# Patient Record
Sex: Female | Born: 1940 | Race: White | Hispanic: No | Marital: Married | Smoking: Never smoker
Health system: Southern US, Community
[De-identification: ages and names within clinical notes are randomized; demographics above are authoritative.]

## PROBLEM LIST (undated history)

## (undated) DIAGNOSIS — F419 Anxiety disorder, unspecified: Secondary | ICD-10-CM

## (undated) DIAGNOSIS — R053 Chronic cough: Secondary | ICD-10-CM

## (undated) DIAGNOSIS — R112 Nausea with vomiting, unspecified: Secondary | ICD-10-CM

## (undated) DIAGNOSIS — Z9889 Other specified postprocedural states: Secondary | ICD-10-CM

## (undated) DIAGNOSIS — G43909 Migraine, unspecified, not intractable, without status migrainosus: Secondary | ICD-10-CM

## (undated) DIAGNOSIS — I82409 Acute embolism and thrombosis of unspecified deep veins of unspecified lower extremity: Secondary | ICD-10-CM

## (undated) DIAGNOSIS — T8859XA Other complications of anesthesia, initial encounter: Secondary | ICD-10-CM

## (undated) DIAGNOSIS — Z939 Artificial opening status, unspecified: Secondary | ICD-10-CM

## (undated) DIAGNOSIS — K5792 Diverticulitis of intestine, part unspecified, without perforation or abscess without bleeding: Secondary | ICD-10-CM

## (undated) DIAGNOSIS — K8020 Calculus of gallbladder without cholecystitis without obstruction: Secondary | ICD-10-CM

## (undated) DIAGNOSIS — J189 Pneumonia, unspecified organism: Secondary | ICD-10-CM

## (undated) DIAGNOSIS — M719 Bursopathy, unspecified: Secondary | ICD-10-CM

## (undated) DIAGNOSIS — L659 Nonscarring hair loss, unspecified: Secondary | ICD-10-CM

## (undated) DIAGNOSIS — M1990 Unspecified osteoarthritis, unspecified site: Secondary | ICD-10-CM

## (undated) DIAGNOSIS — O020 Blighted ovum and nonhydatidiform mole: Secondary | ICD-10-CM

## (undated) DIAGNOSIS — K219 Gastro-esophageal reflux disease without esophagitis: Secondary | ICD-10-CM

## (undated) DIAGNOSIS — M810 Age-related osteoporosis without current pathological fracture: Secondary | ICD-10-CM

## (undated) DIAGNOSIS — C4490 Unspecified malignant neoplasm of skin, unspecified: Secondary | ICD-10-CM

## (undated) DIAGNOSIS — N281 Cyst of kidney, acquired: Secondary | ICD-10-CM

## (undated) DIAGNOSIS — K439 Ventral hernia without obstruction or gangrene: Secondary | ICD-10-CM

## (undated) DIAGNOSIS — C801 Malignant (primary) neoplasm, unspecified: Secondary | ICD-10-CM

## (undated) DIAGNOSIS — J342 Deviated nasal septum: Secondary | ICD-10-CM

## (undated) DIAGNOSIS — Z87442 Personal history of urinary calculi: Secondary | ICD-10-CM

## (undated) DIAGNOSIS — H938X3 Other specified disorders of ear, bilateral: Secondary | ICD-10-CM

## (undated) DIAGNOSIS — T4145XA Adverse effect of unspecified anesthetic, initial encounter: Secondary | ICD-10-CM

## (undated) DIAGNOSIS — R05 Cough: Secondary | ICD-10-CM

## (undated) DIAGNOSIS — B009 Herpesviral infection, unspecified: Secondary | ICD-10-CM

## (undated) DIAGNOSIS — E559 Vitamin D deficiency, unspecified: Secondary | ICD-10-CM

## (undated) DIAGNOSIS — Z85828 Personal history of other malignant neoplasm of skin: Secondary | ICD-10-CM

## (undated) HISTORY — DX: Nonscarring hair loss, unspecified: L65.9

## (undated) HISTORY — PX: LIPOMA RESECTION: SHX23

## (undated) HISTORY — PX: TONSILLECTOMY: SUR1361

## (undated) HISTORY — DX: Artificial opening status, unspecified: Z93.9

## (undated) HISTORY — DX: Vitamin D deficiency, unspecified: E55.9

## (undated) HISTORY — PX: EYE SURGERY: SHX253

## (undated) HISTORY — DX: Herpesviral infection, unspecified: B00.9

## (undated) HISTORY — DX: Unspecified osteoarthritis, unspecified site: M19.90

## (undated) HISTORY — DX: Bursopathy, unspecified: M71.9

## (undated) HISTORY — DX: Blighted ovum and nonhydatidiform mole: O02.0

## (undated) HISTORY — DX: Migraine, unspecified, not intractable, without status migrainosus: G43.909

## (undated) HISTORY — DX: Acute embolism and thrombosis of unspecified deep veins of unspecified lower extremity: I82.409

---

## 1970-03-11 HISTORY — PX: ABDOMINAL HYSTERECTOMY: SHX81

## 1974-03-11 HISTORY — PX: OOPHORECTOMY: SHX86

## 1983-03-12 HISTORY — PX: RHINOPLASTY: SUR1284

## 1992-03-11 HISTORY — PX: REDUCTION MAMMAPLASTY: SUR839

## 1994-07-26 ENCOUNTER — Encounter: Payer: Self-pay | Admitting: Internal Medicine

## 1997-09-19 ENCOUNTER — Ambulatory Visit (HOSPITAL_COMMUNITY): Admission: RE | Admit: 1997-09-19 | Discharge: 1997-09-19 | Payer: Self-pay | Admitting: Gynecology

## 1997-10-04 ENCOUNTER — Other Ambulatory Visit: Admission: RE | Admit: 1997-10-04 | Discharge: 1997-10-04 | Payer: Self-pay | Admitting: Gynecology

## 1998-11-21 ENCOUNTER — Ambulatory Visit (HOSPITAL_COMMUNITY): Admission: RE | Admit: 1998-11-21 | Discharge: 1998-11-21 | Payer: Self-pay | Admitting: Gynecology

## 1998-11-21 ENCOUNTER — Encounter: Payer: Self-pay | Admitting: Gynecology

## 1998-12-11 ENCOUNTER — Other Ambulatory Visit: Admission: RE | Admit: 1998-12-11 | Discharge: 1998-12-11 | Payer: Self-pay | Admitting: Gynecology

## 1998-12-11 ENCOUNTER — Encounter: Payer: Self-pay | Admitting: Internal Medicine

## 1999-08-04 ENCOUNTER — Encounter: Admission: RE | Admit: 1999-08-04 | Discharge: 1999-08-04 | Payer: Self-pay | Admitting: Orthopedic Surgery

## 1999-08-04 ENCOUNTER — Encounter: Payer: Self-pay | Admitting: Orthopedic Surgery

## 1999-08-10 ENCOUNTER — Encounter (INDEPENDENT_AMBULATORY_CARE_PROVIDER_SITE_OTHER): Payer: Self-pay | Admitting: *Deleted

## 1999-08-10 ENCOUNTER — Ambulatory Visit (HOSPITAL_BASED_OUTPATIENT_CLINIC_OR_DEPARTMENT_OTHER): Admission: RE | Admit: 1999-08-10 | Discharge: 1999-08-10 | Payer: Self-pay | Admitting: Plastic Surgery

## 1999-10-11 ENCOUNTER — Encounter: Admission: RE | Admit: 1999-10-11 | Discharge: 1999-10-11 | Payer: Self-pay | Admitting: Neurology

## 1999-10-11 ENCOUNTER — Encounter: Payer: Self-pay | Admitting: Neurology

## 2000-01-28 ENCOUNTER — Ambulatory Visit (HOSPITAL_COMMUNITY): Admission: RE | Admit: 2000-01-28 | Discharge: 2000-01-28 | Payer: Self-pay | Admitting: Gynecology

## 2000-01-28 ENCOUNTER — Encounter: Payer: Self-pay | Admitting: Gynecology

## 2000-02-21 ENCOUNTER — Other Ambulatory Visit: Admission: RE | Admit: 2000-02-21 | Discharge: 2000-02-21 | Payer: Self-pay | Admitting: Gynecology

## 2001-01-30 ENCOUNTER — Ambulatory Visit (HOSPITAL_COMMUNITY): Admission: RE | Admit: 2001-01-30 | Discharge: 2001-01-30 | Payer: Self-pay | Admitting: Gynecology

## 2001-01-30 ENCOUNTER — Encounter: Payer: Self-pay | Admitting: Gynecology

## 2001-04-07 ENCOUNTER — Other Ambulatory Visit: Admission: RE | Admit: 2001-04-07 | Discharge: 2001-04-07 | Payer: Self-pay | Admitting: Gynecology

## 2001-05-11 ENCOUNTER — Encounter: Payer: Self-pay | Admitting: Gastroenterology

## 2001-05-11 ENCOUNTER — Ambulatory Visit (HOSPITAL_COMMUNITY): Admission: RE | Admit: 2001-05-11 | Discharge: 2001-05-11 | Payer: Self-pay | Admitting: Gastroenterology

## 2001-07-13 ENCOUNTER — Encounter: Payer: Self-pay | Admitting: Internal Medicine

## 2002-03-01 ENCOUNTER — Ambulatory Visit (HOSPITAL_COMMUNITY): Admission: RE | Admit: 2002-03-01 | Discharge: 2002-03-01 | Payer: Self-pay | Admitting: Gynecology

## 2002-03-01 ENCOUNTER — Encounter: Payer: Self-pay | Admitting: Gynecology

## 2002-06-30 ENCOUNTER — Other Ambulatory Visit: Admission: RE | Admit: 2002-06-30 | Discharge: 2002-06-30 | Payer: Self-pay | Admitting: Gynecology

## 2003-04-27 ENCOUNTER — Ambulatory Visit (HOSPITAL_COMMUNITY): Admission: RE | Admit: 2003-04-27 | Discharge: 2003-04-27 | Payer: Self-pay | Admitting: Gynecology

## 2003-05-18 ENCOUNTER — Encounter: Admission: RE | Admit: 2003-05-18 | Discharge: 2003-08-16 | Payer: Self-pay

## 2003-08-15 ENCOUNTER — Other Ambulatory Visit: Admission: RE | Admit: 2003-08-15 | Discharge: 2003-08-15 | Payer: Self-pay | Admitting: Gynecology

## 2003-08-24 ENCOUNTER — Emergency Department (HOSPITAL_COMMUNITY): Admission: EM | Admit: 2003-08-24 | Discharge: 2003-08-24 | Payer: Self-pay | Admitting: Emergency Medicine

## 2003-09-21 ENCOUNTER — Encounter: Payer: Self-pay | Admitting: Internal Medicine

## 2004-06-11 ENCOUNTER — Ambulatory Visit (HOSPITAL_COMMUNITY): Admission: RE | Admit: 2004-06-11 | Discharge: 2004-06-11 | Payer: Self-pay | Admitting: Gynecology

## 2004-10-02 ENCOUNTER — Other Ambulatory Visit: Admission: RE | Admit: 2004-10-02 | Discharge: 2004-10-02 | Payer: Self-pay | Admitting: Gynecology

## 2005-06-20 ENCOUNTER — Ambulatory Visit (HOSPITAL_COMMUNITY): Admission: RE | Admit: 2005-06-20 | Discharge: 2005-06-20 | Payer: Self-pay | Admitting: Gynecology

## 2005-10-15 ENCOUNTER — Other Ambulatory Visit: Admission: RE | Admit: 2005-10-15 | Discharge: 2005-10-15 | Payer: Self-pay | Admitting: Gynecology

## 2005-12-16 ENCOUNTER — Encounter: Payer: Self-pay | Admitting: Internal Medicine

## 2006-07-18 ENCOUNTER — Ambulatory Visit: Payer: Self-pay | Admitting: Internal Medicine

## 2006-08-21 ENCOUNTER — Ambulatory Visit (HOSPITAL_COMMUNITY): Admission: RE | Admit: 2006-08-21 | Discharge: 2006-08-21 | Payer: Self-pay | Admitting: Gynecology

## 2006-09-19 ENCOUNTER — Ambulatory Visit: Payer: Self-pay | Admitting: Internal Medicine

## 2006-10-21 ENCOUNTER — Other Ambulatory Visit: Admission: RE | Admit: 2006-10-21 | Discharge: 2006-10-21 | Payer: Self-pay | Admitting: Gynecology

## 2006-11-11 ENCOUNTER — Ambulatory Visit: Payer: Self-pay | Admitting: Internal Medicine

## 2006-11-12 ENCOUNTER — Ambulatory Visit: Payer: Self-pay | Admitting: Internal Medicine

## 2006-11-12 ENCOUNTER — Encounter: Payer: Self-pay | Admitting: Internal Medicine

## 2006-11-12 DIAGNOSIS — M810 Age-related osteoporosis without current pathological fracture: Secondary | ICD-10-CM | POA: Insufficient documentation

## 2006-11-12 LAB — CONVERTED CEMR LAB
ALT: 10 units/L (ref 0–35)
AST: 15 units/L (ref 0–37)
Alkaline Phosphatase: 77 units/L (ref 39–117)
Basophils Relative: 0.3 % (ref 0.0–1.0)
Bilirubin Urine: NEGATIVE
Bilirubin, Direct: 0.1 mg/dL (ref 0.0–0.3)
CO2: 31 meq/L (ref 19–32)
Cholesterol: 184 mg/dL (ref 0–200)
Creatinine, Ser: 0.7 mg/dL (ref 0.4–1.2)
Crystals: NEGATIVE
GFR calc Af Amer: 108 mL/min
GFR calc non Af Amer: 89 mL/min
HCT: 40.4 % (ref 36.0–46.0)
HDL: 58.8 mg/dL (ref >39.0)
Ketones, ur: NEGATIVE mg/dL
MCV: 87.5 fL (ref 78.0–100.0)
Monocytes Absolute: 0.7 10*3/uL (ref 0.2–0.7)
Monocytes Relative: 8.5 % (ref 3.0–11.0)
Mucus, UA: NEGATIVE
Neutrophils Relative %: 66.5 % (ref 43.0–77.0)
Nitrite: NEGATIVE
Platelets: 359 10*3/uL (ref 150–400)
RBC: 4.61 M/uL (ref 3.87–5.11)
RDW: 13.7 % (ref 11.5–14.6)
Specific Gravity, Urine: 1.015 (ref 1.000–1.03)
TSH: 3.3 microintl units/mL (ref 0.35–5.50)
Triglycerides: 95 mg/dL (ref 0–149)
Urine Glucose: NEGATIVE mg/dL
VLDL: 19 mg/dL (ref 0–40)
pH: 6 (ref 5.0–8.0)

## 2006-11-24 ENCOUNTER — Encounter: Payer: Self-pay | Admitting: Internal Medicine

## 2006-11-24 ENCOUNTER — Ambulatory Visit: Payer: Self-pay | Admitting: Internal Medicine

## 2007-03-12 HISTORY — PX: FACIAL COSMETIC SURGERY: SHX629

## 2007-03-25 ENCOUNTER — Encounter: Payer: Self-pay | Admitting: Internal Medicine

## 2007-03-31 ENCOUNTER — Encounter: Payer: Self-pay | Admitting: Internal Medicine

## 2007-03-31 DIAGNOSIS — M1990 Unspecified osteoarthritis, unspecified site: Secondary | ICD-10-CM | POA: Insufficient documentation

## 2007-03-31 DIAGNOSIS — Z87898 Personal history of other specified conditions: Secondary | ICD-10-CM | POA: Insufficient documentation

## 2007-03-31 DIAGNOSIS — J309 Allergic rhinitis, unspecified: Secondary | ICD-10-CM | POA: Insufficient documentation

## 2007-04-01 ENCOUNTER — Ambulatory Visit: Payer: Self-pay | Admitting: Internal Medicine

## 2007-04-01 DIAGNOSIS — R259 Unspecified abnormal involuntary movements: Secondary | ICD-10-CM | POA: Insufficient documentation

## 2007-04-01 DIAGNOSIS — E559 Vitamin D deficiency, unspecified: Secondary | ICD-10-CM | POA: Insufficient documentation

## 2007-04-01 DIAGNOSIS — L659 Nonscarring hair loss, unspecified: Secondary | ICD-10-CM | POA: Insufficient documentation

## 2007-04-01 LAB — CONVERTED CEMR LAB
TSH: 1.39 microintl units/mL (ref 0.35–5.50)
Vit D, 1,25-Dihydroxy: 32 (ref 30–89)

## 2007-04-02 ENCOUNTER — Encounter: Payer: Self-pay | Admitting: Internal Medicine

## 2007-04-04 ENCOUNTER — Encounter: Payer: Self-pay | Admitting: Internal Medicine

## 2007-04-09 ENCOUNTER — Encounter: Payer: Self-pay | Admitting: Internal Medicine

## 2007-04-17 ENCOUNTER — Encounter: Payer: Self-pay | Admitting: Internal Medicine

## 2007-04-21 ENCOUNTER — Encounter: Payer: Self-pay | Admitting: Internal Medicine

## 2007-08-27 ENCOUNTER — Ambulatory Visit (HOSPITAL_COMMUNITY): Admission: RE | Admit: 2007-08-27 | Discharge: 2007-08-27 | Payer: Self-pay | Admitting: Gynecology

## 2007-10-05 ENCOUNTER — Encounter: Payer: Self-pay | Admitting: Internal Medicine

## 2007-10-30 ENCOUNTER — Other Ambulatory Visit: Admission: RE | Admit: 2007-10-30 | Discharge: 2007-10-30 | Payer: Self-pay | Admitting: Gynecology

## 2007-11-02 ENCOUNTER — Encounter: Payer: Self-pay | Admitting: Internal Medicine

## 2007-11-09 ENCOUNTER — Telehealth: Payer: Self-pay | Admitting: Internal Medicine

## 2007-11-10 ENCOUNTER — Encounter: Payer: Self-pay | Admitting: Internal Medicine

## 2007-12-14 ENCOUNTER — Encounter: Payer: Self-pay | Admitting: Internal Medicine

## 2008-01-08 ENCOUNTER — Ambulatory Visit: Payer: Self-pay | Admitting: Internal Medicine

## 2008-01-08 LAB — CONVERTED CEMR LAB
ALT: 13 units/L (ref 0–35)
Albumin: 3.5 g/dL (ref 3.5–5.2)
Basophils Relative: 2.1 % (ref 0.0–3.0)
Bilirubin, Direct: 0.1 mg/dL (ref 0.0–0.3)
CO2: 31 meq/L (ref 19–32)
Cholesterol: 185 mg/dL (ref 0–200)
GFR calc non Af Amer: 76 mL/min
Glucose, Bld: 109 mg/dL — ABNORMAL HIGH (ref 70–99)
Hemoglobin: 14.1 g/dL (ref 12.0–15.0)
Lymphocytes Relative: 23.5 % (ref 12.0–46.0)
MCHC: 34.4 g/dL (ref 30.0–36.0)
MCV: 89.1 fL (ref 78.0–100.0)
Monocytes Absolute: 0.4 10*3/uL (ref 0.1–1.0)
Neutro Abs: 4.1 10*3/uL (ref 1.4–7.7)
Potassium: 4.3 meq/L (ref 3.5–5.1)
RBC: 4.62 M/uL (ref 3.87–5.11)
RDW: 13 % (ref 11.5–14.6)
Sodium: 142 meq/L (ref 135–145)
Total CHOL/HDL Ratio: 2.9
Total Protein: 7.2 g/dL (ref 6.0–8.3)
Triglycerides: 67 mg/dL (ref 0–149)
VLDL: 13 mg/dL (ref 0–40)
Vit D, 1,25-Dihydroxy: 44 (ref 30–89)
WBC: 6.3 10*3/uL (ref 4.5–10.5)

## 2008-01-12 ENCOUNTER — Telehealth: Payer: Self-pay | Admitting: Internal Medicine

## 2008-01-21 ENCOUNTER — Encounter: Payer: Self-pay | Admitting: Internal Medicine

## 2008-01-28 ENCOUNTER — Ambulatory Visit: Payer: Self-pay | Admitting: Internal Medicine

## 2008-01-28 DIAGNOSIS — Z87448 Personal history of other diseases of urinary system: Secondary | ICD-10-CM | POA: Insufficient documentation

## 2008-01-28 LAB — CONVERTED CEMR LAB
Alkaline Phosphatase: 72 units/L (ref 39–117)
Basophils Absolute: 0 10*3/uL (ref 0.0–0.1)
Basophils Relative: 0.4 % (ref 0.0–3.0)
Bilirubin, Direct: 0.1 mg/dL (ref 0.0–0.3)
Calcium: 9.4 mg/dL (ref 8.4–10.5)
Cholesterol: 190 mg/dL (ref 0–200)
Eosinophils Absolute: 0.2 10*3/uL (ref 0.0–0.7)
GFR calc Af Amer: 80 mL/min
Hemoglobin: 14.4 g/dL (ref 12.0–15.0)
Lymphocytes Relative: 16.5 % (ref 12.0–46.0)
MCHC: 34.9 g/dL (ref 30.0–36.0)
Neutro Abs: 6.2 10*3/uL (ref 1.4–7.7)
Platelets: 247 10*3/uL (ref 150–400)
RBC: 4.63 M/uL (ref 3.87–5.11)
RDW: 13.2 % (ref 11.5–14.6)
TSH: 2.37 microintl units/mL (ref 0.35–5.50)
Triglycerides: 158 mg/dL — ABNORMAL HIGH (ref 0–149)

## 2008-05-03 ENCOUNTER — Telehealth: Payer: Self-pay | Admitting: Internal Medicine

## 2008-05-31 ENCOUNTER — Telehealth (INDEPENDENT_AMBULATORY_CARE_PROVIDER_SITE_OTHER): Payer: Self-pay | Admitting: *Deleted

## 2008-09-26 ENCOUNTER — Ambulatory Visit (HOSPITAL_COMMUNITY): Admission: RE | Admit: 2008-09-26 | Discharge: 2008-09-26 | Payer: Self-pay | Admitting: Gynecology

## 2009-02-09 ENCOUNTER — Ambulatory Visit: Payer: Self-pay | Admitting: Internal Medicine

## 2009-02-09 LAB — CONVERTED CEMR LAB
BUN: 28 mg/dL — ABNORMAL HIGH (ref 6–23)
Chloride: 105 meq/L (ref 96–112)
Eosinophils Absolute: 0.2 10*3/uL (ref 0.0–0.7)
Eosinophils Relative: 1.9 % (ref 0.0–5.0)
Glucose, Bld: 88 mg/dL (ref 70–99)
HCT: 41.3 % (ref 36.0–46.0)
Hemoglobin: 13.7 g/dL (ref 12.0–15.0)
Lymphocytes Relative: 17.4 % (ref 12.0–46.0)
Lymphs Abs: 1.5 10*3/uL (ref 0.7–4.0)
MCHC: 33.1 g/dL (ref 30.0–36.0)
Neutrophils Relative %: 76.8 % (ref 43.0–77.0)
Platelets: 258 10*3/uL (ref 150.0–400.0)
RDW: 13.4 % (ref 11.5–14.6)
Vit D, 25-Hydroxy: 40 ng/mL (ref 30–89)
WBC: 8.5 10*3/uL (ref 4.5–10.5)

## 2009-02-17 ENCOUNTER — Ambulatory Visit: Payer: Self-pay | Admitting: Internal Medicine

## 2009-02-20 ENCOUNTER — Telehealth: Payer: Self-pay | Admitting: Internal Medicine

## 2009-04-03 ENCOUNTER — Encounter: Payer: Self-pay | Admitting: Internal Medicine

## 2009-04-10 ENCOUNTER — Telehealth: Payer: Self-pay | Admitting: Internal Medicine

## 2009-08-21 ENCOUNTER — Ambulatory Visit: Payer: Self-pay | Admitting: Internal Medicine

## 2009-08-22 ENCOUNTER — Encounter: Admission: RE | Admit: 2009-08-22 | Discharge: 2009-08-22 | Payer: Self-pay | Admitting: Internal Medicine

## 2009-08-23 ENCOUNTER — Telehealth: Payer: Self-pay | Admitting: Internal Medicine

## 2009-12-05 ENCOUNTER — Encounter: Payer: Self-pay | Admitting: Internal Medicine

## 2009-12-19 ENCOUNTER — Ambulatory Visit: Payer: Self-pay | Admitting: Internal Medicine

## 2009-12-19 LAB — CONVERTED CEMR LAB
AST: 17 units/L (ref 0–37)
Albumin: 3.6 g/dL (ref 3.5–5.2)
Alkaline Phosphatase: 62 units/L (ref 39–117)
BUN: 32 mg/dL — ABNORMAL HIGH (ref 6–23)
Bilirubin Urine: NEGATIVE
Bilirubin, Direct: 0.1 mg/dL (ref 0.0–0.3)
Eosinophils Relative: 1.3 % (ref 0.0–5.0)
GFR calc non Af Amer: 72.44 mL/min (ref >60)
LDL Cholesterol: 105 mg/dL — ABNORMAL HIGH (ref 0–99)
Leukocytes, UA: NEGATIVE
Lymphocytes Relative: 25 % (ref 12.0–46.0)
Monocytes Relative: 6.1 % (ref 3.0–12.0)
Neutro Abs: 4.5 10*3/uL (ref 1.4–7.7)
Neutrophils Relative %: 65.8 % (ref 43.0–77.0)
Nitrite: NEGATIVE
Platelets: 245 10*3/uL (ref 150.0–400.0)
Potassium: 5.1 meq/L (ref 3.5–5.1)
Sodium: 139 meq/L (ref 135–145)
Specific Gravity, Urine: 1.025 (ref 1.000–1.030)
Total Bilirubin: 0.7 mg/dL (ref 0.3–1.2)
Total CHOL/HDL Ratio: 3
Total Protein, Urine: NEGATIVE mg/dL
Urobilinogen, UA: 0.2 (ref 0.0–1.0)
pH: 5 (ref 5.0–8.0)

## 2009-12-25 ENCOUNTER — Ambulatory Visit: Payer: Self-pay | Admitting: Internal Medicine

## 2009-12-25 ENCOUNTER — Encounter: Payer: Self-pay | Admitting: Internal Medicine

## 2009-12-26 ENCOUNTER — Telehealth: Payer: Self-pay | Admitting: Internal Medicine

## 2009-12-28 ENCOUNTER — Telehealth (INDEPENDENT_AMBULATORY_CARE_PROVIDER_SITE_OTHER): Payer: Self-pay | Admitting: *Deleted

## 2010-01-03 ENCOUNTER — Telehealth: Payer: Self-pay | Admitting: Internal Medicine

## 2010-01-18 ENCOUNTER — Encounter: Payer: Self-pay | Admitting: Internal Medicine

## 2010-02-06 ENCOUNTER — Encounter: Payer: Self-pay | Admitting: Internal Medicine

## 2010-03-22 ENCOUNTER — Encounter
Admission: RE | Admit: 2010-03-22 | Discharge: 2010-03-22 | Payer: Self-pay | Source: Home / Self Care | Attending: Neurosurgery | Admitting: Neurosurgery

## 2010-04-10 NOTE — Letter (Signed)
Summary: [facility]  [facility]   Imported By: Lennie Odor 01/23/2010 13:55:31  _____________________________________________________________________  External Attachment:    Type:   Image     Comment:   External Document

## 2010-04-10 NOTE — Assessment & Plan Note (Signed)
Summary: recurring back pain--stc   Vital Signs:  Patient profile:   70 year old female Height:      61 inches Weight:      116 pounds BMI:     22.00 O2 Sat:      96 % on Room air Temp:     96.9 degrees F oral Pulse rate:   85 / minute BP sitting:   114 / 62  (left arm) Cuff size:   regular  Vitals Entered By: Bill Salinas CMA (August 21, 2009 11:11 AM)  O2 Flow:  Room air CC: pt here with c/o nagging irratation on the right side of her back, she has noticed it about 2 weeks/ ab   Primary Care Provider:  Jacques Navy MD  CC:  pt here with c/o nagging irratation on the right side of her back and she has noticed it about 2 weeks/ ab.  History of Present Illness: Patient presents for a two week h/o fullness and discomfort in the right flank. she has had no fever, chills, dysuria, hematuria or other acute symptoms. She has had no weight loss or night sweats. She is concerned for cancer or other serious illness as a cause of her discomfort.   Current Medications (verified): 1)  Fosamax 70 Mg  Tabs (Alendronate Sodium) .... Weekly 2)  Tums 500 Mg Chew (Calcium Carbonate Antacid) .... Daily 3)  Vitamin D 1000 Unit Tabs (Cholecalciferol) .... Take 1 Tablet By Mouth Once A Day 4)  Lomotil 2.5-0.025 Mg Tabs (Diphenoxylate-Atropine) .... Take One Tablet By Mouth After Each Loose Stool, Limit of 4 Doses in 24 Hours  Allergies (verified): 1)  ! Codeine 2)  ! Sulfa  Past History:  Past Medical History: Last updated: 01/28/2008 HEMATURIA, MICROSCOPIC, HX OF (ICD-V13.09) TREMOR (ICD-781.0) HAIR LOSS (ICD-704.00) UNSPECIFIED VITAMIN D DEFICIENCY (ICD-268.9) MIGRAINES, HX OF (ICD-V13.8) OSTEOPOROSIS (ICD-733.00) OSTEOARTHRITIS (ICD-715.90) ALLERGIC RHINITIS (ICD-477.9) OSTEOPOROSIS NOS (ICD-733.00)  Past Surgical History: Last updated: 02/09/2009 Hysterectomy 1972 Oophorectomy 1976 Tonsillectomy  * EXCISION OF A LIPOMA, LEFT SCAPULAR AREA REDUCTION MAMMOPLASTY, HX OF 1994  (ICD-V15.9) RHINOPLASTY, HX OF 1985 (ICD-V45.89) Face lift '10 (Sheryl Astin MD in  [place]) PSH reviewed for relevance, FH reviewed for relevance  Review of Systems  The patient denies anorexia, fever, weight loss, chest pain, dyspnea on exertion, abdominal pain, melena, hematochezia, muscle weakness, difficulty walking, and enlarged lymph nodes.    Physical Exam  General:  WNWD white female Neck:  supple.   Lungs:  normal respiratory effort and normal breath sounds.   Heart:  normal rate and regular rhythm.   Abdomen:  fullness in the right flank. No palpable mass on exam of the abdomen and no tenderness.  Msk:  normal ROM.   Pulses:  2+ radial Neurologic:  alert & oriented X3 and gait normal.   Skin:  turgor normal and color normal.   Psych:  Oriented X3, normally interactive, and good eye contact.     Impression & Recommendations:  Problem # 1:  FLANK PAIN, RIGHT (ICD-789.09) Unremarkable exam and  her history does not favor nephrolithiasis.  Plan - r/o tumor or anatomic changes with renal U/S  Orders: Radiology Referral (Radiology)  Complete Medication List: 1)  Fosamax 70 Mg Tabs (Alendronate sodium) .... Weekly 2)  Tums 500 Mg Chew (Calcium carbonate antacid) .... Daily 3)  Vitamin D 1000 Unit Tabs (Cholecalciferol) .... Take 1 tablet by mouth once a day 4)  Lomotil 2.5-0.025 Mg Tabs (Diphenoxylate-atropine) .... Take one tablet by  mouth after each loose stool, limit of 4 doses in 24 hours

## 2010-04-10 NOTE — Progress Notes (Signed)
  Phone Note Call from Patient   Reason for Call: Acute Illness Summary of Call: Patient had a bout of constipation that was slow to resolve despite golytely and other laxatives. she is still having an abnormal stool: small hard stools. Chart reviewed: last colonoscopy Sept '08 - normal. She is reassured that this is not a sign of any gastric issue (mother had gastric cancer) and it is unlikely, with recent normal colonoscopy, to represent any colon pathology.  Rec: hydration, bulk laxative or daily dose of MOM until bowel habit back to normal.  Initial call taken by: Jacques Navy MD,  April 10, 2009 10:04 AM

## 2010-04-10 NOTE — Assessment & Plan Note (Signed)
Summary: YEARLY MEDICARE FU/ LABS AFTER/NWS  #   Vital Signs:  Patient profile:   70 year old female Height:      61 inches Weight:      116 pounds BMI:     22.00 O2 Sat:      97 % on Room air Temp:     96.7 degrees F oral Pulse rate:   68 / minute BP sitting:   122 / 68  (left arm) Cuff size:   regular  Vitals Entered By: Kristen Castillo CMA (December 25, 2009 10:12 AM)  O2 Flow:  Room air CC: Yearly/ ab Comments Pt would like to discuss prescription for Valium. she is no longer taking fosamax/ ab  Vision Screening:      Vision Comments: eye exam sch Thurs Oct 20,2011   Primary Care Provider:  Jacques Navy MD  CC:  Kristen Castillo.  History of Present Illness: Patient present for annual medicare wellness exam. She thinks that she is good with no particular problems  She has DXA which reveal osteoporosis of the femoral necks bilaterally. This is progressive disease. She has been on biphosphonate for 16 years and this has now been stopped by Dr.Meser. She has normal vitamin D level. She has no endocrine history. She has no fracture history.   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   Preventive Screening-Counseling & Management  Alcohol-Tobacco     Alcohol drinks/day: 0     Smoking Status: never  Caffeine-Diet-Exercise     Caffeine use/day: no     Diet Comments: heart healthy dietno     Does Patient Exercise: yes     Type of exercise: Zumba     Exercise (avg: min/session): 30-60     Times/week: 7     MSH Depression Score: no depression  Hep-HIV-STD-Contraception     Hepatitis Risk: no risk noted     HIV Risk: no risk noted     STD Risk: no risk noted     Dental Visit-last 6 months yes     Sun Exposure-Excessive: no  Safety-Violence-Falls     Seat Belt Use: yes     Firearms in the Home: firearms in the home     Smoke Detectors: yes     Violence in the Home: no risk noted     Sexual  Abuse: no     Fall Risk: Low Fall Risk      Sexual History:  currently monogamous.        Drug Use:  never.        Blood Transfusions:  no.    Allergies: 1)  ! Codeine 2)  ! Sulfa  Past History:  Past Medical History: Last updated: 01/28/2008 HEMATURIA, MICROSCOPIC, HX OF (ICD-V13.09) TREMOR (ICD-781.0) HAIR LOSS (ICD-704.00) UNSPECIFIED VITAMIN D DEFICIENCY (ICD-268.9) MIGRAINES, HX OF (ICD-V13.8) OSTEOPOROSIS (ICD-733.00) OSTEOARTHRITIS (ICD-715.90) ALLERGIC RHINITIS (ICD-477.9) OSTEOPOROSIS NOS (ICD-733.00)  Past Surgical History: Last updated: 02/09/2009 Hysterectomy 1972 Oophorectomy 1976 Tonsillectomy  * EXCISION OF A LIPOMA, LEFT SCAPULAR AREA REDUCTION MAMMOPLASTY, HX OF 1994 (ICD-V15.9) RHINOPLASTY, HX OF 1985 (ICD-V45.89) Face lift '10 Kristen Duck Astin MD in  [place])  Family History: Last updated: 2007/04/14 father - deceased; CAD/CABG, senile dementia mother - leiomyosarcoma of abdomen, arthritis PGF - lung cancer, lipids GM-CVA, PVD sister - DM Neg- breast and colon cancer  Social History: Last updated: 2007-04-14 Kristen Castillo [place]-education married '64 work: Runner, broadcasting/film/video, 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 Life is good  Risk Factors: Alcohol Use: 0 (12/25/2009) Caffeine Use: no (12/25/2009) Diet: heart healthy dietno (12/25/2009) Exercise: yes (12/25/2009)  Risk Factors: Smoking Status: never (12/25/2009)  Social History: Caffeine use/day:  no Does Patient Exercise:  yes Dental Care w/in 6 mos.:  yes Sun Exposure-Excessive:  no Seat Belt Use:  yes Fall Risk:  Low Fall Risk Hepatitis Risk:  no risk noted HIV Risk:  no risk noted STD Risk:  no risk noted Sexual History:  currently monogamous Drug Use:  never Blood Transfusions:  no  Review of Systems  The patient denies anorexia, fever, weight loss, weight gain, decreased hearing, hoarseness, chest pain, syncope, dyspnea  on exertion, prolonged cough, headaches, abdominal pain, severe indigestion/heartburn, muscle weakness, difficulty walking, depression, abnormal bleeding, enlarged lymph nodes, and breast masses.    Physical Exam  General:  Well-developed,well-nourished,in no acute distress; alert,appropriate and cooperative throughout examination Head:  Normocephalic and atraumatic without obvious abnormalities. No apparent alopecia or balding. Eyes:  No corneal or conjunctival inflammation noted. EOMI. Perrla. Funduscopic exam benign, without hemorrhages, exudates or papilledema. Vision grossly normal. Ears:  External ear exam shows no significant lesions or deformities.  Otoscopic examination reveals clear canals, tympanic membranes are intact bilaterally without bulging, retraction, inflammation or discharge. Hearing is grossly normal bilaterally. Nose:  no external deformity and no external erythema.  Visible deviation Mouth:  Oral mucosa and oropharynx without lesions or exudates.  Teeth in good repair. Neck:  supple, full ROM, no thyromegaly, and no carotid bruits.   Chest Wall:  No deformities, masses, or tenderness noted. Breasts:  deferred to gyn Lungs:  Normal respiratory effort, chest expands symmetrically. Lungs are clear to auscultation, no crackles or wheezes. Heart:  Normal rate and regular rhythm. S1 and S2 normal without gallop, murmur, click, rub or other extra sounds. Abdomen:  soft, non-tender, normal bowel sounds, and no guarding.   Genitalia:  deferred to gyn  Msk:  normal ROM, no joint tenderness, no joint swelling, no joint warmth, and no redness over joints.   Pulses:  2+ radial Extremities:  No clubbing, cyanosis, edema, or deformity noted with normal full range of motion of all joints.   Neurologic:  alert & oriented X3, cranial nerves II-XII intact, gait normal, and DTRs symmetrical and normal.   Skin:  turgor normal, color normal, no rashes, and no suspicious lesions.   Cervical  Nodes:  no anterior cervical adenopathy and no posterior cervical adenopathy.   Psych:  Oriented X3, normally interactive, and good eye contact.     Impression & Recommendations:  Problem # 1:  UNSPECIFIED VITAMIN D DEFICIENCY (ICD-268.9) Vitamin D 43 0- normal level.  Plan she can continue with Vit D [phone] international units daily.  Problem # 2:  OSTEOARTHRITIS (ICD-715.90) No limiting inflammation, no restriction in activity  Problem # 3:  OSTEOPOROSIS NOS (ICD-733.00) Assessment: New Patient with osteoporosis of bilateral femoral necks. She was recently taken off Fosamax after greater than 10 years of treatment and progressive bone loss. she wants to be very aggressive about management of her osteoporosis. We discussed both Forteo and Prolia treatments for enhancing bone strength and reducing fracture risk. She is also interested in a endocrine consult to insure that there is not an underlying cause of her bone loss.  Plan - Remain off bisphosphonates           refer to Dr. Leslie Dales - whom she  has seen in the past.  Her updated medication list for this problem includes:    Fosamax 70 Mg Tabs (Alendronate sodium) .Marland Kitchen... Weekly  Orders: Endocrinology Referral (Endocrine)  Problem # 4:  Preventive Health Care (ICD-V70.0)  Interval history in unremarkable except for osteoporosis issues. Her exam, excluding breast and gyn, is normal. Lab results are within normal limits including lipid profile, TSH, Vit D. She is current with her gynecologist. she is current with mammography. Last colonoscopy Sept '08. Immunizations: Tetnus Dec '10, Shingles Dec '10, pneumovax booster today. 12 Lead EKG normal with no signs of strain or ischemia.  In summary - this is a very nice woman who is medically stable. She is encouraged to remain very active. She will continue on her present regimen. An appointment with Dr. Leslie Dales will be scheduled re: management strategy for osteoporosis. She will return  as needed or 1 year.   Orders: Medicare -1st Annual Wellness Visit [phone])  Complete Medication List: 1)  Fosamax 70 Mg Tabs (Alendronate sodium) .... Weekly 2)  Tums 500 Mg Chew (Calcium carbonate antacid) .... Daily 3)  Vitamin D 1000 Unit Tabs (Cholecalciferol) .... Take 1 tablet by mouth once a day 4)  Lomotil 2.5-0.025 Mg Tabs (Diphenoxylate-atropine) .... Take one tablet by mouth after each loose stool, limit of 4 doses in 24 hours  Other Orders: Pneumococcal Vaccine (60454) Admin 1st Vaccine (09811) Flu Vaccine 39yrs + MEDICARE PATIENTS (B1478) Administration Flu vaccine - MCR (G9562)   Patient: Kristen Castillo Note: All result statuses are Final unless otherwise noted.  Tests: (1) BMP (METABOL)   Sodium                    139 mEq/L                   135-145   Potassium                 5.1 mEq/L                   3.5-5.1   Chloride                  105 mEq/L                   96-112   Carbon Dioxide            28 mEq/L                    19-32   Glucose              [H]  101 mg/dL                   13-08   BUN                  [H]  32 mg/dL                    6-57   Creatinine                0.8 mg/dL                   8.4-6.9   Calcium                   9.8 mg/dL                   6.2-95.2   GFR  72.44 mL/min                >60  Tests: (2) CBC Platelet w/Diff (CBCD)   White Cell Count          6.9 K/uL                    4.5-10.5   Red Cell Count            4.61 Mil/uL                 3.87-5.11   Hemoglobin                14.2 g/dL                   16.1-09.6   Hematocrit                41.8 %                      36.0-46.0   MCV                       90.6 fl                     78.0-100.0   MCHC                      33.9 g/dL                   04.5-40.9   RDW                       14.4 %                      11.5-14.6   Platelet Count            245.0 K/uL                  150.0-400.0   Neutrophil %              65.8 %                       43.0-77.0   Lymphocyte %              25.0 %                      12.0-46.0   Monocyte %                6.1 %                       3.0-12.0   Eosinophils%              1.3 %                       0.0-5.0   Basophils %               1.8 %                       0.0-3.0   Neutrophill Absolute      4.5 K/uL                    1.4-7.7   Lymphocyte Absolute  1.7 K/uL                    0.7-4.0   Monocyte Absolute         0.4 K/uL                    0.1-1.0  Eosinophils, Absolute                             0.1 K/uL                    0.0-0.7   Basophils Absolute        0.1 K/uL                    0.0-0.1  Tests: (3) Hepatic/Liver Function Panel (HEPATIC)   Total Bilirubin           0.7 mg/dL                   1.6-1.0   Direct Bilirubin          0.1 mg/dL                   9.6-0.4   Alkaline Phosphatase      62 U/L                      39-117   AST                       17 U/L                      0-37   ALT                       13 U/L                      0-35   Total Protein             7.0 g/dL                    5.4-0.9   Albumin                   3.6 g/dL                    8.1-1.9  Tests: (4) TSH (TSH)   FastTSH                   2.54 uIU/mL                 0.35-5.50  Tests: (5) Lipid Panel (LIPID)   Cholesterol               178 mg/dL                   1-478     ATP III Classification            Desirable:  < 200 mg/dL                    Borderline High:  200 - 239 mg/dL               High:  > = 240 mg/dL   Triglycerides  63.0 mg/dL                  9.6-295.2     Normal:  <150 mg/dL     Borderline High:  841 - 199 mg/dL   HDL                       32.44 mg/dL                 >01.02   VLDL Cholesterol          12.6 mg/dL                  7.2-53.6   LDL Cholesterol      [H]  644 mg/dL                   0-34  CHO/HDL Ratio:  CHD Risk                             3                    Men          Women     1/2 Average Risk     3.4          3.3     Average Risk           5.0          4.4     2X Average Risk          9.6          7.1     3X Average Risk          15.0          11.0                           Tests: (6) UDip Only (UDIP)   Color                     LT. YELLOW       RANGE:  Yellow;Lt. Yellow   Clarity                   CLEAR                       Clear   Specific Gravity          1.025                       1.000 - 1.030   Urine Ph                  5.0                         5.0-8.0   Protein                   NEGATIVE                    Negative   Urine Glucose             NEGATIVE                    Negative   Ketones  NEGATIVE                    Negative   Urine Bilirubin           NEGATIVE                    Negative   Blood                     TRACE-LYSED                 Negative   Urobilinogen              0.2                         0.0 - 1.0   Leukocyte Esterace        NEGATIVE                    Negative   Nitrite                   NEGATIVE                    Negative Tests: (1) Vitamin D (25-Hydroxy) (16109)  Vitamin D (25-Hydroxy)                             43 ng/mL                    30-89  Orders Added: 1)  Endocrinology Referral [Endocrine] 2)  Pneumococcal Vaccine [90732] 3)  Admin 1st Vaccine [90471] 4)  Flu Vaccine 37yrs + MEDICARE PATIENTS [Q2039] 5)  Administration Flu vaccine - MCR [G0008] 6)  Medicare -1st Annual Wellness Visit [G0438] 7)  Est. Patient Level III [60454]   Immunizations Administered:  Pneumonia Vaccine:    Vaccine Type: Pneumovax    Site: right deltoid    Mfr: Merck    Dose: 0.5 ml    Route: IM    Given by: Ami Bullins CMA    Exp. Date: 05/28/2011    Lot #: [id number]    VIS given: 02/13/09 version given December 25, 2009.   Immunizations Administered:  Pneumonia Vaccine:    Vaccine Type: Pneumovax    Site: right deltoid    Mfr: Merck    Dose: 0.5 ml    Route: IM    Given by: Ami Bullins CMA    Exp. Date: 05/28/2011    Lot #: [id number]    VIS given: 02/13/09 version  given December 25, 2009.   Preventive Care Screening  Mammogram:    Date:  12/05/2009    Results:  normal    Preventive Care Screening  Mammogram:    Date:  12/05/2009    Results:  normal       Flu Vaccine Consent Questions     Do you have a history of severe allergic reactions to this vaccine? no    Any prior history of allergic reactions to egg and/or gelatin? no    Do you have a sensitivity to the preservative Thimersol? no    Do you have a past history of Guillan-Barre Syndrome? no    Do you currently have an acute febrile illness? no    Have you ever had a severe reaction to latex? no    Vaccine information given and explained to patient?  yes    Are you currently pregnant? no    Lot Number:[id number]   Exp Date:09/08/2010   Site Given  Left Deltoid IMflu1

## 2010-04-10 NOTE — Letter (Signed)
Summary: Kristen Castillo [name]  Kristen Castillo /[name]   Imported By: Lester Moosup 04/10/2009 09:27:44  _____________________________________________________________________  External Attachment:    Type:   Image     Comment:   External Document

## 2010-04-10 NOTE — Progress Notes (Signed)
Summary: RESULTS  Phone Note Call from Patient   Caller: Dorene Sorrow - Husband - Cell # [phone] Summary of Call: Pt's husband is req results of u/s. Pt is very concerned but feels "fine".  Initial call taken by: Lamar Sprinkles, CMA,  August 23, 2009 9:19 AM  Follow-up for Phone Call        renal u/s reveals simple renal cysts: right 5x4x4cm (2.4 cm/inch = 2"x1.5"x1.5"); left 3x3x3 cm. Totally BENIGN appearance per radiology. No treatment needed  Follow-up by: Jacques Navy MD,  August 23, 2009 11:17 AM  Additional Follow-up for Phone Call Additional follow up Details #1::        Patient spouse notified. Additional Follow-up by: Lucious Groves,  August 23, 2009 1:29 PM

## 2010-04-10 NOTE — Progress Notes (Signed)
  Phone Note Other Incoming   Request: Send information Summary of Call: Records received from [facility]. 2 pages forwarded to Dr. Debby Bud for review.

## 2010-04-10 NOTE — Letter (Signed)
Summary: Forteo note from patient  Forteo note from patient   Imported By: Lester [place] 02/12/2010 10:23:22  _____________________________________________________________________  External Attachment:    Type:   Image     Comment:   External Document

## 2010-04-10 NOTE — Progress Notes (Signed)
Summary: REACTION  Phone Note Call from Patient Call back at Work Phone [phone]   Summary of Call: Patient called stating that she had pneumovax on 12/25/09. Later after injection, the area under the injection site became swollen w/ some pain. She states that she had a very similar reaction in the past. She has been using cold compress and  which has decreased it in size. She would like to know if any addtional advisement is needed. Thanks Initial call taken by: Rock Nephew CMA,  December 26, 2009 11:53 AM  Follow-up for Phone Call        sorry she had rxn. She is doing the right thing.  Follow-up by: Jacques Navy MD,  December 26, 2009 1:03 PM  Additional Follow-up for Phone Call Additional follow up Details #1::        Pt informed  Additional Follow-up by: Lamar Sprinkles, CMA,  December 27, 2009 1:41 PM

## 2010-04-10 NOTE — Progress Notes (Signed)
  Phone Note Refill Request Message from:  Fax from Pharmacy on January 03, 2010 3:42 PM  New prescription for Valium generic that pt wanted. Fax from [facility], Please Advise  Initial call taken by: Ami Bullins CMA,  January 03, 2010 3:42 PM  Follow-up for Phone Call        ok to call in diazepam 2mg  # 30 , sig 1 by mouth q8 as needed, 3 refills Follow-up by: Jacques Navy MD,  January 03, 2010 4:44 PM    New/Updated Medications: DIAZEPAM 2 MG TABS (DIAZEPAM) 1 po q 8 as needed anxiety Prescriptions: DIAZEPAM 2 MG TABS (DIAZEPAM) 1 po q 8 as needed anxiety  #30 x 3   Entered and Authorized by:   Jacques Navy MD   Signed by:   Jacques Navy MD on 01/03/2010   Method used:   Telephoned to ...       [facility]* (retail)       [address]       [place], [place]  [id number]       Ph: [phone]       Fax: [phone]   RxID:   [id number]

## 2010-04-12 NOTE — Consult Note (Signed)
Summary: [facility]  [facility]   Imported By: Lennie Odor 02/26/2010 11:33:47  _____________________________________________________________________  External Attachment:    Type:   Image     Comment:   External Document

## 2010-05-07 ENCOUNTER — Other Ambulatory Visit: Payer: Self-pay | Admitting: Dermatology

## 2010-07-02 ENCOUNTER — Emergency Department (HOSPITAL_COMMUNITY)
Admission: EM | Admit: 2010-07-02 | Discharge: 2010-07-02 | Disposition: A | Discharge status: Home / Self Care | Payer: Medicare Other | Attending: Emergency Medicine | Admitting: Emergency Medicine

## 2010-07-02 ENCOUNTER — Emergency Department (HOSPITAL_COMMUNITY): Payer: Medicare Other

## 2010-07-02 DIAGNOSIS — R112 Nausea with vomiting, unspecified: Secondary | ICD-10-CM | POA: Insufficient documentation

## 2010-07-02 DIAGNOSIS — R1012 Left upper quadrant pain: Secondary | ICD-10-CM | POA: Insufficient documentation

## 2010-07-02 DIAGNOSIS — N200 Calculus of kidney: Secondary | ICD-10-CM | POA: Insufficient documentation

## 2010-07-02 DIAGNOSIS — D72829 Elevated white blood cell count, unspecified: Secondary | ICD-10-CM | POA: Insufficient documentation

## 2010-07-02 DIAGNOSIS — R319 Hematuria, unspecified: Secondary | ICD-10-CM | POA: Insufficient documentation

## 2010-07-02 LAB — URINE MICROSCOPIC-ADD ON

## 2010-07-02 LAB — COMPREHENSIVE METABOLIC PANEL
Albumin: 3.5 g/dL (ref 3.5–5.2)
Alkaline Phosphatase: 86 U/L (ref 39–117)
BUN: 31 mg/dL — ABNORMAL HIGH (ref 6–23)
CO2: 24 mEq/L (ref 19–32)
Chloride: 103 mEq/L (ref 96–112)
Creatinine, Ser: 0.91 mg/dL (ref 0.4–1.2)
GFR calc non Af Amer: >60 mL/min (ref >60)
Glucose, Bld: 183 mg/dL — ABNORMAL HIGH (ref 70–99)
Potassium: 4.9 mEq/L (ref 3.5–5.1)
Total Bilirubin: 1.8 mg/dL — ABNORMAL HIGH (ref 0.3–1.2)

## 2010-07-02 LAB — URINALYSIS, ROUTINE W REFLEX MICROSCOPIC
Nitrite: POSITIVE — AB
Protein, ur: 30 mg/dL — AB

## 2010-07-02 LAB — CBC
HCT: 44.3 % (ref 36.0–46.0)
MCH: 30.6 pg (ref 26.0–34.0)
Platelets: 284 10*3/uL (ref 150–400)
RBC: 5.03 MIL/uL (ref 3.87–5.11)
RDW: 13.3 % (ref 11.5–15.5)
WBC: 26.2 10*3/uL — ABNORMAL HIGH (ref 4.0–10.5)

## 2010-07-02 LAB — DIFFERENTIAL
Band Neutrophils: 6 % (ref 0–10)
Basophils Relative: 0 % (ref 0–1)
Eosinophils Absolute: 0 10*3/uL (ref 0.0–0.7)
Metamyelocytes Relative: 0 %

## 2010-07-03 ENCOUNTER — Telehealth: Payer: Self-pay | Admitting: *Deleted

## 2010-07-03 LAB — URINE CULTURE

## 2010-07-03 NOTE — Telephone Encounter (Signed)
MD called and checked on pt, she was in the ER for kidney stones

## 2010-07-03 NOTE — Telephone Encounter (Signed)
Pt's husband req a call back regarding kidney stones.

## 2010-07-24 NOTE — Assessment & Plan Note (Signed)
[facility]                           PRIMARY CARE OFFICE NOTE   CORLEY, KOHLS                      MRN:          [id number]  DATE:07/18/2006                            DOB:          November 18, 1940    Kristen Castillo is a very pleasant, 70 year old woman, who presents to  establish for ongoing continuity care through the kind referral of Dr.  Victorino Dike.  She has no active complaints at this time.   PAST MEDICAL HISTORY:  Surgical:  1. Tonsillectomy, remote.  2. 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  3. Oophorectomy in 1976.  4. Rhinoplasty in 1985.  5. Reduction mammoplasty in 1994 by Dr. Shon Hough.  6. Excision of a lipoma, left scapular area, by Dr. Orpah Greek, 1980.  Medical:  1. The patient had chickenpox and measles as a child.  2. Migraine headaches remotely, none recently.  3. Osteoporosis diagnosed in '96.  4. Osteoarthritis affecting multiple joints including knees, back, and      hips.  5. A question of seasonal allergy.   CURRENT MEDICATIONS:  1. Fosamax 70 mg weekly.  2. Calcium/Viactiv t.i.d.  3. Vagifem twice weekly.   DRUG ALLERGIES:  CODEINE causes hyper-sedation.   HABITS:  Tobacco:  None.  Alcohol:  None.   FAMILY HISTORY:  Mother had leiomyosarcoma of the abdomen/stomach  requiring surgery and chemotherapy, mother also with arthritis, paternal  grandfather with lung cancer, father had coronary artery disease with  bypass surgery, a grandfather all with hyperlipidemia, a grandmother  with CVA and peripheral vascular disease, a sister with diabetes, and no  history of breast cancer, colon cancer.   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  She is  currently retired.  She has been married  44 years.  She has two adopted  sons, age 70 and 66.  She reports her marriage is in good health, and so  far she is enjoying her retirement.   Last tetanus shot is not known, last pneumonia vaccine was 2004.   CHART REVIEW:  1. Patient is status colonoscopy, was a normal study, May 14, 2001,      with diverticulosis with recommended follow up in 2008.  2. Two-D echo, October 13, 2002, which revealed a normal ventricle with      no evidence of mitral valve prolapse or regurgitation.  3. Stress echocardiogram, August of 2004, which was a normal study      with no evidence of ischemia, no wall motion abnormalities were      noted.  4. Last lab work on October 17, 2002, with a normal chemistry panel      including a glucose of 90, liver functions were normal, and kidney      function normal.  Cholesterol was 181, triglycerides 123, HDL was      62.1, and LDL  was 94, which are all normal values.  5. CT scan of the abdomen and pelvis, May 11, 2001, with multiple      renal cysts and simple hepatic cysts, pelvis showed diffuse wall      thickening of the sigmoid colon.  Otherwise, these were      unremarkable studies.  6. Last chest x-ray in 08-08-1995 with no active disease.  7. Last 12-lead electrocardiogram, October 08, 2002, with a normal sinus      rhythm with no significant abnormalities noted.   REVIEW OF SYSTEMS:  The patient does have some problem with duration  insomnia secondary to recent night sweats.  She had been on hormone  replacement therapy for 34 years since her hysterectomy.  Dr. Chevis Pretty just  recently discontinued her hormones, and therefore she is having some  symptoms of climacteric and estrogen withdrawal.  At this point, she  wishes no medication but will follow this along with the hopes for  improvement.  This is under the care of Dr. Chevis Pretty as noted.  Patient has  had an eye exam in the last 12 months.  No ENT or CARDIOVASCULAR  complaints.  RESPIRATORY:  Significant for  frequent coughing, although  nonproductive.  GI:  Unremarkable, and she is due for colonoscopy.  GU:  Significant for nocturia x1, but otherwise unremarkable.  She is having  some atrophic vaginitis with dryness and discomfort and is using Vagifem  as noted.  MUSCULOSKELETAL:  The patient does have multiple joint pain  and discomfort.  She has been seen and evaluated by Dr. Farris Has at [facility].  She reports she has had multiple x-ray studies,  which are notable only for osteoarthritic-type change.  At this time,  she takes no medication but does remain very active, exercising daily  and taking Spanish dance classes three times a week.  No NEUROLOGIC or  PSYCHIATRIC issues were identified.   PHYSICAL EXAMINATION:  VITAL SIGNS:  Temperature 97.3, blood pressure  112/67, pulse 84, weight 135.  GENERAL APPEARANCE:  This is a very pleasant woman looking younger than  her stated chronologic age.  She has no evidence of acute problems.  No  further exam conducted.   ASSESSMENT AND PLAN:  This is an essentially healthy-appearing woman  with osteoarthritis with no other major medical problems.  She is  suffering with a climacteric, but this is under the management of Dr.  Chevis Pretty.  I have asked to return in four to six weeks for a consolidation  visit to review this record and also for a more thorough physical exam.   We did discuss terminal care issues.  I did not provide information  directly but referred them to [facility]med[facility].org for women's guide to death  with dignity and appropriate documentation.  We will discuss this  further with her on her return visit.     Kristen Gess Norins, MD  Electronically Signed    MEN/MedQ  DD: 07/19/2006  DT: 07/19/2006  Job #: [id number]   cc:   Court Joy

## 2010-07-27 NOTE — Op Note (Signed)
[place]  Patient:    Kristen Castillo, Kristen Castillo                      MRN: [id number] Proc. Date: 08/10/99 Adm. Date:  [id number] Disc. Date: [id number] Attending:  Chapman Moss Dictator:   Teena Irani. Odis Luster, M.D.                           Operative Report  PREOPERATIVE DIAGNOSIS:  Skin lesion of undetermined behavior, right cheek.  POSTOPERATIVE DIAGNOSES: 1. Ski lesion of undetermined behavior, right cheek. 2. Complex open wound of the right cheek 1.2 cm.  PROCEDURE PERFORMED:  Complex wound closure, right cheek, 1.2 cm.  SURGEON:  Dr. Odis Luster.  ANESTHESIA:  Xylocaine 1% with epinephrine plus bicarbonate.  INDICATIONS:  The patient is a 70 year old woman with a lesion on the right cheek that has grown, enlarged, and has changed, and it is felt that it needs to be excised.  This is located on the right cheek very close to the modiolus. The nature of the procedure and risks, including scarring, were discussed with her in great detail.  She wished to proceed.  DESCRIPTION OF PROCEDURE:  The patient was taken to the operating room and placed supine.  The elliptical incision was then marked with the patient opening her mouth widely and placing the lines of incision along the relaxed skin lines.  The area was prepped with Betadine and draped in with sterile drapes.  Subsequently, local anesthesia was achieved.  An elliptical excision was performed.  Good hemostasis was noted.  The layer closure with 5-0 Monocryl interrupted, 5-0 Monocryl interrupted, and a running 6-0 Prolene simple suture.  The antibiotic ointment was applied.  She tolerated the procedure well.  DISPOSITION:  She should keep antibiotic ointment over the wound for the next several days.  We will see her back in the office next week for recheck and suture removal. DD:  08/10/99 TD:  08/14/99 Job: [id number]

## 2010-08-20 ENCOUNTER — Other Ambulatory Visit: Payer: Self-pay | Admitting: Dermatology

## 2011-01-08 ENCOUNTER — Encounter: Payer: Self-pay | Admitting: Internal Medicine

## 2011-01-08 ENCOUNTER — Ambulatory Visit (INDEPENDENT_AMBULATORY_CARE_PROVIDER_SITE_OTHER): Payer: 59 | Admitting: Internal Medicine

## 2011-01-08 DIAGNOSIS — Z87448 Personal history of other diseases of urinary system: Secondary | ICD-10-CM

## 2011-01-08 DIAGNOSIS — J309 Allergic rhinitis, unspecified: Secondary | ICD-10-CM

## 2011-01-08 DIAGNOSIS — M810 Age-related osteoporosis without current pathological fracture: Secondary | ICD-10-CM

## 2011-01-08 DIAGNOSIS — K5730 Diverticulosis of large intestine without perforation or abscess without bleeding: Secondary | ICD-10-CM | POA: Insufficient documentation

## 2011-01-08 DIAGNOSIS — E559 Vitamin D deficiency, unspecified: Secondary | ICD-10-CM

## 2011-01-08 DIAGNOSIS — Z0000 Encounter for general adult medical examination without abnormal findings: Secondary | ICD-10-CM

## 2011-01-08 DIAGNOSIS — N200 Calculus of kidney: Secondary | ICD-10-CM | POA: Insufficient documentation

## 2011-01-08 NOTE — Progress Notes (Signed)
  Subjective:    Patient ID: Kristen Castillo, female    DOB: [birth year], 70 y.o.   MRN: [id number]  HPI Mrs. Borror presents for routine follow-up. She has been continuing with Forteo for almost a year. She just had a vitamin D level checked was 35. She is not taking vitamin D daily on a regular basis.     Review of Systems     Objective:   Physical Exam        Assessment & Plan:

## 2011-01-08 NOTE — Assessment & Plan Note (Signed)
INterval medical history is noted for episode of kidney stone and gross hematuria, otherwise negative. Physical exam, sans breast and pelvic, is normal. Labs from Dr. Corwin Levins reviewed and last lipid panel in 2011 reviewed and normal (see HPI). She is current with pelvic/PAP and breast exam; she reports being current with mammography. She is current for colorectal cancer screening.  Immunizations are up to date.  In summary- a very nice woman who is medically stable and doing well. She will return as needed or in 1 year.

## 2011-01-08 NOTE — Assessment & Plan Note (Signed)
Long standing problem. Is current with urology evaluation.

## 2011-01-08 NOTE — Assessment & Plan Note (Signed)
Recent episode of gross hematuria with CT evidence of stone. She must have passed spontaneously - symptoms resolved. She did see urology in follow-up.

## 2011-01-08 NOTE — Assessment & Plan Note (Signed)
Last level 34 - normal range.  Plan - she is encouraged to take 1000 iu daily routine maintenance

## 2011-01-08 NOTE — Assessment & Plan Note (Signed)
Doing well with Forteo. She had a normal iPTH and calcium level at Dr. Corwin Levins office.  Plan - complete 2 years of treatment and reassess.,            She is already considering prolia for maintenance.

## 2011-01-08 NOTE — Assessment & Plan Note (Signed)
Stable with no significant symptoms at today's visit

## 2011-01-08 NOTE — Assessment & Plan Note (Signed)
Unlikely cause of abdominal discomfort in April. Discussed previous prohibition on nuts and kernals but recent literature does not bear out this prohibition. Therefore, nuts are OK if well masticated.

## 2011-01-08 NOTE — Progress Notes (Signed)
Subjective:    Patient ID: Kristen Castillo, female    DOB: March 15, 1940, 70 y.o.   MRN: [id number]  HPI  The patient is here for annual Medicare wellness examination and management of other chronic and acute problems. She is continuing on forteo without incident. She recently had lab: Vit D 34, iPTH - normal, Calcium - normal, no evidence of hyperparathyroidism, chemistries normal with glucose of 100, normal renal function. Lipid panel from '11 wi/th total of 178, HDL 60, LDL 105. 10 year cardiac event risk is 3% = low risk.   She did have an episode of severe discomfort of substernal pain that radiated to right lower abdomen that was self-limited.    The risk factors are reflected in the social history.  The roster of all physicians providing medical care to patient - is listed in the Snapshot section of the chart.  Activities of daily living:  The patient is 100% inedpendent in all ADLs: dressing, toileting, feeding as well as independent mobility  Home safety : The patient has smoke detectors in the home. They wear seatbelts. fFirearms are present in the home, kept in a safe fashion. There is no violence in the home.   There is no risks for hepatitis, STDs or HIV. There is no   history of blood transfusion. They have no travel history to infectious disease endemic areas of the world.  The patient has seen their dentist in the last six month. They have seen their eye doctor in the last year. They admit to hearing difficulty-looses audible signals in crowds or if there is a lot of background noise and have not had audiologic testing in the last year.  They do not  have excessive sun exposure. Discussed the need for sun protection: hats, long sleeves and use of sunscreen if there is significant sun exposure.   Diet: the importance of a healthy diet is discussed. They do have a healthy (unhealthy-high fat/fast food) diet.  The patient has a regular exercise program: zumba , 1 hour duration, 7  days per week.  The benefits of regular aerobic exercise were discussed.  Depression screen: there are no signs or vegative symptoms of depression- irritability, change in appetite, anhedonia, sadness/tearfullness.  Cognitive assessment: the patient manages all their financial and personal affairs and is actively engaged. They could relate day,date,year and events; recalled 3/3 objects at 3 minutes; performed clock-face test normally.  The following portions of the patient's history were reviewed and updated as appropriate: allergies, current medications, past family history, past medical history,  past surgical history, past social history  and problem list.  Vision, hearing, body mass index were assessed and reviewed.   During the course of the visit the patient was educated and counseled about appropriate screening and preventive services including : fall prevention , diabetes screening, nutrition counseling, colorectal cancer screening, and recommended immunizations.  Past Medical History  Diagnosis Date  . Hematuria   . Tremor   . Hair loss   . Unspecified vitamin D deficiency   . Migraines   . Osteoporosis   . Osteoarthritis   . Allergic rhinitis    Past Surgical History  Procedure Date  . Abdominal hysterectomy     1972  . Oophorectomy     1976  . Tonsillectomy   . Excision of lipoma     left scapula area  . Reduction mammoplasty     1994  . Rhinoplasty     1985  . Facial cosmetic surgery     '  10 Soyla Dryer MD in [place])   Family History  Problem Relation Age of Onset  . Arthritis Mother   . Heart disease Father   . Dementia Father   . Diabetes Sister   . Cancer Maternal Grandfather     Lung cancer   History   Social History  . Marital Status: Married    Spouse Name: N/A    Number of Children: N/A  . Years of Education: N/A   Occupational History  . Not on file.   Social History Main Topics  . Smoking status: Not on file  . Smokeless tobacco: Not on  file  . Alcohol Use:   . Drug Use:   . Sexually Active:    Other Topics Concern  . 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  HEENT:  Negative for hearing loss, ear pain, congestion, neck stiffness and postnasal drip. Negative for sore throat or swallowing problems. Negative for dental complaints.   Eyes: Negative for vision loss or change in visual acuity.  Respiratory: Negative for chest tightness and wheezing. Negative for DOE.   Cardiovascular: Negative for chest pain or palpitations. No decreased exercise tolerance Gastrointestinal: No change in bowel habit. No bloating or gas. No reflux or indigestion Genitourinary: Negative for urgency, frequency, flank pain and difficulty urinating.  Musculoskeletal: Negative for myalgias, back pain, arthralgias and gait problem.  Neurological: Negative for dizziness, tremors, weakness and headaches.  Hematological: Negative for adenopathy.  Psychiatric/Behavioral: Negative for behavioral problems and dysphoric mood.       Objective:   Physical Exam Vitals reviewed - normal Gen'l - well nourished woman who looks younger than her stated age who is no distress HEENT- [place]/AT, C&S clear, PERRLA, fundiscopic exam deferred to opthal. Mild cerumen right EAC, TM normal. Left EAC clear and TM normal. Oropharynx with native dentition in good repair, no buccal or palatal lesions, posterior pharynx clear. Neck - supple, no thyromegaly Nodes - negative cervical and supraclavicular region Chest - no deformity. Lungs - clear to ausculatation and percussion Breast - deferred to gyn Abdomen - soft BS+ x 4, no HSM, no guarding Pelvic- deferred to gyn Ext - no deformity,  no joint swelling or limitation in range of motion Neuro - A&O x 3, CN II-XII normal, motor strength not formerly tested. Derm - clear.          Assessment & Plan:  b

## 2011-01-11 ENCOUNTER — Encounter: Payer: Medicare Other | Admitting: Internal Medicine

## 2011-09-07 ENCOUNTER — Ambulatory Visit (INDEPENDENT_AMBULATORY_CARE_PROVIDER_SITE_OTHER): Payer: Medicare Other | Admitting: Internal Medicine

## 2011-09-07 ENCOUNTER — Encounter: Payer: Self-pay | Admitting: Internal Medicine

## 2011-09-07 VITALS — BP 136/72 | HR 85 | Temp 97.5°F | Wt 128.0 lb

## 2011-09-07 DIAGNOSIS — R198 Other specified symptoms and signs involving the digestive system and abdomen: Secondary | ICD-10-CM

## 2011-09-07 DIAGNOSIS — K5909 Other constipation: Secondary | ICD-10-CM

## 2011-09-07 DIAGNOSIS — K5904 Chronic idiopathic constipation: Secondary | ICD-10-CM | POA: Insufficient documentation

## 2011-09-07 NOTE — Progress Notes (Signed)
  Subjective:    Patient ID: Kristen Castillo, female    DOB: [birth year], 71 y.o.   MRN: [id number]  HPI Here with husband Constipated since Wednesday Trying miralax since then---3 doses in past 24 hours  Concerned because she has noted white mucus when trying to move bowels No stool No blood Some mucus even when not trying to move bowels  Mild RLQ pain that feels like gas Appetite is fine No nausea or vomiting---other than slight nausea when trying to move bowels  No regular problems with constipation Changed to vegan about 1 month ago (largely) Concerned about 10# weight gain on forteo  Normally goes 2-3 times per day  Current Outpatient Prescriptions on File Prior to Visit  Medication Sig Dispense Refill  . cholecalciferol (VITAMIN D) 1000 UNITS tablet Take 1,000 Units by mouth daily.        . diphenoxylate-atropine (LOMOTIL) 2.5-0.025 MG per tablet Take 1 tablet by mouth 4 (four) times daily as needed.          Allergies  Allergen Reactions  . Codeine   . Sulfonamide Derivatives     Past Medical History  Diagnosis Date  . Hematuria   . Tremor   . Hair loss   . Unspecified vitamin D deficiency   . Migraines   . Osteoporosis   . Osteoarthritis   . Allergic rhinitis     Past Surgical History  Procedure Date  . Abdominal hysterectomy     1972  . Oophorectomy     1976  . Tonsillectomy   . Excision of lipoma     left scapula area  . Reduction mammoplasty     1994  . Rhinoplasty     1985  . Facial cosmetic surgery     '10 Tora Duck Astin MD in [place])    Family History  Problem Relation Age of Onset  . Arthritis Mother   . Heart disease Father   . Dementia Father   . Diabetes Sister   . Cancer Maternal Grandfather     Lung cancer    History   Social History  . Marital Status: Married    Spouse Name: N/A    Number of Children: N/A  . Years of Education: N/A   Occupational History  . Not on file.   Social History Main Topics  . Smoking status:  Never Smoker   . Smokeless tobacco: Never Used  . Alcohol Use: No  . Drug Use: No  . Sexually Active: Not on file   Other Topics Concern  . 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 No distress.  Pulmonary/Chest: Effort normal and breath sounds normal. No respiratory distress. She has no wheezes. She has no rales.  Abdominal: Soft. Bowel sounds are normal. She exhibits no distension. There is no tenderness. There is no rebound.  Genitourinary:       Normal rectum No stool in vault No secretions to test No lesions  Psychiatric: She has a normal mood and affect. Her behavior is normal.          Assessment & Plan:

## 2011-09-07 NOTE — Assessment & Plan Note (Signed)
Whitish discharge not associated with sig pain, blood, appetite or weight loss Probably just benign mucus Exam negative reassured

## 2011-09-07 NOTE — Assessment & Plan Note (Signed)
Not typical for her but no warning signs Will be more aggressive with the miralax and use dulcolax suppository if no success

## 2011-09-07 NOTE — Patient Instructions (Signed)
Please continue with the frequent miralax for today If no success by tomorrow, you can try a fleet's enema or dulcolax suppository

## 2011-09-24 ENCOUNTER — Other Ambulatory Visit: Payer: Self-pay | Admitting: Dermatology

## 2011-12-13 ENCOUNTER — Encounter: Payer: Self-pay | Admitting: Internal Medicine

## 2011-12-30 ENCOUNTER — Other Ambulatory Visit: Payer: Self-pay | Admitting: Dermatology

## 2012-02-10 ENCOUNTER — Encounter: Payer: Medicare Other | Admitting: Internal Medicine

## 2012-02-12 ENCOUNTER — Encounter: Payer: Self-pay | Admitting: Internal Medicine

## 2012-02-12 ENCOUNTER — Ambulatory Visit (INDEPENDENT_AMBULATORY_CARE_PROVIDER_SITE_OTHER): Payer: Medicare Other | Admitting: Internal Medicine

## 2012-02-12 VITALS — BP 110/58 | HR 83 | Temp 96.8°F | Resp 10 | Ht 62.0 in | Wt 117.1 lb

## 2012-02-12 DIAGNOSIS — Z0000 Encounter for general adult medical examination without abnormal findings: Secondary | ICD-10-CM

## 2012-02-12 DIAGNOSIS — E559 Vitamin D deficiency, unspecified: Secondary | ICD-10-CM

## 2012-02-12 NOTE — Progress Notes (Signed)
Subjective:    Patient ID: Kristen Castillo, female    DOB: 06/14/1940, 71 y.o.   MRN: [id number]  HPI The patient is here for annual Medicare wellness examination and management of other chronic and acute problems. She has had a good year but has started wearing glasses and has some hearing loss at the conversational frequency (high frequency). She reports having had a Vit D level of 35 at Dr. Corwin Levins office. She will be seeing Dr. Leslie Dales who will be ordering labs.    The risk factors are reflected in the social history.  The roster of all physicians providing medical care to patient - is listed in the Snapshot section of the chart.  Activities of daily living:  The patient is 100% inedpendent in all ADLs: dressing, toileting, feeding as well as independent mobility  Home safety : The patient has smoke detectors in the home. Falls - none. Home is fall safe. They wear seatbelts. Firearm accessible and kept in a safe fashion. There is no violence in the home.   There is no risks for hepatitis, STDs or HIV. There is no history of blood transfusion. They have no travel history to infectious disease endemic areas of the world.  The patient has seen their dentist in the last six month. They have seen their eye doctor in the last year. They admit to hearing difficulty and have not had audiologic testing in the last year.  They do not  have excessive sun exposure. Discussed the need for sun protection: hats, long sleeves and use of sunscreen if there is significant sun exposure.   Diet: the importance of a healthy diet is discussed. They do have a healty diet.  The patient has a regular exercise program: Zumba/walking , 45-60 min duration, 7 days per week.  The benefits of regular aerobic exercise were discussed.  Depression screen: there are no signs or vegative symptoms of depression- irritability, change in appetite, anhedonia, sadness/tearfullness.  Cognitive assessment: the patient manages  all their financial and personal affairs and is actively engaged.   Past Medical History  Diagnosis Date  . Hematuria   . Tremor   . Hair loss   . Unspecified vitamin D deficiency   . Migraines   . Osteoporosis   . Osteoarthritis   . Allergic rhinitis    Past Surgical History  Procedure Date  . Abdominal hysterectomy     1972  . Oophorectomy     1976  . Tonsillectomy   . Excision of lipoma     left scapula area  . Reduction mammoplasty     1994  . Rhinoplasty     1985  . Facial cosmetic surgery     '10 Tora Duck Astin MD in [place])   Family History  Problem Relation Age of Onset  . Arthritis Mother   . Heart disease Father   . Dementia Father   . Diabetes Sister   . Cancer Maternal Grandfather     Lung cancer   History   Social History  . Marital Status: Married    Spouse Name: N/A    Number of Children: N/A  . Years of Education: N/A   Occupational History  . Not on file.   Social History Main Topics  . Smoking status: Never Smoker   . Smokeless tobacco: Never Used  . Alcohol Use: No  . Drug Use: No  . Sexually Active: Not on file   Other Topics Concern  . 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 cholecalciferol (VITAMIN D) 1000 UNITS tablet Take 1,000 Units by mouth daily.        . Teriparatide, Recombinant, (FORTEO) 600 MCG/2.4ML SOLN Inject into the skin daily.         Vision, hearing, body mass index were assessed and reviewed.   During the course of the visit the patient was educated and counseled about appropriate screening and preventive services including : fall prevention , diabetes screening, nutrition counseling, colorectal cancer screening, and recommended  immunizations.    Review of Systems Constitutional:  Negative for fever, chills, activity change and unexpected weight change.  HEENT:  Negative for hearing loss, ear pain, congestion, neck stiffness and postnasal drip. Negative for sore throat or swallowing problems. Negative for dental complaints.   Eyes: Negative for vision loss or change in visual acuity.  Respiratory: Negative for chest tightness and wheezing. Negative for DOE.   Cardiovascular: Negative for chest pain or palpitations. No decreased exercise tolerance Gastrointestinal: No change in bowel habit. No bloating or gas. No reflux or indigestion Genitourinary: Negative for urgency, frequency, flank pain and difficulty urinating.  Musculoskeletal: Negative for myalgias, back pain, arthralgias and gait problem.  Neurological: Negative for dizziness, tremors, weakness and headaches.  Hematological: Negative for adenopathy.  Psychiatric/Behavioral: Negative for behavioral problems and dysphoric mood.       Objective:   Physical Exam Filed Vitals:   02/12/12 1404  BP: 110/58  Pulse: 83  Temp: 96.8 F (36 C)  Resp: 10   Wt Readings from Last 3 Encounters:  02/12/12 117 lb 1.3 oz (53.107 kg)  09/07/11 128 lb (58.06 kg)  01/08/11 122 lb (55.339 kg)   Gen'l: well nourished, well developed white Woman in no distress HEENT - [place]/AT, EACs/TMs normal with scant amount of cerumen, oropharynx with native dentition in good condition, no buccal or palatal lesions, posterior pharynx clear, mucous membranes moist. C&S clear, PERRLA, fundi - normal Neck - supple, no thyromegaly Nodes- negative submental, cervical, supraclavicular regions Chest - no deformity, no CVAT Lungs - clear without rales, wheezes. No increased work of breathing Breast - deferred to gyn Cardiovascular - regular rate and rhythm, quiet precordium, no murmurs, rubs or gallops, 2+ radial, DP and PT pulses Abdomen - BS+ x 4, no HSM, no guarding or rebound or  tenderness Pelvic - deferred to gyn Rectal - deferred to gyn Extremities - no clubbing, cyanosis, edema or deformity.  Neuro - A&O x 3, CN II-XII normal, motor strength normal and equal, DTRs 2+ and symmetrical biceps, radial, and patellar tendons. Cerebellar - no tremor, no rigidity, fluid movement and normal gait. Derm - Head, neck, back, abdomen and extremities without suspicious lesions. Recent excision site right jaw line.          Assessment & Plan:

## 2012-02-13 ENCOUNTER — Encounter: Payer: Medicare Other | Admitting: Internal Medicine

## 2012-02-16 NOTE — Assessment & Plan Note (Signed)
Last Vit D level at Dr. Corwin Levins office was in normal range at 35

## 2012-02-16 NOTE — Assessment & Plan Note (Signed)
Interval history is benign. Limited physical exam is normal. Labs to be ordered by Dr. Leslie Dales with copies to our chart. She is current with Gyn and with colorectal cancer screening. Immunizations are up to date.   In summary - a very pleasant woman who appears to be medically stable. She will return as needed or in 6 months for interval follow-up.

## 2012-02-25 ENCOUNTER — Telehealth: Payer: Self-pay | Admitting: *Deleted

## 2012-02-25 NOTE — Telephone Encounter (Signed)
Yes, she is due

## 2012-02-25 NOTE — Telephone Encounter (Signed)
Kristen Castillo had a colon in '08 with Bx which were benign.  Please advise if you want her to have a colon now.  She is scheduled in previsit 02/27/12

## 2012-03-19 ENCOUNTER — Encounter: Payer: Medicare Other | Admitting: Internal Medicine

## 2012-04-09 ENCOUNTER — Ambulatory Visit (AMBULATORY_SURGERY_CENTER): Payer: Medicare Other | Admitting: *Deleted

## 2012-04-09 VITALS — Ht 62.0 in | Wt 121.2 lb

## 2012-04-09 DIAGNOSIS — Z1211 Encounter for screening for malignant neoplasm of colon: Secondary | ICD-10-CM

## 2012-04-09 MED ORDER — MOVIPREP 100 G PO SOLR: ORAL | Status: DC

## 2012-04-10 ENCOUNTER — Telehealth: Payer: Self-pay | Admitting: *Deleted

## 2012-04-10 DIAGNOSIS — Z1211 Encounter for screening for malignant neoplasm of colon: Secondary | ICD-10-CM

## 2012-04-10 MED ORDER — ONDANSETRON HCL 4 MG PO TABS: ORAL_TABLET | ORAL | Status: DC

## 2012-04-10 NOTE — Telephone Encounter (Signed)
Left message that rx for Zofran has been sent to [facility]

## 2012-04-10 NOTE — Telephone Encounter (Signed)
Prescribe Zofran 4 mg, dispense 6, take one PO,  20 minutes before initiating each prep session. May use every 4 hours as needed, as well. Thanks

## 2012-04-10 NOTE — Telephone Encounter (Signed)
Dr Marina Goodell: pt is scheduled for colonoscopy 2/13.  She has problems drinking a prep without getting nauseated.  She is calling requesting antinausea medication to take before evening and morning dose of prep.  Thanks, Olegario Messier in [facility]

## 2012-04-23 ENCOUNTER — Encounter: Payer: Medicare Other | Admitting: Internal Medicine

## 2012-06-10 ENCOUNTER — Encounter: Payer: Self-pay | Admitting: Internal Medicine

## 2012-06-30 ENCOUNTER — Encounter: Payer: Medicare Other | Admitting: Internal Medicine

## 2012-07-02 ENCOUNTER — Encounter: Payer: Medicare Other | Admitting: Internal Medicine

## 2012-07-09 ENCOUNTER — Encounter: Payer: Self-pay | Admitting: Internal Medicine

## 2012-12-25 ENCOUNTER — Encounter (HOSPITAL_COMMUNITY): Payer: Self-pay | Admitting: Emergency Medicine

## 2012-12-25 ENCOUNTER — Emergency Department (HOSPITAL_COMMUNITY)
Admission: EM | Admit: 2012-12-25 | Discharge: 2012-12-25 | Disposition: A | Discharge status: Home / Self Care | Payer: Medicare Other | Attending: Emergency Medicine | Admitting: Emergency Medicine

## 2012-12-25 ENCOUNTER — Emergency Department (HOSPITAL_COMMUNITY): Payer: Medicare Other

## 2012-12-25 DIAGNOSIS — S298XXA Other specified injuries of thorax, initial encounter: Secondary | ICD-10-CM | POA: Insufficient documentation

## 2012-12-25 DIAGNOSIS — Y92410 Unspecified street and highway as the place of occurrence of the external cause: Secondary | ICD-10-CM | POA: Insufficient documentation

## 2012-12-25 DIAGNOSIS — R0789 Other chest pain: Secondary | ICD-10-CM

## 2012-12-25 DIAGNOSIS — Z8639 Personal history of other endocrine, nutritional and metabolic disease: Secondary | ICD-10-CM | POA: Insufficient documentation

## 2012-12-25 DIAGNOSIS — Z872 Personal history of diseases of the skin and subcutaneous tissue: Secondary | ICD-10-CM | POA: Insufficient documentation

## 2012-12-25 DIAGNOSIS — Z79899 Other long term (current) drug therapy: Secondary | ICD-10-CM | POA: Insufficient documentation

## 2012-12-25 DIAGNOSIS — Z8739 Personal history of other diseases of the musculoskeletal system and connective tissue: Secondary | ICD-10-CM | POA: Insufficient documentation

## 2012-12-25 DIAGNOSIS — Y9389 Activity, other specified: Secondary | ICD-10-CM | POA: Insufficient documentation

## 2012-12-25 DIAGNOSIS — Z862 Personal history of diseases of the blood and blood-forming organs and certain disorders involving the immune mechanism: Secondary | ICD-10-CM | POA: Insufficient documentation

## 2012-12-25 DIAGNOSIS — R42 Dizziness and giddiness: Secondary | ICD-10-CM | POA: Insufficient documentation

## 2012-12-25 DIAGNOSIS — Z8679 Personal history of other diseases of the circulatory system: Secondary | ICD-10-CM | POA: Insufficient documentation

## 2012-12-25 DIAGNOSIS — Z8742 Personal history of other diseases of the female genital tract: Secondary | ICD-10-CM | POA: Insufficient documentation

## 2012-12-25 DIAGNOSIS — Z8709 Personal history of other diseases of the respiratory system: Secondary | ICD-10-CM | POA: Insufficient documentation

## 2012-12-25 LAB — POCT I-STAT, CHEM 8
BUN: 21 mg/dL (ref 6–23)
Chloride: 104 mEq/L (ref 96–112)
Glucose, Bld: 119 mg/dL — ABNORMAL HIGH (ref 70–99)
HCT: 44 % (ref 36.0–46.0)
Potassium: 4.5 mEq/L (ref 3.5–5.1)

## 2012-12-25 LAB — POCT I-STAT TROPONIN I: Troponin i, poc: 0 ng/mL (ref 0.00–0.08)
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
## 2012-12-26 NOTE — ED Provider Notes (Signed)
Medical screening examination/treatment/procedure(s) were performed by non-physician practitioner and as supervising physician I was immediately available for consultation/collaboration.     Tamsin Nader R Mitra Duling, MD 12/26/12 1603 

## 2013-02-05 ENCOUNTER — Other Ambulatory Visit: Payer: Self-pay | Admitting: Internal Medicine

## 2013-02-05 DIAGNOSIS — Z1239 Encounter for other screening for malignant neoplasm of breast: Secondary | ICD-10-CM

## 2013-04-14 ENCOUNTER — Other Ambulatory Visit: Payer: Medicare Other

## 2013-04-14 ENCOUNTER — Ambulatory Visit (INDEPENDENT_AMBULATORY_CARE_PROVIDER_SITE_OTHER): Payer: Medicare Other | Admitting: Internal Medicine

## 2013-04-14 ENCOUNTER — Encounter: Payer: Self-pay | Admitting: Internal Medicine

## 2013-04-14 VITALS — BP 140/70 | HR 62 | Temp 97.0°F | Ht 62.0 in | Wt 114.4 lb

## 2013-04-14 DIAGNOSIS — M810 Age-related osteoporosis without current pathological fracture: Secondary | ICD-10-CM

## 2013-04-14 DIAGNOSIS — E559 Vitamin D deficiency, unspecified: Secondary | ICD-10-CM

## 2013-04-14 DIAGNOSIS — Z0000 Encounter for general adult medical examination without abnormal findings: Secondary | ICD-10-CM

## 2013-04-14 DIAGNOSIS — Z23 Encounter for immunization: Secondary | ICD-10-CM

## 2013-04-14 LAB — BASIC METABOLIC PANEL
BUN: 20 mg/dL (ref 6–23)
CO2: 27 mEq/L (ref 19–32)
Calcium: 9.3 mg/dL (ref 8.4–10.5)
Chloride: 104 mEq/L (ref 96–112)
Creatinine, Ser: 0.8 mg/dL (ref 0.4–1.2)
GFR: 72.76 mL/min (ref >60.00)
Glucose, Bld: 95 mg/dL (ref 70–99)
Potassium: 4.5 mEq/L (ref 3.5–5.1)
Sodium: 138 mEq/L (ref 135–145)

## 2013-04-14 LAB — LIPID PANEL
CHOLESTEROL: 186 mg/dL (ref 0–200)
HDL: 51.9 mg/dL (ref >39.00)
LDL CALC: 104 mg/dL — AB (ref 0–99)
Total CHOL/HDL Ratio: 4
Triglycerides: 149 mg/dL (ref 0.0–149.0)
VLDL: 29.8 mg/dL (ref 0.0–40.0)

## 2013-04-14 NOTE — Assessment & Plan Note (Signed)
-   Patient will start 1000 IU (international units) Vitamin D supplement daily (over the counter) - Calcium (including diet) daily intake 1200mg . Eat more yogurt, cottage cheese, leafy green vegetables, calcium fortified orange juice. If you decide to add a supplement, consider Tums.

## 2013-04-14 NOTE — Patient Instructions (Addendum)
It has been a pleasure providing medical care for you today.  1) Vitamin D and Calcium supplementation - Take 1000 IU (international units) supplement daily (over the counter) - Calcium (including diet) daily intake 1200mg . Eat more yogurt, cottage cheese, leafy green vegetables, calcium fortified orange juice. If you decide to add a supplement, consider Tums.   2) Varicose veins - they are a problem and need treatment when there is phlebitis - inflammation of the vein. You'll notice redness, heat, and pain.   3) Colonoscopy - Schedule this procedure when possible for routine health maintenance. We discussed the option of Cologaurd ([email or web address]) as an alternative.  4) Prevnar  - we gave the Prevnar vaccine today, considered a companion vaccine for the pneumonia vaccine. .    Vitamin D Deficiency Vitamin D is an important vitamin that your body needs. Having too little of it in your body is called a deficiency. A very bad deficiency can make your bones soft and can cause a condition called rickets.  Vitamin D is important to your body for different reasons, such as:   It helps your body absorb 2 minerals called calcium and phosphorus.  It helps make your bones healthy.  It may prevent some diseases, such as diabetes and multiple sclerosis.  It helps your muscles and heart. You can get vitamin D in several ways. It is a natural part of some foods. The vitamin is also added to some dairy products and cereals. Some people take vitamin D supplements. Also, your body makes vitamin D when you are in the sun. It changes the sun's rays into a form of the vitamin that your body can use. CAUSES   Not eating enough foods that contain vitamin D.  Not getting enough sunlight.  Having certain digestive system diseases that make it hard to absorb vitamin D. These diseases include Crohn's disease, chronic pancreatitis, and cystic fibrosis.  Having a surgery in which part of the stomach or  small intestine is removed.  Being obese. Fat cells pull vitamin D out of your blood. That means that obese people may not have enough vitamin D left in their blood and in other body tissues.  Having chronic kidney or liver disease. RISK FACTORS Risk factors are things that make you more likely to develop a vitamin D deficiency. They include:  Being older.  Not being able to get outside very much.  Living in a nursing home.  Having had broken bones.  Having weak or thin bones (osteoporosis).  Having a disease or condition that changes how your body absorbs vitamin D.  Having dark skin.  Some medicines such as seizure medicines or steroids.  Being overweight or obese. SYMPTOMS Mild cases of vitamin D deficiency may not have any symptoms. If you have a very bad case, symptoms may include:  Bone pain.  Muscle pain.  Falling often.  Broken bones caused by a minor injury, due to osteoporosis. DIAGNOSIS A blood test is the best way to tell if you have a vitamin D deficiency. TREATMENT Vitamin D deficiency can be treated in different ways. Treatment for vitamin D deficiency depends on what is causing it. Options include:  Taking vitamin D supplements.  Taking a calcium supplement. Your caregiver will suggest what dose is best for you. HOME CARE INSTRUCTIONS  Take any supplements that your caregiver prescribes. Follow the directions carefully. Take only the suggested amount.  Have your blood tested 2 months after you start taking supplements.  Eat foods that  contain vitamin D. Healthy choices include:  Fortified dairy products, cereals, or juices. Fortified means vitamin D has been added to the food. Check the label on the package to be sure.  Fatty fish like salmon or trout.  Eggs.  Oysters.  Do not use a tanning bed.  Keep your weight at a healthy level. Lose weight if you need to.  Keep all follow-up appointments. Your caregiver will need to perform blood  tests to make sure your vitamin D deficiency is going away. SEEK MEDICAL CARE IF:  You have any questions about your treatment.  You continue to have symptoms of vitamin D deficiency.  You have nausea or vomiting.  You are constipated.  You feel confused.  You have severe abdominal or back pain. MAKE SURE YOU:  Understand these instructions.  Will watch your condition.  Will get help right away if you are not doing well or get worse. Document Released: 05/20/2011 Document Revised: 06/22/2012 Document Reviewed: 05/20/2011 [facility] Patient Information 2014 [place].

## 2013-04-14 NOTE — Progress Notes (Signed)
Pre visit review using our clinic review tool, if applicable. No additional management support is needed unless otherwise documented below in the visit note. 
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
## 2013-04-15 LAB — VITAMIN D 25 HYDROXY (VIT D DEFICIENCY, FRACTURES): Vit D, 25-Hydroxy: 35 ng/mL (ref 30–89)

## 2013-04-15 NOTE — Assessment & Plan Note (Signed)
Last DEXA January '14: 7/7 5 increase in bone density; Lumbar spine - T-score in normal range; L femoral neck -2.3; R femoral neck -1.9. BMI normal  Plan Calcium and Vit D, light weight bearing exercise  Repeat DEXA Jan '16

## 2013-04-16 ENCOUNTER — Encounter: Payer: Self-pay | Admitting: Internal Medicine

## 2013-04-16 ENCOUNTER — Telehealth: Payer: Self-pay

## 2013-04-16 NOTE — Telephone Encounter (Signed)
Prepared for mail today - she was advised that lab letters are batched on Fridays.

## 2013-04-16 NOTE — Telephone Encounter (Signed)
Patient's husband is requesting her results be mailed. Have you reviewed them yet?

## 2013-10-13 DIAGNOSIS — H40839 Aqueous misdirection, unspecified eye: Secondary | ICD-10-CM | POA: Insufficient documentation

## 2013-10-13 DIAGNOSIS — H40832 Aqueous misdirection, left eye: Secondary | ICD-10-CM | POA: Insufficient documentation

## 2013-10-13 DIAGNOSIS — Z961 Presence of intraocular lens: Secondary | ICD-10-CM | POA: Insufficient documentation

## 2013-10-13 DIAGNOSIS — H40031 Anatomical narrow angle, right eye: Secondary | ICD-10-CM | POA: Insufficient documentation

## 2013-10-13 DIAGNOSIS — H2511 Age-related nuclear cataract, right eye: Secondary | ICD-10-CM | POA: Insufficient documentation

## 2014-02-17 ENCOUNTER — Telehealth: Payer: Self-pay | Admitting: Internal Medicine

## 2014-02-17 NOTE — Telephone Encounter (Signed)
Left vm for pt to call and make an appt with Dr. Camila Li

## 2014-02-17 NOTE — Telephone Encounter (Signed)
OK new pt for both Thx

## 2014-02-17 NOTE — Telephone Encounter (Signed)
Pt called request to transfer from Dr. Maryellen Hedges to Dr. Camila Li. Pt stated she spoke with Dr. Zonya Hedges before he retired and he suggested that Dr. Camila Li will be good. Please advise. This will be her and her spouse Kristen Castillo [phone]).

## 2014-03-01 LAB — HM MAMMOGRAPHY

## 2014-03-21 ENCOUNTER — Ambulatory Visit (INDEPENDENT_AMBULATORY_CARE_PROVIDER_SITE_OTHER)
Admission: RE | Admit: 2014-03-21 | Discharge: 2014-03-21 | Disposition: A | Discharge status: Home / Self Care | Payer: Medicare Other | Source: Ambulatory Visit | Attending: Internal Medicine | Admitting: Internal Medicine

## 2014-03-21 ENCOUNTER — Encounter: Payer: Self-pay | Admitting: Internal Medicine

## 2014-03-21 ENCOUNTER — Ambulatory Visit (INDEPENDENT_AMBULATORY_CARE_PROVIDER_SITE_OTHER): Payer: Medicare Other | Admitting: Internal Medicine

## 2014-03-21 VITALS — BP 118/62 | HR 65 | Temp 97.8°F | Ht 62.75 in | Wt 115.0 lb

## 2014-03-21 DIAGNOSIS — R059 Cough, unspecified: Secondary | ICD-10-CM

## 2014-03-21 DIAGNOSIS — H6123 Impacted cerumen, bilateral: Secondary | ICD-10-CM

## 2014-03-21 DIAGNOSIS — R05 Cough: Secondary | ICD-10-CM

## 2014-03-21 DIAGNOSIS — Z0000 Encounter for general adult medical examination without abnormal findings: Secondary | ICD-10-CM

## 2014-03-21 DIAGNOSIS — E559 Vitamin D deficiency, unspecified: Secondary | ICD-10-CM

## 2014-03-21 DIAGNOSIS — J309 Allergic rhinitis, unspecified: Secondary | ICD-10-CM

## 2014-03-21 DIAGNOSIS — M810 Age-related osteoporosis without current pathological fracture: Secondary | ICD-10-CM

## 2014-03-21 DIAGNOSIS — H6120 Impacted cerumen, unspecified ear: Secondary | ICD-10-CM | POA: Insufficient documentation

## 2014-03-21 MED ORDER — ERGOCALCIFEROL 1.25 MG (50000 UT) PO CAPS: 50000.0000 [IU] | ORAL_CAPSULE | ORAL | Status: DC

## 2014-03-21 MED ORDER — VITAMIN D3 50 MCG (2000 UT) PO CAPS: 2000.0000 [IU] | ORAL_CAPSULE | Freq: Every day | ORAL | Status: DC

## 2014-03-21 NOTE — Assessment & Plan Note (Signed)
Will irrigate 

## 2014-03-21 NOTE — Assessment & Plan Note (Signed)
Continue with current prescription therapy as reflected on the Med list.  

## 2014-03-21 NOTE — Patient Instructions (Signed)
Preventive Care for Adults A healthy lifestyle and preventive care can promote health and wellness. Preventive health guidelines for women include the following key practices.  A routine yearly physical is a good way to check with your health care provider about your health and preventive screening. It is a chance to share any concerns and updates on your health and to receive a thorough exam.  Visit your dentist for a routine exam and preventive care every 6 months. Brush your teeth twice a day and floss once a day. Good oral hygiene prevents tooth decay and gum disease.  The frequency of eye exams is based on your age, health, family medical history, use of contact lenses, and other factors. Follow your health care provider's recommendations for frequency of eye exams.  Eat a healthy diet. Foods like vegetables, fruits, whole grains, low-fat dairy products, and lean protein foods contain the nutrients you need without too many calories. Decrease your intake of foods high in solid fats, added sugars, and salt. Eat the right amount of calories for you.Get information about a proper diet from your health care provider, if necessary.  Regular physical exercise is one of the most important things you can do for your health. Most adults should get at least 150 minutes of moderate-intensity exercise (any activity that increases your heart rate and causes you to sweat) each week. In addition, most adults need muscle-strengthening exercises on 2 or more days a week.  Maintain a healthy weight. The body mass index (BMI) is a screening tool to identify possible weight problems. It provides an estimate of body fat based on height and weight. Your health care provider can find your BMI and can help you achieve or maintain a healthy weight.For adults 20 years and older:  A BMI below 18.5 is considered underweight.  A BMI of 18.5 to 24.9 is normal.  A BMI of 25 to 29.9 is considered overweight.  A BMI of  30 and above is considered obese.  Maintain normal blood lipids and cholesterol levels by exercising and minimizing your intake of saturated fat. Eat a balanced diet with plenty of fruit and vegetables. Blood tests for lipids and cholesterol should begin at age 76 and be repeated every 5 years. If your lipid or cholesterol levels are high, you are over 50, or you are at high risk for heart disease, you may need your cholesterol levels checked more frequently.Ongoing high lipid and cholesterol levels should be treated with medicines if diet and exercise are not working.  If you smoke, find out from your health care provider how to quit. If you do not use tobacco, do not start.  Lung cancer screening is recommended for adults aged 22-80 years who are at high risk for developing lung cancer because of a history of smoking. A yearly low-dose CT scan of the lungs is recommended for people who have at least a 30-pack-year history of smoking and are a current smoker or have quit within the past 15 years. A pack year of smoking is smoking an average of 1 pack of cigarettes a day for 1 year (for example: 1 pack a day for 30 years or 2 packs a day for 15 years). Yearly screening should continue until the smoker has stopped smoking for at least 15 years. Yearly screening should be stopped for people who develop a health problem that would prevent them from having lung cancer treatment.  If you are pregnant, do not drink alcohol. If you are breastfeeding,  be very cautious about drinking alcohol. If you are not pregnant and choose to drink alcohol, do not have more than 1 drink per day. One drink is considered to be 12 ounces (355 mL) of beer, 5 ounces (148 mL) of wine, or 1.5 ounces (44 mL) of liquor.  Avoid use of street drugs. Do not share needles with anyone. Ask for help if you need support or instructions about stopping the use of drugs.  High blood pressure causes heart disease and increases the risk of  stroke. Your blood pressure should be checked at least every 1 to 2 years. Ongoing high blood pressure should be treated with medicines if weight loss and exercise do not work.  If you are 75-52 years old, ask your health care provider if you should take aspirin to prevent strokes.  Diabetes screening involves taking a blood sample to check your fasting blood sugar level. This should be done once every 3 years, after age 15, if you are within normal weight and without risk factors for diabetes. Testing should be considered at a younger age or be carried out more frequently if you are overweight and have at least 1 risk factor for diabetes.  Breast cancer screening is essential preventive care for women. You should practice "breast self-awareness." This means understanding the normal appearance and feel of your breasts and may include breast self-examination. Any changes detected, no matter how small, should be reported to a health care provider. Women in their 58s and 30s should have a clinical breast exam (CBE) by a health care provider as part of a regular health exam every 1 to 3 years. After age 16, women should have a CBE every year. Starting at age 53, women should consider having a mammogram (breast X-ray test) every year. Women who have a family history of breast cancer should talk to their health care provider about genetic screening. Women at a high risk of breast cancer should talk to their health care providers about having an MRI and a mammogram every year.  Breast cancer gene (BRCA)-related cancer risk assessment is recommended for women who have family members with BRCA-related cancers. BRCA-related cancers include breast, ovarian, tubal, and peritoneal cancers. Having family members with these cancers may be associated with an increased risk for harmful changes (mutations) in the breast cancer genes BRCA1 and BRCA2. Results of the assessment will determine the need for genetic counseling and  BRCA1 and BRCA2 testing.  Routine pelvic exams to screen for cancer are no longer recommended for nonpregnant women who are considered low risk for cancer of the pelvic organs (ovaries, uterus, and vagina) and who do not have symptoms. Ask your health care provider if a screening pelvic exam is right for you.  If you have had past treatment for cervical cancer or a condition that could lead to cancer, you need Pap tests and screening for cancer for at least 20 years after your treatment. If Pap tests have been discontinued, your risk factors (such as having a new sexual partner) need to be reassessed to determine if screening should be resumed. Some women have medical problems that increase the chance of getting cervical cancer. In these cases, your health care provider may recommend more frequent screening and Pap tests.  The HPV test is an additional test that may be used for cervical cancer screening. The HPV test looks for the virus that can cause the cell changes on the cervix. The cells collected during the Pap test can be  tested for HPV. The HPV test could be used to screen women aged 30 years and older, and should be used in women of any age who have unclear Pap test results. After the age of 30, women should have HPV testing at the same frequency as a Pap test.  Colorectal cancer can be detected and often prevented. Most routine colorectal cancer screening begins at the age of 50 years and continues through age 75 years. However, your health care provider may recommend screening at an earlier age if you have risk factors for colon cancer. On a yearly basis, your health care provider may provide home test kits to check for hidden blood in the stool. Use of a small camera at the end of a tube, to directly examine the colon (sigmoidoscopy or colonoscopy), can detect the earliest forms of colorectal cancer. Talk to your health care provider about this at age 50, when routine screening begins. Direct  exam of the colon should be repeated every 5-10 years through age 75 years, unless early forms of pre-cancerous polyps or small growths are found.  People who are at an increased risk for hepatitis B should be screened for this virus. You are considered at high risk for hepatitis B if:  You were born in a country where hepatitis B occurs often. Talk with your health care provider about which countries are considered high risk.  Your parents were born in a high-risk country and you have not received a shot to protect against hepatitis B (hepatitis B vaccine).  You have HIV or AIDS.  You use needles to inject street drugs.  You live with, or have sex with, someone who has hepatitis B.  You get hemodialysis treatment.  You take certain medicines for conditions like cancer, organ transplantation, and autoimmune conditions.  Hepatitis C blood testing is recommended for all people born from 1945 through 1965 and any individual with known risks for hepatitis C.  Practice safe sex. Use condoms and avoid high-risk sexual practices to reduce the spread of sexually transmitted infections (STIs). STIs include gonorrhea, chlamydia, syphilis, trichomonas, herpes, HPV, and human immunodeficiency virus (HIV). Herpes, HIV, and HPV are viral illnesses that have no cure. They can result in disability, cancer, and death.  You should be screened for sexually transmitted illnesses (STIs) including gonorrhea and chlamydia if:  You are sexually active and are younger than 24 years.  You are older than 24 years and your health care provider tells you that you are at risk for this type of infection.  Your sexual activity has changed since you were last screened and you are at an increased risk for chlamydia or gonorrhea. Ask your health care provider if you are at risk.  If you are at risk of being infected with HIV, it is recommended that you take a prescription medicine daily to prevent HIV infection. This is  called preexposure prophylaxis (PrEP). You are considered at risk if:  You are a heterosexual woman, are sexually active, and are at increased risk for HIV infection.  You take drugs by injection.  You are sexually active with a partner who has HIV.  Talk with your health care provider about whether you are at high risk of being infected with HIV. If you choose to begin PrEP, you should first be tested for HIV. You should then be tested every 3 months for as long as you are taking PrEP.  Osteoporosis is a disease in which the bones lose minerals and strength   with aging. This can result in serious bone fractures or breaks. The risk of osteoporosis can be identified using a bone density scan. Women ages 65 years and over and women at risk for fractures or osteoporosis should discuss screening with their health care providers. Ask your health care provider whether you should take a calcium supplement or vitamin D to reduce the rate of osteoporosis.  Menopause can be associated with physical symptoms and risks. Hormone replacement therapy is available to decrease symptoms and risks. You should talk to your health care provider about whether hormone replacement therapy is right for you.  Use sunscreen. Apply sunscreen liberally and repeatedly throughout the day. You should seek shade when your shadow is shorter than you. Protect yourself by wearing long sleeves, pants, a wide-brimmed hat, and sunglasses year round, whenever you are outdoors.  Once a month, do a whole body skin exam, using a mirror to look at the skin on your back. Tell your health care provider of new moles, moles that have irregular borders, moles that are larger than a pencil eraser, or moles that have changed in shape or color.  Stay current with required vaccines (immunizations).  Influenza vaccine. All adults should be immunized every year.  Tetanus, diphtheria, and acellular pertussis (Td, Tdap) vaccine. Pregnant women should  receive 1 dose of Tdap vaccine during each pregnancy. The dose should be obtained regardless of the length of time since the last dose. Immunization is preferred during the 27th-36th week of gestation. An adult who has not previously received Tdap or who does not know her vaccine status should receive 1 dose of Tdap. This initial dose should be followed by tetanus and diphtheria toxoids (Td) booster doses every 10 years. Adults with an unknown or incomplete history of completing a 3-dose immunization series with Td-containing vaccines should begin or complete a primary immunization series including a Tdap dose. Adults should receive a Td booster every 10 years.  Varicella vaccine. An adult without evidence of immunity to varicella should receive 2 doses or a second dose if she has previously received 1 dose. Pregnant females who do not have evidence of immunity should receive the first dose after pregnancy. This first dose should be obtained before leaving the health care facility. The second dose should be obtained 4-8 weeks after the first dose.  Human papillomavirus (HPV) vaccine. Females aged 13-26 years who have not received the vaccine previously should obtain the 3-dose series. The vaccine is not recommended for use in pregnant females. However, pregnancy testing is not needed before receiving a dose. If a female is found to be pregnant after receiving a dose, no treatment is needed. In that case, the remaining doses should be delayed until after the pregnancy. Immunization is recommended for any person with an immunocompromised condition through the age of 26 years if she did not get any or all doses earlier. During the 3-dose series, the second dose should be obtained 4-8 weeks after the first dose. The third dose should be obtained 24 weeks after the first dose and 16 weeks after the second dose.  Zoster vaccine. One dose is recommended for adults aged 60 years or older unless certain conditions are  present.  Measles, mumps, and rubella (MMR) vaccine. Adults born before 1957 generally are considered immune to measles and mumps. Adults born in 1957 or later should have 1 or more doses of MMR vaccine unless there is a contraindication to the vaccine or there is laboratory evidence of immunity to   each of the three diseases. A routine second dose of MMR vaccine should be obtained at least 28 days after the first dose for students attending postsecondary schools, health care workers, or international travelers. People who received inactivated measles vaccine or an unknown type of measles vaccine during 1963-1967 should receive 2 doses of MMR vaccine. People who received inactivated mumps vaccine or an unknown type of mumps vaccine before 1979 and are at high risk for mumps infection should consider immunization with 2 doses of MMR vaccine. For females of childbearing age, rubella immunity should be determined. If there is no evidence of immunity, females who are not pregnant should be vaccinated. If there is no evidence of immunity, females who are pregnant should delay immunization until after pregnancy. Unvaccinated health care workers born before 1957 who lack laboratory evidence of measles, mumps, or rubella immunity or laboratory confirmation of disease should consider measles and mumps immunization with 2 doses of MMR vaccine or rubella immunization with 1 dose of MMR vaccine.  Pneumococcal 13-valent conjugate (PCV13) vaccine. When indicated, a person who is uncertain of her immunization history and has no record of immunization should receive the PCV13 vaccine. An adult aged 19 years or older who has certain medical conditions and has not been previously immunized should receive 1 dose of PCV13 vaccine. This PCV13 should be followed with a dose of pneumococcal polysaccharide (PPSV23) vaccine. The PPSV23 vaccine dose should be obtained at least 8 weeks after the dose of PCV13 vaccine. An adult aged 19  years or older who has certain medical conditions and previously received 1 or more doses of PPSV23 vaccine should receive 1 dose of PCV13. The PCV13 vaccine dose should be obtained 1 or more years after the last PPSV23 vaccine dose.  Pneumococcal polysaccharide (PPSV23) vaccine. When PCV13 is also indicated, PCV13 should be obtained first. All adults aged 65 years and older should be immunized. An adult younger than age 65 years who has certain medical conditions should be immunized. Any person who resides in a nursing home or long-term care facility should be immunized. An adult smoker should be immunized. People with an immunocompromised condition and certain other conditions should receive both PCV13 and PPSV23 vaccines. People with human immunodeficiency virus (HIV) infection should be immunized as soon as possible after diagnosis. Immunization during chemotherapy or radiation therapy should be avoided. Routine use of PPSV23 vaccine is not recommended for American Indians, [place] Natives, or people younger than 65 years unless there are medical conditions that require PPSV23 vaccine. When indicated, people who have unknown immunization and have no record of immunization should receive PPSV23 vaccine. One-time revaccination 5 years after the first dose of PPSV23 is recommended for people aged 19-64 years who have chronic kidney failure, nephrotic syndrome, asplenia, or immunocompromised conditions. People who received 1-2 doses of PPSV23 before age 65 years should receive another dose of PPSV23 vaccine at age 65 years or later if at least 5 years have passed since the previous dose. Doses of PPSV23 are not needed for people immunized with PPSV23 at or after age 65 years.  Meningococcal vaccine. Adults with asplenia or persistent complement component deficiencies should receive 2 doses of quadrivalent meningococcal conjugate (MenACWY-D) vaccine. The doses should be obtained at least 2 months apart.  Microbiologists working with certain meningococcal bacteria, military recruits, people at risk during an outbreak, and people who travel to or live in countries with a high rate of meningitis should be immunized. A first-year college student up through age   21 years who is living in a residence hall should receive a dose if she did not receive a dose on or after her 16th birthday. Adults who have certain high-risk conditions should receive one or more doses of vaccine.  Hepatitis A vaccine. Adults who wish to be protected from this disease, have certain high-risk conditions, work with hepatitis A-infected animals, work in hepatitis A research labs, or travel to or work in countries with a high rate of hepatitis A should be immunized. Adults who were previously unvaccinated and who anticipate close contact with an international adoptee during the first 60 days after arrival in the [place] from a country with a high rate of hepatitis A should be immunized.  Hepatitis B vaccine. Adults who wish to be protected from this disease, have certain high-risk conditions, may be exposed to blood or other infectious body fluids, are household contacts or sex partners of hepatitis B positive people, are clients or workers in certain care facilities, or travel to or work in countries with a high rate of hepatitis B should be immunized.  Haemophilus influenzae type b (Hib) vaccine. A previously unvaccinated person with asplenia or sickle cell disease or having a scheduled splenectomy should receive 1 dose of Hib vaccine. Regardless of previous immunization, a recipient of a hematopoietic stem cell transplant should receive a 3-dose series 6-12 months after her successful transplant. Hib vaccine is not recommended for adults with HIV infection. Preventive Services / Frequency Ages 64 to 68 years  Blood pressure check.** / Every 1 to 2 years.  Lipid and cholesterol check.** / Every 5 years beginning at age  22.  Clinical breast exam.** / Every 3 years for women in their 88s and 53s.  BRCA-related cancer risk assessment.** / For women who have family members with a BRCA-related cancer (breast, ovarian, tubal, or peritoneal cancers).  Pap test.** / Every 2 years from ages 90 through 51. Every 3 years starting at age 21 through age 56 or 3 with a history of 3 consecutive normal Pap tests.  HPV screening.** / Every 3 years from ages 24 through ages 1 to 46 with a history of 3 consecutive normal Pap tests.  Hepatitis C blood test.** / For any individual with known risks for hepatitis C.  Skin self-exam. / Monthly.  Influenza vaccine. / Every year.  Tetanus, diphtheria, and acellular pertussis (Tdap, Td) vaccine.** / Consult your health care provider. Pregnant women should receive 1 dose of Tdap vaccine during each pregnancy. 1 dose of Td every 10 years.  Varicella vaccine.** / Consult your health care provider. Pregnant females who do not have evidence of immunity should receive the first dose after pregnancy.  HPV vaccine. / 3 doses over 6 months, if 72 and younger. The vaccine is not recommended for use in pregnant females. However, pregnancy testing is not needed before receiving a dose.  Measles, mumps, rubella (MMR) vaccine.** / You need at least 1 dose of MMR if you were born in 1957 or later. You may also need a 2nd dose. For females of childbearing age, rubella immunity should be determined. If there is no evidence of immunity, females who are not pregnant should be vaccinated. If there is no evidence of immunity, females who are pregnant should delay immunization until after pregnancy.  Pneumococcal 13-valent conjugate (PCV13) vaccine.** / Consult your health care provider.  Pneumococcal polysaccharide (PPSV23) vaccine.** / 1 to 2 doses if you smoke cigarettes or if you have certain conditions.  Meningococcal vaccine.** /  1 dose if you are age 19 to 21 years and a first-year college  student living in a residence hall, or have one of several medical conditions, you need to get vaccinated against meningococcal disease. You may also need additional booster doses.  Hepatitis A vaccine.** / Consult your health care provider.  Hepatitis B vaccine.** / Consult your health care provider.  Haemophilus influenzae type b (Hib) vaccine.** / Consult your health care provider. Ages 40 to 64 years  Blood pressure check.** / Every 1 to 2 years.  Lipid and cholesterol check.** / Every 5 years beginning at age 20 years.  Lung cancer screening. / Every year if you are aged 55-80 years and have a 30-pack-year history of smoking and currently smoke or have quit within the past 15 years. Yearly screening is stopped once you have quit smoking for at least 15 years or develop a health problem that would prevent you from having lung cancer treatment.  Clinical breast exam.** / Every year after age 40 years.  BRCA-related cancer risk assessment.** / For women who have family members with a BRCA-related cancer (breast, ovarian, tubal, or peritoneal cancers).  Mammogram.** / Every year beginning at age 40 years and continuing for as long as you are in good health. Consult with your health care provider.  Pap test.** / Every 3 years starting at age 30 years through age 65 or 70 years with a history of 3 consecutive normal Pap tests.  HPV screening.** / Every 3 years from ages 30 years through ages 65 to 70 years with a history of 3 consecutive normal Pap tests.  Fecal occult blood test (FOBT) of stool. / Every year beginning at age 50 years and continuing until age 75 years. You may not need to do this test if you get a colonoscopy every 10 years.  Flexible sigmoidoscopy or colonoscopy.** / Every 5 years for a flexible sigmoidoscopy or every 10 years for a colonoscopy beginning at age 50 years and continuing until age 75 years.  Hepatitis C blood test.** / For all people born from 1945 through  1965 and any individual with known risks for hepatitis C.  Skin self-exam. / Monthly.  Influenza vaccine. / Every year.  Tetanus, diphtheria, and acellular pertussis (Tdap/Td) vaccine.** / Consult your health care provider. Pregnant women should receive 1 dose of Tdap vaccine during each pregnancy. 1 dose of Td every 10 years.  Varicella vaccine.** / Consult your health care provider. Pregnant females who do not have evidence of immunity should receive the first dose after pregnancy.  Zoster vaccine.** / 1 dose for adults aged 60 years or older.  Measles, mumps, rubella (MMR) vaccine.** / You need at least 1 dose of MMR if you were born in 1957 or later. You may also need a 2nd dose. For females of childbearing age, rubella immunity should be determined. If there is no evidence of immunity, females who are not pregnant should be vaccinated. If there is no evidence of immunity, females who are pregnant should delay immunization until after pregnancy.  Pneumococcal 13-valent conjugate (PCV13) vaccine.** / Consult your health care provider.  Pneumococcal polysaccharide (PPSV23) vaccine.** / 1 to 2 doses if you smoke cigarettes or if you have certain conditions.  Meningococcal vaccine.** / Consult your health care provider.  Hepatitis A vaccine.** / Consult your health care provider.  Hepatitis B vaccine.** / Consult your health care provider.  Haemophilus influenzae type b (Hib) vaccine.** / Consult your health care provider. Ages 65   years and over  Blood pressure check.** / Every 1 to 2 years.  Lipid and cholesterol check.** / Every 5 years beginning at age 22 years.  Lung cancer screening. / Every year if you are aged 73-80 years and have a 30-pack-year history of smoking and currently smoke or have quit within the past 15 years. Yearly screening is stopped once you have quit smoking for at least 15 years or develop a health problem that would prevent you from having lung cancer  treatment.  Clinical breast exam.** / Every year after age 4 years.  BRCA-related cancer risk assessment.** / For women who have family members with a BRCA-related cancer (breast, ovarian, tubal, or peritoneal cancers).  Mammogram.** / Every year beginning at age 40 years and continuing for as long as you are in good health. Consult with your health care provider.  Pap test.** / Every 3 years starting at age 9 years through age 34 or 91 years with 3 consecutive normal Pap tests. Testing can be stopped between 65 and 70 years with 3 consecutive normal Pap tests and no abnormal Pap or HPV tests in the past 10 years.  HPV screening.** / Every 3 years from ages 57 years through ages 64 or 45 years with a history of 3 consecutive normal Pap tests. Testing can be stopped between 65 and 70 years with 3 consecutive normal Pap tests and no abnormal Pap or HPV tests in the past 10 years.  Fecal occult blood test (FOBT) of stool. / Every year beginning at age 15 years and continuing until age 17 years. You may not need to do this test if you get a colonoscopy every 10 years.  Flexible sigmoidoscopy or colonoscopy.** / Every 5 years for a flexible sigmoidoscopy or every 10 years for a colonoscopy beginning at age 86 years and continuing until age 71 years.  Hepatitis C blood test.** / For all people born from [birth year] through 1965 and any individual with known risks for hepatitis C.  Osteoporosis screening.** / A one-time screening for women ages 83 years and over and women at risk for fractures or osteoporosis.  Skin self-exam. / Monthly.  Influenza vaccine. / Every year.  Tetanus, diphtheria, and acellular pertussis (Tdap/Td) vaccine.** / 1 dose of Td every 10 years.  Varicella vaccine.** / Consult your health care provider.  Zoster vaccine.** / 1 dose for adults aged 61 years or older.  Pneumococcal 13-valent conjugate (PCV13) vaccine.** / Consult your health care provider.  Pneumococcal  polysaccharide (PPSV23) vaccine.** / 1 dose for all adults aged 28 years and older.  Meningococcal vaccine.** / Consult your health care provider.  Hepatitis A vaccine.** / Consult your health care provider.  Hepatitis B vaccine.** / Consult your health care provider.  Haemophilus influenzae type b (Hib) vaccine.** / Consult your health care provider. ** Family history and personal history of risk and conditions may change your health care provider's recommendations. Document Released: 04/23/2001 Document Revised: 07/12/2013 Document Reviewed: 07/23/2010 [facility] Patient Information 2015 [place], [place]. This information is not intended to replace advice given to you by your health care provider. Make sure you discuss any questions you have with your health care provider.

## 2014-03-21 NOTE — Assessment & Plan Note (Signed)
Here for medicare wellness/physical  Diet: heart healthy  Physical activity: not sedentary - active Depression/mood screen: negative  Hearing: intact to whispered voice  Visual acuity: grossly normal, performs annual eye exam  ADLs: capable  Fall risk: none  Home safety: good  Cognitive evaluation: intact to orientation, naming, recall and repetition  EOL planning: adv directives, full code/ I agree  I have personally reviewed and have noted  1. The patient's medical and social history  2. Their use of alcohol, tobacco or illicit drugs  3. Their current medications and supplements  4. The patient's functional ability including ADL's, fall risks, home safety risks and hearing or visual impairment.  5. Diet and physical activities  6. Evidence for depression or mood disorders    Today patient counseled on age appropriate routine health concerns for screening and prevention, each reviewed and up to date or declined. Immunizations reviewed and up to date or declined. Labs ordered and reviewed. Risk factors for depression reviewed and negative. Hearing function and visual acuity are intact. ADLs screened and addressed as needed. Functional ability and level of safety reviewed and appropriate. Education, counseling and referrals performed based on assessed risks today. Patient provided with a copy of personalized plan for preventive services.

## 2014-03-21 NOTE — Progress Notes (Signed)
   Subjective:     HPI  Dr MEN used to be Tressa's PCP New pt  The patient is here for a wellness exam. C/o cough - long term, hacking F/u osteoporosis - was on Fosamax x 20 years  (Dr Altheimer) Jearl Klinefelter D was 24     Review of Systems  Constitutional: Negative for fever, chills, diaphoresis, activity change, appetite change, fatigue and unexpected weight change.  HENT: Positive for postnasal drip and rhinorrhea. Negative for congestion, dental problem, ear pain, hearing loss, mouth sores, sinus pressure, sneezing, sore throat and voice change.   Eyes: Negative for pain and visual disturbance.  Respiratory: Positive for cough. Negative for chest tightness, wheezing and stridor.   Cardiovascular: Negative for chest pain, palpitations and leg swelling.  Gastrointestinal: Negative for nausea, vomiting, abdominal pain, blood in stool, abdominal distention and rectal pain.  Genitourinary: Negative for dysuria, frequency, hematuria, decreased urine volume, vaginal bleeding, vaginal discharge, difficulty urinating, vaginal pain and menstrual problem.  Musculoskeletal: Positive for arthralgias. Negative for back pain, joint swelling, gait problem and neck pain.  Skin: Negative for color change, rash and wound.  Neurological: Negative for dizziness, tremors, syncope, speech difficulty, weakness and light-headedness.  Hematological: Negative for adenopathy.  Psychiatric/Behavioral: Negative for suicidal ideas, hallucinations, behavioral problems, confusion, sleep disturbance, dysphoric mood and decreased concentration. The patient is not nervous/anxious and is not hyperactive.        Objective:   Physical Exam  Constitutional: She appears well-developed. No distress.  HENT:  Head: Normocephalic.  Nose: Nose normal.  Mouth/Throat: Oropharynx is clear and moist.  Eyes: Conjunctivae are normal. Pupils are equal, round, and reactive to light. Right eye exhibits no discharge. Left eye exhibits no  discharge.  Neck: Normal range of motion. Neck supple. No JVD present. No tracheal deviation present. No thyromegaly present.  Cardiovascular: Normal rate, regular rhythm and normal heart sounds.   Pulmonary/Chest: No stridor. No respiratory distress. She has no wheezes.  Abdominal: Soft. Bowel sounds are normal. She exhibits no distension and no mass. There is no tenderness. There is no rebound and no guarding.  Musculoskeletal: She exhibits no edema or tenderness.  Lymphadenopathy:    She has no cervical adenopathy.  Neurological: She displays normal reflexes. No cranial nerve deficit. She exhibits normal muscle tone. Coordination normal.  Skin: No rash noted. No erythema.  Psychiatric: She has a normal mood and affect. Her behavior is normal. Judgment and thought content normal.   Wax B   Procedure Note :     Procedure :  Ear irrigation   Indication:  Cerumen impaction   Risks, including pain, dizziness, eardrum perforation, bleeding, infection and others as well as benefits were explained to the patient in detail. Verbal consent was obtained and the patient agreed to proceed.    We used "The Elephant Ear Irrigation Device" filled with lukewarm water for irrigation. A large amount wax was recovered. Procedure has also required manual wax removal with an ear loop.   Tolerated well. Complications: None.   Postprocedure instructions :  Call if problems.      Assessment & Plan:

## 2014-03-21 NOTE — Progress Notes (Signed)
Pre visit review using our clinic review tool, if applicable. No additional management support is needed unless otherwise documented below in the visit note. 

## 2014-03-21 NOTE — Assessment & Plan Note (Signed)
CXR Claritin, nasal spray

## 2014-04-28 ENCOUNTER — Emergency Department (HOSPITAL_COMMUNITY): Payer: Medicare Other

## 2014-04-28 ENCOUNTER — Inpatient Hospital Stay (HOSPITAL_COMMUNITY)
Admission: EM | Admit: 2014-04-28 | Discharge: 2014-05-11 | DRG: 330 | Disposition: A | Discharge status: Home Health | Payer: Medicare Other | Attending: Internal Medicine | Admitting: Internal Medicine

## 2014-04-28 ENCOUNTER — Encounter (HOSPITAL_COMMUNITY): Payer: Self-pay | Admitting: Emergency Medicine

## 2014-04-28 DIAGNOSIS — Z885 Allergy status to narcotic agent status: Secondary | ICD-10-CM | POA: Diagnosis not present

## 2014-04-28 DIAGNOSIS — Z79899 Other long term (current) drug therapy: Secondary | ICD-10-CM

## 2014-04-28 DIAGNOSIS — R1032 Left lower quadrant pain: Secondary | ICD-10-CM | POA: Diagnosis present

## 2014-04-28 DIAGNOSIS — Z6821 Body mass index (BMI) 21.0-21.9, adult: Secondary | ICD-10-CM

## 2014-04-28 DIAGNOSIS — K5732 Diverticulitis of large intestine without perforation or abscess without bleeding: Secondary | ICD-10-CM | POA: Diagnosis present

## 2014-04-28 DIAGNOSIS — K567 Ileus, unspecified: Secondary | ICD-10-CM | POA: Diagnosis not present

## 2014-04-28 DIAGNOSIS — F419 Anxiety disorder, unspecified: Secondary | ICD-10-CM | POA: Diagnosis present

## 2014-04-28 DIAGNOSIS — E876 Hypokalemia: Secondary | ICD-10-CM | POA: Diagnosis not present

## 2014-04-28 DIAGNOSIS — Z882 Allergy status to sulfonamides status: Secondary | ICD-10-CM

## 2014-04-28 DIAGNOSIS — E441 Mild protein-calorie malnutrition: Secondary | ICD-10-CM | POA: Diagnosis present

## 2014-04-28 DIAGNOSIS — R079 Chest pain, unspecified: Secondary | ICD-10-CM

## 2014-04-28 DIAGNOSIS — Z791 Long term (current) use of non-steroidal anti-inflammatories (NSAID): Secondary | ICD-10-CM | POA: Diagnosis not present

## 2014-04-28 DIAGNOSIS — Z833 Family history of diabetes mellitus: Secondary | ICD-10-CM | POA: Diagnosis not present

## 2014-04-28 DIAGNOSIS — K631 Perforation of intestine (nontraumatic): Secondary | ICD-10-CM

## 2014-04-28 DIAGNOSIS — K5720 Diverticulitis of large intestine with perforation and abscess without bleeding: Secondary | ICD-10-CM | POA: Diagnosis present

## 2014-04-28 DIAGNOSIS — E559 Vitamin D deficiency, unspecified: Secondary | ICD-10-CM | POA: Diagnosis present

## 2014-04-28 DIAGNOSIS — Z800 Family history of malignant neoplasm of digestive organs: Secondary | ICD-10-CM | POA: Diagnosis not present

## 2014-04-28 DIAGNOSIS — H409 Unspecified glaucoma: Secondary | ICD-10-CM | POA: Diagnosis present

## 2014-04-28 DIAGNOSIS — D62 Acute posthemorrhagic anemia: Secondary | ICD-10-CM | POA: Diagnosis not present

## 2014-04-28 DIAGNOSIS — Z90710 Acquired absence of both cervix and uterus: Secondary | ICD-10-CM

## 2014-04-28 DIAGNOSIS — Z87442 Personal history of urinary calculi: Secondary | ICD-10-CM | POA: Diagnosis not present

## 2014-04-28 DIAGNOSIS — Z801 Family history of malignant neoplasm of trachea, bronchus and lung: Secondary | ICD-10-CM | POA: Diagnosis not present

## 2014-04-28 DIAGNOSIS — R109 Unspecified abdominal pain: Secondary | ICD-10-CM

## 2014-04-28 DIAGNOSIS — G43009 Migraine without aura, not intractable, without status migrainosus: Secondary | ICD-10-CM | POA: Diagnosis present

## 2014-04-28 DIAGNOSIS — E860 Dehydration: Secondary | ICD-10-CM | POA: Diagnosis present

## 2014-04-28 DIAGNOSIS — Z5320 Procedure and treatment not carried out because of patient's decision for unspecified reasons: Secondary | ICD-10-CM

## 2014-04-28 DIAGNOSIS — Z8249 Family history of ischemic heart disease and other diseases of the circulatory system: Secondary | ICD-10-CM | POA: Diagnosis not present

## 2014-04-28 DIAGNOSIS — K56699 Other intestinal obstruction unspecified as to partial versus complete obstruction: Secondary | ICD-10-CM | POA: Diagnosis present

## 2014-04-28 DIAGNOSIS — K219 Gastro-esophageal reflux disease without esophagitis: Secondary | ICD-10-CM | POA: Insufficient documentation

## 2014-04-28 DIAGNOSIS — M1990 Unspecified osteoarthritis, unspecified site: Secondary | ICD-10-CM | POA: Diagnosis present

## 2014-04-28 DIAGNOSIS — M810 Age-related osteoporosis without current pathological fracture: Secondary | ICD-10-CM | POA: Diagnosis present

## 2014-04-28 LAB — COMPREHENSIVE METABOLIC PANEL
ALBUMIN: 3.4 g/dL — AB (ref 3.5–5.2)
ALK PHOS: 68 U/L (ref 39–117)
ALT: 15 U/L (ref 0–35)
AST: 20 U/L (ref 0–37)
Anion gap: 5 (ref 5–15)
BUN: 39 mg/dL — ABNORMAL HIGH (ref 6–23)
CHLORIDE: 106 mmol/L (ref 96–112)
CO2: 25 mmol/L (ref 19–32)
Calcium: 8.6 mg/dL (ref 8.4–10.5)
Creatinine, Ser: 0.86 mg/dL (ref 0.50–1.10)
GFR calc Af Amer: 76 mL/min — ABNORMAL LOW (ref >90)
GFR calc non Af Amer: 65 mL/min — ABNORMAL LOW (ref >90)
GLUCOSE: 141 mg/dL — AB (ref 70–99)
Potassium: 4.2 mmol/L (ref 3.5–5.1)
SODIUM: 136 mmol/L (ref 135–145)
Total Bilirubin: 0.7 mg/dL (ref 0.3–1.2)
Total Protein: 6.6 g/dL (ref 6.0–8.3)

## 2014-04-28 LAB — URINALYSIS, ROUTINE W REFLEX MICROSCOPIC
Bilirubin Urine: NEGATIVE
Glucose, UA: NEGATIVE mg/dL
Hgb urine dipstick: NEGATIVE
KETONES UR: NEGATIVE mg/dL
LEUKOCYTES UA: NEGATIVE
NITRITE: NEGATIVE
PROTEIN: NEGATIVE mg/dL
Specific Gravity, Urine: >1.046 — ABNORMAL HIGH (ref 1.005–1.030)
Urobilinogen, UA: 0.2 mg/dL (ref 0.0–1.0)
pH: 5 (ref 5.0–8.0)

## 2014-04-28 LAB — CBC WITH DIFFERENTIAL/PLATELET
BASOS ABS: 0 10*3/uL (ref 0.0–0.1)
Basophils Relative: 0 % (ref 0–1)
EOS ABS: 0 10*3/uL (ref 0.0–0.7)
EOS PCT: 0 % (ref 0–5)
HCT: 43.6 % (ref 36.0–46.0)
HEMOGLOBIN: 14.4 g/dL (ref 12.0–15.0)
Lymphocytes Relative: 15 % (ref 12–46)
Lymphs Abs: 0.8 10*3/uL (ref 0.7–4.0)
MCH: 30.3 pg (ref 26.0–34.0)
MCHC: 33 g/dL (ref 30.0–36.0)
MCV: 91.8 fL (ref 78.0–100.0)
Monocytes Absolute: 0.2 10*3/uL (ref 0.1–1.0)
Monocytes Relative: 3 % (ref 3–12)
Neutro Abs: 4.5 10*3/uL (ref 1.7–7.7)
Neutrophils Relative %: 82 % — ABNORMAL HIGH (ref 43–77)
Platelets: 255 10*3/uL (ref 150–400)
RBC: 4.75 MIL/uL (ref 3.87–5.11)
RDW: 13.5 % (ref 11.5–15.5)
WBC: 5.5 10*3/uL (ref 4.0–10.5)

## 2014-04-28 LAB — I-STAT TROPONIN, ED: Troponin i, poc: 0.01 ng/mL (ref 0.00–0.08)

## 2014-04-28 LAB — LIPASE, BLOOD: Lipase: 26 U/L (ref 11–59)

## 2014-04-28 MED ORDER — IOHEXOL 300 MG/ML  SOLN
100.0000 mL | Freq: Once | INTRAMUSCULAR | Status: AC | PRN
Start: 2014-04-28 — End: 2014-04-28
  Administered 2014-04-28: 100 mL via INTRAVENOUS

## 2014-04-28 MED ORDER — ACETAMINOPHEN 325 MG PO TABS: 650.0000 mg | ORAL_TABLET | Freq: Four times a day (QID) | ORAL | Status: DC | PRN

## 2014-04-28 MED ORDER — MENTHOL 3 MG MT LOZG: 1.0000 | LOZENGE | OROMUCOSAL | Status: DC | PRN

## 2014-04-28 MED ORDER — MAGIC MOUTHWASH
15.0000 mL | Freq: Four times a day (QID) | ORAL | Status: DC | PRN
  Filled 2014-04-28: qty 15

## 2014-04-28 MED ORDER — ONDANSETRON HCL 4 MG/2ML IJ SOLN
4.0000 mg | Freq: Four times a day (QID) | INTRAMUSCULAR | Status: DC | PRN
  Administered 2014-04-28 – 2014-05-01 (×4): 4 mg via INTRAVENOUS
  Filled 2014-04-28 (×4): qty 2

## 2014-04-28 MED ORDER — ONDANSETRON HCL 4 MG/2ML IJ SOLN
4.0000 mg | Freq: Once | INTRAMUSCULAR | Status: AC
  Administered 2014-04-28: 4 mg via INTRAVENOUS
  Filled 2014-04-28: qty 2

## 2014-04-28 MED ORDER — ALUM & MAG HYDROXIDE-SIMETH 200-200-20 MG/5ML PO SUSP: 30.0000 mL | Freq: Four times a day (QID) | ORAL | Status: DC | PRN

## 2014-04-28 MED ORDER — PROMETHAZINE HCL 25 MG/ML IJ SOLN
6.2500 mg | INTRAMUSCULAR | Status: DC | PRN
  Filled 2014-04-28: qty 1

## 2014-04-28 MED ORDER — METOPROLOL TARTRATE 1 MG/ML IV SOLN
5.0000 mg | Freq: Four times a day (QID) | INTRAVENOUS | Status: DC | PRN
  Filled 2014-04-28: qty 5

## 2014-04-28 MED ORDER — FENTANYL CITRATE 0.05 MG/ML IJ SOLN
50.0000 ug | INTRAMUSCULAR | Status: DC | PRN
  Administered 2014-04-28: 50 ug via INTRAVENOUS
  Filled 2014-04-28: qty 2

## 2014-04-28 MED ORDER — MORPHINE SULFATE 2 MG/ML IJ SOLN
1.0000 mg | INTRAMUSCULAR | Status: DC | PRN
  Administered 2014-04-28 – 2014-05-03 (×12): 1 mg via INTRAVENOUS
  Filled 2014-04-28 (×12): qty 1

## 2014-04-28 MED ORDER — SACCHAROMYCES BOULARDII 250 MG PO CAPS: 250.0000 mg | ORAL_CAPSULE | Freq: Two times a day (BID) | ORAL | Status: DC

## 2014-04-28 MED ORDER — DIPHENHYDRAMINE HCL 50 MG/ML IJ SOLN: 12.5000 mg | Freq: Four times a day (QID) | INTRAMUSCULAR | Status: DC | PRN

## 2014-04-28 MED ORDER — ACETAMINOPHEN 650 MG RE SUPP: 650.0000 mg | Freq: Four times a day (QID) | RECTAL | Status: DC | PRN

## 2014-04-28 MED ORDER — ONDANSETRON HCL 4 MG/2ML IJ SOLN
4.0000 mg | Freq: Four times a day (QID) | INTRAMUSCULAR | Status: DC | PRN
  Administered 2014-04-28: 4 mg via INTRAVENOUS
  Filled 2014-04-28: qty 2

## 2014-04-28 MED ORDER — ONDANSETRON 8 MG/NS 50 ML IVPB
8.0000 mg | Freq: Four times a day (QID) | INTRAVENOUS | Status: DC | PRN
  Filled 2014-04-28: qty 8

## 2014-04-28 MED ORDER — PIPERACILLIN-TAZOBACTAM 3.375 G IVPB
3.3750 g | Freq: Three times a day (TID) | INTRAVENOUS | Status: DC
  Filled 2014-04-28: qty 50

## 2014-04-28 MED ORDER — FENTANYL CITRATE 0.05 MG/ML IJ SOLN
50.0000 ug | Freq: Once | INTRAMUSCULAR | Status: AC
  Administered 2014-04-28: 50 ug via INTRAVENOUS
  Filled 2014-04-28: qty 2

## 2014-04-28 MED ORDER — PIPERACILLIN-TAZOBACTAM 3.375 G IVPB 30 MIN
3.3750 g | Freq: Once | INTRAVENOUS | Status: AC
  Administered 2014-04-28: 3.375 g via INTRAVENOUS
  Filled 2014-04-28: qty 50

## 2014-04-28 MED ORDER — LIP MEDEX EX OINT: 1.0000 "application " | TOPICAL_OINTMENT | Freq: Two times a day (BID) | CUTANEOUS | Status: DC

## 2014-04-28 MED ORDER — PIPERACILLIN-TAZOBACTAM 3.375 G IVPB
3.3750 g | Freq: Three times a day (TID) | INTRAVENOUS | Status: DC
  Administered 2014-04-29 – 2014-05-03 (×13): 3.375 g via INTRAVENOUS
  Filled 2014-04-28 (×16): qty 50

## 2014-04-28 MED ORDER — DEXTROSE-NACL 5-0.9 % IV SOLN
INTRAVENOUS | Status: DC
  Administered 2014-04-28 – 2014-04-29 (×2): via INTRAVENOUS

## 2014-04-28 MED ORDER — LACTATED RINGERS IV BOLUS (SEPSIS)
1000.0000 mL | Freq: Once | INTRAVENOUS | Status: AC
  Administered 2014-04-28: 1000 mL via INTRAVENOUS

## 2014-04-28 MED ORDER — ONDANSETRON HCL 4 MG PO TABS: 4.0000 mg | ORAL_TABLET | Freq: Four times a day (QID) | ORAL | Status: DC | PRN

## 2014-04-28 MED ORDER — SODIUM CHLORIDE 0.9 % IV BOLUS (SEPSIS)
500.0000 mL | Freq: Once | INTRAVENOUS | Status: AC
  Administered 2014-04-28: 500 mL via INTRAVENOUS

## 2014-04-28 MED ORDER — METRONIDAZOLE IN NACL 5-0.79 MG/ML-% IV SOLN: 500.0000 mg | Freq: Four times a day (QID) | INTRAVENOUS | Status: DC

## 2014-04-28 MED ORDER — PHENOL 1.4 % MT LIQD
2.0000 | OROMUCOSAL | Status: DC | PRN
Start: 2014-04-28 — End: 2014-04-28

## 2014-04-28 MED ORDER — ACETAMINOPHEN 325 MG PO TABS: 325.0000 mg | ORAL_TABLET | Freq: Four times a day (QID) | ORAL | Status: DC | PRN

## 2014-04-28 MED ORDER — ONDANSETRON 8 MG PO TBDP
8.0000 mg | ORAL_TABLET | Freq: Once | ORAL | Status: DC
  Filled 2014-04-28: qty 1

## 2014-04-28 MED ORDER — LACTATED RINGERS IV BOLUS (SEPSIS): 1000.0000 mL | Freq: Three times a day (TID) | INTRAVENOUS | Status: DC | PRN

## 2014-04-28 MED ORDER — ONDANSETRON HCL 4 MG PO TABS
4.0000 mg | ORAL_TABLET | Freq: Four times a day (QID) | ORAL | Status: DC | PRN
Start: 2014-04-28 — End: 2014-05-03

## 2014-04-28 NOTE — Progress Notes (Addendum)
ANTIBIOTIC CONSULT NOTE - INITIAL  Pharmacy Consult for Zosyn Indication: diverticulitis with perforation  Allergies  Allergen Reactions  . Codeine Nausea And Vomiting  . Sulfonamide Derivatives Other (See Comments)    Childhood allergy; reaction unknown    Patient Measurements:   Adjusted Body Weight: n/a  Vital Signs: Temp: 98.1 F (36.7 C) (02/18 1717) Temp Source: Rectal (02/18 1717) BP: 114/56 mmHg (02/18 1908) Pulse Rate: 82 (02/18 1908) Intake/Output from previous day:   Intake/Output from this shift:    Labs:  Recent Labs  04/28/14 1745  WBC 5.5  HGB 14.4  PLT 255  CREATININE 0.86   CrCl cannot be calculated (Unknown ideal weight.). No results for input(s): VANCOTROUGH, VANCOPEAK, VANCORANDOM, GENTTROUGH, GENTPEAK, GENTRANDOM, TOBRATROUGH, TOBRAPEAK, TOBRARND, AMIKACINPEAK, AMIKACINTROU, AMIKACIN in the last 72 hours.   Microbiology: No results found for this or any previous visit (from the past 720 hour(s)).  Medical History: Past Medical History  Diagnosis Date  . Hematuria   . Tremor   . Hair loss   . Unspecified vitamin D deficiency   . Migraines   . Osteoporosis   . Osteoarthritis   . Allergic rhinitis   . Glaucoma     Medications:  Scheduled:  . lip balm  1 application Topical BID  . [START ON 04/29/2014] saccharomyces boulardii  250 mg Oral BID   Infusions:  . lactated ringers    . lactated ringers    . piperacillin-tazobactam 3.375 g (04/28/14 2103)  . [START ON 04/29/2014] piperacillin-tazobactam (ZOSYN)  IV     PRN: acetaminophen, alum & mag hydroxide-simeth, diphenhydrAMINE, lactated ringers, magic mouthwash, menthol-cetylpyridinium, metoprolol, ondansetron (ZOFRAN) IV **OR** ondansetron (ZOFRAN) IV, ondansetron, phenol, promethazine Anti-infectives    Start     Dose/Rate Route Frequency Ordered Stop   04/29/14 0400  piperacillin-tazobactam (ZOSYN) IVPB 3.375 g     3.375 g 12.5 mL/hr over 240 Minutes Intravenous 3 times per day  04/28/14 2103     04/28/14 2100  metroNIDAZOLE (FLAGYL) IVPB 500 mg  Status:  Discontinued     500 mg 100 mL/hr over 60 Minutes Intravenous Every 6 hours 04/28/14 2049 04/28/14 2053   04/28/14 2045  piperacillin-tazobactam (ZOSYN) IVPB 3.375 g     3.375 g 100 mL/hr over 30 Minutes Intravenous  Once 04/28/14 2043       Assessment: 73yoF admitted for diverticulitis w/ perforation.  Receiving Zosyn 3.375 g x 1 in ED; consulted to begin Zosyn for intra-abdominal infection  2/18 CT abd: Acute sigmoid colon diverticulitis. There are findings of perforation of a diverticulum with adjacent extraluminal air as well as a small amount of free air. There is no evidence of an abscess  2/18 >> Zosyn >>    Goal of Therapy:   Eradication of infection  Appropriate antibiotic dosing for indication and renal function  Plan:   Zosyn 3.375 g IV q8hr, given over 4 hours; to start 6 hrs after ED dose  Follow clinical course, culture results as available, renal function   Reuel Boom, PharmD Pager: [phone] 04/28/2014, 9:07 PM

## 2014-04-28 NOTE — ED Notes (Signed)
Admitting Dr. Arville Go aware of pt. Request for something for her bowel movements. She is attempting to strain. Dr. Discussed.

## 2014-04-28 NOTE — ED Notes (Signed)
Wendall Mola unsuccessful attempt. This RN will attempt again.

## 2014-04-28 NOTE — ED Notes (Signed)
Pt reports left lower abdominal pain consistent to past pain felt with kidney stone starting at 1400 today. Pt continues to reports pt radiating to generalized abdominal and epigastric region. Pt denies "chest pain" and reports hx of GERD.

## 2014-04-28 NOTE — ED Notes (Addendum)
Pt reports severe abdominal pain starting around 1400 on the left lower side and now is on the right lower side. Denies difficulty urinating. Denies pain radiates to the back but does radiate to the chest. Hx acid reflux. Ate food around 1200. Has had tingling in her feet recently. Last bowel movement was today (small amount). Hx kidney stones in 2012 but unsure if she ever passed stone. Was told it could be diverticulitis. Patient appears pale and weak. RR even/unlabored. No other c/c.

## 2014-04-28 NOTE — ED Notes (Signed)
Pt transported to DG.  

## 2014-04-28 NOTE — Consult Note (Addendum)
[address] [place], [place] [address] Phone: [phone] FAX: [place]  [birth year] [id number]  CARE TEAM:  PCP: Walker Kehr, MD  Outpatient Care Team: [name], [name], [name], [name], [name], [name], [name]  Inpatient Treatment Team: Treatment Team: Attending Provider: Pamella Pert, MD; Technician: Chrisandra Carota, NT; Registered Nurse: Loma Sender Drewery, RN; Registered Nurse: Mosie Lukes, RN  This patient is a 74 y.o.female who presents today for surgical evaluation at the request of [facility] ED Dr. Aline Brochure  Reason for evaluation: Perforated diverticulitis  Elderly woman with history of diverticulosis by prior colonoscopies.  Has had occasional irregular bowels but not severe.  Dwindling tolerance to oral bowel prep, so she has held off on a follow-up colonoscopy in 7 years.  Eating pistachio nuts last weekend.  Had a little mild discomfort for a few days.  Then had definite intense lower abdominal pain at "2:06pm" today.  The patient does not recall any prior episodes of diverticulitis.  She recalls having kidney stones and wondered if she had an attack.  Because the pain was rather intense, she came to the emergency room.  Evidence of abdominal pain.  CAT scan concerning for diverticulitis with microperforation and numerous regions.  Not hemodynamically in shock.  Surgical consultation requested.  Recommendation made for hospitalization given perforation.  Patient is rather physically active and exercises regularly.  No history of cardiopulmonary issues.  Usually has a bowel movement every other day to a few a day.  No severe episodes of bleeding.   She has had hysterectomy and oophrectomy.  These were in the 1970s.  No other abdominal surgery.  No history of Crohn's nor ulcerative colitis.  Possible history of colon polyps has been recommended to have colonoscopy every 5 years.  No history of sick contacts or recent travel history.  No history of C. difficile.  No history of colitis.  No hematochezia or melena.  She did have some nausea and retching with this abdominal pain and felt rather dehydrated.  Since she received IV fluids, she is feeling better.    Past Medical History  Diagnosis Date  . Hematuria   . Tremor   . Hair loss   . Unspecified vitamin D deficiency   . Migraines   . Osteoporosis   . Osteoarthritis   . Allergic rhinitis   . Glaucoma     Past Surgical History  Procedure Laterality Date  . Abdominal hysterectomy      1972  . Oophorectomy      1976  . Tonsillectomy    . Excision of lipoma      left scapula area  . Reduction mammoplasty      1994  . Rhinoplasty      1985  . Facial cosmetic surgery      '10 [name] MD in [place])    History   Social History  . Marital Status: Married    Spouse Name: N/A  . Number of Children: N/A  . Years of Education: N/A   Occupational History  . Not on file.   Social History Main Topics  . Smoking status: Never Smoker   . Smokeless tobacco: Never Used  . Alcohol Use: No  .  Drug Use: No  . Sexual Activity: Not on file   Other Topics Concern  . Not on file   [place]- education   Married '64   Work: Pharmacist, hospital, [employer] of [employer] in [place], retired   2 adopted sons '69, '71   Marriage in good health- Life is good    Family History  Problem Relation Age of Onset  . Arthritis Mother   . Colon cancer Mother 58  . Heart disease Father   . Dementia Father   . Diabetes Sister   . Cancer Maternal Grandfather     Lung cancer  . Stomach cancer Neg Hx     Current Facility-Administered Medications   Medication Dose Route Frequency Provider Last Rate Last Dose  . acetaminophen (TYLENOL) tablet 325-650 mg  325-650 mg Oral Q6H PRN Michael Boston, MD      . alum & mag hydroxide-simeth (MAALOX/MYLANTA) 200-200-20 MG/5ML suspension 30 mL  30 mL Oral Q6H PRN Michael Boston, MD      . diphenhydrAMINE (BENADRYL) injection 12.5-25 mg  12.5-25 mg Intravenous Q6H PRN Michael Boston, MD      . lactated ringers bolus 1,000 mL  1,000 mL Intravenous Q8H PRN Michael Boston, MD      . lactated ringers bolus 1,000 mL  1,000 mL Intravenous Once Michael Boston, MD      . lip balm (CARMEX) ointment 1 application  1 application Topical BID Michael Boston, MD      . magic mouthwash  15 mL Oral QID PRN Michael Boston, MD      . menthol-cetylpyridinium (CEPACOL) lozenge 3 mg  1 lozenge Oral PRN Michael Boston, MD      . metoprolol (LOPRESSOR) injection 5 mg  5 mg Intravenous Q6H PRN Michael Boston, MD      . metroNIDAZOLE (FLAGYL) IVPB 500 mg  500 mg Intravenous Q6H Michael Boston, MD      . ondansetron [facility]) injection 4 mg  4 mg Intravenous Q6H PRN Michael Boston, MD       Or  . ondansetron (ZOFRAN) 8 mg/NS 50 ml IVPB  8 mg Intravenous Q6H PRN Michael Boston, MD      . ondansetron [facility]) tablet 4 mg  4 mg Oral Q6H PRN Michael Boston, MD      . phenol (CHLORASEPTIC) mouth spray 2 spray  2 spray Mouth/Throat PRN Michael Boston, MD      . piperacillin-tazobactam (ZOSYN) IVPB 3.375 g  3.375 g Intravenous Once Pamella Pert, MD      . promethazine (PHENERGAN) injection 6.25-12.5 mg  6.25-12.5 mg Intravenous Q4H PRN Michael Boston, MD      . saccharomyces boulardii (FLORASTOR) capsule 250 mg  250 mg Oral BID Michael Boston, MD       Current Outpatient Prescriptions  Medication Sig Dispense Refill  . ergocalciferol (VITAMIN D2) 50000 UNITS capsule Take 1 capsule (50,000 Units total) by mouth once a week. (Patient taking differently: Take 50,000 Units by mouth once a week. Thursday.) 6 capsule 0  . naproxen sodium (ANAPROX) 220 MG tablet Take  220 mg by mouth once.    Marland Kitchen oxymetazoline (AFRIN) 0.05 % nasal spray Place 1 spray into both nostrils daily as needed for congestion.    . Cholecalciferol (VITAMIN D3) 2000 UNITS capsule Take 1 capsule (2,000 Units total) by mouth daily. (Patient not taking: Reported on 04/28/2014) 100 capsule 3     Allergies  Allergen Reactions  . Codeine Nausea And  Vomiting  . Sulfonamide Derivatives Other (See Comments)    Childhood allergy; reaction unknown    ROS: Constitutional:  No fevers, chills, sweats.  Weight stable Eyes:  No vision changes, No discharge HENT:  No sore throats, nasal drainage Lymph: No neck swelling, No bruising easily Pulmonary:  No cough, productive sputum CV: No orthopnea, PND  Patient walks 60 minutes for about 2 miles without difficulty.  No exertional chest/neck/shoulder/arm pain. GI:  No personal nor family history of GI/colon cancer, inflammatory bowel disease, irritable bowel syndrome, allergy such as Celiac Sprue, dietary/dairy problems, colitis, ulcers nor gastritis.  No recent sick contacts/gastroenteritis.  No travel outside the country.  No changes in diet. Renal: No UTIs, No hematuria Genital:  No drainage, bleeding, masses Musculoskeletal: No severe joint pain.  Good ROM major joints Skin:  No sores or lesions.  No rashes Heme/Lymph:  No easy bleeding.  No swollen lymph nodes Neuro: No focal weakness/numbness.  No seizures Psych: No suicidal ideation.  No hallucinations  BP 114/56 mmHg  Pulse 82  Temp(Src) 98.1 F (36.7 C) (Rectal)  Resp 18  SpO2 100%  Physical Exam: General: Pt awake/alert/oriented x4 in no major acute distress Eyes: PERRL, normal EOM. Sclera nonicteric Neuro: CN II-XII intact w/o focal sensory/motor deficits. Lymph: No head/neck/groin lymphadenopathy Psych:  No delerium/psychosis/paranoia.  Talkative and inquisitive.  A little self deprecating/apologetic.  Occasionally interrupts.  Consolable.   HENT: Normocephalic, Mucus membranes  moist.  No thrush Neck: Supple, No tracheal deviation Chest: No pain.  Good respiratory excursion. CV:  Pulses intact.  Regular rhythm Abdomen: Soft, Mildly distended.  Moderately TTP BLQ with mild guarding.  No incarcerated hernias.  GU: No vaginal bleeding/discharge.  No inguinal hernias.   Ext:  SCDs BLE.  No significant edema.  No cyanosis Skin: No petechiae / purpurea.  No major sores Musculoskeletal: No severe joint pain.  Good ROM major joints   Results:   Labs: Results for orders placed or performed during the hospital encounter of 04/28/14 (from the past 48 hour(s))  CBC with Differential     Status: Abnormal   Collection Time: 04/28/14  5:45 PM  Result Value Ref Range   WBC 5.5 4.0 - 10.5 K/uL   RBC 4.75 3.87 - 5.11 MIL/uL   Hemoglobin 14.4 12.0 - 15.0 g/dL   HCT 43.6 36.0 - 46.0 %   MCV 91.8 78.0 - 100.0 fL   MCH 30.3 26.0 - 34.0 pg   MCHC 33.0 30.0 - 36.0 g/dL   RDW 13.5 11.5 - 15.5 %   Platelets 255 150 - 400 K/uL   Neutrophils Relative % 82 (H) 43 - 77 %   Neutro Abs 4.5 1.7 - 7.7 K/uL   Lymphocytes Relative 15 12 - 46 %   Lymphs Abs 0.8 0.7 - 4.0 K/uL   Monocytes Relative 3 3 - 12 %   Monocytes Absolute 0.2 0.1 - 1.0 K/uL   Eosinophils Relative 0 0 - 5 %   Eosinophils Absolute 0.0 0.0 - 0.7 K/uL   Basophils Relative 0 0 - 1 %   Basophils Absolute 0.0 0.0 - 0.1 K/uL  Comprehensive metabolic panel     Status: Abnormal   Collection Time: 04/28/14  5:45 PM  Result Value Ref Range   Sodium 136 135 - 145 mmol/L   Potassium 4.2 3.5 - 5.1 mmol/L   Chloride 106 96 - 112 mmol/L   CO2 25 19 - 32 mmol/L   Glucose, Bld 141 (H) 70 - 99  mg/dL   BUN 39 (H) 6 - 23 mg/dL   Creatinine, Ser 0.86 0.50 - 1.10 mg/dL   Calcium 8.6 8.4 - 10.5 mg/dL   Total Protein 6.6 6.0 - 8.3 g/dL   Albumin 3.4 (L) 3.5 - 5.2 g/dL   AST 20 0 - 37 U/L   ALT 15 0 - 35 U/L   Alkaline Phosphatase 68 39 - 117 U/L   Total Bilirubin 0.7 0.3 - 1.2 mg/dL   GFR calc non Af Amer 65 (L) >90 mL/min    GFR calc Af Amer 76 (L) >90 mL/min    Comment: (NOTE) The eGFR has been calculated using the CKD EPI equation. This calculation has not been validated in all clinical situations. eGFR's persistently <90 mL/min signify possible Chronic Kidney Disease.    Anion gap 5 5 - 15  Lipase, blood     Status: None   Collection Time: 04/28/14  5:45 PM  Result Value Ref Range   Lipase 26 11 - 59 U/L  I-stat troponin, ED (only if pt is 74 y.o. or older & pain is above umbilicus) - do not order at [facility]     Status: None   Collection Time: 04/28/14  5:59 PM  Result Value Ref Range   Troponin i, poc 0.01 0.00 - 0.08 ng/mL   Comment 3            Comment: Due to the release kinetics of cTnI, a negative result within the first hours of the onset of symptoms does not rule out myocardial infarction with certainty. If myocardial infarction is still suspected, repeat the test at appropriate intervals.   Urinalysis, Routine w reflex microscopic     Status: Abnormal   Collection Time: 04/28/14  6:42 PM  Result Value Ref Range   Color, Urine YELLOW YELLOW   APPearance CLEAR CLEAR   Specific Gravity, Urine >1.046 (H) 1.005 - 1.030   pH 5.0 5.0 - 8.0   Glucose, UA NEGATIVE NEGATIVE mg/dL   Hgb urine dipstick NEGATIVE NEGATIVE   Bilirubin Urine NEGATIVE NEGATIVE   Ketones, ur NEGATIVE NEGATIVE mg/dL   Protein, ur NEGATIVE NEGATIVE mg/dL   Urobilinogen, UA 0.2 0.0 - 1.0 mg/dL   Nitrite NEGATIVE NEGATIVE   Leukocytes, UA NEGATIVE NEGATIVE    Comment: MICROSCOPIC NOT DONE ON URINES WITH NEGATIVE PROTEIN, BLOOD, LEUKOCYTES, NITRITE, OR GLUCOSE <1000 mg/dL.    Imaging / Studies: Dg Chest 2 View  04/28/2014   CLINICAL DATA:  Severe abdominal pain beginning at 1400 hr today.  EXAM: CHEST  2 VIEW  COMPARISON:  PA and lateral chest 03/21/2014 and 12/25/2012.  FINDINGS: Mild elevation of the right hemidiaphragm relative to the left is unchanged. Lungs are clear. Heart size is normal. No pneumothorax or pleural  effusion.  IMPRESSION: No acute disease.   Electronically Signed   By: Inge Rise M.D.   On: 04/28/2014 19:12   Ct Abdomen Pelvis W Contrast  04/28/2014   CLINICAL DATA:  Low abd pain onset 1400 today, n/v, h/o abdominal hysterectomy, oophorectomy, GERD  EXAM: CT ABDOMEN AND PELVIS WITH CONTRAST  TECHNIQUE: Multidetector CT imaging of the abdomen and pelvis was performed using the standard protocol following bolus administration of intravenous contrast.  CONTRAST:  185m OMNIPAQUE IOHEXOL 300 MG/ML  SOLN  COMPARISON:  07/02/2010  FINDINGS: There is wall thickening along the mid to lower sigmoid colon where there are multiple diverticula. There is adjacent significant inflammatory stranding. Small bubbles of extraluminal air lie anterior to the  mid sigmoid colon with several others noted along the anterior peritoneal cavity extending all the way superior to lie anterior to the liver. There is no formed fluid collection to suggest an abscess. Findings are most consistent with perforated diverticulitis.  There is dependent subsegmental atelectasis in the lower lobes. No convincing pneumonia or edema.  Low-density un enhancing liver lesions are noted consistent with cysts. There are 3 low-density splenic lesions which may also reflect cysts. There also splenic calcifications consistent with healed granuloma. Spleen is normal in size.  Gallbladder pancreas are unremarkable. No bile duct dilation. No adrenal masses.  Multiple bilateral renal cortical and renal sinus cysts. No hydronephrosis. Ureters normal in course and in caliber. Bladder is unremarkable.  Uterus surgically absent.  No pelvic masses.  There are several prominent mesenteric lymph nodes largest measuring 7 mm in short axis.  Small amount of ascites most of which collects in the posterior pelvic recess.  Chronic bilateral pars defects at L5-S1 with a grade 1 anterolisthesis. There are degenerative changes most evident in the lower lumbar spine. No  osteoblastic or osteolytic lesions.  IMPRESSION: 1. Acute sigmoid colon diverticulitis. There are findings of perforation of a diverticulum with adjacent extraluminal air as well as a small amount of free air. There is no evidence of an abscess. 2. No other acute findings.   Electronically Signed   By: Lajean Manes M.D.   On: 04/28/2014 19:51    Medications / Allergies: per chart  Antibiotics: Anti-infectives    Start     Dose/Rate Route Frequency Ordered Stop   04/28/14 2100  metroNIDAZOLE (FLAGYL) IVPB 500 mg     500 mg 100 mL/hr over 60 Minutes Intravenous Every 6 hours 04/28/14 2049     04/28/14 2045  piperacillin-tazobactam (ZOSYN) IVPB 3.375 g     3.375 g 100 mL/hr over 30 Minutes Intravenous  Once 04/28/14 2043        Assessment  Kristen Castillo  74 y.o. 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  Moderate lower abdominal peritonitis but no shock  Plan:  Admit  NPO  IV antibiotics.  Zosyn with perforated & significant stranding  Serial exams/labs  Improve pain control.  Reasonable to do 48 hours of Toradol.  Use ice and heat.  Low threshold to order CT scan repeat in 5-7 days to make sure no abscess formation.  Surgical exploration if clinically declines.  She seemed to be leaning toward surgery.  I noted I would not rush to this as long as she improves.  Given her peritonitis and inflammation, I am concerned she may fail nonoperative management.  However there is no evidence of any decline, so to hold off.  At some point, she would benefit from colonoscopy to rule out worsening stricture (mild one seen in 2003) or neoplasm.  She is overdue.  Hopefully they can find an alternative slower bowel prep that she can better  tolerate.  Would wait at least 6 weeks from this attack if she gets to that.  Some people benefit from elective resection of the sigmoid colon with the diverticulosis to prevent prior attacks.  Usually reserved that after at least 4 mild attacks or one complicated attack.   Would like to  help treat this acute course and then regroup.  If she develops an abscess (very likely) requiring drains or has evidence of worsening stricture on colonoscopy, she could benefit from elective resection.  If she rapidly improves and has no evidence of abnormalities on colonoscopy, there is no hard indication for elective colectomy at this time.    The patient is stable.  There is no evidence of worsening peritonitis, acute abdomen, nor shock.  There is no strong evidence of failure of improvement nor decline with current non-operative management.  There is no need for surgery at the present moment.   We will continue to follow.  She does have some anxiety. .Not severe.  Could offer some mild anxiolysis but would not rush.  Defer to medicine primary service.   VTE prophylaxis- SCDs, etc  Mobilize as tolerated to help recovery    Adin Hector, M.D., F.A.C.S. Gastrointestinal and Minimally Invasive Surgery [facility] [address] [place], [place] [address] [phone] Main / Paging   04/28/2014  Note: Portions of this report may have been transcribed using voice recognition software. Every effort was made to ensure accuracy; however, inadvertent computerized transcription errors may be present.   Any transcriptional errors that result from this process are unintentional.

## 2014-04-28 NOTE — ED Notes (Signed)
Unsuccessful IV attempt by this RN. Wendall Mola RN at bedside attempting IV insertion at present time.

## 2014-04-28 NOTE — ED Notes (Signed)
Report attempted 

## 2014-04-28 NOTE — H&P (Signed)
[facility] History and Physical  Kristen Castillo DTO:[id number] DOB: 12/20/1940 DOA: 04/28/2014  Referring physician: ER physician. PCP: Walker Kehr, MD   Chief Complaint: Abdominal pain.  HPI: Kristen Castillo is a 74 y.o. female with no significant past medical history started experiencing abdominal pain over the last 2 days. Patient has been having dull pain in the left lower quadrant over the last 2 days which worsened acutely this afternoon. In the ER patient had CT abdomen and pelvis which showed acute sigmoid diverticulitis with perforation. On-call surgeon Dr. Johney Maine was consulted and at this time patient admitted for further management. On exam patient has mild tenderness of left lower quadrant. Patient's last bowel movement was this afternoon. Denies vomiting does have some nausea. Denies any chest pain or shortness of breath or any fever or chills. Patient states she has had colonoscopy 7 years ago by Dr. Henrene Pastor.   Review of Systems: As presented in the history of presenting illness, rest negative.  Past Medical History  Diagnosis Date  . Hematuria   . Tremor   . Hair loss   . Unspecified vitamin D deficiency   . Migraines   . Osteoporosis   . Osteoarthritis   . Allergic rhinitis   . Glaucoma    Past Surgical History  Procedure Laterality Date  . Abdominal hysterectomy  1972  . Oophorectomy  1976  . Tonsillectomy    . Lipoma resection      left scapula area  . Reduction mammaplasty  1994  . Rhinoplasty  1985  . Facial cosmetic surgery  2010    [name] MD in [facility]   Social History:  reports that she has never smoked. She has never used smokeless tobacco. She reports that she does not drink alcohol or use illicit drugs. Where does patient live home. Can patient participate in ADLs? Yes.  Allergies  Allergen Reactions  . Codeine Nausea And Vomiting  . Sulfonamide Derivatives Other (See Comments)    Childhood allergy; reaction unknown    Family  History:  Family History  Problem Relation Age of Onset  . Arthritis Mother   . Colon cancer Mother 30  . Heart disease Father   . Dementia Father   . Diabetes Sister   . Cancer Maternal Grandfather     Lung cancer  . Stomach cancer Neg Hx       Prior to Admission medications   Medication Sig Start Date End Date Taking? Authorizing Provider  ergocalciferol (VITAMIN D2) 50000 UNITS capsule Take 1 capsule (50,000 Units total) by mouth once a week. Patient taking differently: Take 50,000 Units by mouth once a week. Thursday. 03/21/14  Yes Aleksei Plotnikov V, MD  naproxen sodium (ANAPROX) 220 MG tablet Take 220 mg by mouth once.   Yes Historical Provider, MD  oxymetazoline (AFRIN) 0.05 % nasal spray Place 1 spray into both nostrils daily as needed for congestion.   Yes Historical Provider, MD  Cholecalciferol (VITAMIN D3) 2000 UNITS capsule Take 1 capsule (2,000 Units total) by mouth daily. Patient not taking: Reported on 04/28/2014 03/21/14   Cassandria Anger, MD    Physical Exam: Filed Vitals:   04/28/14 1941 04/28/14 2200 04/28/14 2228 04/28/14 2247  BP:  109/94 124/49 127/48  Pulse:  83 90 83  Temp:   98.1 F (36.7 C) 98.8 F (37.1 C)  TempSrc:   Oral Oral  Resp:  18 18 20   SpO2: 100% 94% 96% 97%     General: Moderately built  and nourished.  Eyes: Anicteric no pallor.  ENT: No discharge from ears eyes nose and mouth.  Neck: No mass felt.  Cardiovascular: S1 and S2 heard.  Respiratory: No rhonchi or crepitations.  Abdomen: Mild tenderness in the left lower quadrant no guarding or rigidity. Bowel sounds present.  Skin: No rash.  Musculoskeletal: No edema.  Psychiatric: Appears normal.  Neurologic: Alert awake oriented to time place and person. Moves all extremities.  Labs on Admission:  Basic Metabolic Panel:  Recent Labs Lab 04/28/14 1745  NA 136  K 4.2  CL 106  CO2 25  GLUCOSE 141*  BUN 39*  CREATININE 0.86  CALCIUM 8.6   Liver Function  Tests:  Recent Labs Lab 04/28/14 1745  AST 20  ALT 15  ALKPHOS 68  BILITOT 0.7  PROT 6.6  ALBUMIN 3.4*    Recent Labs Lab 04/28/14 1745  LIPASE 26   No results for input(s): AMMONIA in the last 168 hours. CBC:  Recent Labs Lab 04/28/14 1745  WBC 5.5  NEUTROABS 4.5  HGB 14.4  HCT 43.6  MCV 91.8  PLT 255   Cardiac Enzymes: No results for input(s): CKTOTAL, CKMB, CKMBINDEX, TROPONINI in the last 168 hours.  BNP (last 3 results) No results for input(s): BNP in the last 8760 hours.  ProBNP (last 3 results) No results for input(s): PROBNP in the last 8760 hours.  CBG: No results for input(s): GLUCAP in the last 168 hours.  Radiological Exams on Admission: Dg Chest 2 View  04/28/2014   CLINICAL DATA:  Severe abdominal pain beginning at 1400 hr today.  EXAM: CHEST  2 VIEW  COMPARISON:  PA and lateral chest 03/21/2014 and 12/25/2012.  FINDINGS: Mild elevation of the right hemidiaphragm relative to the left is unchanged. Lungs are clear. Heart size is normal. No pneumothorax or pleural effusion.  IMPRESSION: No acute disease.   Electronically Signed   By: Inge Rise M.D.   On: 04/28/2014 19:12   Ct Abdomen Pelvis W Contrast  04/28/2014   CLINICAL DATA:  Low abd pain onset 1400 today, n/v, h/o abdominal hysterectomy, oophorectomy, GERD  EXAM: CT ABDOMEN AND PELVIS WITH CONTRAST  TECHNIQUE: Multidetector CT imaging of the abdomen and pelvis was performed using the standard protocol following bolus administration of intravenous contrast.  CONTRAST:  143mL OMNIPAQUE IOHEXOL 300 MG/ML  SOLN  COMPARISON:  07/02/2010  FINDINGS: There is wall thickening along the mid to lower sigmoid colon where there are multiple diverticula. There is adjacent significant inflammatory stranding. Small bubbles of extraluminal air lie anterior to the mid sigmoid colon with several others noted along the anterior peritoneal cavity extending all the way superior to lie anterior to the liver. There is  no formed fluid collection to suggest an abscess. Findings are most consistent with perforated diverticulitis.  There is dependent subsegmental atelectasis in the lower lobes. No convincing pneumonia or edema.  Low-density un enhancing liver lesions are noted consistent with cysts. There are 3 low-density splenic lesions which may also reflect cysts. There also splenic calcifications consistent with healed granuloma. Spleen is normal in size.  Gallbladder pancreas are unremarkable. No bile duct dilation. No adrenal masses.  Multiple bilateral renal cortical and renal sinus cysts. No hydronephrosis. Ureters normal in course and in caliber. Bladder is unremarkable.  Uterus surgically absent.  No pelvic masses.  There are several prominent mesenteric lymph nodes largest measuring 7 mm in short axis.  Small amount of ascites most of which collects in the posterior pelvic  recess.  Chronic bilateral pars defects at L5-S1 with a grade 1 anterolisthesis. There are degenerative changes most evident in the lower lumbar spine. No osteoblastic or osteolytic lesions.  IMPRESSION: 1. Acute sigmoid colon diverticulitis. There are findings of perforation of a diverticulum with adjacent extraluminal air as well as a small amount of free air. There is no evidence of an abscess. 2. No other acute findings.   Electronically Signed   By: Lajean Manes M.D.   On: 04/28/2014 19:51     Assessment/Plan Principal Problem:   Diverticulitis of colon with perforation Active Problems:   Diverticulitis   1. Acute sigmoid diverticulitis with perforation - on-call surgeon Dr. Johney Maine was notified and surgery will be seeing patient in consult. At this time I have placed patient nothing by mouth with empiric antibiotics. We will get KUB in a.m. Continue with gentle hydration and antiemetic medications. 2. Dehydration - patient is BUN is mildly elevated probably from dehydration. Gently hydrate and closely follow metabolic panel.   DVT  Prophylaxis SCDs for now.  Code Status: Full code.  Family Communication: None.  Disposition Plan: Admit to inpatient.    Domnique Vantine N. [facility] Pager [phone].  If 7PM-7AM, please contact night-coverage [email or web address] Password [facility] 04/28/2014, 10:47 PM

## 2014-04-28 NOTE — ED Provider Notes (Signed)
CSN: [id number]     Arrival date & time 04/28/14  1642 History   First MD Initiated Contact with Patient 04/28/14 1710     Chief Complaint  Patient presents with  . Abdominal Pain     (Consider location/radiation/quality/duration/timing/severity/associated sxs/prior Treatment) Patient is a 74 y.o. female presenting with abdominal pain.  Abdominal Pain Pain location:  Periumbilical and LLQ Pain quality: dull   Pain radiates to:  Does not radiate Pain severity:  Moderate Onset quality:  Sudden Duration:  4 hours Timing:  Constant Progression:  Improving Chronicity:  New Context comment:  At rest Relieved by:  Nothing Worsened by:  Nothing tried Ineffective treatments:  None tried Associated symptoms: nausea   Associated symptoms: no chest pain, no cough, no diarrhea, no dysuria, no fatigue, no fever, no hematuria, no shortness of breath and no vomiting     Past Medical History  Diagnosis Date  . Hematuria   . Tremor   . Hair loss   . Unspecified vitamin D deficiency   . Migraines   . Osteoporosis   . Osteoarthritis   . Allergic rhinitis   . Glaucoma    Past Surgical History  Procedure Laterality Date  . Abdominal hysterectomy      1972  . Oophorectomy      1976  . Tonsillectomy    . Excision of lipoma      left scapula area  . Reduction mammoplasty      1994  . Rhinoplasty      1985  . Facial cosmetic surgery      '10 [facility] Astin MD in [place])   Family History  Problem Relation Age of Onset  . Arthritis Mother   . Colon cancer Mother 73  . Heart disease Father   . Dementia Father   . Diabetes Sister   . Cancer Maternal Grandfather     Lung cancer  . Stomach cancer Neg Hx    History  Substance Use Topics  . Smoking status: Never Smoker   . Smokeless tobacco: Never Used  . Alcohol Use: No   OB History    No data available     Review of Systems  Constitutional: Negative for fever and fatigue.  HENT: Negative for congestion and drooling.    Eyes: Negative for pain.  Respiratory: Negative for cough and shortness of breath.   Cardiovascular: Negative for chest pain.  Gastrointestinal: Positive for nausea and abdominal pain. Negative for vomiting and diarrhea.  Genitourinary: Negative for dysuria and hematuria.  Musculoskeletal: Negative for back pain, gait problem and neck pain.  Skin: Negative for color change.  Neurological: Negative for dizziness and headaches.  Hematological: Negative for adenopathy.  Psychiatric/Behavioral: Negative for behavioral problems.  All other systems reviewed and are negative.     Allergies  Codeine and Sulfonamide derivatives  Home Medications   Prior to Admission medications   Medication Sig Start Date End Date Taking? Authorizing Provider  ergocalciferol (VITAMIN D2) 50000 UNITS capsule Take 1 capsule (50,000 Units total) by mouth once a week. Patient taking differently: Take 50,000 Units by mouth once a week. Thursday. 03/21/14  Yes Aleksei Plotnikov V, MD  naproxen sodium (ANAPROX) 220 MG tablet Take 220 mg by mouth once.   Yes Historical Provider, MD  oxymetazoline (AFRIN) 0.05 % nasal spray Place 1 spray into both nostrils daily as needed for congestion.   Yes Historical Provider, MD  Cholecalciferol (VITAMIN D3) 2000 UNITS capsule Take 1 capsule (2,000 Units total) by mouth  daily. Patient not taking: Reported on 04/28/2014 03/21/14   Aleksei Plotnikov V, MD   BP 121/55 mmHg  Pulse 71  Temp(Src) 98.1 F (36.7 C) (Rectal)  Resp 16  SpO2 98% Physical Exam  Constitutional: She is oriented to person, place, and time. She appears well-developed and well-nourished.  HENT:  Head: Normocephalic.  Mouth/Throat: Oropharynx is clear and moist. No oropharyngeal exudate.  Eyes: Conjunctivae and EOM are normal. Pupils are equal, round, and reactive to light.  Neck: Normal range of motion. Neck supple.  Cardiovascular: Normal rate, regular rhythm, normal heart sounds and intact distal  pulses.  Exam reveals no gallop and no friction rub.   No murmur heard. Pulmonary/Chest: Effort normal and breath sounds normal. No respiratory distress. She has no wheezes.  Abdominal: Soft. Bowel sounds are normal. There is tenderness (mild tenderness to palpation of the left lower quadrant and infraumbilical area.). There is no rebound and no guarding.  Musculoskeletal: Normal range of motion. She exhibits no edema or tenderness.  Neurological: She is alert and oriented to person, place, and time.  Skin: Skin is warm and dry.  Psychiatric: She has a normal mood and affect. Her behavior is normal.  Nursing note and vitals reviewed.   ED Course  Procedures (including critical care time) Labs Review Labs Reviewed  CBC WITH DIFFERENTIAL/PLATELET - Abnormal; Notable for the following:    Neutrophils Relative % 82 (*)    All other components within normal limits  COMPREHENSIVE METABOLIC PANEL - Abnormal; Notable for the following:    Glucose, Bld 141 (*)    BUN 39 (*)    Albumin 3.4 (*)    GFR calc non Af Amer 65 (*)    GFR calc Af Amer 76 (*)    All other components within normal limits  URINALYSIS, ROUTINE W REFLEX MICROSCOPIC - Abnormal; Notable for the following:    Specific Gravity, Urine >1.046 (*)    All other components within normal limits  COMPREHENSIVE METABOLIC PANEL - Abnormal; Notable for the following:    Glucose, Bld 143 (*)    BUN 29 (*)    Calcium 7.9 (*)    Total Protein 5.8 (*)    Albumin 2.9 (*)    GFR calc non Af Amer 62 (*)    GFR calc Af Amer 72 (*)    All other components within normal limits  CBC WITH DIFFERENTIAL/PLATELET - Abnormal; Notable for the following:    Neutrophils Relative % 90 (*)    Neutro Abs 8.5 (*)    Lymphocytes Relative 5 (*)    Lymphs Abs 0.5 (*)    All other components within normal limits  GLUCOSE, CAPILLARY - Abnormal; Notable for the following:    Glucose-Capillary 122 (*)    All other components within normal limits   GLUCOSE, CAPILLARY - Abnormal; Notable for the following:    Glucose-Capillary 141 (*)    All other components within normal limits  LIPASE, BLOOD  HEMOGLOBIN A1C  I-STAT TROPOININ, ED    Imaging Review Dg Chest 2 View  04/28/2014   CLINICAL DATA:  Severe abdominal pain beginning at 1400 hr today.  EXAM: CHEST  2 VIEW  COMPARISON:  PA and lateral chest 03/21/2014 and 12/25/2012.  FINDINGS: Mild elevation of the right hemidiaphragm relative to the left is unchanged. Lungs are clear. Heart size is normal. No pneumothorax or pleural effusion.  IMPRESSION: No acute disease.   Electronically Signed   By: Inge Rise M.D.   On:  04/28/2014 19:12   Dg Abd 1 View  04/29/2014   CLINICAL DATA:  Lower abdominal pain, question bowel perforation from diverticulitis.  EXAM: ABDOMEN - 1 VIEW  COMPARISON:  Abdominal CT from yesterday  FINDINGS: The bowel gas pattern is nonobstructive, with a single dilated loop of small bowel in the left abdomen, likely reactive to the intra-abdominal inflammation. There is known pneumoperitoneum from yesterday's study, but no evidence of increase. Left upper quadrant calcifications are granulomatous and splenic. No concerning intra-abdominal mass effect. There is mild bibasilar atelectasis.  IMPRESSION: 1. Limited study for re- evaluating pneumoperitoneum due to supine positioning. 2. Focal, reactive ileus in the left abdomen.   Electronically Signed   By: Monte Fantasia M.D.   On: 04/29/2014 08:25   Ct Abdomen Pelvis W Contrast  04/28/2014   CLINICAL DATA:  Low abd pain onset 1400 today, n/v, h/o abdominal hysterectomy, oophorectomy, GERD  EXAM: CT ABDOMEN AND PELVIS WITH CONTRAST  TECHNIQUE: Multidetector CT imaging of the abdomen and pelvis was performed using the standard protocol following bolus administration of intravenous contrast.  CONTRAST:  156mL OMNIPAQUE IOHEXOL 300 MG/ML  SOLN  COMPARISON:  07/02/2010  FINDINGS: There is wall thickening along the mid to lower  sigmoid colon where there are multiple diverticula. There is adjacent significant inflammatory stranding. Small bubbles of extraluminal air lie anterior to the mid sigmoid colon with several others noted along the anterior peritoneal cavity extending all the way superior to lie anterior to the liver. There is no formed fluid collection to suggest an abscess. Findings are most consistent with perforated diverticulitis.  There is dependent subsegmental atelectasis in the lower lobes. No convincing pneumonia or edema.  Low-density un enhancing liver lesions are noted consistent with cysts. There are 3 low-density splenic lesions which may also reflect cysts. There also splenic calcifications consistent with healed granuloma. Spleen is normal in size.  Gallbladder pancreas are unremarkable. No bile duct dilation. No adrenal masses.  Multiple bilateral renal cortical and renal sinus cysts. No hydronephrosis. Ureters normal in course and in caliber. Bladder is unremarkable.  Uterus surgically absent.  No pelvic masses.  There are several prominent mesenteric lymph nodes largest measuring 7 mm in short axis.  Small amount of ascites most of which collects in the posterior pelvic recess.  Chronic bilateral pars defects at L5-S1 with a grade 1 anterolisthesis. There are degenerative changes most evident in the lower lumbar spine. No osteoblastic or osteolytic lesions.  IMPRESSION: 1. Acute sigmoid colon diverticulitis. There are findings of perforation of a diverticulum with adjacent extraluminal air as well as a small amount of free air. There is no evidence of an abscess. 2. No other acute findings.   Electronically Signed   By: Lajean Manes M.D.   On: 04/28/2014 19:51     EKG Interpretation   Date/Time:  Thursday April 28 2014 17:01:53 EST Ventricular Rate:  72 PR Interval:  178 QRS Duration: 70 QT Interval:  400 QTC Calculation: 438 R Axis:   30 Text Interpretation:  Sinus rhythm Left atrial enlargement  Low voltage,  precordial leads No significant change since last tracing Confirmed by  Tillie Viverette  MD, Zooey Schreurs ([id number]) on 04/28/2014 5:14:25 PM     CRITICAL CARE Performed by: Pamella Pert, S Total critical care time: 30 min Critical care time was exclusive of separately billable procedures and treating other patients. Critical care was necessary to treat or prevent imminent or life-threatening deterioration. Critical care was time spent personally by me on the  following activities: development of treatment plan with patient and/or surrogate as well as nursing, discussions with consultants, evaluation of patient's response to treatment, examination of patient, obtaining history from patient or surrogate, ordering and performing treatments and interventions, ordering and review of laboratory studies, ordering and review of radiographic studies, pulse oximetry and re-evaluation of patient's condition.  MDM   Final diagnoses:  Diverticulitis of colon with perforation    5:46 PM 74 y.o. female who presents with left-sided abdominal pain. She notes that she felt like the pain was coming on over the last 2 days and then began abruptly around 2 PM today. She has had some nausea and retching but no vomiting or diarrhea. She denies any fevers. She states that she has a burning sensation in her chest today but this has resolved. Her vital signs are unremarkable here. We'll get screening labs and imaging. Pain control with fentanyl. Pt does have hx of kidney stones.   Pt found to have diverticulitis w/ perforation. Started on Zosyn. Case discussed w/ [facility]. Will admit to hospitalist.   Pamella Pert, MD 04/29/14 [phone]

## 2014-04-28 NOTE — ED Notes (Signed)
MD at bedside. 

## 2014-04-29 ENCOUNTER — Inpatient Hospital Stay (HOSPITAL_COMMUNITY): Payer: Medicare Other

## 2014-04-29 DIAGNOSIS — F419 Anxiety disorder, unspecified: Secondary | ICD-10-CM

## 2014-04-29 DIAGNOSIS — K56699 Other intestinal obstruction unspecified as to partial versus complete obstruction: Secondary | ICD-10-CM | POA: Diagnosis present

## 2014-04-29 DIAGNOSIS — Z5320 Procedure and treatment not carried out because of patient's decision for unspecified reasons: Secondary | ICD-10-CM

## 2014-04-29 DIAGNOSIS — Z8669 Personal history of other diseases of the nervous system and sense organs: Secondary | ICD-10-CM

## 2014-04-29 LAB — COMPREHENSIVE METABOLIC PANEL
ALT: 12 U/L (ref 0–35)
ANION GAP: 7 (ref 5–15)
AST: 21 U/L (ref 0–37)
Albumin: 2.9 g/dL — ABNORMAL LOW (ref 3.5–5.2)
Alkaline Phosphatase: 55 U/L (ref 39–117)
BUN: 29 mg/dL — AB (ref 6–23)
CALCIUM: 7.9 mg/dL — AB (ref 8.4–10.5)
CO2: 23 mmol/L (ref 19–32)
Chloride: 109 mmol/L (ref 96–112)
Creatinine, Ser: 0.9 mg/dL (ref 0.50–1.10)
GFR calc Af Amer: 72 mL/min — ABNORMAL LOW (ref >90)
GFR, EST NON AFRICAN AMERICAN: 62 mL/min — AB (ref >90)
Glucose, Bld: 143 mg/dL — ABNORMAL HIGH (ref 70–99)
Potassium: 4.2 mmol/L (ref 3.5–5.1)
Sodium: 139 mmol/L (ref 135–145)
Total Bilirubin: 1.1 mg/dL (ref 0.3–1.2)
Total Protein: 5.8 g/dL — ABNORMAL LOW (ref 6.0–8.3)

## 2014-04-29 LAB — GLUCOSE, CAPILLARY
GLUCOSE-CAPILLARY: 122 mg/dL — AB (ref 70–99)
Glucose-Capillary: 130 mg/dL — ABNORMAL HIGH (ref 70–99)
Glucose-Capillary: 141 mg/dL — ABNORMAL HIGH (ref 70–99)
Glucose-Capillary: 146 mg/dL — ABNORMAL HIGH (ref 70–99)
Glucose-Capillary: 154 mg/dL — ABNORMAL HIGH (ref 70–99)

## 2014-04-29 LAB — CBC WITH DIFFERENTIAL/PLATELET
Basophils Absolute: 0 10*3/uL (ref 0.0–0.1)
Basophils Relative: 0 % (ref 0–1)
EOS ABS: 0 10*3/uL (ref 0.0–0.7)
Eosinophils Relative: 0 % (ref 0–5)
HCT: 39.2 % (ref 36.0–46.0)
HEMOGLOBIN: 12.9 g/dL (ref 12.0–15.0)
LYMPHS PCT: 5 % — AB (ref 12–46)
Lymphs Abs: 0.5 10*3/uL — ABNORMAL LOW (ref 0.7–4.0)
MCH: 30 pg (ref 26.0–34.0)
MCHC: 32.9 g/dL (ref 30.0–36.0)
MCV: 91.2 fL (ref 78.0–100.0)
MONOS PCT: 5 % (ref 3–12)
Monocytes Absolute: 0.5 10*3/uL (ref 0.1–1.0)
NEUTROS PCT: 90 % — AB (ref 43–77)
Neutro Abs: 8.5 10*3/uL — ABNORMAL HIGH (ref 1.7–7.7)
PLATELETS: 238 10*3/uL (ref 150–400)
RBC: 4.3 MIL/uL (ref 3.87–5.11)
RDW: 13.8 % (ref 11.5–15.5)
WBC: 9.6 10*3/uL (ref 4.0–10.5)

## 2014-04-29 MED ORDER — KETOROLAC TROMETHAMINE 15 MG/ML IJ SOLN
15.0000 mg | Freq: Four times a day (QID) | INTRAMUSCULAR | Status: AC
  Administered 2014-04-29 – 2014-04-30 (×8): 15 mg via INTRAVENOUS
  Filled 2014-04-29 (×9): qty 1

## 2014-04-29 MED ORDER — MAGIC MOUTHWASH
15.0000 mL | Freq: Four times a day (QID) | ORAL | Status: DC | PRN
  Filled 2014-04-29: qty 15

## 2014-04-29 MED ORDER — LORAZEPAM 2 MG/ML IJ SOLN
0.5000 mg | Freq: Three times a day (TID) | INTRAMUSCULAR | Status: DC | PRN
  Administered 2014-05-01 – 2014-05-02 (×2): 0.5 mg via INTRAVENOUS
  Filled 2014-04-29 (×2): qty 1

## 2014-04-29 MED ORDER — KCL IN DEXTROSE-NACL 20-5-0.45 MEQ/L-%-% IV SOLN
INTRAVENOUS | Status: DC
  Administered 2014-04-29 – 2014-05-03 (×8): via INTRAVENOUS
  Filled 2014-04-29 (×11): qty 1000

## 2014-04-29 MED ORDER — PROMETHAZINE HCL 25 MG/ML IJ SOLN
6.2500 mg | INTRAMUSCULAR | Status: DC | PRN
  Administered 2014-05-02: 12.5 mg via INTRAVENOUS
  Filled 2014-04-29: qty 1

## 2014-04-29 MED ORDER — DIPHENHYDRAMINE HCL 50 MG/ML IJ SOLN: 12.5000 mg | Freq: Four times a day (QID) | INTRAMUSCULAR | Status: DC | PRN

## 2014-04-29 MED ORDER — PHENOL 1.4 % MT LIQD: 2.0000 | OROMUCOSAL | Status: DC | PRN

## 2014-04-29 MED ORDER — LACTATED RINGERS IV BOLUS (SEPSIS)
1000.0000 mL | Freq: Once | INTRAVENOUS | Status: AC
  Administered 2014-04-29: 1000 mL via INTRAVENOUS

## 2014-04-29 MED ORDER — MENTHOL 3 MG MT LOZG
1.0000 | LOZENGE | OROMUCOSAL | Status: DC | PRN
  Filled 2014-04-29: qty 9

## 2014-04-29 MED ORDER — ALUM & MAG HYDROXIDE-SIMETH 200-200-20 MG/5ML PO SUSP: 30.0000 mL | Freq: Four times a day (QID) | ORAL | Status: DC | PRN

## 2014-04-29 MED ORDER — LIP MEDEX EX OINT
1.0000 "application " | TOPICAL_OINTMENT | Freq: Two times a day (BID) | CUTANEOUS | Status: DC
  Administered 2014-04-29 – 2014-05-03 (×9): 1 via TOPICAL
  Filled 2014-04-29: qty 7

## 2014-04-29 MED ORDER — LACTATED RINGERS IV BOLUS (SEPSIS): 1000.0000 mL | Freq: Three times a day (TID) | INTRAVENOUS | Status: AC | PRN

## 2014-04-29 MED ORDER — SACCHAROMYCES BOULARDII 250 MG PO CAPS
250.0000 mg | ORAL_CAPSULE | Freq: Two times a day (BID) | ORAL | Status: DC
  Filled 2014-04-29 (×10): qty 1

## 2014-04-29 MED ORDER — METOCLOPRAMIDE HCL 5 MG/ML IJ SOLN: 5.0000 mg | Freq: Four times a day (QID) | INTRAMUSCULAR | Status: DC | PRN

## 2014-04-29 MED ORDER — OXYMETAZOLINE HCL 0.05 % NA SOLN
1.0000 | Freq: Every day | NASAL | Status: DC | PRN
  Administered 2014-04-29: 1 via NASAL
  Filled 2014-04-29: qty 15

## 2014-04-29 NOTE — Progress Notes (Signed)
Subjective: Pt is doing well feels much better than yesterday.  Still a bit distended and tender.    Objective: Vital signs in last 24 hours: Temp:  [98.1 F (36.7 C)-100 F (37.8 C)] 100 F (37.8 C) (02/19 0518) Pulse Rate:  [71-94] 85 (02/19 0826) Resp:  [16-20] 20 (02/19 0518) BP: (107-127)/(33-94) 107/41 mmHg (02/19 0826) SpO2:  [94 %-100 %] 96 % (02/19 0518) Last BM Date: 04/28/14 Tm 100 Labs stable this AM CT scan yesterday PM: Acute sigmoid colon diverticulitis. There are findings of perforation of a diverticulum with adjacent extraluminal air as well as a small amount of free air. There is no evidence of an abscess Intake/Output from previous day: 02/18 0701 - 02/19 0700 In: 545 [I.V.:495; IV Piggyback:50] Out: -  Intake/Output this shift:    General appearance: alert, cooperative and no distress GI: soft, sore, not as tender as she was yesterday.  Tenderness is across ther abdomen. Yesterday during the attack it was more left colon pain.  Lab Results:   Recent Labs  04/28/14 1745 04/29/14 0605  WBC 5.5 9.6  HGB 14.4 12.9  HCT 43.6 39.2  PLT 255 238    BMET  Recent Labs  04/28/14 1745 04/29/14 0605  NA 136 139  K 4.2 4.2  CL 106 109  CO2 25 23  GLUCOSE 141* 143*  BUN 39* 29*  CREATININE 0.86 0.90  CALCIUM 8.6 7.9*   PT/INR No results for input(s): LABPROT, INR in the last 72 hours.   Recent Labs Lab 04/28/14 1745 04/29/14 0605  AST 20 21  ALT 15 12  ALKPHOS 68 55  BILITOT 0.7 1.1  PROT 6.6 5.8*  ALBUMIN 3.4* 2.9*     Lipase     Component Value Date/Time   LIPASE 26 04/28/2014 1745     Studies/Results: Dg Chest 2 View  04/28/2014   CLINICAL DATA:  Severe abdominal pain beginning at 1400 hr today.  EXAM: CHEST  2 VIEW  COMPARISON:  PA and lateral chest 03/21/2014 and 12/25/2012.  FINDINGS: Mild elevation of the right hemidiaphragm relative to the left is unchanged. Lungs are clear. Heart size is normal. No pneumothorax or  pleural effusion.  IMPRESSION: No acute disease.   Electronically Signed   By: Inge Rise M.D.   On: 04/28/2014 19:12   Dg Abd 1 View  04/29/2014   CLINICAL DATA:  Lower abdominal pain, question bowel perforation from diverticulitis.  EXAM: ABDOMEN - 1 VIEW  COMPARISON:  Abdominal CT from yesterday  FINDINGS: The bowel gas pattern is nonobstructive, with a single dilated loop of small bowel in the left abdomen, likely reactive to the intra-abdominal inflammation. There is known pneumoperitoneum from yesterday's study, but no evidence of increase. Left upper quadrant calcifications are granulomatous and splenic. No concerning intra-abdominal mass effect. There is mild bibasilar atelectasis.  IMPRESSION: 1. Limited study for re- evaluating pneumoperitoneum due to supine positioning. 2. Focal, reactive ileus in the left abdomen.   Electronically Signed   By: Monte Fantasia M.D.   On: 04/29/2014 08:25   Ct Abdomen Pelvis W Contrast  04/28/2014   CLINICAL DATA:  Low abd pain onset 1400 today, n/v, h/o abdominal hysterectomy, oophorectomy, GERD  EXAM: CT ABDOMEN AND PELVIS WITH CONTRAST  TECHNIQUE: Multidetector CT imaging of the abdomen and pelvis was performed using the standard protocol following bolus administration of intravenous contrast.  CONTRAST:  132mL OMNIPAQUE IOHEXOL 300 MG/ML  SOLN  COMPARISON:  07/02/2010  FINDINGS: There is wall thickening  along the mid to lower sigmoid colon where there are multiple diverticula. There is adjacent significant inflammatory stranding. Small bubbles of extraluminal air lie anterior to the mid sigmoid colon with several others noted along the anterior peritoneal cavity extending all the way superior to lie anterior to the liver. There is no formed fluid collection to suggest an abscess. Findings are most consistent with perforated diverticulitis.  There is dependent subsegmental atelectasis in the lower lobes. No convincing pneumonia or edema.  Low-density un  enhancing liver lesions are noted consistent with cysts. There are 3 low-density splenic lesions which may also reflect cysts. There also splenic calcifications consistent with healed granuloma. Spleen is normal in size.  Gallbladder pancreas are unremarkable. No bile duct dilation. No adrenal masses.  Multiple bilateral renal cortical and renal sinus cysts. No hydronephrosis. Ureters normal in course and in caliber. Bladder is unremarkable.  Uterus surgically absent.  No pelvic masses.  There are several prominent mesenteric lymph nodes largest measuring 7 mm in short axis.  Small amount of ascites most of which collects in the posterior pelvic recess.  Chronic bilateral pars defects at L5-S1 with a grade 1 anterolisthesis. There are degenerative changes most evident in the lower lumbar spine. No osteoblastic or osteolytic lesions.  IMPRESSION: 1. Acute sigmoid colon diverticulitis. There are findings of perforation of a diverticulum with adjacent extraluminal air as well as a small amount of free air. There is no evidence of an abscess. 2. No other acute findings.   Electronically Signed   By: Lajean Manes M.D.   On: 04/28/2014 19:51    Medications: . ketorolac  15 mg Intravenous 4 times per day  . lip balm  1 application Topical BID  . piperacillin-tazobactam (ZOSYN)  IV  3.375 g Intravenous 3 times per day  . saccharomyces boulardii  250 mg Oral BID    Assessment/Plan Diverticulitis of colon with perforation Hx of diverticulosis Hx of migraines Osteoarthritis Osteoporosis Glaucoma SCD for DVT prophylaxis   Plan:  Continue bowel rest and antibiotics.  Ok for her to continue ice chips.    LOS: 1 day    JENNINGS,WILLARD 04/29/2014  Agree with above. She is better.  Her husband was in the room.  I spent about 15 minutes going over the treatment of diverticulitis - but for now medical treatment is appropriate. Will increase IVF.  Alphonsa Overall, MD, [facility] Pager:  [phone] Office phone:  [phone]

## 2014-04-29 NOTE — Progress Notes (Signed)
UR complete 

## 2014-04-29 NOTE — Progress Notes (Signed)
[facility] PROGRESS NOTE  Kristen Castillo GYK:[id number] DOB: 11-05-1940 DOA: 04/28/2014 PCP: Walker Kehr, MD  Assessment/Plan: 1. Acute sigmoid diverticulitis with perforation 1. Improving today 2. Surgery following 3. Plans for continued ice chips 4. Pt is continued on zosyn and probiotic 5. Continue supportive care 2. Dehydration 1. Cont hydration as tolerated 3. Migraines 1. Stable 2. None currently 4. Osteoarthritis 1. Stable 2. No active joint pains at present 5. DVT prophylaxis 1. SCD's  Code Status: Full Family Communication: Pt in room (indicate person spoken with, relationship, and if by phone, the number) Disposition Plan: Pending   Consultants:  Surgery  Procedures:    Antibiotics:  Zosyn 2/18>>> (indicate start date, and stop date if known)  HPI/Subjective: No acute events noted  Objective: Filed Vitals:   04/28/14 2228 04/28/14 2247 04/29/14 0518 04/29/14 0826  BP: 124/49 127/48 120/33 107/41  Pulse: 90 83 94 85  Temp: 98.1 F (36.7 C) 98.8 F (37.1 C) 100 F (37.8 C)   TempSrc: Oral Oral Oral   Resp: 18 20 20    SpO2: 96% 97% 96%     Intake/Output Summary (Last 24 hours) at 04/29/14 1507 Last data filed at 04/29/14 0600  Gross per 24 hour  Intake    545 ml  Output      0 ml  Net    545 ml   There were no vitals filed for this visit.  Exam:   General:  Awake, in nad  Cardiovascular: regular, s1, s2  Respiratory: normal resp effort, no wheezing  Abdomen: soft,nondistended  Musculoskeletal: perfused,no clubbing   Data Reviewed: Basic Metabolic Panel:  Recent Labs Lab 04/28/14 1745 04/29/14 0605  NA 136 139  K 4.2 4.2  CL 106 109  CO2 25 23  GLUCOSE 141* 143*  BUN 39* 29*  CREATININE 0.86 0.90  CALCIUM 8.6 7.9*   Liver Function Tests:  Recent Labs Lab 04/28/14 1745 04/29/14 0605  AST 20 21  ALT 15 12  ALKPHOS 68 55  BILITOT 0.7 1.1  PROT 6.6 5.8*  ALBUMIN 3.4* 2.9*    Recent Labs Lab  04/28/14 1745  LIPASE 26   No results for input(s): AMMONIA in the last 168 hours. CBC:  Recent Labs Lab 04/28/14 1745 04/29/14 0605  WBC 5.5 9.6  NEUTROABS 4.5 8.5*  HGB 14.4 12.9  HCT 43.6 39.2  MCV 91.8 91.2  PLT 255 238   Cardiac Enzymes: No results for input(s): CKTOTAL, CKMB, CKMBINDEX, TROPONINI in the last 168 hours. BNP (last 3 results) No results for input(s): BNP in the last 8760 hours.  ProBNP (last 3 results) No results for input(s): PROBNP in the last 8760 hours.  CBG:  Recent Labs Lab 04/29/14 0007 04/29/14 0526 04/29/14 1142  GLUCAP 122* 141* 130*    No results found for this or any previous visit (from the past 240 hour(s)).   Studies: Dg Chest 2 View  04/28/2014   CLINICAL DATA:  Severe abdominal pain beginning at 1400 hr today.  EXAM: CHEST  2 VIEW  COMPARISON:  PA and lateral chest 03/21/2014 and 12/25/2012.  FINDINGS: Mild elevation of the right hemidiaphragm relative to the left is unchanged. Lungs are clear. Heart size is normal. No pneumothorax or pleural effusion.  IMPRESSION: No acute disease.   Electronically Signed   By: Inge Rise M.D.   On: 04/28/2014 19:12   Dg Abd 1 View  04/29/2014   CLINICAL DATA:  Lower abdominal pain, question bowel perforation from diverticulitis.  EXAM: ABDOMEN - 1 VIEW  COMPARISON:  Abdominal CT from yesterday  FINDINGS: The bowel gas pattern is nonobstructive, with a single dilated loop of small bowel in the left abdomen, likely reactive to the intra-abdominal inflammation. There is known pneumoperitoneum from yesterday's study, but no evidence of increase. Left upper quadrant calcifications are granulomatous and splenic. No concerning intra-abdominal mass effect. There is mild bibasilar atelectasis.  IMPRESSION: 1. Limited study for re- evaluating pneumoperitoneum due to supine positioning. 2. Focal, reactive ileus in the left abdomen.   Electronically Signed   By: Monte Fantasia M.D.   On: 04/29/2014 08:25    Ct Abdomen Pelvis W Contrast  04/28/2014   CLINICAL DATA:  Low abd pain onset 1400 today, n/v, h/o abdominal hysterectomy, oophorectomy, GERD  EXAM: CT ABDOMEN AND PELVIS WITH CONTRAST  TECHNIQUE: Multidetector CT imaging of the abdomen and pelvis was performed using the standard protocol following bolus administration of intravenous contrast.  CONTRAST:  144mL OMNIPAQUE IOHEXOL 300 MG/ML  SOLN  COMPARISON:  07/02/2010  FINDINGS: There is wall thickening along the mid to lower sigmoid colon where there are multiple diverticula. There is adjacent significant inflammatory stranding. Small bubbles of extraluminal air lie anterior to the mid sigmoid colon with several others noted along the anterior peritoneal cavity extending all the way superior to lie anterior to the liver. There is no formed fluid collection to suggest an abscess. Findings are most consistent with perforated diverticulitis.  There is dependent subsegmental atelectasis in the lower lobes. No convincing pneumonia or edema.  Low-density un enhancing liver lesions are noted consistent with cysts. There are 3 low-density splenic lesions which may also reflect cysts. There also splenic calcifications consistent with healed granuloma. Spleen is normal in size.  Gallbladder pancreas are unremarkable. No bile duct dilation. No adrenal masses.  Multiple bilateral renal cortical and renal sinus cysts. No hydronephrosis. Ureters normal in course and in caliber. Bladder is unremarkable.  Uterus surgically absent.  No pelvic masses.  There are several prominent mesenteric lymph nodes largest measuring 7 mm in short axis.  Small amount of ascites most of which collects in the posterior pelvic recess.  Chronic bilateral pars defects at L5-S1 with a grade 1 anterolisthesis. There are degenerative changes most evident in the lower lumbar spine. No osteoblastic or osteolytic lesions.  IMPRESSION: 1. Acute sigmoid colon diverticulitis. There are findings of  perforation of a diverticulum with adjacent extraluminal air as well as a small amount of free air. There is no evidence of an abscess. 2. No other acute findings.   Electronically Signed   By: Lajean Manes M.D.   On: 04/28/2014 19:51    Scheduled Meds: . ketorolac  15 mg Intravenous 4 times per day  . lip balm  1 application Topical BID  . piperacillin-tazobactam (ZOSYN)  IV  3.375 g Intravenous 3 times per day  . saccharomyces boulardii  250 mg Oral BID   Continuous Infusions: . 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 If 7PM-7AM, please contact night-coverage at [email or web address], password [facility] 04/29/2014, 3:07 PM  LOS: 1 day

## 2014-04-30 ENCOUNTER — Inpatient Hospital Stay (HOSPITAL_COMMUNITY): Payer: Medicare Other

## 2014-04-30 DIAGNOSIS — E860 Dehydration: Secondary | ICD-10-CM

## 2014-04-30 LAB — CBC WITH DIFFERENTIAL/PLATELET
BASOS ABS: 0 10*3/uL (ref 0.0–0.1)
BASOS PCT: 0 % (ref 0–1)
EOS ABS: 0 10*3/uL (ref 0.0–0.7)
EOS PCT: 0 % (ref 0–5)
HEMATOCRIT: 37.6 % (ref 36.0–46.0)
Hemoglobin: 12.3 g/dL (ref 12.0–15.0)
Lymphocytes Relative: 5 % — ABNORMAL LOW (ref 12–46)
Lymphs Abs: 0.8 10*3/uL (ref 0.7–4.0)
MCH: 30.3 pg (ref 26.0–34.0)
MCHC: 32.7 g/dL (ref 30.0–36.0)
MCV: 92.6 fL (ref 78.0–100.0)
MONOS PCT: 2 % — AB (ref 3–12)
Monocytes Absolute: 0.3 10*3/uL (ref 0.1–1.0)
Neutro Abs: 15.1 10*3/uL — ABNORMAL HIGH (ref 1.7–7.7)
Neutrophils Relative %: 93 % — ABNORMAL HIGH (ref 43–77)
Platelets: 220 10*3/uL (ref 150–400)
RBC: 4.06 MIL/uL (ref 3.87–5.11)
RDW: 14.1 % (ref 11.5–15.5)
WBC MORPHOLOGY: INCREASED
WBC: 16.2 10*3/uL — AB (ref 4.0–10.5)

## 2014-04-30 LAB — BASIC METABOLIC PANEL
Anion gap: 7 (ref 5–15)
BUN: 31 mg/dL — AB (ref 6–23)
CALCIUM: 7.9 mg/dL — AB (ref 8.4–10.5)
CO2: 22 mmol/L (ref 19–32)
CREATININE: 1.17 mg/dL — AB (ref 0.50–1.10)
Chloride: 105 mmol/L (ref 96–112)
GFR, EST AFRICAN AMERICAN: 52 mL/min — AB (ref >90)
GFR, EST NON AFRICAN AMERICAN: 45 mL/min — AB (ref >90)
Glucose, Bld: 160 mg/dL — ABNORMAL HIGH (ref 70–99)
Potassium: 4.4 mmol/L (ref 3.5–5.1)
Sodium: 134 mmol/L — ABNORMAL LOW (ref 135–145)

## 2014-04-30 LAB — GLUCOSE, CAPILLARY
GLUCOSE-CAPILLARY: 147 mg/dL — AB (ref 70–99)
Glucose-Capillary: 145 mg/dL — ABNORMAL HIGH (ref 70–99)
Glucose-Capillary: 161 mg/dL — ABNORMAL HIGH (ref 70–99)

## 2014-04-30 LAB — HEMOGLOBIN A1C
Hgb A1c MFr Bld: 6 % — ABNORMAL HIGH (ref 4.8–5.6)
Mean Plasma Glucose: 126 mg/dL

## 2014-04-30 NOTE — Progress Notes (Signed)
[facility] PROGRESS NOTE  RISE TRAEGER FIE:[id number] DOB: 1940/07/28 DOA: 04/28/2014 PCP: Walker Kehr, MD  Assessment/Plan: 1. Acute sigmoid diverticulitis with perforation 1. Pain worse today 2. Surgery following 3. Diet advanced to clears this AM 4. Pt is continued on zosyn and probiotic 5. Will obtain abd xray to r/o obstruction/ileus as pt is complaining of more pain 2. Dehydration 1. Cont hydration as tolerated 3. Migraines 1. Stable 2. None currently 4. Osteoarthritis 1. Stable 2. No active joint pains at present 5. DVT prophylaxis 1. SCD's  Code Status: Full Family Communication: Pt in room, husband at bedside Disposition Plan: Pending   Consultants:  Surgery  Procedures:    Antibiotics:  Zosyn 2/18>>> (indicate start date, and stop date if known)  HPI/Subjective: Complaining of worse abd pain with increased distension.   Objective: Filed Vitals:   04/29/14 1700 04/29/14 2145 04/30/14 0600 04/30/14 1402  BP:  114/75 117/46 117/50  Pulse:  89 86 86  Temp:  98.2 F (36.8 C) 99.2 F (37.3 C) 99 F (37.2 C)  TempSrc:  Oral Oral Oral  Resp:  20 20 18   Height: 5\' 2"  (1.575 m)     Weight: 52.164 kg (115 lb)     SpO2:  95% 92% 92%    Intake/Output Summary (Last 24 hours) at 04/30/14 1448 Last data filed at 04/30/14 1300  Gross per 24 hour  Intake 2985.42 ml  Output    403 ml  Net 2582.42 ml   Filed Weights   04/29/14 1700  Weight: 52.164 kg (115 lb)    Exam:   General:  Awake, in nad  Cardiovascular: regular, s1, s2  Respiratory: normal resp effort, no wheezing  Abdomen: soft,decreased BS, increased distension  Musculoskeletal: perfused,no clubbing   Data Reviewed: Basic Metabolic Panel:  Recent Labs Lab 04/28/14 1745 04/29/14 0605 04/30/14 0534  NA 136 139 134*  K 4.2 4.2 4.4  CL 106 109 105  CO2 25 23 22   GLUCOSE 141* 143* 160*  BUN 39* 29* 31*  CREATININE 0.86 0.90 1.17*  CALCIUM 8.6 7.9* 7.9*   Liver  Function Tests:  Recent Labs Lab 04/28/14 1745 04/29/14 0605  AST 20 21  ALT 15 12  ALKPHOS 68 55  BILITOT 0.7 1.1  PROT 6.6 5.8*  ALBUMIN 3.4* 2.9*    Recent Labs Lab 04/28/14 1745  LIPASE 26   No results for input(s): AMMONIA in the last 168 hours. CBC:  Recent Labs Lab 04/28/14 1745 04/29/14 0605 04/30/14 0534  WBC 5.5 9.6 16.2*  NEUTROABS 4.5 8.5* 15.1*  HGB 14.4 12.9 12.3  HCT 43.6 39.2 37.6  MCV 91.8 91.2 92.6  PLT 255 238 220   Cardiac Enzymes: No results for input(s): CKTOTAL, CKMB, CKMBINDEX, TROPONINI in the last 168 hours. BNP (last 3 results) No results for input(s): BNP in the last 8760 hours.  ProBNP (last 3 results) No results for input(s): PROBNP in the last 8760 hours.  CBG:  Recent Labs Lab 04/29/14 1142 04/29/14 1802 04/29/14 2356 04/30/14 0557 04/30/14 1203  GLUCAP 130* 146* 154* 147* 145*    No results found for this or any previous visit (from the past 240 hour(s)).   Studies: Dg Chest 2 View  04/28/2014   CLINICAL DATA:  Severe abdominal pain beginning at 1400 hr today.  EXAM: CHEST  2 VIEW  COMPARISON:  PA and lateral chest 03/21/2014 and 12/25/2012.  FINDINGS: Mild elevation of the right hemidiaphragm relative to the left is unchanged. Lungs are  clear. Heart size is normal. No pneumothorax or pleural effusion.  IMPRESSION: No acute disease.   Electronically Signed   By: Inge Rise M.D.   On: 04/28/2014 19:12   Dg Abd 1 View  04/29/2014   CLINICAL DATA:  Lower abdominal pain, question bowel perforation from diverticulitis.  EXAM: ABDOMEN - 1 VIEW  COMPARISON:  Abdominal CT from yesterday  FINDINGS: The bowel gas pattern is nonobstructive, with a single dilated loop of small bowel in the left abdomen, likely reactive to the intra-abdominal inflammation. There is known pneumoperitoneum from yesterday's study, but no evidence of increase. Left upper quadrant calcifications are granulomatous and splenic. No concerning  intra-abdominal mass effect. There is mild bibasilar atelectasis.  IMPRESSION: 1. Limited study for re- evaluating pneumoperitoneum due to supine positioning. 2. Focal, reactive ileus in the left abdomen.   Electronically Signed   By: Monte Fantasia M.D.   On: 04/29/2014 08:25   Ct Abdomen Pelvis W Contrast  04/28/2014   CLINICAL DATA:  Low abd pain onset 1400 today, n/v, h/o abdominal hysterectomy, oophorectomy, GERD  EXAM: CT ABDOMEN AND PELVIS WITH CONTRAST  TECHNIQUE: Multidetector CT imaging of the abdomen and pelvis was performed using the standard protocol following bolus administration of intravenous contrast.  CONTRAST:  173mL OMNIPAQUE IOHEXOL 300 MG/ML  SOLN  COMPARISON:  07/02/2010  FINDINGS: There is wall thickening along the mid to lower sigmoid colon where there are multiple diverticula. There is adjacent significant inflammatory stranding. Small bubbles of extraluminal air lie anterior to the mid sigmoid colon with several others noted along the anterior peritoneal cavity extending all the way superior to lie anterior to the liver. There is no formed fluid collection to suggest an abscess. Findings are most consistent with perforated diverticulitis.  There is dependent subsegmental atelectasis in the lower lobes. No convincing pneumonia or edema.  Low-density un enhancing liver lesions are noted consistent with cysts. There are 3 low-density splenic lesions which may also reflect cysts. There also splenic calcifications consistent with healed granuloma. Spleen is normal in size.  Gallbladder pancreas are unremarkable. No bile duct dilation. No adrenal masses.  Multiple bilateral renal cortical and renal sinus cysts. No hydronephrosis. Ureters normal in course and in caliber. Bladder is unremarkable.  Uterus surgically absent.  No pelvic masses.  There are several prominent mesenteric lymph nodes largest measuring 7 mm in short axis.  Small amount of ascites most of which collects in the posterior  pelvic recess.  Chronic bilateral pars defects at L5-S1 with a grade 1 anterolisthesis. There are degenerative changes most evident in the lower lumbar spine. No osteoblastic or osteolytic lesions.  IMPRESSION: 1. Acute sigmoid colon diverticulitis. There are findings of perforation of a diverticulum with adjacent extraluminal air as well as a small amount of free air. There is no evidence of an abscess. 2. No other acute findings.   Electronically Signed   By: Lajean Manes M.D.   On: 04/28/2014 19:51    Scheduled Meds: . ketorolac  15 mg Intravenous 4 times per day  . lip balm  1 application Topical BID  . piperacillin-tazobactam (ZOSYN)  IV  3.375 g Intravenous 3 times per day  . saccharomyces boulardii  250 mg Oral BID   Continuous Infusions: . 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 If 7PM-7AM, please contact night-coverage at [email or web address], password [facility] 04/30/2014, 2:48 PM  LOS: 2 days

## 2014-04-30 NOTE — Progress Notes (Signed)
After speaking with Dr. Wyline Copas concerning pt's increased abdominal pain, tearfulness, and request for abdominal scan--abdominal x-ray was ordered.  Results were called to Dr. Wyline Copas and [facility] Dr. Marcello Moores. No new orders received.

## 2014-04-30 NOTE — Progress Notes (Signed)
Patient ID: Kristen Castillo, female   DOB: [birth year], 74 y.o.   MRN: [id number]  [facility]  Subjective: Patient with mild pain, taking ice chips.  Ambulatory.  No flatus or BM.  Objective: Vital signs in last 24 hours: Temp:  [98 F (36.7 C)-99.2 F (37.3 C)] 99.2 F (37.3 C) (02/20 0600) Pulse Rate:  [85-89] 86 (02/20 0600) Resp:  [18-20] 20 (02/20 0600) BP: (99-117)/(41-75) 117/46 mmHg (02/20 0600) SpO2:  [92 %-95 %] 92 % (02/20 0600) Weight:  [115 lb (52.164 kg)] 115 lb (52.164 kg) (02/19 1700) Last BM Date: 04/28/14  Intake/Output from previous day: 02/19 0701 - 02/20 0700 In: 2985.4 [I.V.:2835.4; IV Piggyback:150] Out: 3 [Urine:3] Intake/Output this shift:    Physical Exam: HEENT - sclerae clear, mucous membranes moist Neck - soft Chest - clear bilaterally Cor - RRR Abdomen - moderate distension; few BS present; mild diffuse tenderness, increased in LLQ Ext - no edema, non-tender Neuro - alert & oriented, no focal deficits  Lab Results:   Recent Labs  04/29/14 0605 04/30/14 0534  WBC 9.6 16.2*  HGB 12.9 12.3  HCT 39.2 37.6  PLT 238 220   BMET  Recent Labs  04/29/14 0605 04/30/14 0534  NA 139 134*  K 4.2 4.4  CL 109 105  CO2 23 22  GLUCOSE 143* 160*  BUN 29* 31*  CREATININE 0.90 1.17*  CALCIUM 7.9* 7.9*   PT/INR No results for input(s): LABPROT, INR in the last 72 hours. Comprehensive Metabolic Panel:    Component Value Date/Time   NA 134* 04/30/2014 0534   NA 139 04/29/2014 0605   K 4.4 04/30/2014 0534   K 4.2 04/29/2014 0605   CL 105 04/30/2014 0534   CL 109 04/29/2014 0605   CO2 22 04/30/2014 0534   CO2 23 04/29/2014 0605   BUN 31* 04/30/2014 0534   BUN 29* 04/29/2014 0605   CREATININE 1.17* 04/30/2014 0534   CREATININE 0.90 04/29/2014 0605   GLUCOSE 160* 04/30/2014 0534   GLUCOSE 143* 04/29/2014 0605   CALCIUM 7.9* 04/30/2014 0534   CALCIUM 7.9* 04/29/2014 0605   AST 21 04/29/2014 0605   AST 20 04/28/2014 1745   ALT 12 04/29/2014 0605   ALT 15 04/28/2014 1745   ALKPHOS 55 04/29/2014 0605   ALKPHOS 68 04/28/2014 1745   BILITOT 1.1 04/29/2014 0605   BILITOT 0.7 04/28/2014 1745   PROT 5.8* 04/29/2014 0605   PROT 6.6 04/28/2014 1745   ALBUMIN 2.9* 04/29/2014 0605   ALBUMIN 3.4* 04/28/2014 1745    Studies/Results: Dg Chest 2 View  04/28/2014   CLINICAL DATA:  Severe abdominal pain beginning at 1400 hr today.  EXAM: CHEST  2 VIEW  COMPARISON:  PA and lateral chest 03/21/2014 and 12/25/2012.  FINDINGS: Mild elevation of the right hemidiaphragm relative to the left is unchanged. Lungs are clear. Heart size is normal. No pneumothorax or pleural effusion.  IMPRESSION: No acute disease.   Electronically Signed   By: Inge Rise M.D.   On: 04/28/2014 19:12   Dg Abd 1 View  04/29/2014   CLINICAL DATA:  Lower abdominal pain, question bowel perforation from diverticulitis.  EXAM: ABDOMEN - 1 VIEW  COMPARISON:  Abdominal CT from yesterday  FINDINGS: The bowel gas pattern is nonobstructive, with a single dilated loop of small bowel in the left abdomen, likely reactive to the intra-abdominal inflammation. There is known pneumoperitoneum from yesterday's study, but no evidence of increase. Left upper quadrant calcifications  are granulomatous and splenic. No concerning intra-abdominal mass effect. There is mild bibasilar atelectasis.  IMPRESSION: 1. Limited study for re- evaluating pneumoperitoneum due to supine positioning. 2. Focal, reactive ileus in the left abdomen.   Electronically Signed   By: Monte Fantasia M.D.   On: 04/29/2014 08:25   Ct Abdomen Pelvis W Contrast  04/28/2014   CLINICAL DATA:  Low abd pain onset 1400 today, n/v, h/o abdominal hysterectomy, oophorectomy, GERD  EXAM: CT ABDOMEN AND PELVIS WITH CONTRAST  TECHNIQUE: Multidetector CT imaging of the abdomen and pelvis was performed using the standard protocol following bolus administration of intravenous contrast.   CONTRAST:  132mL OMNIPAQUE IOHEXOL 300 MG/ML  SOLN  COMPARISON:  07/02/2010  FINDINGS: There is wall thickening along the mid to lower sigmoid colon where there are multiple diverticula. There is adjacent significant inflammatory stranding. Small bubbles of extraluminal air lie anterior to the mid sigmoid colon with several others noted along the anterior peritoneal cavity extending all the way superior to lie anterior to the liver. There is no formed fluid collection to suggest an abscess. Findings are most consistent with perforated diverticulitis.  There is dependent subsegmental atelectasis in the lower lobes. No convincing pneumonia or edema.  Low-density un enhancing liver lesions are noted consistent with cysts. There are 3 low-density splenic lesions which may also reflect cysts. There also splenic calcifications consistent with healed granuloma. Spleen is normal in size.  Gallbladder pancreas are unremarkable. No bile duct dilation. No adrenal masses.  Multiple bilateral renal cortical and renal sinus cysts. No hydronephrosis. Ureters normal in course and in caliber. Bladder is unremarkable.  Uterus surgically absent.  No pelvic masses.  There are several prominent mesenteric lymph nodes largest measuring 7 mm in short axis.  Small amount of ascites most of which collects in the posterior pelvic recess.  Chronic bilateral pars defects at L5-S1 with a grade 1 anterolisthesis. There are degenerative changes most evident in the lower lumbar spine. No osteoblastic or osteolytic lesions.  IMPRESSION: 1. Acute sigmoid colon diverticulitis. There are findings of perforation of a diverticulum with adjacent extraluminal air as well as a small amount of free air. There is no evidence of an abscess. 2. No other acute findings.   Electronically Signed   By: Lajean Manes M.D.   On: 04/28/2014 19:51    Anti-infectives: Anti-infectives    Start     Dose/Rate Route Frequency Ordered Stop   04/29/14 0400   piperacillin-tazobactam (ZOSYN) IVPB 3.375 g  Status:  Discontinued     3.375 g 12.5 mL/hr over 240 Minutes Intravenous 3 times per day 04/28/14 2103 04/28/14 2247   04/29/14 0400  piperacillin-tazobactam (ZOSYN) IVPB 3.375 g     3.375 g 12.5 mL/hr over 240 Minutes Intravenous 3 times per day 04/28/14 2250     04/28/14 2100  metroNIDAZOLE (FLAGYL) IVPB 500 mg  Status:  Discontinued     500 mg 100 mL/hr over 60 Minutes Intravenous Every 6 hours 04/28/14 2049 04/28/14 2053   04/28/14 2045  piperacillin-tazobactam (ZOSYN) IVPB 3.375 g     3.375 g 100 mL/hr over 30 Minutes Intravenous  Once 04/28/14 2043 04/28/14 2133      Assessment & Plans: Acute diverticulitis with microperforation  IV Zosyn  Allow clear liquid diet today  Ambulate  Plan to repeat CT scan Monday or Tuesday  Monitor rising WBC  Earnstine Regal, MD, [facility] Office: [place] 04/30/2014

## 2014-04-30 NOTE — Progress Notes (Signed)
Per Dr. Marcello Moores of [facility] -- pt will be NPO with ice chips.

## 2014-05-01 DIAGNOSIS — K5669 Other intestinal obstruction: Secondary | ICD-10-CM

## 2014-05-01 LAB — CBC WITH DIFFERENTIAL/PLATELET
BASOS ABS: 0 10*3/uL (ref 0.0–0.1)
Basophils Relative: 0 % (ref 0–1)
EOS ABS: 0 10*3/uL (ref 0.0–0.7)
EOS PCT: 0 % (ref 0–5)
HCT: 33.9 % — ABNORMAL LOW (ref 36.0–46.0)
Hemoglobin: 11.1 g/dL — ABNORMAL LOW (ref 12.0–15.0)
Lymphocytes Relative: 5 % — ABNORMAL LOW (ref 12–46)
Lymphs Abs: 0.5 10*3/uL — ABNORMAL LOW (ref 0.7–4.0)
MCH: 29.8 pg (ref 26.0–34.0)
MCHC: 32.7 g/dL (ref 30.0–36.0)
MCV: 91.1 fL (ref 78.0–100.0)
MONO ABS: 0.2 10*3/uL (ref 0.1–1.0)
Monocytes Relative: 2 % — ABNORMAL LOW (ref 3–12)
NEUTROS ABS: 9.8 10*3/uL — AB (ref 1.7–7.7)
NEUTROS PCT: 93 % — AB (ref 43–77)
PLATELETS: 210 10*3/uL (ref 150–400)
RBC: 3.72 MIL/uL — AB (ref 3.87–5.11)
RDW: 14 % (ref 11.5–15.5)
WBC: 10.6 10*3/uL — AB (ref 4.0–10.5)

## 2014-05-01 LAB — COMPREHENSIVE METABOLIC PANEL
ALBUMIN: 2.2 g/dL — AB (ref 3.5–5.2)
ALT: 9 U/L (ref 0–35)
AST: 16 U/L (ref 0–37)
Alkaline Phosphatase: 58 U/L (ref 39–117)
Anion gap: 8 (ref 5–15)
BUN: 26 mg/dL — ABNORMAL HIGH (ref 6–23)
CO2: 19 mmol/L (ref 19–32)
Calcium: 7.7 mg/dL — ABNORMAL LOW (ref 8.4–10.5)
Chloride: 109 mmol/L (ref 96–112)
Creatinine, Ser: 1.03 mg/dL (ref 0.50–1.10)
GFR calc Af Amer: 61 mL/min — ABNORMAL LOW (ref >90)
GFR calc non Af Amer: 53 mL/min — ABNORMAL LOW (ref >90)
GLUCOSE: 172 mg/dL — AB (ref 70–99)
POTASSIUM: 4.7 mmol/L (ref 3.5–5.1)
Sodium: 136 mmol/L (ref 135–145)
Total Bilirubin: 1 mg/dL (ref 0.3–1.2)
Total Protein: 5.7 g/dL — ABNORMAL LOW (ref 6.0–8.3)

## 2014-05-01 LAB — GLUCOSE, CAPILLARY
GLUCOSE-CAPILLARY: 146 mg/dL — AB (ref 70–99)
GLUCOSE-CAPILLARY: 157 mg/dL — AB (ref 70–99)
Glucose-Capillary: 127 mg/dL — ABNORMAL HIGH (ref 70–99)
Glucose-Capillary: 126 mg/dL — ABNORMAL HIGH (ref 70–99)

## 2014-05-01 NOTE — Progress Notes (Signed)
Patient ID: Kristen Castillo, female   DOB: November 09, 1940, 74 y.o.   MRN: [id number]  [facility]  Subjective: Patient with some pain, taking ice chips.  Ambulatory.  Had a BM last night.  Felt better after that  Objective: Vital signs in last 24 hours: Temp:  [98 F (36.7 C)-99 F (37.2 C)] 98 F (36.7 C) (02/21 0657) Pulse Rate:  [77-98] 77 (02/21 0657) Resp:  [18] 18 (02/21 0657) BP: (117-121)/(47-57) 121/57 mmHg (02/21 0657) SpO2:  [92 %-93 %] 93 % (02/21 0657) Last BM Date: 04/28/14  Intake/Output from previous day: 02/20 0701 - 02/21 0700 In: 1500 [I.V.:1500] Out: 401 [Urine:400; Stool:1] Intake/Output this shift:    Physical Exam: HEENT - sclerae clear, mucous membranes moist Abdomen - moderate distension; mild diffuse tenderness, increased in LLQ Ext - no edema, non-tender Neuro - alert & oriented, no focal deficits  Lab Results:   Recent Labs  04/30/14 0534 05/01/14 0522  WBC 16.2* 10.6*  HGB 12.3 11.1*  HCT 37.6 33.9*  PLT 220 210   BMET  Recent Labs  04/30/14 0534 05/01/14 0522  NA 134* 136  K 4.4 4.7  CL 105 109  CO2 22 19  GLUCOSE 160* 172*  BUN 31* 26*  CREATININE 1.17* 1.03  CALCIUM 7.9* 7.7*   PT/INR No results for input(s): LABPROT, INR in the last 72 hours. Comprehensive Metabolic Panel:    Component Value Date/Time   NA 136 05/01/2014 0522   NA 134* 04/30/2014 0534   K 4.7 05/01/2014 0522   K 4.4 04/30/2014 0534   CL 109 05/01/2014 0522   CL 105 04/30/2014 0534   CO2 19 05/01/2014 0522   CO2 22 04/30/2014 0534   BUN 26* 05/01/2014 0522   BUN 31* 04/30/2014 0534   CREATININE 1.03 05/01/2014 0522   CREATININE 1.17* 04/30/2014 0534   GLUCOSE 172* 05/01/2014 0522   GLUCOSE 160* 04/30/2014 0534   CALCIUM 7.7* 05/01/2014 0522   CALCIUM 7.9* 04/30/2014 0534   AST 16 05/01/2014 0522   AST 21 04/29/2014 0605   ALT 9 05/01/2014 0522   ALT 12 04/29/2014 0605   ALKPHOS 58 05/01/2014 0522   ALKPHOS 55 04/29/2014 0605   BILITOT 1.0 05/01/2014 0522   BILITOT 1.1 04/29/2014 0605   PROT 5.7* 05/01/2014 0522   PROT 5.8* 04/29/2014 0605   ALBUMIN 2.2* 05/01/2014 0522   ALBUMIN 2.9* 04/29/2014 0605    Studies/Results: Dg Abd 1 View  04/29/2014   CLINICAL DATA:  Lower abdominal pain, question bowel perforation from diverticulitis.  EXAM: ABDOMEN - 1 VIEW  COMPARISON:  Abdominal CT from yesterday  FINDINGS: The bowel gas pattern is nonobstructive, with a single dilated loop of small bowel in the left abdomen, likely reactive to the intra-abdominal inflammation. There is known pneumoperitoneum from yesterday's study, but no evidence of increase. Left upper quadrant calcifications are granulomatous and splenic. No concerning intra-abdominal mass effect. There is mild bibasilar atelectasis.  IMPRESSION: 1. Limited study for re- evaluating pneumoperitoneum due to supine positioning. 2. Focal, reactive ileus in the left abdomen.   Electronically Signed   By: Monte Fantasia M.D.   On: 04/29/2014 08:25   Dg Abd Portable 1v  04/30/2014   CLINICAL DATA:  Constipation, abdominal distension, diverticulitis by recent CT  EXAM: PORTABLE ABDOMEN - 1 VIEW  COMPARISON:  04/29/2014  FINDINGS: Stool present in rectum.  Minimal gas and stool throughout remainder of colon.  Multiple dilated loops of small bowel  are seen increased in size and number since the previous exam with largest small bowel loop measuring 3.5 cm diameter.  No definite bowel wall thickening.  Calcified splenic granulomata.  Bones demineralized.  IMPRESSION: Increased small bowel dilatation since previous exam in patient with evidence of acute diverticulitis by CT.  This could represent developing small bowel obstruction or ileus.   Electronically Signed   By: Lavonia Dana M.D.   On: 04/30/2014 15:35    Anti-infectives: Anti-infectives    Start     Dose/Rate Route Frequency Ordered Stop   04/29/14 0400  piperacillin-tazobactam (ZOSYN) IVPB  3.375 g  Status:  Discontinued     3.375 g 12.5 mL/hr over 240 Minutes Intravenous 3 times per day 04/28/14 2103 04/28/14 2247   04/29/14 0400  piperacillin-tazobactam (ZOSYN) IVPB 3.375 g     3.375 g 12.5 mL/hr over 240 Minutes Intravenous 3 times per day 04/28/14 2250     04/28/14 2100  metroNIDAZOLE (FLAGYL) IVPB 500 mg  Status:  Discontinued     500 mg 100 mL/hr over 60 Minutes Intravenous Every 6 hours 04/28/14 2049 04/28/14 2053   04/28/14 2045  piperacillin-tazobactam (ZOSYN) IVPB 3.375 g     3.375 g 100 mL/hr over 30 Minutes Intravenous  Once 04/28/14 2043 04/28/14 2133      Assessment & Plans: Acute diverticulitis with microperforation  IV Zosyn  Allow clear liquid diet today as tolerated  Ambulate  Plan to repeat Abd films on Mon.  If pt still has an ileus, she should get a repeat CT  WBC trending down     [name] C. 5/75/0518

## 2014-05-01 NOTE — Progress Notes (Signed)
ANTIBIOTIC CONSULT NOTE - FOLLOW UP  Pharmacy Consult for Zosyn Indication: Intra-abdominal infection  Allergies  Allergen Reactions  . Codeine Nausea And Vomiting  . Sulfonamide Derivatives Other (See Comments)    Childhood allergy; reaction unknown    Patient Measurements: Height: 5\' 2"  (157.5 cm) (per pt) Weight: 115 lb (52.164 kg) (per pt) IBW/kg (Calculated) : 50.1 Adjusted Body Weight:   Vital Signs: Temp: 98 F (36.7 C) (02/21 0657) Temp Source: Oral (02/21 0657) BP: 121/57 mmHg (02/21 0657) Pulse Rate: 77 (02/21 0657) Intake/Output from previous day: 02/20 0701 - 02/21 0700 In: 1500 [I.V.:1500] Out: 401 [Urine:400; Stool:1] Intake/Output from this shift:    Labs:  Recent Labs  04/29/14 0605 04/30/14 0534 05/01/14 0522  WBC 9.6 16.2* 10.6*  HGB 12.9 12.3 11.1*  PLT 238 220 210  CREATININE 0.90 1.17* 1.03   Estimated Creatinine Clearance: 38.5 mL/min (by C-G formula based on Cr of 1.03). No results for input(s): VANCOTROUGH, VANCOPEAK, VANCORANDOM, GENTTROUGH, GENTPEAK, GENTRANDOM, TOBRATROUGH, TOBRAPEAK, TOBRARND, AMIKACINPEAK, AMIKACINTROU, AMIKACIN in the last 72 hours.   Microbiology: No results found for this or any previous visit (from the past 720 hour(s)).  Anti-infectives    Start     Dose/Rate Route Frequency Ordered Stop   04/29/14 0400  piperacillin-tazobactam (ZOSYN) IVPB 3.375 g  Status:  Discontinued     3.375 g 12.5 mL/hr over 240 Minutes Intravenous 3 times per day 04/28/14 2103 04/28/14 2247   04/29/14 0400  piperacillin-tazobactam (ZOSYN) IVPB 3.375 g     3.375 g 12.5 mL/hr over 240 Minutes Intravenous 3 times per day 04/28/14 2250     04/28/14 2100  metroNIDAZOLE (FLAGYL) IVPB 500 mg  Status:  Discontinued     500 mg 100 mL/hr over 60 Minutes Intravenous Every 6 hours 04/28/14 2049 04/28/14 2053   04/28/14 2045  piperacillin-tazobactam (ZOSYN) IVPB 3.375 g     3.375 g 100 mL/hr over 30 Minutes Intravenous  Once 04/28/14 2043  04/28/14 2133      Assessment: 73 YOF admitted for diverticulitis w/ perforation on 2/18.Received zosyn 3.375 g x 1 in ED. Pharmacy has been consulted to dose Zosyn for intra-abdominal infection.  2/18 CT abd: Acute sigmoid colon diverticulitis. There are findings of perforation of a diverticulum with adjacent extraluminal air as well as a small amount of free air. There is no evidence of an abscess  2/18 >> Zosyn >>  Tmax: Afebrile WBCs: Improved back to almost WNL Renal: Sl bump yest, now back down to 1.03, CrCl ~39  No cultures taken  Goal of Therapy:  Eradication of infection  Plan:  Continue Zosyn 3.375g IV q8h (infuse over 4 hours)  Ralene Bathe, PharmD, BCPS 05/01/2014, 2:10 PM  Pager: [phone]

## 2014-05-01 NOTE — Progress Notes (Signed)
[facility] PROGRESS NOTE  Kristen Castillo RFF:[id number] DOB: [birth year] DOA: 04/28/2014 PCP: Walker Kehr, MD  Assessment/Plan: 1. Acute sigmoid diverticulitis with perforation 1. Xray abd with early sbo yesterday with diet changed to ice chips only 2. Pain improved today 3. Surgery following 4. Diet advanced to clears from ice chips today 5. Pt is continued on zosyn and probiotic 6. Continue to advise ambulation as tolerated 2. Dehydration 1. Cont hydration as tolerated 2. Improving 3. Migraines 1. Stable 2. None currently 4. Osteoarthritis 1. Stable 2. No active joint pains at present 5. DVT prophylaxis 1. SCD's  Code Status: Full Family Communication: Pt in room, family at bedside Disposition Plan: Pending   Consultants:  Surgery  Procedures:    Antibiotics:  Zosyn 2/18>>> (indicate start date, and stop date if known)  HPI/Subjective: Feels much better today. No acute events noted   Objective: Filed Vitals:   04/30/14 1402 04/30/14 2220 05/01/14 0657 05/01/14 1500  BP: 117/50 119/47 121/57 109/67  Pulse: 86 98 77 90  Temp: 99 F (37.2 C) 98.1 F (36.7 C) 98 F (36.7 C) 97.6 F (36.4 C)  TempSrc: Oral Oral Oral Axillary  Resp: 18 18 18 18   Height:      Weight:      SpO2: 92% 93% 93% 94%    Intake/Output Summary (Last 24 hours) at 05/01/14 1604 Last data filed at 04/30/14 2205  Gross per 24 hour  Intake   1500 ml  Output      1 ml  Net   1499 ml   Filed Weights   04/29/14 1700  Weight: 52.164 kg (115 lb)    Exam:   General:  Awake, in nad  Cardiovascular: regular, s1, s2  Respiratory: normal resp effort, no wheezing  Abdomen: soft, pos BS, nondistended  Musculoskeletal: perfused,no clubbing   Data Reviewed: Basic Metabolic Panel:  Recent Labs Lab 04/28/14 1745 04/29/14 0605 04/30/14 0534 05/01/14 0522  NA 136 139 134* 136  K 4.2 4.2 4.4 4.7  CL 106 109 105 109  CO2 25 23 22 19   GLUCOSE 141* 143* 160* 172*   BUN 39* 29* 31* 26*  CREATININE 0.86 0.90 1.17* 1.03  CALCIUM 8.6 7.9* 7.9* 7.7*   Liver Function Tests:  Recent Labs Lab 04/28/14 1745 04/29/14 0605 05/01/14 0522  AST 20 21 16   ALT 15 12 9   ALKPHOS 68 55 58  BILITOT 0.7 1.1 1.0  PROT 6.6 5.8* 5.7*  ALBUMIN 3.4* 2.9* 2.2*    Recent Labs Lab 04/28/14 1745  LIPASE 26   No results for input(s): AMMONIA in the last 168 hours. CBC:  Recent Labs Lab 04/28/14 1745 04/29/14 0605 04/30/14 0534 05/01/14 0522  WBC 5.5 9.6 16.2* 10.6*  NEUTROABS 4.5 8.5* 15.1* 9.8*  HGB 14.4 12.9 12.3 11.1*  HCT 43.6 39.2 37.6 33.9*  MCV 91.8 91.2 92.6 91.1  PLT 255 238 220 210   Cardiac Enzymes: No results for input(s): CKTOTAL, CKMB, CKMBINDEX, TROPONINI in the last 168 hours. BNP (last 3 results) No results for input(s): BNP in the last 8760 hours.  ProBNP (last 3 results) No results for input(s): PROBNP in the last 8760 hours.  CBG:  Recent Labs Lab 04/30/14 1203 04/30/14 1801 05/01/14 0206 05/01/14 0653 05/01/14 1135  GLUCAP 145* 161* 146* 157* 127*    No results found for this or any previous visit (from the past 240 hour(s)).   Studies: Dg Abd Portable 1v  04/30/2014   CLINICAL DATA:  Constipation,  abdominal distension, diverticulitis by recent CT  EXAM: PORTABLE ABDOMEN - 1 VIEW  COMPARISON:  04/29/2014  FINDINGS: Stool present in rectum.  Minimal gas and stool throughout remainder of colon.  Multiple dilated loops of small bowel are seen increased in size and number since the previous exam with largest small bowel loop measuring 3.5 cm diameter.  No definite bowel wall thickening.  Calcified splenic granulomata.  Bones demineralized.  IMPRESSION: Increased small bowel dilatation since previous exam in patient with evidence of acute diverticulitis by CT.  This could represent developing small bowel obstruction or ileus.   Electronically Signed   By: Lavonia Dana M.D.   On: 04/30/2014 15:35    Scheduled Meds: . lip  balm  1 application Topical BID  . piperacillin-tazobactam (ZOSYN)  IV  3.375 g Intravenous 3 times per day  . saccharomyces boulardii  250 mg Oral BID   Continuous Infusions: . 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 If 7PM-7AM, please contact night-coverage at [email or web address], password [facility] 05/01/2014, 4:04 PM  LOS: 3 days

## 2014-05-02 ENCOUNTER — Inpatient Hospital Stay (HOSPITAL_COMMUNITY): Payer: Medicare Other

## 2014-05-02 ENCOUNTER — Encounter (HOSPITAL_COMMUNITY): Payer: Self-pay | Admitting: Radiology

## 2014-05-02 DIAGNOSIS — K668 Other specified disorders of peritoneum: Secondary | ICD-10-CM

## 2014-05-02 DIAGNOSIS — K5660 Unspecified intestinal obstruction: Secondary | ICD-10-CM

## 2014-05-02 LAB — CBC WITH DIFFERENTIAL/PLATELET
BASOS PCT: 0 % (ref 0–1)
Basophils Absolute: 0 10*3/uL (ref 0.0–0.1)
EOS ABS: 0.2 10*3/uL (ref 0.0–0.7)
Eosinophils Relative: 1 % (ref 0–5)
HCT: 33.2 % — ABNORMAL LOW (ref 36.0–46.0)
HEMOGLOBIN: 10.7 g/dL — AB (ref 12.0–15.0)
Lymphocytes Relative: 6 % — ABNORMAL LOW (ref 12–46)
Lymphs Abs: 0.7 10*3/uL (ref 0.7–4.0)
MCH: 29.3 pg (ref 26.0–34.0)
MCHC: 32.2 g/dL (ref 30.0–36.0)
MCV: 91 fL (ref 78.0–100.0)
Monocytes Absolute: 0.4 10*3/uL (ref 0.1–1.0)
Monocytes Relative: 4 % (ref 3–12)
Neutro Abs: 9.8 10*3/uL — ABNORMAL HIGH (ref 1.7–7.7)
Neutrophils Relative %: 89 % — ABNORMAL HIGH (ref 43–77)
Platelets: 224 10*3/uL (ref 150–400)
RBC: 3.65 MIL/uL — ABNORMAL LOW (ref 3.87–5.11)
RDW: 14.2 % (ref 11.5–15.5)
WBC: 11.1 10*3/uL — ABNORMAL HIGH (ref 4.0–10.5)

## 2014-05-02 LAB — BASIC METABOLIC PANEL
ANION GAP: 3 — AB (ref 5–15)
BUN: 18 mg/dL (ref 6–23)
CALCIUM: 8 mg/dL — AB (ref 8.4–10.5)
CO2: 21 mmol/L (ref 19–32)
Chloride: 113 mmol/L — ABNORMAL HIGH (ref 96–112)
Creatinine, Ser: 0.83 mg/dL (ref 0.50–1.10)
GFR calc Af Amer: 79 mL/min — ABNORMAL LOW (ref >90)
GFR, EST NON AFRICAN AMERICAN: 68 mL/min — AB (ref >90)
Glucose, Bld: 138 mg/dL — ABNORMAL HIGH (ref 70–99)
POTASSIUM: 4.6 mmol/L (ref 3.5–5.1)
Sodium: 137 mmol/L (ref 135–145)

## 2014-05-02 LAB — GLUCOSE, CAPILLARY
GLUCOSE-CAPILLARY: 115 mg/dL — AB (ref 70–99)
GLUCOSE-CAPILLARY: 122 mg/dL — AB (ref 70–99)
Glucose-Capillary: 113 mg/dL — ABNORMAL HIGH (ref 70–99)

## 2014-05-02 MED ORDER — IPRATROPIUM-ALBUTEROL 0.5-2.5 (3) MG/3ML IN SOLN
3.0000 mL | Freq: Once | RESPIRATORY_TRACT | Status: AC
  Administered 2014-05-02: 3 mL via RESPIRATORY_TRACT
  Filled 2014-05-02: qty 3

## 2014-05-02 MED ORDER — IOHEXOL 300 MG/ML  SOLN
25.0000 mL | INTRAMUSCULAR | Status: AC
  Administered 2014-05-02: 25 mL via ORAL

## 2014-05-02 MED ORDER — IOHEXOL 300 MG/ML  SOLN
80.0000 mL | Freq: Once | INTRAMUSCULAR | Status: AC | PRN
  Administered 2014-05-02: 80 mL via INTRAVENOUS

## 2014-05-02 MED ORDER — PANTOPRAZOLE SODIUM 40 MG IV SOLR
40.0000 mg | INTRAVENOUS | Status: DC
  Administered 2014-05-02: 40 mg via INTRAVENOUS
  Filled 2014-05-02 (×2): qty 40

## 2014-05-02 MED ORDER — ALBUTEROL SULFATE (2.5 MG/3ML) 0.083% IN NEBU
2.5000 mg | INHALATION_SOLUTION | Freq: Once | RESPIRATORY_TRACT | Status: AC
  Administered 2014-05-02: 2.5 mg via RESPIRATORY_TRACT
  Filled 2014-05-02: qty 3

## 2014-05-02 NOTE — Treatment Plan (Signed)
Received call from Radiology, initially to incorrect return number so unable to connect with radiologist. Pt with increased free air and multiple large fluid collections worrisome for abscesses. Have notified General Surgery. Pt currently with NPO status and continued on zosyn. Will follow per Surgery recs.

## 2014-05-02 NOTE — Progress Notes (Signed)
Subjective: She is still distended, not much in the way of bowel sounds.  She doesn't feel better.  She says she is wheezing, I hear a few basilar rales, but no wheezing right now.  She isn't doing IS.    Objective: Vital signs in last 24 hours: Temp:  [97.6 F (36.4 C)-98 F (36.7 C)] 97.7 F (36.5 C) 2022-06-01 0550) Pulse Rate:  [90-95] 95 06/01/22 0550) Resp:  [18-20] 20 06/01/2022 0550) BP: (109-155)/(61-74) 145/61 mmHg Jun 01, 2022 0550) SpO2:  [94 %-96 %] 96 % Jun 01, 2022 0654) Last BM Date: 05/01/14 Not much on I/O recorded 1 BM Afebrile, VSS AM labs OK, WBC stable with ongoing left shift, creatinine is 0.83 Films this AM shows:  Increase amount of free intraperitoneal air as detailed above.   Abnormal bowel gas pattern may reflect partial small bowel obstruction as versus ileus.   Intake/Output from previous day: 02/21 0701 - 06/01/22 0700 In: 1500 [I.V.:1500] Out: 250 [Urine:250] Intake/Output this shift:    General appearance: alert, cooperative, no distress and anxious. Resp: few rales in the bases. GI: distended, not really tender, very hypoactive bowel sounds.    Lab Results:   Recent Labs  05/01/14 0522 06-01-2014 0525  WBC 10.6* 11.1*  HGB 11.1* 10.7*  HCT 33.9* 33.2*  PLT 210 224    BMET  Recent Labs  05/01/14 0522 2014-06-01 0525  NA 136 137  K 4.7 4.6  CL 109 113*  CO2 19 21  GLUCOSE 172* 138*  BUN 26* 18  CREATININE 1.03 0.83  CALCIUM 7.7* 8.0*   PT/INR No results for input(s): LABPROT, INR in the last 72 hours.   Recent Labs Lab 04/28/14 1745 04/29/14 0605 05/01/14 0522  AST 20 21 16   ALT 15 12 9   ALKPHOS 68 55 58  BILITOT 0.7 1.1 1.0  PROT 6.6 5.8* 5.7*  ALBUMIN 3.4* 2.9* 2.2*     Lipase     Component Value Date/Time   LIPASE 26 04/28/2014 1745     Studies/Results: Dg Abd 2 Views  2014/06/01   CLINICAL DATA:  74 year old female with recent CT diagnosis of diverticulitis. Subsequent encounter.  EXAM: ABDOMEN - 2 VIEW  COMPARISON:   04/30/2014 and 04/29/2014 abdominal plain films. 04/28/2014 pelvic CT. 04/28/2014 chest x-ray  FINDINGS: When compared to the prior chest x-ray, patient has now developed free intraperitoneal air below the right hemidiaphragm. On the prior CT, extra luminal gas was noted surrounding abnormal appearing sigmoid colon. The progression of free intraperitoneal air may represent gas extending from the perforated sigmoid diverticula. If further delineation is clinically desired, CT imaging may be considered.  Gas filled small and large bowel. The small bowel loops appear dilated with differential air-fluid levels. This may reflect result of partial small bowel obstruction as versus ileus from a sigmoid colon abnormality.  Bibasilar consolidation suggestive of atelectasis for which may be related to splinting.  IMPRESSION: Increase amount of free intraperitoneal air as detailed above.  Abnormal bowel gas pattern may reflect partial small bowel obstruction as versus ileus.  Right lateral basilar consolidation.  These results will be called to the ordering clinician or representative by the Radiologist Assistant, and communication documented in the PACS or zVision Dashboard.   Electronically Signed   By: Genia Del M.D.   On: 06/01/14 08:49   Dg Abd Portable 1v  04/30/2014   CLINICAL DATA:  Constipation, abdominal distension, diverticulitis by recent CT  EXAM: PORTABLE ABDOMEN - 1 VIEW  COMPARISON:  04/29/2014  FINDINGS: Stool  present in rectum.  Minimal gas and stool throughout remainder of colon.  Multiple dilated loops of small bowel are seen increased in size and number since the previous exam with largest small bowel loop measuring 3.5 cm diameter.  No definite bowel wall thickening.  Calcified splenic granulomata.  Bones demineralized.  IMPRESSION: Increased small bowel dilatation since previous exam in patient with evidence of acute diverticulitis by CT.  This could represent developing small bowel obstruction  or ileus.   Electronically Signed   By: Lavonia Dana M.D.   On: 04/30/2014 15:35    Medications: . lip balm  1 application Topical BID  . piperacillin-tazobactam (ZOSYN)  IV  3.375 g Intravenous 3 times per day  . saccharomyces boulardii  250 mg Oral BID    Assessment/Plan Diverticulitis of colon with perforation Hx of diverticulosis Hx of migraines Osteoarthritis Osteoporosis Glaucoma SCD for DVT prophylaxis   Plan:  Repeat CT scan now.  Update labs, and check coagulation studies and prealbumin in AM.    LOS: 4 days    Mandel Seiden 05/02/2014

## 2014-05-02 NOTE — Progress Notes (Signed)
CT reviewed and discussed with Kristen Castillo and her husband.  The CT shows multiple intraabdominal abscesses and more free air consistent with a worsening of the process.  I recommended laparoscopic assisted partial colectomy, drainage of abscesses and colostomy. I have explained the procedure and risks of colon resection.  Risks include but are not limited to bleeding, infection, wound problems, anesthesia, problems with colostomy, need for reoperative surgery,  injury to intraabominal organs (such as intestine, spleen, kidney, bladder, ureter, etc.), ileus, irregular bowel habits, prolonged recovery. They seem to understand and agree with the plan.

## 2014-05-02 NOTE — Progress Notes (Signed)
CARE MANAGEMENT NOTE 05/02/2014  Patient:  Kristen Castillo, Kristen Castillo   Account Number:  [id number]  Date Initiated:  05/02/2014  Documentation initiated by:  Edwyna Shell  Subjective/Objective Assessment:   74 yo female admitted with diverticulitis with perforation from home     Action/Plan:   discharge planning   Anticipated DC Date:  05/03/2014   Anticipated DC Plan:  [place]  CM consult      Choice offered to / List presented to:             Status of service:  Completed, signed off Medicare Important Message given?  YES (If response is "NO", the following Medicare IM given date fields will be blank) Date Medicare IM given:  05/02/2014 Medicare IM given by:  Edwyna Shell Date Additional Medicare IM given:   Additional Medicare IM given by:    Discharge Disposition:  HOME/SELF CARE  Per UR Regulation:    If discussed at Long Length of Stay Meetings, dates discussed:    Comments:  05/02/14 Edwyna Shell RN BSN CM [phone] Patient lives at home with spouse, has no DME, drives self, has PCP and pharmacy. No needs

## 2014-05-02 NOTE — Progress Notes (Signed)
[facility] PROGRESS NOTE  Kristen Castillo KZS:[id number] DOB: Oct 16, 1940 DOA: 04/28/2014 PCP: Walker Kehr, MD  Assessment/Plan: 1. Acute sigmoid diverticulitis with perforation 1. Pain overall improved with BM 2. Surgery following 3. More free air noted on repeat imaging 4. Diet was advanced to clears on 2/21, back to NPO today 5. CT abd ordered, pending 6. Pt is continued on zosyn and probiotic 7. Continue to advise ambulation as tolerated 8. Considerations for possible surgery pending CT results 2. Dehydration 1. Cont hydration as tolerated 2. Stable 3. Migraines 1. Stable 2. None currently 4. Osteoarthritis 1. Stable 2. No active joint pains at present 5. DVT prophylaxis 1. SCD's  Code Status: Full Family Communication: Pt in room, family at bedside Disposition Plan: Pending   Consultants:  Surgery  Procedures:    Antibiotics:  Zosyn 2/18>>> (indicate start date, and stop date if known)  HPI/Subjective: No major complaints. Feels better with ambulation  Objective: Filed Vitals:   05/01/14 1500 05/01/14 2059 05/02/14 0550 05/02/14 0654  BP: 109/67 155/74 145/61   Pulse: 90 91 95   Temp: 97.6 F (36.4 C) 98 F (36.7 C) 97.7 F (36.5 C)   TempSrc: Axillary Oral Oral   Resp: 18 18 20    Height:      Weight:      SpO2: 94% 95% 96% 96%    Intake/Output Summary (Last 24 hours) at 05/02/14 1612 Last data filed at 05/01/14 1905  Gross per 24 hour  Intake   1500 ml  Output      0 ml  Net   1500 ml   Filed Weights   04/29/14 1700  Weight: 52.164 kg (115 lb)    Exam:   General:  Awake, laying in bed, in nad  Cardiovascular: regular, s1, s2  Respiratory: normal resp effort, no wheezing  Abdomen: soft, pos BS, nondistended  Musculoskeletal: perfused,no clubbing   Data Reviewed: Basic Metabolic Panel:  Recent Labs Lab 04/28/14 1745 04/29/14 0605 04/30/14 0534 05/01/14 0522 05/02/14 0525  NA 136 139 134* 136 137  K 4.2 4.2  4.4 4.7 4.6  CL 106 109 105 109 113*  CO2 25 23 22 19 21   GLUCOSE 141* 143* 160* 172* 138*  BUN 39* 29* 31* 26* 18  CREATININE 0.86 0.90 1.17* 1.03 0.83  CALCIUM 8.6 7.9* 7.9* 7.7* 8.0*   Liver Function Tests:  Recent Labs Lab 04/28/14 1745 04/29/14 0605 05/01/14 0522  AST 20 21 16   ALT 15 12 9   ALKPHOS 68 55 58  BILITOT 0.7 1.1 1.0  PROT 6.6 5.8* 5.7*  ALBUMIN 3.4* 2.9* 2.2*    Recent Labs Lab 04/28/14 1745  LIPASE 26   No results for input(s): AMMONIA in the last 168 hours. CBC:  Recent Labs Lab 04/28/14 1745 04/29/14 0605 04/30/14 0534 05/01/14 0522 05/02/14 0525  WBC 5.5 9.6 16.2* 10.6* 11.1*  NEUTROABS 4.5 8.5* 15.1* 9.8* 9.8*  HGB 14.4 12.9 12.3 11.1* 10.7*  HCT 43.6 39.2 37.6 33.9* 33.2*  MCV 91.8 91.2 92.6 91.1 91.0  PLT 255 238 220 210 224   Cardiac Enzymes: No results for input(s): CKTOTAL, CKMB, CKMBINDEX, TROPONINI in the last 168 hours. BNP (last 3 results) No results for input(s): BNP in the last 8760 hours.  ProBNP (last 3 results) No results for input(s): PROBNP in the last 8760 hours.  CBG:  Recent Labs Lab 05/01/14 0653 05/01/14 1135 05/01/14 1818 05/02/14 0616 05/02/14 1208  GLUCAP 157* 127* 126* 113* 115*    No  results found for this or any previous visit (from the past 240 hour(s)).   Studies: Dg Abd 2 Views  05/02/2014   ADDENDUM REPORT: 05/02/2014 09:18  ADDENDUM: Results discussed with Dr. Wyline Copas 05/02/2014 at 9:12 a.m.   Electronically Signed   By: Genia Del M.D.   On: 05/02/2014 09:18   05/02/2014   CLINICAL DATA:  74 year old female with recent CT diagnosis of diverticulitis. Subsequent encounter.  EXAM: ABDOMEN - 2 VIEW  COMPARISON:  04/30/2014 and 04/29/2014 abdominal plain films. 04/28/2014 pelvic CT. 04/28/2014 chest x-ray  FINDINGS: When compared to the prior chest x-ray, patient has now developed free intraperitoneal air below the right hemidiaphragm. On the prior CT, extra luminal gas was noted surrounding  abnormal appearing sigmoid colon. The progression of free intraperitoneal air may represent gas extending from the perforated sigmoid diverticula. If further delineation is clinically desired, CT imaging may be considered.  Gas filled small and large bowel. The small bowel loops appear dilated with differential air-fluid levels. This may reflect result of partial small bowel obstruction as versus ileus from a sigmoid colon abnormality.  Bibasilar consolidation suggestive of atelectasis for which may be related to splinting.  IMPRESSION: Increase amount of free intraperitoneal air as detailed above.  Abnormal bowel gas pattern may reflect partial small bowel obstruction as versus ileus.  Right lateral basilar consolidation.  These results will be called to the ordering clinician or representative by the Radiologist Assistant, and communication documented in the PACS or zVision Dashboard.  Electronically Signed: By: Genia Del M.D. On: 05/02/2014 08:49    Scheduled Meds: . lip balm  1 application Topical BID  . piperacillin-tazobactam (ZOSYN)  IV  3.375 g Intravenous 3 times per day  . saccharomyces boulardii  250 mg Oral BID   Continuous Infusions: . 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 If 7PM-7AM, please contact night-coverage at [email or web address], password [facility] 05/02/2014, 4:12 PM  LOS: 4 days

## 2014-05-03 ENCOUNTER — Encounter (HOSPITAL_COMMUNITY): Payer: Self-pay | Admitting: Certified Registered"

## 2014-05-03 ENCOUNTER — Inpatient Hospital Stay (HOSPITAL_COMMUNITY): Payer: Medicare Other | Admitting: Certified Registered"

## 2014-05-03 ENCOUNTER — Encounter (HOSPITAL_COMMUNITY): Admission: EM | Disposition: A | Discharge status: Home Health | Payer: Self-pay | Source: Home / Self Care

## 2014-05-03 DIAGNOSIS — Z5329 Procedure and treatment not carried out because of patient's decision for other reasons: Secondary | ICD-10-CM

## 2014-05-03 DIAGNOSIS — K5732 Diverticulitis of large intestine without perforation or abscess without bleeding: Secondary | ICD-10-CM | POA: Diagnosis present

## 2014-05-03 HISTORY — PX: LAPAROTOMY: SHX154

## 2014-05-03 HISTORY — PX: LAPAROSCOPIC PARTIAL COLECTOMY: SHX5907

## 2014-05-03 LAB — COMPREHENSIVE METABOLIC PANEL
ALT: 10 U/L (ref 0–35)
AST: 15 U/L (ref 0–37)
Albumin: 2.2 g/dL — ABNORMAL LOW (ref 3.5–5.2)
Alkaline Phosphatase: 59 U/L (ref 39–117)
Anion gap: 5 (ref 5–15)
BUN: 14 mg/dL (ref 6–23)
CALCIUM: 8.3 mg/dL — AB (ref 8.4–10.5)
CO2: 22 mmol/L (ref 19–32)
CREATININE: 0.92 mg/dL (ref 0.50–1.10)
Chloride: 110 mmol/L (ref 96–112)
GFR calc non Af Amer: 60 mL/min — ABNORMAL LOW (ref >90)
GFR, EST AFRICAN AMERICAN: 70 mL/min — AB (ref >90)
Glucose, Bld: 113 mg/dL — ABNORMAL HIGH (ref 70–99)
Potassium: 4.1 mmol/L (ref 3.5–5.1)
Sodium: 137 mmol/L (ref 135–145)
TOTAL PROTEIN: 5.8 g/dL — AB (ref 6.0–8.3)
Total Bilirubin: 1.3 mg/dL — ABNORMAL HIGH (ref 0.3–1.2)

## 2014-05-03 LAB — CBC
HEMATOCRIT: 33.4 % — AB (ref 36.0–46.0)
HEMOGLOBIN: 10.8 g/dL — AB (ref 12.0–15.0)
MCH: 29.2 pg (ref 26.0–34.0)
MCHC: 32.3 g/dL (ref 30.0–36.0)
MCV: 90.3 fL (ref 78.0–100.0)
Platelets: 260 10*3/uL (ref 150–400)
RBC: 3.7 MIL/uL — AB (ref 3.87–5.11)
RDW: 14.2 % (ref 11.5–15.5)
WBC: 8.6 10*3/uL (ref 4.0–10.5)

## 2014-05-03 LAB — TYPE AND SCREEN
ABO/RH(D): A POS
Antibody Screen: NEGATIVE

## 2014-05-03 LAB — PROTIME-INR
INR: 1.14 (ref 0.00–1.49)
Prothrombin Time: 14.7 seconds (ref 11.6–15.2)

## 2014-05-03 LAB — APTT: APTT: 30 s (ref 24–37)

## 2014-05-03 LAB — PREALBUMIN: Prealbumin: 6 mg/dL — ABNORMAL LOW (ref 17.0–34.0)

## 2014-05-03 LAB — GLUCOSE, CAPILLARY
Glucose-Capillary: 110 mg/dL — ABNORMAL HIGH (ref 70–99)
Glucose-Capillary: 128 mg/dL — ABNORMAL HIGH (ref 70–99)

## 2014-05-03 LAB — SURGICAL PCR SCREEN
MRSA, PCR: NEGATIVE
Staphylococcus aureus: NEGATIVE

## 2014-05-03 LAB — ABO/RH: ABO/RH(D): A POS

## 2014-05-03 SURGERY — LAPAROSCOPIC PARTIAL COLECTOMY
Anesthesia: General | Site: Abdomen

## 2014-05-03 MED ORDER — DEXAMETHASONE SODIUM PHOSPHATE 10 MG/ML IJ SOLN
INTRAMUSCULAR | Status: AC
  Filled 2014-05-03: qty 1

## 2014-05-03 MED ORDER — PROPOFOL 10 MG/ML IV BOLUS
INTRAVENOUS | Status: DC | PRN
  Administered 2014-05-03: 180 mg via INTRAVENOUS

## 2014-05-03 MED ORDER — 0.9 % SODIUM CHLORIDE (POUR BTL) OPTIME
TOPICAL | Status: DC | PRN
  Administered 2014-05-03: 7000 mL

## 2014-05-03 MED ORDER — ONDANSETRON HCL 4 MG PO TABS
4.0000 mg | ORAL_TABLET | Freq: Four times a day (QID) | ORAL | Status: DC | PRN
Start: 2014-05-03 — End: 2014-05-11

## 2014-05-03 MED ORDER — SUCCINYLCHOLINE CHLORIDE 20 MG/ML IJ SOLN
INTRAMUSCULAR | Status: DC | PRN
  Administered 2014-05-03: 100 mg via INTRAVENOUS

## 2014-05-03 MED ORDER — ROCURONIUM BROMIDE 100 MG/10ML IV SOLN
INTRAVENOUS | Status: DC | PRN
  Administered 2014-05-03: 5 mg via INTRAVENOUS
  Administered 2014-05-03: 25 mg via INTRAVENOUS
  Administered 2014-05-03: 10 mg via INTRAVENOUS
  Administered 2014-05-03: 5 mg via INTRAVENOUS

## 2014-05-03 MED ORDER — HEPARIN SODIUM (PORCINE) 5000 UNIT/ML IJ SOLN
5000.0000 [IU] | Freq: Three times a day (TID) | INTRAMUSCULAR | Status: DC
  Administered 2014-05-03 – 2014-05-11 (×23): 5000 [IU] via SUBCUTANEOUS
  Filled 2014-05-03 (×27): qty 1

## 2014-05-03 MED ORDER — FENTANYL CITRATE 0.05 MG/ML IJ SOLN
INTRAMUSCULAR | Status: DC | PRN
  Administered 2014-05-03 (×5): 50 ug via INTRAVENOUS

## 2014-05-03 MED ORDER — LACTATED RINGERS IR SOLN
Status: DC | PRN
  Administered 2014-05-03: 1000 mL

## 2014-05-03 MED ORDER — LACTATED RINGERS IV SOLN
INTRAVENOUS | Status: DC
  Administered 2014-05-03: 1000 mL via INTRAVENOUS
  Administered 2014-05-03: 1 via INTRAVENOUS
  Administered 2014-05-03: 11:00:00 via INTRAVENOUS

## 2014-05-03 MED ORDER — MIDAZOLAM HCL 2 MG/2ML IJ SOLN
INTRAMUSCULAR | Status: AC
  Filled 2014-05-03: qty 2

## 2014-05-03 MED ORDER — PROPOFOL 10 MG/ML IV BOLUS
INTRAVENOUS | Status: AC
  Filled 2014-05-03: qty 20

## 2014-05-03 MED ORDER — PANTOPRAZOLE SODIUM 40 MG IV SOLR
40.0000 mg | Freq: Every day | INTRAVENOUS | Status: DC
  Administered 2014-05-03 – 2014-05-10 (×8): 40 mg via INTRAVENOUS
  Filled 2014-05-03 (×9): qty 40

## 2014-05-03 MED ORDER — NEOSTIGMINE METHYLSULFATE 10 MG/10ML IV SOLN
INTRAVENOUS | Status: DC | PRN
  Administered 2014-05-03: 5 mg via INTRAVENOUS

## 2014-05-03 MED ORDER — DIPHENHYDRAMINE HCL 50 MG/ML IJ SOLN: 12.5000 mg | Freq: Four times a day (QID) | INTRAMUSCULAR | Status: DC | PRN

## 2014-05-03 MED ORDER — BUPIVACAINE HCL (PF) 0.5 % IJ SOLN
INTRAMUSCULAR | Status: AC
  Filled 2014-05-03: qty 30

## 2014-05-03 MED ORDER — MIDAZOLAM HCL 5 MG/5ML IJ SOLN
INTRAMUSCULAR | Status: DC | PRN
  Administered 2014-05-03: 2 mg via INTRAVENOUS

## 2014-05-03 MED ORDER — NEOSTIGMINE METHYLSULFATE 10 MG/10ML IV SOLN
INTRAVENOUS | Status: AC
  Filled 2014-05-03: qty 1

## 2014-05-03 MED ORDER — FENTANYL CITRATE 0.05 MG/ML IJ SOLN
INTRAMUSCULAR | Status: AC
  Filled 2014-05-03: qty 5

## 2014-05-03 MED ORDER — ONDANSETRON HCL 4 MG/2ML IJ SOLN
INTRAMUSCULAR | Status: DC | PRN
  Administered 2014-05-03: 4 mg via INTRAVENOUS

## 2014-05-03 MED ORDER — HYDROMORPHONE HCL 1 MG/ML IJ SOLN
INTRAMUSCULAR | Status: AC
  Filled 2014-05-03: qty 1

## 2014-05-03 MED ORDER — LIDOCAINE HCL (CARDIAC) 20 MG/ML IV SOLN
INTRAVENOUS | Status: AC
  Filled 2014-05-03: qty 5

## 2014-05-03 MED ORDER — ROCURONIUM BROMIDE 100 MG/10ML IV SOLN
INTRAVENOUS | Status: AC
  Filled 2014-05-03: qty 1

## 2014-05-03 MED ORDER — GLYCOPYRROLATE 0.2 MG/ML IJ SOLN
INTRAMUSCULAR | Status: DC | PRN
  Administered 2014-05-03: 0.6 mg via INTRAVENOUS

## 2014-05-03 MED ORDER — SODIUM CHLORIDE 0.9 % IJ SOLN: 9.0000 mL | INTRAMUSCULAR | Status: DC | PRN

## 2014-05-03 MED ORDER — KCL IN DEXTROSE-NACL 20-5-0.9 MEQ/L-%-% IV SOLN
INTRAVENOUS | Status: DC
  Administered 2014-05-03: 16:00:00 via INTRAVENOUS
  Filled 2014-05-03 (×3): qty 1000

## 2014-05-03 MED ORDER — NALOXONE HCL 0.4 MG/ML IJ SOLN: 0.4000 mg | INTRAMUSCULAR | Status: DC | PRN

## 2014-05-03 MED ORDER — HYDROMORPHONE HCL 1 MG/ML IJ SOLN
0.2500 mg | INTRAMUSCULAR | Status: DC | PRN
  Administered 2014-05-03 (×2): 0.5 mg via INTRAVENOUS

## 2014-05-03 MED ORDER — PIPERACILLIN-TAZOBACTAM 3.375 G IVPB
3.3750 g | Freq: Three times a day (TID) | INTRAVENOUS | Status: DC
  Administered 2014-05-03 – 2014-05-11 (×24): 3.375 g via INTRAVENOUS
  Filled 2014-05-03 (×24): qty 50

## 2014-05-03 MED ORDER — BUPIVACAINE HCL (PF) 0.5 % IJ SOLN
INTRAMUSCULAR | Status: DC | PRN
  Administered 2014-05-03: 9 mL

## 2014-05-03 MED ORDER — GLYCOPYRROLATE 0.2 MG/ML IJ SOLN
INTRAMUSCULAR | Status: AC
  Filled 2014-05-03: qty 3

## 2014-05-03 MED ORDER — DIPHENHYDRAMINE HCL 12.5 MG/5ML PO ELIX: 12.5000 mg | ORAL_SOLUTION | Freq: Four times a day (QID) | ORAL | Status: DC | PRN

## 2014-05-03 MED ORDER — ONDANSETRON HCL 4 MG/2ML IJ SOLN: 4.0000 mg | INTRAMUSCULAR | Status: DC | PRN

## 2014-05-03 MED ORDER — DEXAMETHASONE SODIUM PHOSPHATE 10 MG/ML IJ SOLN
INTRAMUSCULAR | Status: DC | PRN
  Administered 2014-05-03: 10 mg via INTRAVENOUS

## 2014-05-03 MED ORDER — ONDANSETRON HCL 4 MG/2ML IJ SOLN: 4.0000 mg | Freq: Four times a day (QID) | INTRAMUSCULAR | Status: DC | PRN

## 2014-05-03 MED ORDER — MORPHINE SULFATE (PF) 1 MG/ML IV SOLN
INTRAVENOUS | Status: AC
  Filled 2014-05-03: qty 25

## 2014-05-03 MED ORDER — MORPHINE SULFATE (PF) 1 MG/ML IV SOLN
INTRAVENOUS | Status: DC
  Administered 2014-05-03: 1 mg via INTRAVENOUS
  Administered 2014-05-03: 4.5 mg via INTRAVENOUS
  Administered 2014-05-04: 10:00:00 via INTRAVENOUS
  Administered 2014-05-04: 3 mg via INTRAVENOUS
  Administered 2014-05-04: 7.5 mg via INTRAVENOUS
  Administered 2014-05-04: 9 mg via INTRAVENOUS
  Administered 2014-05-04: 10.5 mg via INTRAVENOUS
  Administered 2014-05-04: 15 mg via INTRAVENOUS
  Administered 2014-05-04: 10.5 mg via INTRAVENOUS
  Administered 2014-05-04: 3.63 mg via INTRAVENOUS
  Administered 2014-05-05: 5.3 mg via INTRAVENOUS
  Administered 2014-05-05: 10.3 mg via INTRAVENOUS
  Administered 2014-05-05: 13.5 mg via INTRAVENOUS
  Administered 2014-05-06: 3 mg via INTRAVENOUS
  Administered 2014-05-06: 2.1 mg via INTRAVENOUS
  Administered 2014-05-06: 4.5 mg via INTRAVENOUS
  Administered 2014-05-06: 7.5 mg via INTRAVENOUS
  Administered 2014-05-07: 1.5 mg via INTRAVENOUS
  Administered 2014-05-07 (×2): 6 mg via INTRAVENOUS
  Administered 2014-05-07: 25 mg via INTRAVENOUS
  Administered 2014-05-07: 26 mg via INTRAVENOUS
  Administered 2014-05-07: 0.5 mg via INTRAVENOUS
  Administered 2014-05-08: 3 mg via INTRAVENOUS
  Administered 2014-05-08: 10:00:00 via INTRAVENOUS
  Administered 2014-05-08: 6 mg via INTRAVENOUS
  Administered 2014-05-08 – 2014-05-09 (×2): 4 mg via INTRAVENOUS
  Administered 2014-05-09: 1.5 mg via INTRAVENOUS
  Filled 2014-05-03 (×7): qty 25

## 2014-05-03 MED ORDER — ONDANSETRON HCL 4 MG/2ML IJ SOLN
INTRAMUSCULAR | Status: AC
  Filled 2014-05-03: qty 2

## 2014-05-03 MED ORDER — LIDOCAINE HCL (CARDIAC) 20 MG/ML IV SOLN
INTRAVENOUS | Status: DC | PRN
  Administered 2014-05-03: 50 mg via INTRAVENOUS

## 2014-05-03 MED ORDER — PROMETHAZINE HCL 25 MG/ML IJ SOLN
6.2500 mg | INTRAMUSCULAR | Status: DC | PRN
Start: 2014-05-03 — End: 2014-05-06

## 2014-05-03 SURGICAL SUPPLY — 70 items
APPLIER CLIP 5 13 M/L LIGAMAX5 (MISCELLANEOUS)
APPLIER CLIP ROT 10 11.4 M/L (STAPLE)
BAG UROSTOMY 4 LOOP 90 STRL (OSTOMY) ×2 IMPLANT
BLADE EXTENDED COATED 6.5IN (ELECTRODE) IMPLANT
BLADE HEX COATED 2.75 (ELECTRODE) ×2 IMPLANT
BNDG GAUZE ELAST 4 BULKY (GAUZE/BANDAGES/DRESSINGS) ×2 IMPLANT
CABLE HIGH FREQUENCY MONO STRZ (ELECTRODE) ×2 IMPLANT
CELLS DAT CNTRL 66122 CELL SVR (MISCELLANEOUS) IMPLANT
CLIP APPLIE 5 13 M/L LIGAMAX5 (MISCELLANEOUS) IMPLANT
CLIP APPLIE ROT 10 11.4 M/L (STAPLE) IMPLANT
COUNTER NEEDLE 20 DBL MAG RED (NEEDLE) ×2 IMPLANT
DECANTER SPIKE VIAL GLASS SM (MISCELLANEOUS) ×2 IMPLANT
DISSECTOR BLUNT TIP ENDO 5MM (MISCELLANEOUS) IMPLANT
DRAIN CHANNEL 19F RND (DRAIN) ×2 IMPLANT
DRAPE LAPAROSCOPIC ABDOMINAL (DRAPES) ×2 IMPLANT
DRAPE UTILITY XL STRL (DRAPES) ×2 IMPLANT
DRSG OPSITE POSTOP 4X10 (GAUZE/BANDAGES/DRESSINGS) IMPLANT
DRSG OPSITE POSTOP 4X6 (GAUZE/BANDAGES/DRESSINGS) IMPLANT
DRSG OPSITE POSTOP 4X8 (GAUZE/BANDAGES/DRESSINGS) IMPLANT
DRSG PAD ABDOMINAL 8X10 ST (GAUZE/BANDAGES/DRESSINGS) ×2 IMPLANT
ELECT REM PT RETURN 9FT ADLT (ELECTROSURGICAL) ×2
ELECTRODE REM PT RTRN 9FT ADLT (ELECTROSURGICAL) ×1 IMPLANT
EVACUATOR SILICONE 100CC (DRAIN) ×2 IMPLANT
FILTER SMOKE EVAC LAPAROSHD (FILTER) IMPLANT
GAUZE SPONGE 2X2 8PLY STRL LF (GAUZE/BANDAGES/DRESSINGS) ×1 IMPLANT
GAUZE SPONGE 4X4 12PLY STRL (GAUZE/BANDAGES/DRESSINGS) ×2 IMPLANT
GLOVE ECLIPSE 8.0 STRL XLNG CF (GLOVE) ×4 IMPLANT
GLOVE INDICATOR 8.0 STRL GRN (GLOVE) ×4 IMPLANT
GOWN STRL REUS W/TWL XL LVL3 (GOWN DISPOSABLE) ×4 IMPLANT
LEGGING LITHOTOMY PAIR STRL (DRAPES) IMPLANT
LIGASURE IMPACT 36 18CM CVD LR (INSTRUMENTS) ×2 IMPLANT
MANIFOLD NEPTUNE II (INSTRUMENTS) ×2 IMPLANT
PACK COLON (CUSTOM PROCEDURE TRAY) ×2 IMPLANT
RTRCTR WOUND ALEXIS 18CM MED (MISCELLANEOUS)
SCISSORS LAP 5X35 DISP (ENDOMECHANICALS) ×2 IMPLANT
SET IRRIG TUBING LAPAROSCOPIC (IRRIGATION / IRRIGATOR) ×2 IMPLANT
SHEARS HARMONIC ACE PLUS 36CM (ENDOMECHANICALS) IMPLANT
SHEARS HARMONIC ACE PLUS 45CM (MISCELLANEOUS) IMPLANT
SLEEVE XCEL OPT CAN 5 100 (ENDOMECHANICALS) ×8 IMPLANT
SOLUTION ANTI FOG 6CC (MISCELLANEOUS) ×2 IMPLANT
SPONGE DRAIN TRACH 4X4 STRL 2S (GAUZE/BANDAGES/DRESSINGS) ×2 IMPLANT
SPONGE GAUZE 2X2 STER 10/PKG (GAUZE/BANDAGES/DRESSINGS) ×1
SPONGE LAP 18X18 X RAY DECT (DISPOSABLE) ×6 IMPLANT
STAPLER PROXIMATE 75MM BLUE (STAPLE) ×4 IMPLANT
STAPLER VISISTAT 35W (STAPLE) ×2 IMPLANT
SUCTION POOLE TIP (SUCTIONS) ×2 IMPLANT
SUT ETHILON 3 0 PS 1 (SUTURE) ×2 IMPLANT
SUT PDS AB 1 CTX 36 (SUTURE) IMPLANT
SUT PDS AB 1 TP1 96 (SUTURE) ×4 IMPLANT
SUT PROLENE 2 0 KS (SUTURE) IMPLANT
SUT PROLENE 2 0 SH DA (SUTURE) ×4 IMPLANT
SUT SILK 2 0 (SUTURE) ×1
SUT SILK 2 0 SH CR/8 (SUTURE) ×2 IMPLANT
SUT SILK 2-0 18XBRD TIE 12 (SUTURE) ×1 IMPLANT
SUT SILK 3 0 (SUTURE) ×1
SUT SILK 3 0 SH CR/8 (SUTURE) ×4 IMPLANT
SUT SILK 3-0 18XBRD TIE 12 (SUTURE) ×1 IMPLANT
SUT VIC AB 2-0 SH 18 (SUTURE) ×2 IMPLANT
SUT VIC AB 3-0 SH 18 (SUTURE) ×4 IMPLANT
SUT VICRYL 2 0 18  UND BR (SUTURE) ×1
SUT VICRYL 2 0 18 UND BR (SUTURE) ×1 IMPLANT
SYS LAPSCP GELPORT 120MM (MISCELLANEOUS) ×2
SYSTEM LAPSCP GELPORT 120MM (MISCELLANEOUS) ×1 IMPLANT
TOWEL OR 17X26 10 PK STRL BLUE (TOWEL DISPOSABLE) IMPLANT
TOWEL OR NON WOVEN STRL DISP B (DISPOSABLE) ×4 IMPLANT
TRAY FOLEY CATH 14FRSI W/METER (CATHETERS) ×2 IMPLANT
TROCAR BLADELESS OPT 5 100 (ENDOMECHANICALS) ×2 IMPLANT
TROCAR XCEL BLUNT TIP 100MML (ENDOMECHANICALS) IMPLANT
TROCAR XCEL NON-BLD 11X100MML (ENDOMECHANICALS) IMPLANT
TUBING INSUFFLATION 10FT LAP (TUBING) ×2 IMPLANT

## 2014-05-03 NOTE — Progress Notes (Addendum)
Passing some dark stool. WBC down to normal. Colostomy site marked.  To OR today.  There is a small chance that aggressive lavage and appropriate drainage to be able to be done rather than the partial colectomy and colostomy.

## 2014-05-03 NOTE — Anesthesia Preprocedure Evaluation (Signed)
Anesthesia Evaluation  Patient identified by MRN, date of birth, ID band Patient awake    Reviewed: Allergy & Precautions, NPO status , Patient's Chart, lab work & pertinent test results  Airway Mallampati: II  TM Distance: >3 FB Neck ROM: Full    Dental no notable dental hx.    Pulmonary neg pulmonary ROS,  breath sounds clear to auscultation  Pulmonary exam normal       Cardiovascular Exercise Tolerance: Good Rhythm:Regular Rate:Normal     Neuro/Psych  Headaches, Anxiety    GI/Hepatic Neg liver ROS, GERD-  ,  Endo/Other  negative endocrine ROS  Renal/GU Renal disease  negative genitourinary   Musculoskeletal  (+) Arthritis -,   Abdominal   Peds negative pediatric ROS (+)  Hematology negative hematology ROS (+)   Anesthesia Other Findings   Reproductive/Obstetrics negative OB ROS                             Anesthesia Physical Anesthesia Plan  ASA: II  Anesthesia Plan: General   Post-op Pain Management:    Induction: Intravenous  Airway Management Planned: Oral ETT  Additional Equipment:   Intra-op Plan:   Post-operative Plan: Extubation in OR  Informed Consent: I have reviewed the patients History and Physical, chart, labs and discussed the procedure including the risks, benefits and alternatives for the proposed anesthesia with the patient or authorized representative who has indicated his/her understanding and acceptance.   Dental advisory given  Plan Discussed with: CRNA  Anesthesia Plan Comments:         Anesthesia Quick Evaluation

## 2014-05-03 NOTE — Anesthesia Postprocedure Evaluation (Signed)
  Anesthesia Post-op Note  Patient: Kristen Castillo  Procedure(s) Performed: Procedure(s) (LRB): DIAGNOSTIC LAPAROSCOPY WITH DRAINAGE OF INTRA ABDOMINAL ABSCESS PARTIAL COLECTOMY  (N/A) EXPLORATORY LAPAROTOMY WITH HARTMAN PROCEDURE (N/A)  Patient Location: PACU  Anesthesia Type: General  Level of Consciousness: awake and alert   Airway and Oxygen Therapy: Patient Spontanous Breathing  Post-op Pain: mild  Post-op Assessment: Post-op Vital signs reviewed, Patient's Cardiovascular Status Stable, Respiratory Function Stable, Patent Airway and No signs of Nausea or vomiting  Last Vitals:  Filed Vitals:   05/03/14 1620  BP: 122/52  Pulse: 76  Temp: 36.8 C  Resp: 18    Post-op Vital Signs: stable   Complications: No apparent anesthesia complications

## 2014-05-03 NOTE — Op Note (Signed)
Operative Note  Kristen Castillo female 74 y.o. 05/03/2014  PREOPERATIVE DX:  Perforated diverticulitis with multiple intra-abdominal abscesses  POSTOPERATIVE DX:  Same  PROCEDURE:   Diagnostic laparoscopy and drainage of right pelvic and left lower quadrant abscess. Exploratory laparotomy, sigmoid colectomy, drainage of left pelvic abscess, descending colostomy.         Surgeon: [name]   Assistants: Gwenith Spitz, PA-S  Anesthesia: General endotracheal anesthesia  Indications:   This is a 74 year old female who came in with complex acute diverticulitis. Medical therapy has been attempted but she has failed medical management and actually there is been progression of the disease with development of multiple intra-abdominal abscess. She now presents the operating room for the above procedure.    Procedure Detail:  She was brought to the operative room placed supine on the operating table and general anesthetic was given. A Foley catheter was inserted. A nasogastric tube was inserted. The abdominal wall was widely sterilely prepped and draped.  She's placed in slight reverse Trendelenburg position. A 5 mm incision was made in the left upper quadrant. Using a 5 mm Optiview trocar and 5 mm laparoscope axis was gained to the peritoneal cavity. Pneumoperitoneum was created. Inspection of the area deep to the trocar demonstrated no evidence of bleeding or organ injury. A 5 mm trocar was then placed in the right lower quadrant. A 5 mm trocar was placed in the supraumbilical area. Using blunt dissection I began dissecting omental adhesions off the anterior abdominal wall. I directed this toward the left side and entered a pocket of feculent fluid consistent with feculent peritonitis. I also directed this down into the right pelvis and drained purulent fluid.  I continued to attempt to use blunt dissection down to the left pelvis but it was significantly adherent to this area. Following this  I then made a limited lower midline incision through all layers and into the peritoneal cavity. I broke up some loculations and drained the left pelvic abscess. The area of disease was significantly adherent to the sidewall. I extended my incision above the umbilicus for better exposure. The area of disease was noted. Just above the rectosigmoid junction soft normal colon was identified and this area was divided with a linear cutting stapler. The rectosigmoid stump was marked with 2 2-0 Prolene sutures. Later in the case it was then anchored to the anterior abdominal wall with a 2-0 Prolene suture.  The area of perforation was identified and was approximately 1 cm in size. There were small bowel adhesions to the area that were broken up with finger fracture technique. I identified normal and soft appearing descending colon proximal to the area of disease. I divided the mesentery close to the bowel with a LigaSure. Part of the small intestine was adhered to this area and a small serosal burn was noted. I went ahead and oversewed this in 2 layers with 3-0 silk suture. The lumen of the small bowel was adequate. There was not a complete enterotomy.  I then divided the descending colon sigmoid colon junction with the stapler. The distal end of the sigmoid colon was marked with a suture and it was handed off the field. The descending colon was immobilized by dividing lateral attachments to allow a colostomy be placed. A previous market been placed in the left abdominal wall for the colostomy by the enterostomal nurse. Phimosis, I copiously irrigated out the entire abdominal cavity and all quadrants with saline solution. The remaining trochars were removed. A  [place] drain was then placed through the right sided trocar and positioned in the pelvis. It was anchored to the skin with 3-0 nylon suture. The small bowel was run and small interloop abscesses were broken up.  A circular incision was made in the left lower  quadrant skin where the colostomy sided been marked in the subcutaneous tissues divided. A cruciate incision was then made in the anterior posterior fascia. The descending colon stump was then brought up through the cruciate incision. There was an anchored to the fascia in 4 quadrants with interrupted 2-0 Vicryl sutures.   The small bowel and omentum were then placed back into the abdominal cavity. The fascia of the midline wound was then closed with running double looped #1 PDS suture. The remaining left upper quadrant trocar was removed.  The subcutaneous tissues packed with saline moistened gauze. The colostomy was then lowered matured after removing the staple line with interrupted 3-0 Vicryl sutures in a rosebud fashion. A colostomy appliance was then applied. A dry dressing was applied over the midline wound. The left upper quadrant trocar site was closed with staples. Sterile dressing was placed here.  She tolerated the procedure well without any apparent complications and was taken to the recovery room in satisfactory condition.  Estimated Blood Loss:  400 ml         Drains: #19 round Blake drain placed into the pelvis                Specimens: sigmoid colon        Complications:  * No complications entered in OR log *         Disposition: PACU - hemodynamically stable.         Condition: stable

## 2014-05-03 NOTE — Transfer of Care (Signed)
Immediate Anesthesia Transfer of Care Note  Patient: Kristen Castillo  Procedure(s) Performed: Procedure(s) (LRB): DIAGNOSTIC LAPAROSCOPY WITH DRAINAGE OF INTRA ABDOMINAL ABSCESS PARTIAL COLECTOMY  (N/A) EXPLORATORY LAPAROTOMY WITH HARTMAN PROCEDURE (N/A)  Patient Location: PACU  Anesthesia Type: General  Level of Consciousness: sedated, patient cooperative and responds to stimulation  Airway & Oxygen Therapy: Patient Spontanous Breathing and Patient connected to face mask oxgen  Post-op Assessment: Report given to PACU RN and Post -op Vital signs reviewed and stable  Post vital signs: Reviewed and stable  Complications: No apparent anesthesia complications

## 2014-05-03 NOTE — Progress Notes (Signed)
[facility] PROGRESS NOTE  Kristen Castillo EXN:[id number] DOB: [birth year] DOA: 04/28/2014 PCP: Walker Kehr, MD  Assessment/Plan: 1. Acute sigmoid diverticulitis with perforation 1. Surgery following 2. Initial medical management alone with broad spectrum abx 3. Pt showed no real improvement, thus CT abd was ordered on 2/23, demonstrating increasing free air with multiple areas of fluid collection c/w abscess as large as 7cm in diameter 4. Pt underwent surgery on 2/24 without reported complications 5. Pt is continued on zosyn and probiotic 2. Dehydration 1. Cont hydration as tolerated 2. Improved 3. Migraines 1. Stable 2. None currently 4. Osteoarthritis 1. Stable 2. No active joint pains at present 5. DVT prophylaxis 1. SCD's  Code Status: Full Family Communication: Pt in room, family at bedside Disposition Plan: Pending   Consultants:  Surgery  Procedures:    Antibiotics:  Zosyn 2/18>>> (indicate start date, and stop date if known)  HPI/Subjective: Noted "wheezing" overnight without improvement with neb tx. Currently denies sob.  Objective: Filed Vitals:   05/03/14 1420 05/03/14 1520 05/03/14 1620 05/03/14 1720  BP: 124/62 129/60 122/52 129/59  Pulse: 81 85 76 82  Temp: 99.1 F (37.3 C) 98.6 F (37 C) 98.2 F (36.8 C) 98.8 F (37.1 C)  TempSrc:  Oral Oral Oral  Resp: 18 18 18 19   Height:      Weight:      SpO2: 96% 95% 95% 95%    Intake/Output Summary (Last 24 hours) at 05/03/14 1842 Last data filed at 05/03/14 1806  Gross per 24 hour  Intake 3942.08 ml  Output   1305 ml  Net 2637.08 ml   Filed Weights   04/29/14 1700  Weight: 52.164 kg (115 lb)    Exam:   General:  Awake, laying in bed, in nad  Cardiovascular: regular, s1, s2  Respiratory: normal resp effort, no wheezing on exam  Abdomen: soft, pos BS, nondistended  Musculoskeletal: perfused,no clubbing   Data Reviewed: Basic Metabolic Panel:  Recent Labs Lab  04/29/14 0605 04/30/14 0534 05/01/14 0522 05/02/14 0525 05/03/14 0600  NA 139 134* 136 137 137  K 4.2 4.4 4.7 4.6 4.1  CL 109 105 109 113* 110  CO2 23 22 19 21 22   GLUCOSE 143* 160* 172* 138* 113*  BUN 29* 31* 26* 18 14  CREATININE 0.90 1.17* 1.03 0.83 0.92  CALCIUM 7.9* 7.9* 7.7* 8.0* 8.3*   Liver Function Tests:  Recent Labs Lab 04/28/14 1745 04/29/14 0605 05/01/14 0522 05/03/14 0600  AST 20 21 16 15   ALT 15 12 9 10   ALKPHOS 68 55 58 59  BILITOT 0.7 1.1 1.0 1.3*  PROT 6.6 5.8* 5.7* 5.8*  ALBUMIN 3.4* 2.9* 2.2* 2.2*    Recent Labs Lab 04/28/14 1745  LIPASE 26   No results for input(s): AMMONIA in the last 168 hours. CBC:  Recent Labs Lab 04/28/14 1745 04/29/14 0605 04/30/14 0534 05/01/14 0522 05/02/14 0525 05/03/14 0600  WBC 5.5 9.6 16.2* 10.6* 11.1* 8.6  NEUTROABS 4.5 8.5* 15.1* 9.8* 9.8*  --   HGB 14.4 12.9 12.3 11.1* 10.7* 10.8*  HCT 43.6 39.2 37.6 33.9* 33.2* 33.4*  MCV 91.8 91.2 92.6 91.1 91.0 90.3  PLT 255 238 220 210 224 260   Cardiac Enzymes: No results for input(s): CKTOTAL, CKMB, CKMBINDEX, TROPONINI in the last 168 hours. BNP (last 3 results) No results for input(s): BNP in the last 8760 hours.  ProBNP (last 3 results) No results for input(s): PROBNP in the last 8760 hours.  CBG:  Recent  Labs Lab 05/02/14 7782 05/02/14 1208 05/02/14 1917 05/03/14 0007 05/03/14 0609  GLUCAP 113* 115* 122* 128* 110*    Recent Results (from the past 240 hour(s))  Surgical pcr screen     Status: None   Collection Time: 05/03/14  5:30 AM  Result Value Ref Range Status   MRSA, PCR NEGATIVE NEGATIVE Final   Staphylococcus aureus NEGATIVE NEGATIVE Final    Comment:        The Xpert SA Assay (FDA approved for NASAL specimens in patients over 57 years of age), is one component of a comprehensive surveillance program.  Test performance has been validated by [facility] for patients greater than or equal to 65 year old. It is not intended to  diagnose infection nor to guide or monitor treatment.      Studies: Ct Abdomen Pelvis W Contrast  05/02/2014   CLINICAL DATA:  Diverticulitis with colon perforation.  EXAM: CT ABDOMEN AND PELVIS WITH CONTRAST  TECHNIQUE: Multidetector CT imaging of the abdomen and pelvis was performed using the standard protocol following bolus administration of intravenous contrast.  CONTRAST:  42mL OMNIPAQUE IOHEXOL 300 MG/ML  SOLN  COMPARISON:  04/28/2014  FINDINGS: Small bilateral pleural effusions with compressive atelectasis in the lower lobes. Heart is normal size.  There is increasing free air anterior to the liver and elsewhere anteriorly within the abdomen and pelvis. Multiple complex fluid collections are noted in the pelvis with enhancing rims compatible with developing abscesses. Air-fluid levels noted within these fluid collections. Large left pelvic fluid collection measures 6.5 x 6.3 cm. Fluid collection in the posterior right pelvis measures 6.9 x 5.1 cm. It is difficult to determine if these fluid collections communicate. Other fluid collections also noted within the lower abdomen on the left (7.2 x 3.6 cm on image 54) as well as a small fluid collection in the left posterior pelvis on image 78.  Continued inflammatory stranding around the sigmoid colon with diffuse diverticulosis. No evidence of bowel obstruction. Small low-density area centrally within the liver compatible with cyst. Calcifications in the spleen compatible with old granulomatous disease. Small amount of fluid around the gallbladder without definite gallstones or wall thickening. Pancreas, adrenals are unremarkable. Bilateral renal cysts are noted, both cortical and parapelvic. No hydronephrosis.  Aorta and iliac vessels are normal caliber. No acute bony abnormality. 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  IMPRESSION: Increasing free air within the abdomen and pelvis. Organizing fluid collections throughout the pelvis  and lower abdomen compatible with developing abscesses containing air-fluid levels. Continued inflammatory stranding around the sigmoid colon compatible with diverticulitis.  New small bilateral pleural effusions with bibasilar compressive atelectasis.  Dr. Erlinda Hong was paged at 3:49 p.m. to discuss this case. However, the page was not returned. We will continue to attempt tip age Dr. Erlinda Hong regarding this case.   Electronically Signed   By: Rolm Baptise M.D.   On: 05/02/2014 16:18   Dg Abd 2 Views  05/02/2014   ADDENDUM REPORT: 05/02/2014 09:18  ADDENDUM: Results discussed with Dr. Wyline Copas 05/02/2014 at 9:12 a.m.   Electronically Signed   By: Genia Del M.D.   On: 05/02/2014 09:18   05/02/2014   CLINICAL DATA:  74 year old female with recent CT diagnosis of diverticulitis. Subsequent encounter.  EXAM: ABDOMEN - 2 VIEW  COMPARISON:  04/30/2014 and 04/29/2014 abdominal plain films. 04/28/2014 pelvic CT. 04/28/2014 chest x-ray  FINDINGS: When compared to the prior chest x-ray, patient has now developed free intraperitoneal air below the right  hemidiaphragm. On the prior CT, extra luminal gas was noted surrounding abnormal appearing sigmoid colon. The progression of free intraperitoneal air may represent gas extending from the perforated sigmoid diverticula. If further delineation is clinically desired, CT imaging may be considered.  Gas filled small and large bowel. The small bowel loops appear dilated with differential air-fluid levels. This may reflect result of partial small bowel obstruction as versus ileus from a sigmoid colon abnormality.  Bibasilar consolidation suggestive of atelectasis for which may be related to splinting.  IMPRESSION: Increase amount of free intraperitoneal air as detailed above.  Abnormal bowel gas pattern may reflect partial small bowel obstruction as versus ileus.  Right lateral basilar consolidation.  These results will be called to the ordering clinician or representative by the Radiologist  Assistant, and communication documented in the PACS or zVision Dashboard.  Electronically Signed: By: Genia Del M.D. On: 05/02/2014 08:49    Scheduled Meds: . heparin subcutaneous  5,000 Units Subcutaneous 3 times per day  . HYDROmorphone      . morphine   Intravenous 6 times per day  . morphine      . pantoprazole (PROTONIX) IV  40 mg Intravenous QHS  . piperacillin-tazobactam (ZOSYN)  IV  3.375 g Intravenous Q8H   Continuous Infusions: . 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 If 7PM-7AM, please contact night-coverage at [email or web address], password [facility] 05/03/2014, 6:42 PM  LOS: 5 days

## 2014-05-03 NOTE — Consult Note (Signed)
WOC ostomy consult note Patient seen per request of Dr. [name] for preoperative stoma site selection and marking.  Patient's abdomen assessed in the sitting and standing positions.  When she is reclining, I am able to gently palpate the abdominal rectus muscle.  Patient has had two abdominal surgeries approximately 45 years ago and the incisions are faint scars in her suprapubic area.  Site selected for a left descending colon ostomy is 6cm to the left of the umbilicus and 1cm below in a flat plane within the abdominal rectus muscle. Site is marked with a surgical skin marking pen and covered with a thin film transparent dressing.  She wears her slacks just above this mark and there is a soft roll of adipose tissue just beneath it.  Patient understands that she may have an ostomy post procedure and that there is an ostomy nurse here to assist her in learning the care and management of an ostomy postoperatively should that occur.  Her husband is here and appears to be quite supportive.  Thank you for this consultation.  I will be glad to assist in the care of this patient in the event an ostomy is created intraoperatively. Please reconsult if that is the case. Maudie Flakes, MSN, RN, [place], [place], [place] [phone])

## 2014-05-04 ENCOUNTER — Encounter (HOSPITAL_COMMUNITY): Payer: Self-pay | Admitting: General Surgery

## 2014-05-04 ENCOUNTER — Telehealth: Payer: Self-pay | Admitting: Internal Medicine

## 2014-05-04 DIAGNOSIS — E860 Dehydration: Secondary | ICD-10-CM | POA: Insufficient documentation

## 2014-05-04 DIAGNOSIS — G43009 Migraine without aura, not intractable, without status migrainosus: Secondary | ICD-10-CM

## 2014-05-04 DIAGNOSIS — R109 Unspecified abdominal pain: Secondary | ICD-10-CM

## 2014-05-04 LAB — BASIC METABOLIC PANEL
Anion gap: 6 (ref 5–15)
BUN: 24 mg/dL — AB (ref 6–23)
CO2: 25 mmol/L (ref 19–32)
CREATININE: 1.05 mg/dL (ref 0.50–1.10)
Calcium: 8 mg/dL — ABNORMAL LOW (ref 8.4–10.5)
Chloride: 111 mmol/L (ref 96–112)
GFR, EST AFRICAN AMERICAN: 60 mL/min — AB (ref >90)
GFR, EST NON AFRICAN AMERICAN: 51 mL/min — AB (ref >90)
GLUCOSE: 177 mg/dL — AB (ref 70–99)
Potassium: 5.1 mmol/L (ref 3.5–5.1)
Sodium: 142 mmol/L (ref 135–145)

## 2014-05-04 LAB — CBC
HEMATOCRIT: 32 % — AB (ref 36.0–46.0)
HEMOGLOBIN: 10.4 g/dL — AB (ref 12.0–15.0)
MCH: 29.4 pg (ref 26.0–34.0)
MCHC: 32.5 g/dL (ref 30.0–36.0)
MCV: 90.4 fL (ref 78.0–100.0)
Platelets: 290 10*3/uL (ref 150–400)
RBC: 3.54 MIL/uL — ABNORMAL LOW (ref 3.87–5.11)
RDW: 14.2 % (ref 11.5–15.5)
WBC: 9.9 10*3/uL (ref 4.0–10.5)

## 2014-05-04 MED ORDER — SODIUM CHLORIDE 0.9 % IV BOLUS (SEPSIS)
500.0000 mL | Freq: Once | INTRAVENOUS | Status: AC
  Administered 2014-05-04: 500 mL via INTRAVENOUS

## 2014-05-04 MED ORDER — DEXTROSE IN LACTATED RINGERS 5 % IV SOLN
INTRAVENOUS | Status: AC
  Administered 2014-05-04 – 2014-05-05 (×2): via INTRAVENOUS

## 2014-05-04 MED ORDER — SODIUM CHLORIDE 0.9 % IJ SOLN
10.0000 mL | INTRAMUSCULAR | Status: DC | PRN
  Administered 2014-05-05 – 2014-05-11 (×7): 10 mL
  Filled 2014-05-04 (×7): qty 40

## 2014-05-04 NOTE — Telephone Encounter (Signed)
My chart - dine

## 2014-05-04 NOTE — Progress Notes (Signed)
1 Day Post-Op  Subjective: She states she is doing well and has no complaints. Her pain is well controlled and she does not feel well bloated or nauseous. She has not vomited. She endorses using incentive spirometer and sitting on the edge of the bed as tolerated.  Objective: Vital signs in last 24 hours: Temp:  [98.2 F (36.8 C)-99.2 F (37.3 C)] 98.7 F (37.1 C) (02/24 0600) Pulse Rate:  [67-92] 77 (02/24 0600) Resp:  [16-26] 24 (02/24 0600) BP: (105-135)/(43-62) 105/48 mmHg (02/24 0600) SpO2:  [92 %-99 %] 96 % (02/24 0600) FiO2 (%):  [29 %-34 %] 29 % (02/24 0423) Last BM Date: 05/03/14  Intake/Output from previous day: 02/23 0701 - 02/24 0700 In: 4654.6 [I.V.:4614.6] Out: 1710 [Urine:1200; Emesis/NG output:50; Drains:60; Blood:400] Intake/Output this shift:    PE: General- In NAD and in good spirits. Pulm: CTA b/l all fields Cardiac: RRR Abdomen- Distended abd with dressed open low mid-line incision. Absent bowel sounds, tender around incision sites. Serous output from drains.  Lab Results:   Recent Labs  05/03/14 0600 05/04/14 0553  WBC 8.6 9.9  HGB 10.8* 10.4*  HCT 33.4* 32.0*  PLT 260 290   BMET  Recent Labs  05/03/14 0600 05/04/14 0553  NA 137 142  K 4.1 5.1  CL 110 111  CO2 22 25  GLUCOSE 113* 177*  BUN 14 24*  CREATININE 0.92 1.05  CALCIUM 8.3* 8.0*   PT/INR  Recent Labs  05/03/14 0600  LABPROT 14.7  INR 1.14   Comprehensive Metabolic Panel:    Component Value Date/Time   NA 142 05/04/2014 0553   NA 137 05/03/2014 0600   K 5.1 05/04/2014 0553   K 4.1 05/03/2014 0600   CL 111 05/04/2014 0553   CL 110 05/03/2014 0600   CO2 25 05/04/2014 0553   CO2 22 05/03/2014 0600   BUN 24* 05/04/2014 0553   BUN 14 05/03/2014 0600   CREATININE 1.05 05/04/2014 0553   CREATININE 0.92 05/03/2014 0600   GLUCOSE 177* 05/04/2014 0553   GLUCOSE 113* 05/03/2014 0600   CALCIUM 8.0* 05/04/2014 0553   CALCIUM 8.3* 05/03/2014 0600   AST 15 05/03/2014  0600   AST 16 05/01/2014 0522   ALT 10 05/03/2014 0600   ALT 9 05/01/2014 0522   ALKPHOS 59 05/03/2014 0600   ALKPHOS 58 05/01/2014 0522   BILITOT 1.3* 05/03/2014 0600   BILITOT 1.0 05/01/2014 0522   PROT 5.8* 05/03/2014 0600   PROT 5.7* 05/01/2014 0522   ALBUMIN 2.2* 05/03/2014 0600   ALBUMIN 2.2* 05/01/2014 0522     Studies/Results: Ct Abdomen Pelvis W Contrast  05/02/2014   CLINICAL DATA:  Diverticulitis with colon perforation.  EXAM: CT ABDOMEN AND PELVIS WITH CONTRAST  TECHNIQUE: Multidetector CT imaging of the abdomen and pelvis was performed using the standard protocol following bolus administration of intravenous contrast.  CONTRAST:  10mL OMNIPAQUE IOHEXOL 300 MG/ML  SOLN  COMPARISON:  04/28/2014  FINDINGS: Small bilateral pleural effusions with compressive atelectasis in the lower lobes. Heart is normal size.  There is increasing free air anterior to the liver and elsewhere anteriorly within the abdomen and pelvis. Multiple complex fluid collections are noted in the pelvis with enhancing rims compatible with developing abscesses. Air-fluid levels noted within these fluid collections. Large left pelvic fluid collection measures 6.5 x 6.3 cm. Fluid collection in the posterior right pelvis measures 6.9 x 5.1 cm. It is difficult to determine if these fluid collections communicate. Other fluid collections also noted  within the lower abdomen on the left (7.2 x 3.6 cm on image 54) as well as a small fluid collection in the left posterior pelvis on image 78.  Continued inflammatory stranding around the sigmoid colon with diffuse diverticulosis. No evidence of bowel obstruction. Small low-density area centrally within the liver compatible with cyst. Calcifications in the spleen compatible with old granulomatous disease. Small amount of fluid around the gallbladder without definite gallstones or wall thickening. Pancreas, adrenals are unremarkable. Bilateral renal cysts are noted, both cortical and  parapelvic. No hydronephrosis.  Aorta and iliac vessels are normal caliber. No acute bony abnormality. 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  IMPRESSION: Increasing free air within the abdomen and pelvis. Organizing fluid collections throughout the pelvis and lower abdomen compatible with developing abscesses containing air-fluid levels. Continued inflammatory stranding around the sigmoid colon compatible with diverticulitis.  New small bilateral pleural effusions with bibasilar compressive atelectasis.  Dr. Erlinda Hong was paged at 3:49 p.m. to discuss this case. However, the page was not returned. We will continue to attempt tip age Dr. Erlinda Hong regarding this case.   Electronically Signed   By: Rolm Baptise M.D.   On: 05/02/2014 16:18   Dg Abd 2 Views  05/02/2014   ADDENDUM REPORT: 05/02/2014 09:18  ADDENDUM: Results discussed with Dr. Wyline Copas 05/02/2014 at 9:12 a.m.   Electronically Signed   By: Genia Del M.D.   On: 05/02/2014 09:18   05/02/2014   CLINICAL DATA:  74 year old female with recent CT diagnosis of diverticulitis. Subsequent encounter.  EXAM: ABDOMEN - 2 VIEW  COMPARISON:  04/30/2014 and 04/29/2014 abdominal plain films. 04/28/2014 pelvic CT. 04/28/2014 chest x-ray  FINDINGS: When compared to the prior chest x-ray, patient has now developed free intraperitoneal air below the right hemidiaphragm. On the prior CT, extra luminal gas was noted surrounding abnormal appearing sigmoid colon. The progression of free intraperitoneal air may represent gas extending from the perforated sigmoid diverticula. If further delineation is clinically desired, CT imaging may be considered.  Gas filled small and large bowel. The small bowel loops appear dilated with differential air-fluid levels. This may reflect result of partial small bowel obstruction as versus ileus from a sigmoid colon abnormality.  Bibasilar consolidation suggestive of atelectasis for which may be related to splinting.  IMPRESSION:  Increase amount of free intraperitoneal air as detailed above.  Abnormal bowel gas pattern may reflect partial small bowel obstruction as versus ileus.  Right lateral basilar consolidation.  These results will be called to the ordering clinician or representative by the Radiologist Assistant, and communication documented in the PACS or zVision Dashboard.  Electronically Signed: By: Genia Del M.D. On: 05/02/2014 08:49    Anti-infectives: Anti-infectives    Start     Dose/Rate Route Frequency Ordered Stop   05/03/14 1600  piperacillin-tazobactam (ZOSYN) IVPB 3.375 g     3.375 g 12.5 mL/hr over 240 Minutes Intravenous Every 8 hours 05/03/14 1556     04/29/14 0400  piperacillin-tazobactam (ZOSYN) IVPB 3.375 g  Status:  Discontinued     3.375 g 12.5 mL/hr over 240 Minutes Intravenous 3 times per day 04/28/14 2103 04/28/14 2247   04/29/14 0400  piperacillin-tazobactam (ZOSYN) IVPB 3.375 g  Status:  Discontinued     3.375 g 12.5 mL/hr over 240 Minutes Intravenous 3 times per day 04/28/14 2250 05/03/14 1556   04/28/14 2100  metroNIDAZOLE (FLAGYL) IVPB 500 mg  Status:  Discontinued     500 mg 100 mL/hr over 60 Minutes Intravenous  Every 6 hours 04/28/14 2049 04/28/14 2053   04/28/14 2045  piperacillin-tazobactam (ZOSYN) IVPB 3.375 g     3.375 g 100 mL/hr over 30 Minutes Intravenous  Once 04/28/14 2043 04/28/14 2133      Assessment Principal Problem:   Diverticulitis of colon with perforation - Partial Sigmoid Colectomy with Proximal Colostomy Active Problems:   Anxiety, mild   Dehydration - Fluid resuscitation   Malnutrition - Prealbumin 6.0, PICC line insertion with TPN post fluid resuscitation    VTE prophylaxis - heparin     LOS: 6 days   Plan: Pt is POD 1 from partial sigmoid colectomy with proximal colostomy. Fluid resuscitation and PICC line insertion today for anticipated start of TPN tomorrow. Continue abx.    Kristen Castillo 05/04/2014

## 2014-05-04 NOTE — Progress Notes (Signed)
ANTIBIOTIC CONSULT NOTE - FOLLOW UP  Pharmacy Consult for Zosyn Indication: Intra-abdominal infection  Allergies  Allergen Reactions  . Codeine Nausea And Vomiting  . Sulfonamide Derivatives Other (See Comments)    Childhood allergy; reaction unknown    Patient Measurements: Height: 5\' 2"  (157.5 cm) (per pt) Weight: 115 lb (52.164 kg) (per pt) IBW/kg (Calculated) : 50.1   Vital Signs: Temp: 98.7 F (37.1 C) (02/24 0600) Temp Source: Oral (02/24 0600) BP: 105/48 mmHg (02/24 0600) Pulse Rate: 77 (02/24 0600) Intake/Output from previous day: 02/23 0701 - 02/24 0700 In: 4654.6 [I.V.:4614.6] Out: 1710 [Urine:1200; Emesis/NG output:50; Drains:60; Blood:400] Intake/Output from this shift: Total I/O In: 556.3 [I.V.:35.4; IV Piggyback:520.8] Out: 10 [Drains:10]  Labs:  Recent Labs  05/02/14 0525 05/03/14 0600 05/04/14 0553  WBC 11.1* 8.6 9.9  HGB 10.7* 10.8* 10.4*  PLT 224 260 290  CREATININE 0.83 0.92 1.05   Estimated Creatinine Clearance: 37.7 mL/min (by C-G formula based on Cr of 1.05).    Microbiology: 2/23: MRSA PCR screen: negative   Antimicrobials: 2/18 >> Zosyn >>  Assessment: 43 YOF admitted for diverticulitis w/ perforation on 2/18.Received zosyn 3.375 g x 1 in ED. Pharmacy was consulted to dose Zosyn for intra-abdominal infection.  Goal of Therapy:  Appropriate antibiotic dosing for indication and renal function; eradication of infection.   Significant Events: 2/23: Exp lap with sig colectomy, drainage of abscesses, creation of descending colostomy  Today, 05/04/2014 D#7 Zosyn 3.375 grams IV q8h (extended-infusion, each dose over 4 hrs) POD#1 Afebrile Leukocytosis resolved SCr with slight increase but still WNL No culture data  Plan: 1. Continue Zosyn 3.375 grams IV q8h (extended-infusion) 2. Follow renal function, any available culture results, clinical course. 3. Await word from surgery on anticipated duration of therapy.  Clayburn Pert, PharmD, BCPS Pager: [phone] 05/04/2014  10:54 AM

## 2014-05-04 NOTE — Progress Notes (Signed)
INITIAL NUTRITION ASSESSMENT  DOCUMENTATION CODES Per approved criteria  -Not Applicable   INTERVENTION: -TPN per pharmacy -Obtain weight  NUTRITION DIAGNOSIS: Inadequate oral intake related to inability to eat as evidenced by NPO status.   Goal: Pt to meet >/= 90% of estimated needs  Monitor:  Pt TPN tolerance, weight trends, labs  Reason for Assessment: Consult per MD  74 y.o. female  Admitting Dx: Diverticulitis of colon with perforation  ASSESSMENT: Pt with no significant past medical history presents with diverticulitis of colon with perforation.  Consulted for TPN.    Per weight records and patient weight has been stable. Pt reports not change in appetite.  Pt reports exercising 75 minutes everyday.    Nutrition Focus Physical Exam showed no signs of fat or muscle wasting.    Height: Ht Readings from Last 1 Encounters:  04/29/14 5\' 2"  (1.575 m)    Weight: Wt Readings from Last 1 Encounters:  04/29/14 115 lb (52.164 kg)    Ideal Body Weight: 110 lbs   % Ideal Body Weight: 105%  Wt Readings from Last 10 Encounters:  04/29/14 115 lb (52.164 kg)  03/21/14 115 lb (52.164 kg)  04/14/13 114 lb 6.4 oz (51.891 kg)  04/09/12 121 lb 3.2 oz (54.976 kg)  02/12/12 117 lb 1.3 oz (53.107 kg)  09/07/11 128 lb (58.06 kg)  01/08/11 122 lb (55.339 kg)  12/25/09 116 lb (52.617 kg)  08/21/09 116 lb (52.617 kg)  02/09/09 115 lb (52.164 kg)    Usual Body Weight: 115 lbs  % Usual Body Weight: 100%  BMI:  Body mass index is 21.03 kg/(m^2).  Estimated Nutritional Needs: Kcal: 1400-1600 kcal Protein: 65-75 g protein Fluid: >/1.5 L/day  Skin: Incision on abdomen  Diet Order: Diet NPO time specified Except for: Ice Chips  EDUCATION NEEDS: -No education needs identified at this time   Intake/Output Summary (Last 24 hours) at 05/04/14 1134 Last data filed at 05/04/14 1050  Gross per 24 hour  Intake 3210.83 ml  Output   1360 ml  Net 1850.83 ml    Last BM:  2/23   Labs:   Recent Labs Lab 05/02/14 0525 05/03/14 0600 05/04/14 0553  NA 137 137 142  K 4.6 4.1 5.1  CL 113* 110 111  CO2 21 22 25   BUN 18 14 24*  CREATININE 0.83 0.92 1.05  CALCIUM 8.0* 8.3* 8.0*  GLUCOSE 138* 113* 177*    CBG (last 3)   Recent Labs  05/02/14 1917 05/03/14 0007 05/03/14 0609  GLUCAP 122* 128* 110*    Scheduled Meds: . heparin subcutaneous  5,000 Units Subcutaneous 3 times per day  . morphine   Intravenous 6 times per day  . pantoprazole (PROTONIX) IV  40 mg Intravenous QHS  . piperacillin-tazobactam (ZOSYN)  IV  3.375 g Intravenous Q8H    Continuous Infusions: . dextrose 5% lactated ringers 125 mL/hr at 05/04/14 0233    Past Medical History  Diagnosis Date  . Hematuria   . Tremor   . Hair loss   . Unspecified vitamin D deficiency   . Migraines   . Osteoporosis   . Osteoarthritis   . Allergic rhinitis   . Glaucoma     Past Surgical History  Procedure Laterality Date  . Abdominal hysterectomy  1972  . Oophorectomy  1976  . Tonsillectomy    . Lipoma resection      left scapula area  . Reduction mammaplasty  1994  . Rhinoplasty  1985  . Facial  cosmetic surgery  2010    [name] MD in [place] [facility] Pager Number [phone]

## 2014-05-04 NOTE — Progress Notes (Signed)
Peripherally Inserted Central Catheter/Midline Placement  The IV Nurse has discussed with the patient and/or persons authorized to consent for the patient, the purpose of this procedure and the potential benefits and risks involved with this procedure.  The benefits include less needle sticks, lab draws from the catheter and patient may be discharged home with the catheter.  Risks include, but not limited to, infection, bleeding, blood clot (thrombus formation), and puncture of an artery; nerve damage and irregular heat beat.  Alternatives to this procedure were also discussed.  PICC/Midline Placement Documentation        Murvin Natal 05/04/2014, 10:48 AM

## 2014-05-04 NOTE — Progress Notes (Signed)
[facility] PROGRESS NOTE  Kristen Castillo SPQ:[id number] DOB: 1940-12-16 DOA: 04/28/2014 PCP: Walker Kehr, MD  Assessment/Plan: 1. Acute sigmoid diverticulitis with perforation 1. Surgery following 2. Initial medical management alone with broad spectrum abx 3. Pt showed no real improvement, thus CT abd was ordered on 2/23, demonstrating increasing free air with multiple areas of fluid collection c/w abscess as large as 7cm in diameter 4. Pt underwent surgery on 2/23 without reported complications 5. Pt is continued on zosyn and probiotic 6. Patient to be started on TNA, hopefully tomorrow once PICC line has been placed. General surgery following. 2. Dehydration 1. Cont hydration as tolerated 2. Improved 3. Migraines 1. Stable 2. None currently 4. Osteoarthritis 1. Stable 2. No active joint pains at present 5. DVT prophylaxis 1. SCD's  Code Status: Full Family Communication: Updated patient and husband at bedside. Disposition Plan: Pending   Consultants:  Surgery: Dr. Johney Maine to 18 2016  Procedures:  Diagnostic laparoscopy and drainage of right pelvic and left lower quadrant abscess area respiratory laparotomy, sigmoid colectomy, drainage of left pelvic abscess, descending colostomy. Per Dr. Zella Richer 05/03/2014  CT abdomen and pelvis 05/02/2014  Abdominal x-rays 05/02/2014, 04/30/2014, 04/29/2014  Chest x-ray 04/28/2014  Antibiotics:  Zosyn 2/18>>> (indicate start date, and stop date if known)  HPI/Subjective: Patient states she's been a little bit better. Patient denies any shortness of breath or chest pain. Patient states the pain is controlled.  Objective: Filed Vitals:   05/04/14 0423 05/04/14 0600 05/04/14 0755 05/04/14 1050  BP:  105/48  129/59  Pulse:  77  74  Temp:  98.7 F (37.1 C)  98 F (36.7 C)  TempSrc:  Oral  Oral  Resp: 18 24 22 20   Height:      Weight:      SpO2: 93% 96% 93% 93%    Intake/Output Summary (Last 24 hours) at  05/04/14 1342 Last data filed at 05/04/14 1050  Gross per 24 hour  Intake 2310.83 ml  Output   1160 ml  Net 1150.83 ml   Filed Weights   04/29/14 1700  Weight: 52.164 kg (115 lb)    Exam:   General:  Awake, laying in bed, in nad  Cardiovascular: regular, s1, s2  Respiratory: ctab anterior lung fields.  Abdomen: soft, hypoactive bowel sounds, dressing in place, serous output noted from drains.  Musculoskeletal: no c/c/e  Data Reviewed: Basic Metabolic Panel:  Recent Labs Lab 04/30/14 0534 05/01/14 0522 05/02/14 0525 05/03/14 0600 05/04/14 0553  NA 134* 136 137 137 142  K 4.4 4.7 4.6 4.1 5.1  CL 105 109 113* 110 111  CO2 22 19 21 22 25   GLUCOSE 160* 172* 138* 113* 177*  BUN 31* 26* 18 14 24*  CREATININE 1.17* 1.03 0.83 0.92 1.05  CALCIUM 7.9* 7.7* 8.0* 8.3* 8.0*   Liver Function Tests:  Recent Labs Lab 04/28/14 1745 04/29/14 0605 05/01/14 0522 05/03/14 0600  AST 20 21 16 15   ALT 15 12 9 10   ALKPHOS 68 55 58 59  BILITOT 0.7 1.1 1.0 1.3*  PROT 6.6 5.8* 5.7* 5.8*  ALBUMIN 3.4* 2.9* 2.2* 2.2*    Recent Labs Lab 04/28/14 1745  LIPASE 26   No results for input(s): AMMONIA in the last 168 hours. CBC:  Recent Labs Lab 04/28/14 1745 04/29/14 0605 04/30/14 0534 05/01/14 0522 05/02/14 0525 05/03/14 0600 05/04/14 0553  WBC 5.5 9.6 16.2* 10.6* 11.1* 8.6 9.9  NEUTROABS 4.5 8.5* 15.1* 9.8* 9.8*  --   --  HGB 14.4 12.9 12.3 11.1* 10.7* 10.8* 10.4*  HCT 43.6 39.2 37.6 33.9* 33.2* 33.4* 32.0*  MCV 91.8 91.2 92.6 91.1 91.0 90.3 90.4  PLT 255 238 220 210 224 260 290   Cardiac Enzymes: No results for input(s): CKTOTAL, CKMB, CKMBINDEX, TROPONINI in the last 168 hours. BNP (last 3 results) No results for input(s): BNP in the last 8760 hours.  ProBNP (last 3 results) No results for input(s): PROBNP in the last 8760 hours.  CBG:  Recent Labs Lab 05/02/14 0616 05/02/14 1208 05/02/14 1917 05/03/14 0007 05/03/14 0609  GLUCAP 113* 115* 122* 128*  110*    Recent Results (from the past 240 hour(s))  Surgical pcr screen     Status: None   Collection Time: 05/03/14  5:30 AM  Result Value Ref Range Status   MRSA, PCR NEGATIVE NEGATIVE Final   Staphylococcus aureus NEGATIVE NEGATIVE Final    Comment:        The Xpert SA Assay (FDA approved for NASAL specimens in patients over 61 years of age), is one component of a comprehensive surveillance program.  Test performance has been validated by [facility] for patients greater than or equal to 96 year old. It is not intended to diagnose infection nor to guide or monitor treatment.      Studies: Ct Abdomen Pelvis W Contrast  05/02/2014   CLINICAL DATA:  Diverticulitis with colon perforation.  EXAM: CT ABDOMEN AND PELVIS WITH CONTRAST  TECHNIQUE: Multidetector CT imaging of the abdomen and pelvis was performed using the standard protocol following bolus administration of intravenous contrast.  CONTRAST:  24mL OMNIPAQUE IOHEXOL 300 MG/ML  SOLN  COMPARISON:  04/28/2014  FINDINGS: Small bilateral pleural effusions with compressive atelectasis in the lower lobes. Heart is normal size.  There is increasing free air anterior to the liver and elsewhere anteriorly within the abdomen and pelvis. Multiple complex fluid collections are noted in the pelvis with enhancing rims compatible with developing abscesses. Air-fluid levels noted within these fluid collections. Large left pelvic fluid collection measures 6.5 x 6.3 cm. Fluid collection in the posterior right pelvis measures 6.9 x 5.1 cm. It is difficult to determine if these fluid collections communicate. Other fluid collections also noted within the lower abdomen on the left (7.2 x 3.6 cm on image 54) as well as a small fluid collection in the left posterior pelvis on image 78.  Continued inflammatory stranding around the sigmoid colon with diffuse diverticulosis. No evidence of bowel obstruction. Small low-density area centrally within the liver  compatible with cyst. Calcifications in the spleen compatible with old granulomatous disease. Small amount of fluid around the gallbladder without definite gallstones or wall thickening. Pancreas, adrenals are unremarkable. Bilateral renal cysts are noted, both cortical and parapelvic. No hydronephrosis.  Aorta and iliac vessels are normal caliber. No acute bony abnormality. 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  IMPRESSION: Increasing free air within the abdomen and pelvis. Organizing fluid collections throughout the pelvis and lower abdomen compatible with developing abscesses containing air-fluid levels. Continued inflammatory stranding around the sigmoid colon compatible with diverticulitis.  New small bilateral pleural effusions with bibasilar compressive atelectasis.  Dr. Erlinda Hong was paged at 3:49 p.m. to discuss this case. However, the page was not returned. We will continue to attempt tip age Dr. Erlinda Hong regarding this case.   Electronically Signed   By: Rolm Baptise M.D.   On: 05/02/2014 16:18    Scheduled Meds: . heparin subcutaneous  5,000 Units Subcutaneous 3 times  per day  . morphine   Intravenous 6 times per day  . pantoprazole (PROTONIX) IV  40 mg Intravenous QHS  . piperacillin-tazobactam (ZOSYN)  IV  3.375 g Intravenous Q8H   Continuous Infusions: . 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 If 7PM-7AM, please contact night-coverage at [email or web address], password [facility] 05/04/2014, 1:42 PM  LOS: 6 days

## 2014-05-05 DIAGNOSIS — R1084 Generalized abdominal pain: Secondary | ICD-10-CM

## 2014-05-05 LAB — CBC
HCT: 28.1 % — ABNORMAL LOW (ref 36.0–46.0)
Hemoglobin: 9.1 g/dL — ABNORMAL LOW (ref 12.0–15.0)
MCH: 29.5 pg (ref 26.0–34.0)
MCHC: 32.4 g/dL (ref 30.0–36.0)
MCV: 91.2 fL (ref 78.0–100.0)
PLATELETS: 319 10*3/uL (ref 150–400)
RBC: 3.08 MIL/uL — ABNORMAL LOW (ref 3.87–5.11)
RDW: 14.3 % (ref 11.5–15.5)
WBC: 9.3 10*3/uL (ref 4.0–10.5)

## 2014-05-05 LAB — BASIC METABOLIC PANEL
Anion gap: 5 (ref 5–15)
BUN: 27 mg/dL — AB (ref 6–23)
CHLORIDE: 107 mmol/L (ref 96–112)
CO2: 25 mmol/L (ref 19–32)
Calcium: 7.8 mg/dL — ABNORMAL LOW (ref 8.4–10.5)
Creatinine, Ser: 0.78 mg/dL (ref 0.50–1.10)
GFR calc Af Amer: >90 mL/min (ref >90)
GFR calc non Af Amer: 81 mL/min — ABNORMAL LOW (ref >90)
Glucose, Bld: 136 mg/dL — ABNORMAL HIGH (ref 70–99)
POTASSIUM: 3.8 mmol/L (ref 3.5–5.1)
SODIUM: 137 mmol/L (ref 135–145)

## 2014-05-05 LAB — GLUCOSE, CAPILLARY: Glucose-Capillary: 153 mg/dL — ABNORMAL HIGH (ref 70–99)

## 2014-05-05 MED ORDER — DEXTROSE IN LACTATED RINGERS 5 % IV SOLN
INTRAVENOUS | Status: DC
  Administered 2014-05-05: 85 mL/h via INTRAVENOUS
  Administered 2014-05-06: 23:00:00 via INTRAVENOUS

## 2014-05-05 MED ORDER — INSULIN ASPART 100 UNIT/ML ~~LOC~~ SOLN
0.0000 [IU] | Freq: Four times a day (QID) | SUBCUTANEOUS | Status: DC
  Administered 2014-05-06: 1 [IU] via SUBCUTANEOUS
  Administered 2014-05-06: 2 [IU] via SUBCUTANEOUS
  Administered 2014-05-06: 1 [IU] via SUBCUTANEOUS
  Administered 2014-05-07: 2 [IU] via SUBCUTANEOUS
  Administered 2014-05-07 (×2): 1 [IU] via SUBCUTANEOUS
  Administered 2014-05-07 – 2014-05-08 (×2): 2 [IU] via SUBCUTANEOUS
  Administered 2014-05-08 (×2): 1 [IU] via SUBCUTANEOUS
  Administered 2014-05-08: 2 [IU] via SUBCUTANEOUS
  Administered 2014-05-09: 1 [IU] via SUBCUTANEOUS

## 2014-05-05 MED ORDER — TRACE MINERALS CR-CU-F-FE-I-MN-MO-SE-ZN IV SOLN
INTRAVENOUS | Status: AC
  Administered 2014-05-05: 18:00:00 via INTRAVENOUS
  Filled 2014-05-05: qty 720

## 2014-05-05 MED ORDER — FAT EMULSION 20 % IV EMUL
240.0000 mL | INTRAVENOUS | Status: AC
Start: 2014-05-05 — End: 2014-05-06
  Administered 2014-05-05: 240 mL via INTRAVENOUS
  Filled 2014-05-05: qty 250

## 2014-05-05 NOTE — Progress Notes (Signed)
PARENTERAL NUTRITION CONSULT NOTE - INITIAL  Pharmacy Consult for TPN Indication: Perforated diverticulitis with multiple intra-abdominal abscesses; s/p laparotomy 05/03/14; anticipate prolonged post-operative ileus.  Allergies  Allergen Reactions  . Codeine Nausea And Vomiting  . Sulfonamide Derivatives Other (See Comments)    Childhood allergy; reaction unknown    Patient Measurements: Height: 5\' 2"  (157.5 cm) (per pt) Weight: 115 lb (52.164 kg) (per pt) IBW/kg (Calculated) : 50.1 . Vital Signs: Temp: 98.7 F (37.1 C) (02/25 0630) Temp Source: Oral (02/25 0630) BP: 127/55 mmHg (02/25 0630) Pulse Rate: 75 (02/25 0630) Intake/Output from previous day: 02/24 0701 - 02/25 0700 In: 3256.3 [I.V.:2535.4; IV Piggyback:720.8] Out: 6789 [Urine:1390; Emesis/NG output:290; Drains:70; Stool:20] Intake/Output from this shift:    Labs:  Recent Labs  05/03/14 0600 05/04/14 0553 05/05/14 0525  WBC 8.6 9.9 9.3  HGB 10.8* 10.4* 9.1*  HCT 33.4* 32.0* 28.1*  PLT 260 290 319  APTT 30  --   --   INR 1.14  --   --      Recent Labs  05/03/14 0600 05/04/14 0553 05/05/14 0525  NA 137 142 137  K 4.1 5.1 3.8  CL 110 111 107  CO2 22 25 25   GLUCOSE 113* 177* 136*  BUN 14 24* 27*  CREATININE 0.92 1.05 0.78  CALCIUM 8.3* 8.0* 7.8*  PROT 5.8*  --   --   ALBUMIN 2.2*  --   --   AST 15  --   --   ALT 10  --   --   ALKPHOS 59  --   --   BILITOT 1.3*  --   --   PREALBUMIN 6.0*  --   --   Corrected Calcium = 9.2  Estimated Creatinine Clearance: 49.5 mL/min (by C-G formula based on Cr of 0.78).    Recent Labs  05/02/14 1917 05/03/14 0007 05/03/14 0609  GLUCAP 122* 128* 110*    Medical History: Past Medical History  Diagnosis Date  . Hematuria   . Tremor   . Hair loss   . Unspecified vitamin D deficiency   . Migraines   . Osteoporosis   . Osteoarthritis   . Allergic rhinitis   . Glaucoma     Medications:  Scheduled:  . heparin subcutaneous  5,000 Units  Subcutaneous 3 times per day  . [START ON 05/06/2014] insulin aspart  0-9 Units Subcutaneous 4 times per day  . morphine   Intravenous 6 times per day  . pantoprazole (PROTONIX) IV  40 mg Intravenous QHS  . piperacillin-tazobactam (ZOSYN)  IV  3.375 g Intravenous Q8H   Infusions:  . dextrose 5% lactated ringers 125 mL/hr at 05/05/14 0503   PRN: diphenhydrAMINE **OR** diphenhydrAMINE, HYDROmorphone (DILAUDID) injection, naloxone **AND** sodium chloride, ondansetron **OR** ondansetron (ZOFRAN) IV, ondansetron (ZOFRAN) IV, promethazine, sodium chloride  Insulin Requirements:  None at present  Current Nutrition:  NPO  IVF:  D-5-LR at 125 mL/hr  Central access:  PICC (placed 05/04/14)   TPN start date: 05/05/14  ASSESSMENT  HPI: 74 y/o F with perforated diverticulitis and multiple intra-abdominal abscesses; on 05/03/14 underwent exploratory laparotomy, sigmoid colectomy, drainage of abscesses, creation of descending colostomy.  Anticipate prolonged post-operative ileus.  Orders received to begin TPN with pharmacy dosing assistance.  Significant events:  2/24: PICC placed, but MD elected to provide a day of fluid resuscitation postoperatively before beginning TPN.  Today:   Glucose - slightly elevated, likely secondary to acute illness and physiological stress from surgery.  No history of DM.  Electrolytes - WNL (Mg, Phos not checked yet, will be assessed in AM)  Renal - SCr WNL, BUN slightly elevated (possibly reflecting intravascular volume depletion)  LFTs - T.Bili slightly elevated yesterday but < 1.5x ULN.  All others below ULN.  TGs - will be assessed in AM  Prealbumin - baseline 6.0 (2/23)  NUTRITIONAL GOALS                                                                                       RD recs: Kcal: 1400-1600/d Protein: 65-75 g/d Fluid: >/1.5 L/day   Clinimix-E  5/15 at a goal rate of 60 ml/hr + 20% fat emulsion at 10 ml/hr will provide: 72 g/day protein, 1502 Kcal/day.  PLAN                                                                                                                     1. At 1800 today:  Start Clinimix-E 5/15 at 30 ml/hr.  Start 20% fat emulsion at 10 ml/hr.  Reduce IVF to 85 ml/hr 2. TPN to contain standard multivitamins and trace elements. 3. Plan to advance Clinimix to goal rate as tolerated. 4. After TPN begins, check CBG q6h and cover with Novolog SSI sensitive-scale 5. Full TPN labs tomorrow. 6. TPN labs on Mondays & Thursdays 7. Follow clinical course daily.  Clayburn Pert, PharmD, BCPS Pager: [phone] 05/05/2014  8:35 AM

## 2014-05-05 NOTE — Progress Notes (Signed)
2 Days Post-Op  Subjective: She states she is doing well and has no complaints. Her pain is well controlled and she does not feel bloated. She is intermittently nauseous with sitting and standing. She has not vomited. She endorses using incentive spirometer and sitting on the edge of the bed and in bedside chair as tolerated.  Objective: Vital signs in last 24 hours: Temp:  [97.6 F (36.4 C)-98.7 F (37.1 C)] 98.7 F (37.1 C) (02/25 0630) Pulse Rate:  [72-75] 75 (02/25 0630) Resp:  [14-20] 19 (02/25 0630) BP: (120-129)/(54-70) 127/55 mmHg (02/25 0630) SpO2:  [92 %-96 %] 92 % (02/25 0630) FiO2 (%):  [30 %-44 %] 44 % (02/25 0157) Last BM Date: 05/03/14  Intake/Output from previous day: 02/24 0701 - 02/25 0700 In: 3256.3 [I.V.:2535.4; IV Piggyback:720.8] Out: 3976 [Urine:1390; Emesis/NG output:290; Drains:70; Stool:20] Intake/Output this shift:    PE: General- In NAD and in good spirits. Pulm: CTA b/l all fields Cardiac: RRR Abdomen- Distended abd with dressed open low mid-line incision. Hypoactive bowel sounds, tender around incision sites. Serous output from drains.  Lab Results:   Recent Labs  05/04/14 0553 05/05/14 0525  WBC 9.9 9.3  HGB 10.4* 9.1*  HCT 32.0* 28.1*  PLT 290 319   BMET  Recent Labs  05/04/14 0553 05/05/14 0525  NA 142 137  K 5.1 3.8  CL 111 107  CO2 25 25  GLUCOSE 177* 136*  BUN 24* 27*  CREATININE 1.05 0.78  CALCIUM 8.0* 7.8*   PT/INR  Recent Labs  05/03/14 0600  LABPROT 14.7  INR 1.14   Comprehensive Metabolic Panel:    Component Value Date/Time   NA 137 05/05/2014 0525   NA 142 05/04/2014 0553   K 3.8 05/05/2014 0525   K 5.1 05/04/2014 0553   CL 107 05/05/2014 0525   CL 111 05/04/2014 0553   CO2 25 05/05/2014 0525   CO2 25 05/04/2014 0553   BUN 27* 05/05/2014 0525   BUN 24* 05/04/2014 0553   CREATININE 0.78 05/05/2014 0525   CREATININE 1.05 05/04/2014 0553   GLUCOSE 136* 05/05/2014 0525   GLUCOSE 177* 05/04/2014 0553    CALCIUM 7.8* 05/05/2014 0525   CALCIUM 8.0* 05/04/2014 0553   AST 15 05/03/2014 0600   AST 16 05/01/2014 0522   ALT 10 05/03/2014 0600   ALT 9 05/01/2014 0522   ALKPHOS 59 05/03/2014 0600   ALKPHOS 58 05/01/2014 0522   BILITOT 1.3* 05/03/2014 0600   BILITOT 1.0 05/01/2014 0522   PROT 5.8* 05/03/2014 0600   PROT 5.7* 05/01/2014 0522   ALBUMIN 2.2* 05/03/2014 0600   ALBUMIN 2.2* 05/01/2014 0522     Studies/Results: No results found.  Anti-infectives: Anti-infectives    Start     Dose/Rate Route Frequency Ordered Stop   05/03/14 1600  piperacillin-tazobactam (ZOSYN) IVPB 3.375 g     3.375 g 12.5 mL/hr over 240 Minutes Intravenous Every 8 hours 05/03/14 1556     04/29/14 0400  piperacillin-tazobactam (ZOSYN) IVPB 3.375 g  Status:  Discontinued     3.375 g 12.5 mL/hr over 240 Minutes Intravenous 3 times per day 04/28/14 2103 04/28/14 2247   04/29/14 0400  piperacillin-tazobactam (ZOSYN) IVPB 3.375 g  Status:  Discontinued     3.375 g 12.5 mL/hr over 240 Minutes Intravenous 3 times per day 04/28/14 2250 05/03/14 1556   04/28/14 2100  metroNIDAZOLE (FLAGYL) IVPB 500 mg  Status:  Discontinued     500 mg 100 mL/hr over 60 Minutes Intravenous Every 6  hours 04/28/14 2049 04/28/14 2053   04/28/14 2045  piperacillin-tazobactam (ZOSYN) IVPB 3.375 g     3.375 g 100 mL/hr over 30 Minutes Intravenous  Once 04/28/14 2043 04/28/14 2133      Assessment Principal Problem:   Diverticulitis of colon with perforation - Partial Sigmoid Colectomy with Proximal Colostomy Active Problems:   Anxiety, mild   Dehydration - Fluid resuscitation   PC Malnutrition - Prealbumin 6.0,  TPN post fluid resuscitation    VTE prophylaxis - heparin     LOS: 7 days   Plan: Pt is POD 2 from partial sigmoid colectomy with proximal colostomy. Continue abx. Dressing change today. Start TPN tonight. Continue abx. Continue to use spirometer and ambulate as tolerated.    Gwenith Spitz 05/05/2014

## 2014-05-05 NOTE — Consult Note (Addendum)
WOC ostomy consult note Stoma type/location: LLQ Colostomy Stomal assessment/size: 1 and 3/8 inch round, moist, red, edematous Peristomal assessment: intact with mild depression at 9 o'clock Treatment options for stomal/peristomal skin: skin barrier ring Output serosanguinous in pouch, <59mls Ostomy pouching: 2pc., 2 and 1/4 inch pouching system selected initially. Education provided: Husband observing and participating in pouch change.  Both asking appropriate questions.  Patient is using PCA pump, still asking to see photos of stoma taken by her husband.  Cut aperture 1 and 1/2 inch and used skin barrier ring to protect skin in the immediate parastomal area.  Supplies at bedside, book and one-page teaching sheet provided.  Advised staff RN to make abdominal dressing a little smaller and cover with folded 4x4s rather than ABD pad to accommodate pouch. [place] nursing team will follow, and will remain available to this patient, her family, the surgical,  the nursing and medical teams.  Thanks, Maudie Flakes, MSN, RN, [place], [place], [place] [phone])

## 2014-05-05 NOTE — Progress Notes (Signed)
[facility] PROGRESS NOTE  Kristen Castillo:[id number] DOB: February 26, 1941 DOA: 04/28/2014 PCP: Walker Kehr, MD  Assessment/Plan: 1. Acute sigmoid diverticulitis with perforation 1. Surgery following 2. Initial medical management alone with broad spectrum abx 3. Pt showed no real improvement, thus CT abd was ordered on 2/23, demonstrating increasing free air with multiple areas of fluid collection c/w abscess as large as 7cm in diameter 4. Pt underwent surgery on 2/23 without reported complications 5. Pt is continued on zosyn and probiotic 6. Patient to be started on TNA today., hopefully tomorrow once PICC line has been placed. General surgery following. 2. Dehydration 1. Cont hydration as tolerated 2. Improved 3. Migraines 1. Stable 2. None currently 4. Osteoarthritis 1. Stable 2. No active joint pains at present 5. DVT prophylaxis 1. SCD's  Code Status: Full Family Communication: Updated patient and husband at bedside. Disposition Plan: Per general surgery.   Consultants:  Surgery: Dr. Johney Maine to 18 2016  Procedures:  Diagnostic laparoscopy and drainage of right pelvic and left lower quadrant abscess area respiratory laparotomy, sigmoid colectomy, drainage of left pelvic abscess, descending colostomy. Per Dr. Zella Richer 05/03/2014  CT abdomen and pelvis 05/02/2014  Abdominal x-rays 05/02/2014, 04/30/2014, 04/29/2014  Chest x-ray 04/28/2014  PICC line 05/04/2014  Antibiotics:  Zosyn 2/18>>> (indicate start date, and stop date if known)  HPI/Subjective: Patient states she's been a little bit better. Patient denies any shortness of breath or chest pain. Patient states the pain is controlled.  Objective: Filed Vitals:   05/05/14 1200 05/05/14 1415 05/05/14 1555 05/05/14 1949  BP:  144/65    Pulse:  82    Temp:  98.2 F (36.8 C)    TempSrc:  Axillary    Resp: 14 16 16 14   Height:      Weight:      SpO2: 96% 94% 95% 97%    Intake/Output Summary  (Last 24 hours) at 05/05/14 2001 Last data filed at 05/05/14 1900  Gross per 24 hour  Intake   2240 ml  Output   1865 ml  Net    375 ml   Filed Weights   04/29/14 1700  Weight: 52.164 kg (115 lb)    Exam:   General:  Awake, laying in bed, in nad  Cardiovascular: regular, s1, s2  Respiratory: ctab anterior lung fields.  Abdomen: soft, hypoactive bowel sounds, dressing in place, serous output noted from drains.  Musculoskeletal: no c/c/e  Data Reviewed: Basic Metabolic Panel:  Recent Labs Lab 05/01/14 0522 05/02/14 0525 05/03/14 0600 05/04/14 0553 05/05/14 0525  NA 136 137 137 142 137  K 4.7 4.6 4.1 5.1 3.8  CL 109 113* 110 111 107  CO2 19 21 22 25 25   GLUCOSE 172* 138* 113* 177* 136*  BUN 26* 18 14 24* 27*  CREATININE 1.03 0.83 0.92 1.05 0.78  CALCIUM 7.7* 8.0* 8.3* 8.0* 7.8*   Liver Function Tests:  Recent Labs Lab 04/29/14 0605 05/01/14 0522 05/03/14 0600  AST 21 16 15   ALT 12 9 10   ALKPHOS 55 58 59  BILITOT 1.1 1.0 1.3*  PROT 5.8* 5.7* 5.8*  ALBUMIN 2.9* 2.2* 2.2*   No results for input(s): LIPASE, AMYLASE in the last 168 hours. No results for input(s): AMMONIA in the last 168 hours. CBC:  Recent Labs Lab 04/29/14 0605 04/30/14 0534 05/01/14 0522 05/02/14 0525 05/03/14 0600 05/04/14 0553 05/05/14 0525  WBC 9.6 16.2* 10.6* 11.1* 8.6 9.9 9.3  NEUTROABS 8.5* 15.1* 9.8* 9.8*  --   --   --  HGB 12.9 12.3 11.1* 10.7* 10.8* 10.4* 9.1*  HCT 39.2 37.6 33.9* 33.2* 33.4* 32.0* 28.1*  MCV 91.2 92.6 91.1 91.0 90.3 90.4 91.2  PLT 238 220 210 224 260 290 319   Cardiac Enzymes: No results for input(s): CKTOTAL, CKMB, CKMBINDEX, TROPONINI in the last 168 hours. BNP (last 3 results) No results for input(s): BNP in the last 8760 hours.  ProBNP (last 3 results) No results for input(s): PROBNP in the last 8760 hours.  CBG:  Recent Labs Lab 05/02/14 0616 05/02/14 1208 05/02/14 1917 05/03/14 0007 05/03/14 0609  GLUCAP 113* 115* 122* 128* 110*     Recent Results (from the past 240 hour(s))  Surgical pcr screen     Status: None   Collection Time: 05/03/14  5:30 AM  Result Value Ref Range Status   MRSA, PCR NEGATIVE NEGATIVE Final   Staphylococcus aureus NEGATIVE NEGATIVE Final    Comment:        The Xpert SA Assay (FDA approved for NASAL specimens in patients over 73 years of age), is one component of a comprehensive surveillance program.  Test performance has been validated by [facility] for patients greater than or equal to 71 year old. It is not intended to diagnose infection nor to guide or monitor treatment.      Studies: No results found.  Scheduled Meds: . heparin subcutaneous  5,000 Units Subcutaneous 3 times per day  . [START ON 05/06/2014] insulin aspart  0-9 Units Subcutaneous 4 times per day  . morphine   Intravenous 6 times per day  . pantoprazole (PROTONIX) IV  40 mg Intravenous QHS  . piperacillin-tazobactam (ZOSYN)  IV  3.375 g Intravenous Q8H   Continuous Infusions: . dextrose 5% lactated ringers    . Marland KitchenTPN (CLINIMIX-E) Adult 30 mL/hr at 05/05/14 1823   And  . 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 If 7PM-7AM, please contact night-coverage at [email or web address], password [facility] 05/05/2014, 8:01 PM  LOS: 7 days

## 2014-05-06 LAB — CBC
HCT: 27.7 % — ABNORMAL LOW (ref 36.0–46.0)
Hemoglobin: 9 g/dL — ABNORMAL LOW (ref 12.0–15.0)
MCH: 29.6 pg (ref 26.0–34.0)
MCHC: 32.5 g/dL (ref 30.0–36.0)
MCV: 91.1 fL (ref 78.0–100.0)
Platelets: 337 10*3/uL (ref 150–400)
RBC: 3.04 MIL/uL — AB (ref 3.87–5.11)
RDW: 13.9 % (ref 11.5–15.5)
WBC: 11.4 10*3/uL — ABNORMAL HIGH (ref 4.0–10.5)

## 2014-05-06 LAB — DIFFERENTIAL
BASOS PCT: 0 % (ref 0–1)
Basophils Absolute: 0 10*3/uL (ref 0.0–0.1)
EOS ABS: 0.3 10*3/uL (ref 0.0–0.7)
Eosinophils Relative: 3 % (ref 0–5)
Lymphocytes Relative: 12 % (ref 12–46)
Lymphs Abs: 1.4 10*3/uL (ref 0.7–4.0)
MONO ABS: 0.5 10*3/uL (ref 0.1–1.0)
Monocytes Relative: 4 % (ref 3–12)
NEUTROS PCT: 81 % — AB (ref 43–77)
Neutro Abs: 9.2 10*3/uL — ABNORMAL HIGH (ref 1.7–7.7)

## 2014-05-06 LAB — COMPREHENSIVE METABOLIC PANEL
ALBUMIN: 1.8 g/dL — AB (ref 3.5–5.2)
ALT: 11 U/L (ref 0–35)
AST: 19 U/L (ref 0–37)
Alkaline Phosphatase: 36 U/L — ABNORMAL LOW (ref 39–117)
Anion gap: 4 — ABNORMAL LOW (ref 5–15)
BUN: 20 mg/dL (ref 6–23)
CHLORIDE: 107 mmol/L (ref 96–112)
CO2: 28 mmol/L (ref 19–32)
Calcium: 7.7 mg/dL — ABNORMAL LOW (ref 8.4–10.5)
Creatinine, Ser: 0.77 mg/dL (ref 0.50–1.10)
GFR calc Af Amer: >90 mL/min (ref >90)
GFR, EST NON AFRICAN AMERICAN: 81 mL/min — AB (ref >90)
Glucose, Bld: 141 mg/dL — ABNORMAL HIGH (ref 70–99)
POTASSIUM: 3.3 mmol/L — AB (ref 3.5–5.1)
Sodium: 139 mmol/L (ref 135–145)
Total Bilirubin: 0.6 mg/dL (ref 0.3–1.2)
Total Protein: 5.1 g/dL — ABNORMAL LOW (ref 6.0–8.3)

## 2014-05-06 LAB — TRIGLYCERIDES: TRIGLYCERIDES: 190 mg/dL — AB (ref <150)

## 2014-05-06 LAB — GLUCOSE, CAPILLARY
Glucose-Capillary: 135 mg/dL — ABNORMAL HIGH (ref 70–99)
Glucose-Capillary: 149 mg/dL — ABNORMAL HIGH (ref 70–99)
Glucose-Capillary: 157 mg/dL — ABNORMAL HIGH (ref 70–99)

## 2014-05-06 LAB — MAGNESIUM: Magnesium: 1.8 mg/dL (ref 1.5–2.5)

## 2014-05-06 LAB — PREALBUMIN: PREALBUMIN: 9.6 mg/dL — AB (ref 17.0–34.0)

## 2014-05-06 LAB — PHOSPHORUS: PHOSPHORUS: 3.2 mg/dL (ref 2.3–4.6)

## 2014-05-06 MED ORDER — TRACE MINERALS CR-CU-F-FE-I-MN-MO-SE-ZN IV SOLN
INTRAVENOUS | Status: AC
  Administered 2014-05-06: 18:00:00 via INTRAVENOUS
  Filled 2014-05-06: qty 720

## 2014-05-06 MED ORDER — FAT EMULSION 20 % IV EMUL
240.0000 mL | INTRAVENOUS | Status: AC
  Administered 2014-05-06: 240 mL via INTRAVENOUS
  Filled 2014-05-06: qty 250

## 2014-05-06 MED ORDER — POTASSIUM CHLORIDE 10 MEQ/100ML IV SOLN
10.0000 meq | INTRAVENOUS | Status: AC
  Administered 2014-05-06 (×4): 10 meq via INTRAVENOUS
  Filled 2014-05-06 (×4): qty 100

## 2014-05-06 NOTE — Progress Notes (Signed)
3 Days Post-Op  Subjective: She states she is doing well and has no complaints. Her pain is well controlled and she does not feel bloated. She denies nausea and has not vomited. She endorses using incentive spirometer and sitting on the edge of the bed and in bedside chair as tolerated.  Has urgency to void  Objective: Vital signs in last 24 hours: Temp:  [97.4 F (36.3 C)-98.2 F (36.8 C)] 97.4 F (36.3 C) (02/26 0600) Pulse Rate:  [74-82] 76 (02/26 0600) Resp:  [14-20] 20 (02/26 0733) BP: (124-144)/(52-77) 129/53 mmHg (02/26 0600) SpO2:  [94 %-100 %] 94 % (02/26 0733) Weight:  [115 lb 8.3 oz (52.4 kg)] 115 lb 8.3 oz (52.4 kg) (02/26 0653) Last BM Date: 05/03/14  Intake/Output from previous day: 02/25 0701 - 02/26 0700 In: 2110.7 [P.O.:420; I.V.:891.1; NG/GT:175; IV Piggyback:200; TPN:424.7] Out: 2970 [Urine:2750; Emesis/NG output:150; Drains:70] Intake/Output this shift:    PE: General- In NAD and in good spirits. Pulm: CTA b/l all fields Cardiac: RRR Abdomen- Distended abd with dressed open wound that is clean and was redressed. Hypoactive bowel sounds, tender around incision sites. Serous output from drain. Colostomy pink with no significant output Foley catheter kinked at leg attachment site.  Lab Results:   Recent Labs  05/05/14 0525 05/06/14 0425  WBC 9.3 11.4*  HGB 9.1* 9.0*  HCT 28.1* 27.7*  PLT 319 337   BMET  Recent Labs  05/05/14 0525 05/06/14 0425  NA 137 139  K 3.8 3.3*  CL 107 107  CO2 25 28  GLUCOSE 136* 141*  BUN 27* 20  CREATININE 0.78 0.77  CALCIUM 7.8* 7.7*   PT/INR No results for input(s): LABPROT, INR in the last 72 hours. Comprehensive Metabolic Panel:    Component Value Date/Time   NA 139 05/06/2014 0425   NA 137 05/05/2014 0525   K 3.3* 05/06/2014 0425   K 3.8 05/05/2014 0525   CL 107 05/06/2014 0425   CL 107 05/05/2014 0525   CO2 28 05/06/2014 0425   CO2 25 05/05/2014 0525   BUN 20 05/06/2014 0425   BUN 27* 05/05/2014  0525   CREATININE 0.77 05/06/2014 0425   CREATININE 0.78 05/05/2014 0525   GLUCOSE 141* 05/06/2014 0425   GLUCOSE 136* 05/05/2014 0525   CALCIUM 7.7* 05/06/2014 0425   CALCIUM 7.8* 05/05/2014 0525   AST 19 05/06/2014 0425   AST 15 05/03/2014 0600   ALT 11 05/06/2014 0425   ALT 10 05/03/2014 0600   ALKPHOS 36* 05/06/2014 0425   ALKPHOS 59 05/03/2014 0600   BILITOT 0.6 05/06/2014 0425   BILITOT 1.3* 05/03/2014 0600   PROT 5.1* 05/06/2014 0425   PROT 5.8* 05/03/2014 0600   ALBUMIN 1.8* 05/06/2014 0425   ALBUMIN 2.2* 05/03/2014 0600     Studies/Results: No results found.  Anti-infectives: Anti-infectives    Start     Dose/Rate Route Frequency Ordered Stop   05/03/14 1600  piperacillin-tazobactam (ZOSYN) IVPB 3.375 g     3.375 g 12.5 mL/hr over 240 Minutes Intravenous Every 8 hours 05/03/14 1556     04/29/14 0400  piperacillin-tazobactam (ZOSYN) IVPB 3.375 g  Status:  Discontinued     3.375 g 12.5 mL/hr over 240 Minutes Intravenous 3 times per day 04/28/14 2103 04/28/14 2247   04/29/14 0400  piperacillin-tazobactam (ZOSYN) IVPB 3.375 g  Status:  Discontinued     3.375 g 12.5 mL/hr over 240 Minutes Intravenous 3 times per day 04/28/14 2250 05/03/14 1556   04/28/14 2100  metroNIDAZOLE (  FLAGYL) IVPB 500 mg  Status:  Discontinued     500 mg 100 mL/hr over 60 Minutes Intravenous Every 6 hours 04/28/14 2049 04/28/14 2053   04/28/14 2045  piperacillin-tazobactam (ZOSYN) IVPB 3.375 g     3.375 g 100 mL/hr over 30 Minutes Intravenous  Once 04/28/14 2043 04/28/14 2133      Assessment Principal Problem:   Diverticulitis of colon with perforation - Partial Sigmoid Colectomy with descending  Colostomy (05/03/14, Leona Pressly)-postop ileus as expected Active Problems:   Anxiety, mild - Controlled   Dehydration - Fluid resuscitation   PC Malnutrition - Prealbumin 6.0, on TPN   VTE prophylaxis - heparin   ABL anemia - Monitor H/H   Hypokalemia mild    LOS: 8 days   Plan: Wait for  return of bowel function.  Continue IV abxs. VAC to wound.  Continue TPN. Remove foley tomorrow.  She is at increased risk of postop infection given the multiple abscesses found at surgery.   Gwenith Spitz 05/06/2014

## 2014-05-06 NOTE — Consult Note (Signed)
WOC wound consult note Reason for Consult:Placement of NPWT device Wound type:Surgical wound left open to heal by secondary intention Pressure Ulcer POA: No Measurement:12cm x 4cm x 2.5cm Wound bed:red, moist Drainage (amount, consistency, odor) scant serous Periwound:itnact, clear Dressing procedure/placement/frequency: Patient prepared for procedure and all questions answered.  Family friend who is an Therapist, sports is with patient. NPWT device placed using 1 piece of black foam cut to fit around umbilicus. Foam covered with drape and attached to pump without difficulty.  An immediate seal is achieved at 169mmHg negative pressure.  Dressing is labeled and patient tolerated procedure well. M-W-Friday schedule is established. Ostomy pouching will likely occur with NPWT dressing changes.  WOC ostomy follow up Stoma type/location: LLQ Colostomy Stomal assessment/size: 1 and 3/8 inches round, red, moist Peristomal assessment: not seen today Treatment options for stomal/peristomal skin: skin barrier ring Output yellow mucous Ostomy pouching: 2pc. Pouching is intact Education provided: Patient education was related to NPWT today. Enrolled patient in [place] Start Discharge program: No  WOC nursing team will follow, and will remain available to this patient, the surgical, nursing and medical teams.   Thanks, Maudie Flakes, MSN, RN, [place], [place], [place] [phone])

## 2014-05-06 NOTE — Progress Notes (Signed)
NUTRITION FOLLOW UP  Intervention:   -TPN per pharmacy  Nutrition Dx:   Inadequate oral intake related to inability to eat as evidenced by NPO status; ongoing.  Goal:   Pt to meet >/= 90% of estimated needs; not met.  Monitor:   Pt TPN tolerance, weight trends, labs  Assessment:   Pt with no significant past medical history presents with diverticulitis of colon with perforation. Consulted for TPN.   Per weight records and patient weight has been stable. Pt reports not change in appetite. Pt reports exercising 75 minutes everyday.   Nutrition Focus Physical Exam showed no signs of fat or muscle wasting.   Per pharmacy At 1800 today:  Continue Clinimix-E 5/15 at 30 ml/hr (no rate advancement until K improves).  Continue 20% fat emulsion at 10 ml/hr.  Per pharmacy TPN will stay at 30 ml/hr until K labs improve  Labs: K 3.3, CBGs slightly eleveated (Novolog and SSI started)    Height: Ht Readings from Last 1 Encounters:  04/29/14 _0  (1.575 m)    Weight Status:   Wt Readings from Last 1 Encounters:  05/06/14 115 lb 8.3 oz (52.4 kg)    Re-estimated needs:  Kcal: 1400-1600 kcal Protein: 65-75 g protein Fluid: >/1.5 L/day  Skin: Incision on abdomen  Diet Order: Diet NPO time specified Except for: Ice Chips TPN (CLINIMIX-E) Adult TPN (CLINIMIX-E) Adult   Intake/Output Summary (Last 24 hours) at 05/06/14 1038 Last data filed at 05/06/14 0900  Gross per 24 hour  Intake 1770.74 ml  Output   3620 ml  Net -1849.26 ml    Last BM: 2/23   Labs:   Recent Labs Lab 05/04/14 0553 05/05/14 0525 05/06/14 0425  NA 142 137 139  K 5.1 3.8 3.3*  CL 111 107 107  CO2 _1 BUN 24* 27* 20  CREATININE 1.05 0.78 0.77  CALCIUM 8.0* 7.8* 7.7*  MG  --   --  1.8  PHOS  --   --  3.2  GLUCOSE 177* 136* 141*    CBG (last 3)   Recent Labs  05/05/14 2353 05/06/14 0558  GLUCAP 153* 149*    Scheduled Meds: . heparin subcutaneous  5,000 Units  Subcutaneous 3 times per day  . insulin aspart  0-9 Units Subcutaneous 4 times per day  . morphine   Intravenous 6 times per day  . pantoprazole (PROTONIX) IV  40 mg Intravenous QHS  . piperacillin-tazobactam (ZOSYN)  IV  3.375 g Intravenous Q8H  . potassium chloride  10 mEq Intravenous Q1 Hr x 4    Continuous Infusions: . dextrose 5% lactated ringers 85 mL/hr (05/05/14 2331)  . Marland KitchenTPN (CLINIMIX-E) Adult 30 mL/hr at 05/05/14 1823   And  . fat emulsion 240 mL (05/05/14 1823)  . Marland KitchenTPN (CLINIMIX-E) Adult     And  . fat emulsion      Elmer Picker MS Dietetic Intern Pager Number [phone]

## 2014-05-06 NOTE — Progress Notes (Addendum)
PARENTERAL NUTRITION CONSULT NOTE   Pharmacy Consult for TPN Indication: Perforated diverticulitis with multiple intra-abdominal abscesses; s/p laparotomy 05/03/14; anticipate prolonged post-operative ileus.  Allergies  Allergen Reactions  . Codeine Nausea And Vomiting  . Sulfonamide Derivatives Other (See Comments)    Childhood allergy; reaction unknown    Patient Measurements: Height: 5\' 2"  (157.5 cm) (per pt) Weight: 115 lb 8.3 oz (52.4 kg) IBW/kg (Calculated) : 50.1 . Vital Signs: Temp: 97.4 F (36.3 C) (02/26 0600) Temp Source: Axillary (02/26 0600) BP: 129/53 mmHg (02/26 0600) Pulse Rate: 76 (02/26 0600) Intake/Output from previous day: 02/25 0701 - 02/26 0700 In: 2110.7 [P.O.:420; I.V.:891.1; NG/GT:175; IV Piggyback:200; TPN:424.7] Out: 2970 [Urine:2750; Emesis/NG output:150; Drains:70] Intake/Output from this shift:    Labs:  Recent Labs  05/04/14 0553 05/05/14 0525 05/06/14 0425  WBC 9.9 9.3 11.4*  HGB 10.4* 9.1* 9.0*  HCT 32.0* 28.1* 27.7*  PLT 290 319 337     Recent Labs  05/04/14 0553 05/05/14 0525 05/06/14 0425  NA 142 137 139  K 5.1 3.8 3.3*  CL 111 107 107  CO2 25 25 28   GLUCOSE 177* 136* 141*  BUN 24* 27* 20  CREATININE 1.05 0.78 0.77  CALCIUM 8.0* 7.8* 7.7*  MG  --   --  1.8  PHOS  --   --  3.2  PROT  --   --  5.1*  ALBUMIN  --   --  1.8*  AST  --   --  19  ALT  --   --  11  ALKPHOS  --   --  36*  BILITOT  --   --  0.6  TRIG  --   --  190*  Corrected Calcium = 9.5  Estimated Creatinine Clearance: 49.5 mL/min (by C-G formula based on Cr of 0.77).    Recent Labs  05/05/14 2353 05/06/14 0558  GLUCAP 153* 149*    Medical History: Past Medical History  Diagnosis Date  . Hematuria   . Tremor   . Hair loss   . Unspecified vitamin D deficiency   . Migraines   . Osteoporosis   . Osteoarthritis   . Allergic rhinitis   . Glaucoma     Medications:  Scheduled:  . heparin subcutaneous  5,000 Units Subcutaneous 3 times per  day  . insulin aspart  0-9 Units Subcutaneous 4 times per day  . morphine   Intravenous 6 times per day  . pantoprazole (PROTONIX) IV  40 mg Intravenous QHS  . piperacillin-tazobactam (ZOSYN)  IV  3.375 g Intravenous Q8H  . potassium chloride  10 mEq Intravenous Q1 Hr x 4   Infusions:  . dextrose 5% lactated ringers 85 mL/hr (05/05/14 2331)  . Marland KitchenTPN (CLINIMIX-E) Adult 30 mL/hr at 05/05/14 1823   And  . fat emulsion 240 mL (05/05/14 1823)   PRN: diphenhydrAMINE **OR** diphenhydrAMINE, HYDROmorphone (DILAUDID) injection, naloxone **AND** sodium chloride, ondansetron **OR** ondansetron (ZOFRAN) IV, ondansetron (ZOFRAN) IV, promethazine, sodium chloride  Insulin Requirements:  3 units Novolog SSI since TPN was started 12 hrs ago  Current Nutrition:  NPO Clinimix-E 5/15 at 30 mL/hr Fat Emulsion 20% at 10 mL/hr  IVF:  D-5-LR at 85 mL/hr  Central access:  PICC (placed 05/04/14)   TPN start date: 05/05/14  ASSESSMENT  HPI: 74 y/o F with perforated diverticulitis and multiple intra-abdominal abscesses; on 05/03/14 underwent exploratory laparotomy, sigmoid colectomy, drainage of abscesses, creation of descending colostomy.  Anticipate prolonged post-operative ileus.  Orders received to begin TPN with pharmacy dosing assistance.  Significant events:  2/24: PICC placed, but MD elected to provide a day of fluid resuscitation postoperatively before beginning TPN.  Today:   Glucose and CBGs - slightly elevated but < 180. Using small amounts of sliding-scale Novolog.  No history of DM.  Electrolytes - K low, others WNL  Renal - SCr and BUN WNL  LFTs - all below ULN.  TGs - slightly elevated; no change needed to Fat Emulsion unless > 400.  Prealbumin - baseline 6.0 (2/23), today's value pending  NUTRITIONAL GOALS                                                                                        RD recs: Kcal: 1400-1600/d Protein: 65-75 g/d Fluid: >/1.5 L/day   Clinimix-E 5/15 at a goal rate of 60 ml/hr + 20% fat emulsion at 10 ml/hr will provide: 72 g/day protein, 1502 Kcal/day.  PLAN                                                                                                                     1. KCl 101mEq IV q1h x 4 2. At 1800 today:  Continue Clinimix-E 5/15 at 30 ml/hr (no rate advancement until K improves).  Continue 20% fat emulsion at 10 ml/hr. 3. TPN to contain standard multivitamins and trace elements. 4. Plan to advance Clinimix to goal rate as tolerated. 5. Consider removing dextrose from mIVF when Clinimix rate is advanced. 6. Continue CBG q6h and cover with Novolog SSI sensitive-scale 7. BMet, Mg, Phos tomorrow. 6. Full TPN labs on Mondays & Thursdays 7. Follow clinical course daily.  Clayburn Pert, PharmD, BCPS Pager: [phone] 05/06/2014  7:26 AM

## 2014-05-07 LAB — MAGNESIUM: Magnesium: 1.9 mg/dL (ref 1.5–2.5)

## 2014-05-07 LAB — CBC
HCT: 27.5 % — ABNORMAL LOW (ref 36.0–46.0)
Hemoglobin: 9 g/dL — ABNORMAL LOW (ref 12.0–15.0)
MCH: 29.7 pg (ref 26.0–34.0)
MCHC: 32.7 g/dL (ref 30.0–36.0)
MCV: 90.8 fL (ref 78.0–100.0)
Platelets: 376 10*3/uL (ref 150–400)
RBC: 3.03 MIL/uL — AB (ref 3.87–5.11)
RDW: 13.8 % (ref 11.5–15.5)
WBC: 12.8 10*3/uL — ABNORMAL HIGH (ref 4.0–10.5)

## 2014-05-07 LAB — GLUCOSE, CAPILLARY
GLUCOSE-CAPILLARY: 142 mg/dL — AB (ref 70–99)
GLUCOSE-CAPILLARY: 144 mg/dL — AB (ref 70–99)
Glucose-Capillary: 146 mg/dL — ABNORMAL HIGH (ref 70–99)
Glucose-Capillary: 130 mg/dL — ABNORMAL HIGH (ref 70–99)

## 2014-05-07 LAB — BASIC METABOLIC PANEL
Anion gap: 4 — ABNORMAL LOW (ref 5–15)
BUN: 16 mg/dL (ref 6–23)
CO2: 28 mmol/L (ref 19–32)
Calcium: 8 mg/dL — ABNORMAL LOW (ref 8.4–10.5)
Chloride: 106 mmol/L (ref 96–112)
Creatinine, Ser: 0.7 mg/dL (ref 0.50–1.10)
GFR, EST NON AFRICAN AMERICAN: 84 mL/min — AB (ref >90)
Glucose, Bld: 128 mg/dL — ABNORMAL HIGH (ref 70–99)
POTASSIUM: 3.6 mmol/L (ref 3.5–5.1)
Sodium: 138 mmol/L (ref 135–145)

## 2014-05-07 LAB — PHOSPHORUS: Phosphorus: 3.1 mg/dL (ref 2.3–4.6)

## 2014-05-07 MED ORDER — LIP MEDEX EX OINT
1.0000 "application " | TOPICAL_OINTMENT | Freq: Two times a day (BID) | CUTANEOUS | Status: DC
  Administered 2014-05-07 – 2014-05-11 (×7): 1 via TOPICAL

## 2014-05-07 MED ORDER — LACTATED RINGERS IV BOLUS (SEPSIS): 1000.0000 mL | Freq: Three times a day (TID) | INTRAVENOUS | Status: AC | PRN

## 2014-05-07 MED ORDER — PHENOL 1.4 % MT LIQD: 2.0000 | OROMUCOSAL | Status: DC | PRN

## 2014-05-07 MED ORDER — POTASSIUM CHLORIDE 10 MEQ/100ML IV SOLN
10.0000 meq | INTRAVENOUS | Status: AC
  Administered 2014-05-07 (×4): 10 meq via INTRAVENOUS
  Filled 2014-05-07 (×4): qty 100

## 2014-05-07 MED ORDER — CLINIMIX E/DEXTROSE (5/15) 5 % IV SOLN
INTRAVENOUS | Status: AC
  Administered 2014-05-07: 18:00:00 via INTRAVENOUS
  Filled 2014-05-07: qty 984

## 2014-05-07 MED ORDER — PROMETHAZINE HCL 25 MG/ML IJ SOLN: 6.2500 mg | INTRAMUSCULAR | Status: DC | PRN

## 2014-05-07 MED ORDER — ALUM & MAG HYDROXIDE-SIMETH 200-200-20 MG/5ML PO SUSP
30.0000 mL | Freq: Four times a day (QID) | ORAL | Status: DC | PRN
Start: 2014-05-07 — End: 2014-05-11

## 2014-05-07 MED ORDER — DEXTROSE IN LACTATED RINGERS 5 % IV SOLN
INTRAVENOUS | Status: DC
  Administered 2014-05-07: 30 mL/h via INTRAVENOUS

## 2014-05-07 MED ORDER — METOPROLOL TARTRATE 1 MG/ML IV SOLN
5.0000 mg | Freq: Four times a day (QID) | INTRAVENOUS | Status: DC | PRN
  Filled 2014-05-07: qty 5

## 2014-05-07 MED ORDER — MENTHOL 3 MG MT LOZG: 1.0000 | LOZENGE | OROMUCOSAL | Status: DC | PRN

## 2014-05-07 MED ORDER — FAT EMULSION 20 % IV EMUL
240.0000 mL | INTRAVENOUS | Status: AC
  Administered 2014-05-07: 240 mL via INTRAVENOUS
  Filled 2014-05-07: qty 250

## 2014-05-07 MED ORDER — MAGIC MOUTHWASH
15.0000 mL | Freq: Four times a day (QID) | ORAL | Status: DC | PRN
  Filled 2014-05-07: qty 15

## 2014-05-07 NOTE — Progress Notes (Signed)
[address] [place], [place] [address] Phone: [phone] FAX: [phone]    MARIONA SCHOLES [id number] Aug 26, 1940  CARE TEAM:  PCP: Walker Kehr, MD  Outpatient Care Team: [name], [name], [name], [name], [name], [name], [name]  Inpatient Treatment Team: Treatment Team: Attending Provider: Nolon Nations, MD; Consulting Physician: Nolon Nations, MD; Technician: Delila Spence, NT; Technician: Julian Reil, NT; Technician: Leda Quail, NT; Technician: Jefm Bryant, NT; Registered Nurse: Mortimer Fries, RN; Registered Nurse: Eugenie Birks, RN; Technician: Rudi Heap, NT   Subjective:  Walking in hallwaya PCA helps No major events  Objective:  Vital signs:  Filed Vitals:   05/07/14 0230 05/07/14 0302 05/07/14 0426 05/07/14 0615  BP: 129/62   136/64  Pulse: 78   74  Temp: 98.4 F (36.9 C)   98.1 F (36.7 C)  TempSrc: Oral   Oral  Resp: 14 17 13 18   Height:      Weight:      SpO2: 97% 98% 96% 96%    Last BM Date: 04/28/14  Intake/Output   Yesterday:  02/26 0701 - 02/27 0700 In: 2796.1 [I.V.:2596.1; IV Piggyback:200] Out: 4940 [Urine:4000; Emesis/NG output:850; Drains:90] This shift:     Bowel function:  Flatus: n  BM: n  Drain: serossanguinous  Physical Exam:  General: Pt awake/alert/oriented x4 in no acute distress Eyes: PERRL, normal EOM.  Sclera clear.  No icterus Neuro: CN II-XII intact w/o focal sensory/motor deficits. Lymph: No head/neck/groin lymphadenopathy Psych:  No delerium/psychosis/paranoia HENT: Normocephalic, Mucus membranes moist.  No thrush.  NGT w thin bilious fluid Neck: Supple, No  tracheal deviation Chest: No chest wall pain w good excursion CV:  Pulses intact.  Regular rhythm MS: Normal AROM mjr joints.  No obvious deformity Abdomen: Soft.  Nondistended.  Mildly tender at incisions only.  Wound vac clean.  Colostomy pink.  No gas/stool.  No evidence of peritonitis.  No incarcerated hernias. Ext:  SCDs BLE.  No mjr edema.  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 female  4 Days Post-Op    PREOPERATIVE DX: Perforated diverticulitis with multiple intra-abdominal abscesses POSTOPERATIVE DX: Same  PROCEDURE: Diagnostic laparoscopy and drainage of right pelvic and left lower quadrant abscess. Exploratory laparotomy, sigmoid colectomy, drainage of left pelvic abscess, descending colostomy.    Recovering gradually  Plan:  -d/c foley  -cont IV ABx POD #4/10 Zosyn.  Prob needs 10 days minimum.  -repeat CT scan POD #5-7 to r/o abscess unless ileus gone & WBC WNL.  WBC up a liitle...  Follow for now.  Add Fluconazole if higher tomorrow  -PCA until better  -TNA until ileus resolves  -wound care - cont wound vac & follow  -colostomy care/training  -VTE prophylaxis- SCDs, etc  -mobilize as tolerated to help recovery - walking already a good sign  I updated the patient's  status to the patient, her husband & daughter.  Recommendations were made.  Questions were answered.  The patient expressed understanding & appreciation.   Adin Hector, M.D., F.A.C.S. Gastrointestinal and Minimally Invasive Surgery [facility] [address], [place], [place] [address] [phone] Main / Paging   05/07/2014   Results:   Labs: Results for orders placed or performed during the hospital encounter of 04/28/14 (from the past 48 hour(s))  Glucose, capillary     Status: Abnormal   Collection Time: 05/05/14 11:53 PM  Result Value Ref Range   Glucose-Capillary 153 (H) 70 - 99 mg/dL  Comprehensive metabolic panel     Status: Abnormal   Collection Time: 05/06/14  4:25 AM  Result Value Ref Range   Sodium 139 135 - 145 mmol/L   Potassium 3.3 (L) 3.5 - 5.1 mmol/L   Chloride 107 96 - 112 mmol/L   CO2 28 19 - 32 mmol/L   Glucose, Bld 141 (H) 70 - 99 mg/dL   BUN 20 6 - 23 mg/dL   Creatinine, Ser 0.77 0.50 - 1.10 mg/dL   Calcium 7.7 (L) 8.4 - 10.5 mg/dL   Total Protein 5.1 (L) 6.0 - 8.3 g/dL   Albumin 1.8 (L) 3.5 - 5.2 g/dL   AST 19 0 - 37 U/L   ALT 11 0 - 35 U/L   Alkaline Phosphatase 36 (L) 39 - 117 U/L   Total Bilirubin 0.6 0.3 - 1.2 mg/dL   GFR calc non Af Amer 81 (L) >90 mL/min   GFR calc Af Amer >90 >90 mL/min    Comment: (NOTE) The eGFR has been calculated using the CKD EPI equation. This calculation has not been validated in all clinical situations. eGFR's persistently <90 mL/min signify possible Chronic Kidney Disease.    Anion gap 4 (L) 5 - 15  Prealbumin     Status: Abnormal   Collection Time: 05/06/14  4:25 AM  Result Value Ref Range   Prealbumin 9.6 (L) 17.0 - 34.0 mg/dL    Comment: Performed at [facility]  Magnesium     Status: None   Collection Time: 05/06/14  4:25 AM  Result Value Ref Range   Magnesium 1.8 1.5 - 2.5 mg/dL  Phosphorus     Status: None   Collection Time: 05/06/14  4:25 AM  Result Value Ref Range   Phosphorus 3.2 2.3 - 4.6 mg/dL  Triglycerides     Status: Abnormal   Collection Time: 05/06/14  4:25 AM  Result Value Ref Range   Triglycerides 190 (H) <150 mg/dL    Comment: Performed at [facility]  CBC     Status: Abnormal   Collection Time: 05/06/14  4:25 AM   Result Value Ref Range   WBC 11.4 (H) 4.0 - 10.5 K/uL   RBC 3.04 (L) 3.87 - 5.11 MIL/uL   Hemoglobin 9.0 (L) 12.0 - 15.0 g/dL   HCT 27.7 (L) 36.0 - 46.0 %   MCV 91.1 78.0 - 100.0 fL   MCH 29.6 26.0 - 34.0 pg   MCHC 32.5 30.0 - 36.0 g/dL   RDW 13.9 11.5 - 15.5 %   Platelets 337 150 - 400 K/uL  Differential     Status: Abnormal   Collection Time: 05/06/14  4:25 AM  Result Value Ref Range   Neutrophils Relative % 81 (H) 43 - 77 %   Lymphocytes Relative 12 12 - 46 %   Monocytes Relative 4 3 -  12 %   Eosinophils Relative 3 0 - 5 %   Basophils Relative 0 0 - 1 %   Neutro Abs 9.2 (H) 1.7 - 7.7 K/uL   Lymphs Abs 1.4 0.7 - 4.0 K/uL   Monocytes Absolute 0.5 0.1 - 1.0 K/uL   Eosinophils Absolute 0.3 0.0 - 0.7 K/uL   Basophils Absolute 0.0 0.0 - 0.1 K/uL   WBC Morphology TOXIC GRANULATION   Glucose, capillary     Status: Abnormal   Collection Time: 05/06/14  5:58 AM  Result Value Ref Range   Glucose-Capillary 149 (H) 70 - 99 mg/dL  Glucose, capillary     Status: Abnormal   Collection Time: 05/06/14 11:47 AM  Result Value Ref Range   Glucose-Capillary 135 (H) 70 - 99 mg/dL  Glucose, capillary     Status: Abnormal   Collection Time: 05/07/14 12:00 AM  Result Value Ref Range   Glucose-Capillary 157 (H) 70 - 99 mg/dL  CBC     Status: Abnormal   Collection Time: 05/07/14  4:05 AM  Result Value Ref Range   WBC 12.8 (H) 4.0 - 10.5 K/uL   RBC 3.03 (L) 3.87 - 5.11 MIL/uL   Hemoglobin 9.0 (L) 12.0 - 15.0 g/dL   HCT 27.5 (L) 36.0 - 46.0 %   MCV 90.8 78.0 - 100.0 fL   MCH 29.7 26.0 - 34.0 pg   MCHC 32.7 30.0 - 36.0 g/dL   RDW 13.8 11.5 - 15.5 %   Platelets 376 150 - 400 K/uL  Basic metabolic panel     Status: Abnormal   Collection Time: 05/07/14  4:05 AM  Result Value Ref Range   Sodium 138 135 - 145 mmol/L   Potassium 3.6 3.5 - 5.1 mmol/L   Chloride 106 96 - 112 mmol/L   CO2 28 19 - 32 mmol/L   Glucose, Bld 128 (H) 70 - 99 mg/dL   BUN 16 6 - 23 mg/dL   Creatinine, Ser 0.70 0.50 -  1.10 mg/dL   Calcium 8.0 (L) 8.4 - 10.5 mg/dL   GFR calc non Af Amer 84 (L) >90 mL/min   GFR calc Af Amer >90 >90 mL/min    Comment: (NOTE) The eGFR has been calculated using the CKD EPI equation. This calculation has not been validated in all clinical situations. eGFR's persistently <90 mL/min signify possible Chronic Kidney Disease.    Anion gap 4 (L) 5 - 15  Magnesium     Status: None   Collection Time: 05/07/14  4:05 AM  Result Value Ref Range   Magnesium 1.9 1.5 - 2.5 mg/dL  Phosphorus     Status: None   Collection Time: 05/07/14  4:05 AM  Result Value Ref Range   Phosphorus 3.1 2.3 - 4.6 mg/dL  Glucose, capillary     Status: Abnormal   Collection Time: 05/07/14  6:21 AM  Result Value Ref Range   Glucose-Capillary 146 (H) 70 - 99 mg/dL    Imaging / Studies: No results found.  Medications / Allergies: per chart  Antibiotics: Anti-infectives    Start     Dose/Rate Route Frequency Ordered Stop   05/03/14 1600  piperacillin-tazobactam (ZOSYN) IVPB 3.375 g     3.375 g 12.5 mL/hr over 240 Minutes Intravenous Every 8 hours 05/03/14 1556     04/29/14 0400  piperacillin-tazobactam (ZOSYN) IVPB 3.375 g  Status:  Discontinued     3.375 g 12.5 mL/hr over 240 Minutes Intravenous 3 times per day 04/28/14 2103 04/28/14 2247  04/29/14 0400  piperacillin-tazobactam (ZOSYN) IVPB 3.375 g  Status:  Discontinued     3.375 g 12.5 mL/hr over 240 Minutes Intravenous 3 times per day 04/28/14 2250 05/03/14 1556   04/28/14 2100  metroNIDAZOLE (FLAGYL) IVPB 500 mg  Status:  Discontinued     500 mg 100 mL/hr over 60 Minutes Intravenous Every 6 hours 04/28/14 2049 04/28/14 2053   04/28/14 2045  piperacillin-tazobactam (ZOSYN) IVPB 3.375 g     3.375 g 100 mL/hr over 30 Minutes Intravenous  Once 04/28/14 2043 04/28/14 2133       Note: Portions of this report may have been transcribed using voice recognition software. Every effort was made to ensure accuracy; however, inadvertent  computerized transcription errors may be present.   Any transcriptional errors that result from this process are unintentional.

## 2014-05-07 NOTE — Progress Notes (Signed)
PARENTERAL NUTRITION CONSULT NOTE   Pharmacy Consult for TPN Indication: Perforated diverticulitis with multiple intra-abdominal abscesses; s/p laparotomy 05/03/14; anticipate prolonged post-operative ileus.   Allergies  Allergen Reactions  . Codeine Nausea And Vomiting  . Sulfonamide Derivatives Other (See Comments)    Childhood allergy; reaction unknown    Patient Measurements: Height: 5\' 2"  (157.5 cm) (per pt) Weight: 115 lb 8.3 oz (52.4 kg) IBW/kg (Calculated) : 50.1 . Vital Signs: Temp: 98.1 Castillo (36.7 C) (02/27 0615) Temp Source: Oral (02/27 0615) BP: 136/64 mmHg (02/27 0615) Pulse Rate: 74 (02/27 0615) Intake/Output from previous day: 02/26 0701 - 02/27 0700 In: 2796.1 [I.V.:2596.1; IV Piggyback:200] Out: 4940 [Urine:4000; Emesis/NG output:850; Drains:90] Intake/Output from this shift:    Labs:  Recent Labs  05/05/14 0525 05/06/14 0425 05/07/14 0405  WBC 9.3 11.4* 12.8*  HGB 9.1* 9.0* 9.0*  HCT 28.1* 27.7* 27.5*  PLT 319 337 376     Recent Labs  05/05/14 0525 05/06/14 0425 05/07/14 0405  NA 137 139 138  K 3.8 3.3* 3.6  CL 107 107 106  CO2 25 28 28   GLUCOSE 136* 141* 128*  BUN 27* 20 16  CREATININE 0.78 0.77 0.70  CALCIUM 7.8* 7.7* 8.0*  MG  --  1.8 1.9  PHOS  --  3.2 3.1  PROT  --  5.1*  --   ALBUMIN  --  1.8*  --   AST  --  19  --   ALT  --  11  --   ALKPHOS  --  36*  --   BILITOT  --  0.6  --   PREALBUMIN  --  9.6*  --   TRIG  --  190*  --   Corrected Calcium = 9.5  Estimated Creatinine Clearance: 49.5 mL/min (by C-G formula based on Cr of 0.7).    Recent Labs  05/06/14 1147 05/07/14 05/07/14 0621  GLUCAP 135* 157* 146*    Medical History: Past Medical History  Diagnosis Date  . Hematuria   . Tremor   . Hair loss   . Unspecified vitamin D deficiency   . Migraines   . Osteoporosis   . Osteoarthritis   . Allergic rhinitis   . Glaucoma     Medications:  Scheduled:  . heparin subcutaneous  5,000 Units Subcutaneous 3  times per day  . insulin aspart  0-9 Units Subcutaneous 4 times per day  . morphine   Intravenous 6 times per day  . pantoprazole (PROTONIX) IV  40 mg Intravenous QHS  . piperacillin-tazobactam (ZOSYN)  IV  3.375 g Intravenous Q8H   Infusions:  . dextrose 5% lactated ringers 85 mL/hr at 05/06/14 2256  . Marland KitchenTPN (CLINIMIX-E) Adult 30 mL/hr at 05/06/14 1740   And  . fat emulsion 240 mL (05/06/14 1740)   PRN: diphenhydrAMINE **OR** diphenhydrAMINE, naloxone **AND** sodium chloride, ondansetron **OR** ondansetron (ZOFRAN) IV, ondansetron (ZOFRAN) IV, sodium chloride  Insulin Requirements:  6 units Novolog SSI past 24h  Current Nutrition:  NPO Clinimix-E 5/15 at 30 mL/hr Fat Emulsion 20% at 10 mL/hr  IVF:  D-5-LR at 85 mL/hr  Central access:  PICC (placed 05/04/14)   TPN start date: 05/05/14  ASSESSMENT  HPI: 74 y/o Castillo with perforated diverticulitis and multiple intra-abdominal abscesses; on 05/03/14 underwent exploratory laparotomy, sigmoid colectomy, drainage of abscesses, creation of descending colostomy.  Anticipate prolonged post-operative ileus.  Orders received to begin TPN with pharmacy dosing assistance.  Significant events:  2/24: PICC placed, but MD elected to provide a day of fluid resuscitation postoperatively before beginning TPN.  Today:   Glucose and CBGs - slightly elevated but < 180. Using small amounts of sliding-scale Novolog.  No history of DM.  Electrolytes - K improved to low end of nml, others WNL  Renal - SCr and BUN WNL  LFTs - all below ULN.  TGs - slightly elevated; no change needed to Fat Emulsion unless > 400.  Prealbumin - baseline 6.0 (2/23), 9.6 (2/26)  NUTRITIONAL GOALS                                                                                       RD recs: Kcal: 1400-1600/d Protein: 65-75 g/d Fluid: >/1.5 L/day   Clinimix-E 5/15 at a  goal rate of 60 ml/hr + 20% fat emulsion at 10 ml/hr will provide: 72 g/day protein, 1502 Kcal/day.  PLAN                                                                                                                     1. KCl 80mEq IV q1h x 4 2. At 1800 today:  Increase Clinimix-E 5/15 to 41 ml/hr  Continue 20% fat emulsion at 10 ml/hr. 3. TPN to contain standard multivitamins and trace elements. 4. Plan to advance Clinimix to goal rate as tolerated. 5. Decrease mIVF to 70 ml/hr, consider removal of dextrose from IVF if CBGs remain elevated 6. Continue CBG q6h and cover with Novolog SSI sensitive-scale 7. BMet, Mg, Phos tomorrow. 6. Full TPN labs on Mondays & Thursdays 7. Follow clinical course daily.  Thank you for the consult.  Currie Paris, PharmD, BCPS Pager: [phone] Pharmacy: [phone] 05/07/2014 9:57 AM

## 2014-05-08 DIAGNOSIS — E441 Mild protein-calorie malnutrition: Secondary | ICD-10-CM | POA: Clinically undetermined

## 2014-05-08 LAB — GLUCOSE, CAPILLARY
GLUCOSE-CAPILLARY: 125 mg/dL — AB (ref 70–99)
GLUCOSE-CAPILLARY: 141 mg/dL — AB (ref 70–99)
GLUCOSE-CAPILLARY: 145 mg/dL — AB (ref 70–99)
GLUCOSE-CAPILLARY: 155 mg/dL — AB (ref 70–99)
Glucose-Capillary: 156 mg/dL — ABNORMAL HIGH (ref 70–99)

## 2014-05-08 MED ORDER — ACETAMINOPHEN 160 MG/5ML PO SOLN
1000.0000 mg | Freq: Three times a day (TID) | ORAL | Status: DC
  Administered 2014-05-08 – 2014-05-10 (×4): 1000 mg via ORAL
  Filled 2014-05-08 (×13): qty 40

## 2014-05-08 MED ORDER — SALINE SPRAY 0.65 % NA SOLN
1.0000 | NASAL | Status: DC | PRN
  Administered 2014-05-08: 1 via NASAL
  Filled 2014-05-08: qty 44

## 2014-05-08 MED ORDER — ACETAMINOPHEN 500 MG PO TABS: 1000.0000 mg | ORAL_TABLET | Freq: Three times a day (TID) | ORAL | Status: DC

## 2014-05-08 MED ORDER — FAT EMULSION 20 % IV EMUL
240.0000 mL | INTRAVENOUS | Status: DC
  Filled 2014-05-08: qty 250

## 2014-05-08 MED ORDER — TRACE MINERALS CR-CU-F-FE-I-MN-MO-SE-ZN IV SOLN
INTRAVENOUS | Status: DC
  Filled 2014-05-08: qty 1440

## 2014-05-08 MED ORDER — TRACE MINERALS CR-CU-F-FE-I-MN-MO-SE-ZN IV SOLN
INTRAVENOUS | Status: AC
  Administered 2014-05-08: 18:00:00 via INTRAVENOUS
  Filled 2014-05-08: qty 1440

## 2014-05-08 MED ORDER — FAT EMULSION 20 % IV EMUL
240.0000 mL | INTRAVENOUS | Status: AC
  Administered 2014-05-08: 240 mL via INTRAVENOUS
  Filled 2014-05-08: qty 250

## 2014-05-08 NOTE — Progress Notes (Signed)
PARENTERAL NUTRITION CONSULT NOTE   Pharmacy Consult for TPN Indication: Perforated diverticulitis with multiple intra-abdominal abscesses; s/p laparotomy 05/03/14; anticipate prolonged post-operative ileus.   Allergies  Allergen Reactions  . Codeine Nausea And Vomiting  . Sulfonamide Derivatives Other (See Comments)    Childhood allergy; reaction unknown    Patient Measurements: Height: 5\' 2"  (157.5 cm) (per pt) Weight: 115 lb 8.3 oz (52.4 kg) IBW/kg (Calculated) : 50.1 . Vital Signs: Temp: 97.9 F (36.6 C) (02/28 0639) Temp Source: Oral (02/28 0639) BP: 131/52 mmHg (02/28 0639) Pulse Rate: 71 (02/28 0639) Intake/Output from previous day: 02/27 0701 - 02/28 0700 In: 1854.5 [P.O.:420; I.V.:247.5; NG/GT:125; IV Piggyback:450; TPN:612] Out: 2355 [Urine:1550; Emesis/NG output:30; Drains:35; Stool:60] Intake/Output from this shift:    Labs:  Recent Labs  05/06/14 0425 05/07/14 0405  WBC 11.4* 12.8*  HGB 9.0* 9.0*  HCT 27.7* 27.5*  PLT 337 376     Recent Labs  05/06/14 0425 05/07/14 0405  NA 139 138  K 3.3* 3.6  CL 107 106  CO2 28 28  GLUCOSE 141* 128*  BUN 20 16  CREATININE 0.77 0.70  CALCIUM 7.7* 8.0*  MG 1.8 1.9  PHOS 3.2 3.1  PROT 5.1*  --   ALBUMIN 1.8*  --   AST 19  --   ALT 11  --   ALKPHOS 36*  --   BILITOT 0.6  --   PREALBUMIN 9.6*  --   TRIG 190*  --   Corrected Calcium = 9.5  Estimated Creatinine Clearance: 49.5 mL/min (by C-G formula based on Cr of 0.7).    Recent Labs  05/07/14 1814 05/07/14 2350 05/08/14 0623  GLUCAP 130* 155* 145*    Medical History: Past Medical History  Diagnosis Date  . Hematuria   . Tremor   . Hair loss   . Unspecified vitamin D deficiency   . Migraines   . Osteoporosis   . Osteoarthritis   . Allergic rhinitis   . Glaucoma     Medications:  Scheduled:  . acetaminophen (TYLENOL) oral liquid 160 mg/5 mL  1,000 mg Oral 3 times per day  . heparin subcutaneous  5,000 Units Subcutaneous 3 times per  day  . insulin aspart  0-9 Units Subcutaneous 4 times per day  . lip balm  1 application Topical BID  . morphine   Intravenous 6 times per day  . pantoprazole (PROTONIX) IV  40 mg Intravenous QHS  . piperacillin-tazobactam (ZOSYN)  IV  3.375 g Intravenous Q8H   Infusions:  . dextrose 5% lactated ringers 30 mL/hr (05/07/14 2145)  . Marland KitchenTPN (CLINIMIX-E) Adult 41 mL/hr at 05/07/14 1800   And  . fat emulsion 240 mL (05/07/14 1800)   PRN: alum & mag hydroxide-simeth, diphenhydrAMINE **OR** diphenhydrAMINE, lactated ringers, magic mouthwash, menthol-cetylpyridinium, metoprolol, naloxone **AND** sodium chloride, ondansetron **OR** ondansetron (ZOFRAN) IV, phenol, promethazine, sodium chloride  Insulin Requirements:  5 units Novolog SSI past 24h  Current Nutrition:  CLD started this AM Clinimix-E 5/15 at 41 mL/hr Fat Emulsion 20% at 10 mL/hr  IVF:  D-5-LR at 10 mL/hr  Central access:  PICC (placed 05/04/14)   TPN start date: 05/05/14  ASSESSMENT  HPI: 74 y/o F with perforated diverticulitis and multiple intra-abdominal abscesses; on 05/03/14 underwent exploratory laparotomy, sigmoid colectomy, drainage of abscesses, creation of descending colostomy.  Anticipate prolonged post-operative ileus.  Orders received to begin TPN with pharmacy dosing assistance.  Significant events:  2/24: PICC placed, but MD elected to provide a day of fluid resuscitation postoperatively before beginning TPN.  Today:   Glucose and CBGs - slightly elevated but < 180. Using small amounts of sliding-scale Novolog.  No history of DM.  Electrolytes - K improved to low end of nml, others WNL  Renal - SCr and BUN WNL  LFTs - all below ULN.  TGs - slightly elevated; no change needed to Fat Emulsion unless > 400.  Prealbumin - baseline 6.0 (2/23), 9.6 (2/26)  NUTRITIONAL GOALS                                                                                        RD recs: Kcal: 1400-1600/d Protein: 65-75 g/d Fluid: >/1.5 L/day   Clinimix-E 5/15 at a goal rate of 60 ml/hr + 20% fat emulsion at 10 ml/hr will provide: 72 g/day protein, 1502 Kcal/day.  PLAN                                                                                                                     1. At 1800 today:  Increase Clinimix-E 5/15 to 60 ml/hr (goal rate)  Continue 20% fat emulsion at 10 ml/hr. 2. TPN to contain standard multivitamins and trace elements. 3. Continue mIVF at 10 ml/hr per MD 4. Continue CBG q6h and cover with Novolog SSI sensitive-scale 5. Full TPN labs on Mondays & Thursdays 6. Follow clinical course daily.  Thank you for the consult.  Currie Paris, PharmD, BCPS Pager: [phone] Pharmacy: [phone] 05/08/2014 10:19 AM

## 2014-05-08 NOTE — Progress Notes (Signed)
[address] [place], [place] [address] Phone: [phone] FAX: [phone]    Kristen Castillo [id number] [birth year]  CARE TEAM:  PCP: Walker Kehr, MD  Outpatient Care Team: [name], [name], [name], [name], [name], [name], [name]  Inpatient Treatment Team: Treatment Team: Attending Provider: Nolon Nations, MD; Consulting Physician: Nolon Nations, MD; Technician: Delila Spence, NT; Technician: Julian Reil, NT; Technician: Leda Quail, NT; Technician: Jefm Bryant, NT; Registered Nurse: Mortimer Fries, RN; Registered Nurse: Eugenie Birks, RN; Technician: Rudi Heap, NT; Registered Nurse: Kristopher Oppenheim, RN   Subjective:  Walking in hallways PCA helps Daughter in room Denies nausea No major events  Objective:  Vital signs:  Filed Vitals:   05/07/14 1653 05/07/14 2000 05/07/14 2329 05/08/14 0639  BP:   110/36 131/52  Pulse:   80 71  Temp:   97.5 F (36.4 C) 97.9 F (36.6 C)  TempSrc:   Oral Oral  Resp: 16 18 14 13   Height:      Weight:      SpO2: 97% 99% 95% 97%    Last BM Date: 05/08/14  Intake/Output   Yesterday:  02/27 0701 - 02/28 0700 In: 945 [P.O.:420; NG/GT:125; IV Piggyback:400] Out: 7048 [Urine:1550; Drains:35; Stool:60] This shift:     Bowel function:  Flatus: YES  BM: YES  Drain: serossanguinous  Physical Exam:  General: Pt awake/alert/oriented x4 in no acute distress Eyes: PERRL, normal EOM.  Sclera clear.  No icterus Neuro: CN II-XII intact w/o focal sensory/motor deficits. Lymph: No head/neck/groin lymphadenopathy Psych:  No delerium/psychosis/paranoia HENT: Normocephalic, Mucus  membranes moist.  No thrush.  NGT w MINIMAL bilious fluid Neck: Supple, No tracheal deviation Chest: No chest wall pain w good excursion CV:  Pulses intact.  Regular rhythm MS: Normal AROM mjr joints.  No obvious deformity Abdomen: Soft.  Nondistended.  Mildly tender at incisions only.  Wound vac clean.  Colostomy pink.  +++ gas/ + stool.  No evidence of peritonitis.  No incarcerated hernias. Ext:  SCDs BLE.  No mjr edema.  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 female  5 Days Post-Op    PREOPERATIVE DX: Perforated diverticulitis with multiple intra-abdominal abscesses POSTOPERATIVE DX: Same  PROCEDURE: Diagnostic laparoscopy and drainage of right pelvic and left lower quadrant abscess. Exploratory laparotomy, sigmoid colectomy, drainage of left pelvic abscess, descending colostomy.    Recovering gradually  Plan:  -clamp NGT.  Try PO clears.  If tolerates clamping w PO x 12 hrs, d/c NGT & adv to pureed  -cont IV ABx POD #5/10 Zosyn.  Prob needs 10 days minimum.  -Hold on repeat CT scan POD #5-7 to r/o abscess unless ileus returns or WBC worse.  WBC up a liitle...  Follow for now.    -min IVF  -PCA until better  -TNA until ileus resolves.  Then  gradually wean off  -wound care - cont wound vac at incision & follow  -colostomy care/training.  WOCNs on board  -VTE prophylaxis- SCDs, etc  -mobilize as tolerated to help recovery - walking already a good sign  I updated the patient's status to the patient, her husband & daughter.  Recommendations were made.  Questions were answered.  The  patient expressed understanding & appreciation.   Adin Hector, M.D., F.A.C.S. Gastrointestinal and Minimally Invasive Surgery [facility] [address], [place], [place] [address] [phone] Main / Paging   05/08/2014   Results:   Labs: Results for orders placed or performed during the hospital encounter of 04/28/14 (from the past 48 hour(s))  Glucose, capillary     Status: Abnormal   Collection Time: 05/06/14 11:47 AM  Result Value Ref Range   Glucose-Capillary 135 (H) 70 - 99 mg/dL  Glucose, capillary     Status: Abnormal   Collection Time: 05/06/14  5:30 PM  Result Value Ref Range   Glucose-Capillary 142 (H) 70 - 99 mg/dL  Glucose, capillary     Status: Abnormal   Collection Time: 05/07/14 12:00 AM  Result Value Ref Range   Glucose-Capillary 157 (H) 70 - 99 mg/dL  CBC     Status: Abnormal   Collection Time: 05/07/14  4:05 AM  Result Value Ref Range   WBC 12.8 (H) 4.0 - 10.5 K/uL   RBC 3.03 (L) 3.87 - 5.11 MIL/uL   Hemoglobin 9.0 (L) 12.0 - 15.0 g/dL   HCT 27.5 (L) 36.0 - 46.0 %   MCV 90.8 78.0 - 100.0 fL   MCH 29.7 26.0 - 34.0 pg   MCHC 32.7 30.0 - 36.0 g/dL   RDW 13.8 11.5 - 15.5 %   Platelets 376 150 - 400 K/uL  Basic metabolic panel     Status: Abnormal   Collection Time: 05/07/14  4:05 AM  Result Value Ref Range   Sodium 138 135 - 145 mmol/L   Potassium 3.6 3.5 - 5.1 mmol/L   Chloride 106 96 - 112 mmol/L   CO2 28 19 - 32 mmol/L   Glucose, Bld 128 (H) 70 - 99 mg/dL   BUN 16 6 - 23 mg/dL   Creatinine, Ser 0.70 0.50 - 1.10 mg/dL   Calcium 8.0 (L) 8.4 - 10.5 mg/dL   GFR calc non Af Amer 84 (L) >90 mL/min   GFR calc Af Amer >90 >90 mL/min    Comment: (NOTE) The eGFR has been calculated using the CKD EPI equation. This calculation has not been validated in all clinical situations. eGFR's persistently <90 mL/min signify possible Chronic Kidney Disease.    Anion gap 4 (L) 5 - 15  Magnesium     Status: None    Collection Time: 05/07/14  4:05 AM  Result Value Ref Range   Magnesium 1.9 1.5 - 2.5 mg/dL  Phosphorus     Status: None   Collection Time: 05/07/14  4:05 AM  Result Value Ref Range   Phosphorus 3.1 2.3 - 4.6 mg/dL  Glucose, capillary     Status: Abnormal   Collection Time: 05/07/14  6:21 AM  Result Value Ref Range   Glucose-Capillary 146 (H) 70 - 99 mg/dL  Glucose, capillary     Status: Abnormal   Collection Time: 05/07/14  1:38 PM  Result Value Ref Range   Glucose-Capillary 144 (H) 70 - 99 mg/dL  Glucose, capillary     Status: Abnormal   Collection  Time: 05/07/14  6:14 PM  Result Value Ref Range   Glucose-Capillary 130 (H) 70 - 99 mg/dL  Glucose, capillary     Status: Abnormal   Collection Time: 05/07/14 11:50 PM  Result Value Ref Range   Glucose-Capillary 155 (H) 70 - 99 mg/dL  Glucose, capillary     Status: Abnormal   Collection Time: 05/08/14  6:23 AM  Result Value Ref Range   Glucose-Capillary 145 (H) 70 - 99 mg/dL    Imaging / Studies: No results found.  Medications / Allergies: per chart  Antibiotics: Anti-infectives    Start     Dose/Rate Route Frequency Ordered Stop   05/03/14 1600  piperacillin-tazobactam (ZOSYN) IVPB 3.375 g     3.375 g 12.5 mL/hr over 240 Minutes Intravenous Every 8 hours 05/03/14 1556     04/29/14 0400  piperacillin-tazobactam (ZOSYN) IVPB 3.375 g  Status:  Discontinued     3.375 g 12.5 mL/hr over 240 Minutes Intravenous 3 times per day 04/28/14 2103 04/28/14 2247   04/29/14 0400  piperacillin-tazobactam (ZOSYN) IVPB 3.375 g  Status:  Discontinued     3.375 g 12.5 mL/hr over 240 Minutes Intravenous 3 times per day 04/28/14 2250 05/03/14 1556   04/28/14 2100  metroNIDAZOLE (FLAGYL) IVPB 500 mg  Status:  Discontinued     500 mg 100 mL/hr over 60 Minutes Intravenous Every 6 hours 04/28/14 2049 04/28/14 2053   04/28/14 2045  piperacillin-tazobactam (ZOSYN) IVPB 3.375 g     3.375 g 100 mL/hr over 30 Minutes Intravenous  Once 04/28/14 2043  04/28/14 2133       Note: Portions of this report may have been transcribed using voice recognition software. Every effort was made to ensure accuracy; however, inadvertent computerized transcription errors may be present.   Any transcriptional errors that result from this process are unintentional.

## 2014-05-08 NOTE — Plan of Care (Signed)
Problem: Phase I Progression Outcomes Goal: Hemodynamically stable Outcome: Not Met (add Reason) DBP running low, other VSS

## 2014-05-09 LAB — COMPREHENSIVE METABOLIC PANEL
ALT: 42 U/L — AB (ref 0–35)
AST: 51 U/L — AB (ref 0–37)
Albumin: 2.2 g/dL — ABNORMAL LOW (ref 3.5–5.2)
Alkaline Phosphatase: 77 U/L (ref 39–117)
Anion gap: 6 (ref 5–15)
BUN: 20 mg/dL (ref 6–23)
CALCIUM: 8.2 mg/dL — AB (ref 8.4–10.5)
CO2: 24 mmol/L (ref 19–32)
Chloride: 106 mmol/L (ref 96–112)
Creatinine, Ser: 0.74 mg/dL (ref 0.50–1.10)
GFR calc Af Amer: >90 mL/min (ref >90)
GFR, EST NON AFRICAN AMERICAN: 82 mL/min — AB (ref >90)
GLUCOSE: 138 mg/dL — AB (ref 70–99)
POTASSIUM: 3.9 mmol/L (ref 3.5–5.1)
SODIUM: 136 mmol/L (ref 135–145)
Total Bilirubin: 0.6 mg/dL (ref 0.3–1.2)
Total Protein: 5.8 g/dL — ABNORMAL LOW (ref 6.0–8.3)

## 2014-05-09 LAB — TRIGLYCERIDES: Triglycerides: 193 mg/dL — ABNORMAL HIGH (ref <150)

## 2014-05-09 LAB — CBC
HEMATOCRIT: 28.7 % — AB (ref 36.0–46.0)
HEMOGLOBIN: 9.2 g/dL — AB (ref 12.0–15.0)
MCH: 29 pg (ref 26.0–34.0)
MCHC: 32.1 g/dL (ref 30.0–36.0)
MCV: 90.5 fL (ref 78.0–100.0)
Platelets: 508 10*3/uL — ABNORMAL HIGH (ref 150–400)
RBC: 3.17 MIL/uL — AB (ref 3.87–5.11)
RDW: 13.9 % (ref 11.5–15.5)
WBC: 11.8 10*3/uL — ABNORMAL HIGH (ref 4.0–10.5)

## 2014-05-09 LAB — PHOSPHORUS: Phosphorus: 2.7 mg/dL (ref 2.3–4.6)

## 2014-05-09 LAB — DIFFERENTIAL
Basophils Absolute: 0 10*3/uL (ref 0.0–0.1)
Basophils Relative: 0 % (ref 0–1)
EOS ABS: 0.4 10*3/uL (ref 0.0–0.7)
Eosinophils Relative: 4 % (ref 0–5)
LYMPHS ABS: 1.5 10*3/uL (ref 0.7–4.0)
LYMPHS PCT: 13 % (ref 12–46)
MONO ABS: 0.5 10*3/uL (ref 0.1–1.0)
MONOS PCT: 5 % (ref 3–12)
NEUTROS ABS: 9.3 10*3/uL — AB (ref 1.7–7.7)
Neutrophils Relative %: 78 % — ABNORMAL HIGH (ref 43–77)

## 2014-05-09 LAB — GLUCOSE, CAPILLARY
Glucose-Capillary: 140 mg/dL — ABNORMAL HIGH (ref 70–99)
Glucose-Capillary: 141 mg/dL — ABNORMAL HIGH (ref 70–99)
Glucose-Capillary: 144 mg/dL — ABNORMAL HIGH (ref 70–99)

## 2014-05-09 LAB — PREALBUMIN: PREALBUMIN: 16.5 mg/dL — AB (ref 17.0–34.0)

## 2014-05-09 MED ORDER — MORPHINE SULFATE 2 MG/ML IJ SOLN
1.0000 mg | INTRAMUSCULAR | Status: DC | PRN
  Administered 2014-05-09 (×2): 2 mg via INTRAVENOUS
  Filled 2014-05-09 (×2): qty 1

## 2014-05-09 MED ORDER — INSULIN ASPART 100 UNIT/ML ~~LOC~~ SOLN
0.0000 [IU] | Freq: Three times a day (TID) | SUBCUTANEOUS | Status: DC
  Administered 2014-05-09 – 2014-05-10 (×2): 1 [IU] via SUBCUTANEOUS

## 2014-05-09 MED ORDER — HYDROCODONE-ACETAMINOPHEN 5-325 MG PO TABS
1.0000 | ORAL_TABLET | ORAL | Status: DC | PRN
  Administered 2014-05-10 (×2): 1 via ORAL
  Filled 2014-05-09 (×2): qty 1

## 2014-05-09 MED ORDER — TRACE MINERALS CR-CU-F-FE-I-MN-MO-SE-ZN IV SOLN
INTRAVENOUS | Status: DC
  Administered 2014-05-09: 18:00:00 via INTRAVENOUS
  Filled 2014-05-09: qty 960

## 2014-05-09 MED ORDER — FAT EMULSION 20 % IV EMUL
240.0000 mL | INTRAVENOUS | Status: DC
  Administered 2014-05-09: 240 mL via INTRAVENOUS
  Filled 2014-05-09: qty 250

## 2014-05-09 MED ORDER — ENSURE COMPLETE PO LIQD
237.0000 mL | Freq: Two times a day (BID) | ORAL | Status: DC
  Administered 2014-05-09 – 2014-05-11 (×2): 237 mL via ORAL

## 2014-05-09 NOTE — Progress Notes (Signed)
NUTRITION FOLLOW UP  Intervention:   - Wean TPN per pharmacy - Ensure Complete po BID, each supplement provides 350 kcal and 13 grams of protein - RD will continue to monitor  Nutrition Dx:   Inadequate oral intake related to inability to eat as evidenced by NPO status; ongoing.  Goal:   Pt to meet >/= 90% of estimated needs; not met.  Monitor:   Pt TPN tolerance, weight trends, labs, acceptance of supplements  Assessment:   Pt with no significant past medical history presents with diverticulitis of colon with perforation. Consulted for TPN.   Per weight records and patient weight has been stable. Pt reports not change in appetite. Pt reports exercising 75 minutes everyday.   Nutrition Focus Physical Exam showed no signs of fat or muscle wasting.   2/29: Pt asleep during RD visit.  Per nursing student, pt is tolerating ice chips and broth. Current diet is Dysphagia I and pt is to order a meal once she wakes up. Will order nutritional supplements to improve po intake. Pt having flatus/BM. TPN to be weaned per pharmacy.   Per pharmacy At 1800 today:  Reduce Clinimix-E 5/15 to 40 ml/hr - note TPN can be stopped at this rate tomorrow if is going to be d/c'd  Continue 20% fat emulsion at 10 ml/hr.  Labs reviewed  Height: Ht Readings from Last 1 Encounters:  04/29/14 5' 2"  (1.575 m)    Weight Status:   Wt Readings from Last 1 Encounters:  05/06/14 115 lb 8.3 oz (52.4 kg)    Re-estimated needs:  Kcal: 1400-1600 kcal Protein: 65-75 g protein Fluid: >/1.5 L/day  Skin: Incision on abdomen  Diet Order: TPN (CLINIMIX-E) Adult DIET - DYS 1 .TPN (CLINIMIX-E) Adult   Intake/Output Summary (Last 24 hours) at 05/09/14 1406 Last data filed at 05/09/14 1000  Gross per 24 hour  Intake 1356.5 ml  Output   1720 ml  Net -363.5 ml    Last BM: 2/23   Labs:   Recent Labs Lab 05/06/14 0425 05/07/14 0405 05/09/14 0450  NA 139 138 136  K 3.3* 3.6 3.9  CL 107  106 106  CO2 28 28 24   BUN 20 16 20   CREATININE 0.77 0.70 0.74  CALCIUM 7.7* 8.0* 8.2*  MG 1.8 1.9  --   PHOS 3.2 3.1 2.7  GLUCOSE 141* 128* 138*    CBG (last 3)   Recent Labs  05/08/14 2347 05/09/14 0600 05/09/14 1205  GLUCAP 141* 140* 141*    Scheduled Meds: . acetaminophen (TYLENOL) oral liquid 160 mg/5 mL  1,000 mg Oral 3 times per day  . heparin subcutaneous  5,000 Units Subcutaneous 3 times per day  . insulin aspart  0-9 Units Subcutaneous TID WC  . lip balm  1 application Topical BID  . pantoprazole (PROTONIX) IV  40 mg Intravenous QHS  . piperacillin-tazobactam (ZOSYN)  IV  3.375 g Intravenous Q8H    Continuous Infusions: . Marland KitchenTPN (CLINIMIX-E) Adult     And  . fat emulsion    . dextrose 5% lactated ringers 10 mL/hr (05/08/14 0828)  . Marland KitchenTPN (CLINIMIX-E) Adult 60 mL/hr at 05/08/14 1730   And  . fat emulsion 240 mL (05/08/14 1730)   Laurette Schimke [place], [place], [place]

## 2014-05-09 NOTE — Progress Notes (Signed)
[facility] Progress Note  6 Days Post-Op  Subjective: Pt anxious and asks many questions.  No N/V, she says she feels great.  Having a lot of rumbling in her stomach and flatus/BM.  Husband at bedside.  Ambulating well OOB.  Urinating well.    Objective: Vital signs in last 24 hours: Temp:  [97.7 F (36.5 C)-98 F (36.7 C)] 98 F (36.7 C) (02/29 0626) Pulse Rate:  [74-83] 75 (02/29 0626) Resp:  [10-17] 12 (02/29 0626) BP: (121-130)/(44-54) 130/48 mmHg (02/29 0626) SpO2:  [94 %-99 %] 95 % (02/29 0626) Last BM Date: 05/08/14  Intake/Output from previous day: 02/28 0701 - 02/29 0700 In: 1872.7 [I.V.:236.2; IV Piggyback:150; TPN:1486.5] Out: 1970 [JGOTL:5726; Emesis/NG output:10; Drains:10] Intake/Output this shift:    PE: Gen:  Alert, NAD, pleasant Abd: Soft, minimal tenderness, ND, +BS, no HSM, wound vac in place on midline wound, JP drain in RLQ with minimal serosanguinous drainage   Lab Results:   Recent Labs  05/07/14 0405 05/09/14 0450  WBC 12.8* 11.8*  HGB 9.0* 9.2*  HCT 27.5* 28.7*  PLT 376 508*   BMET  Recent Labs  05/07/14 0405 05/09/14 0450  NA 138 136  K 3.6 3.9  CL 106 106  CO2 28 24  GLUCOSE 128* 138*  BUN 16 20  CREATININE 0.70 0.74  CALCIUM 8.0* 8.2*   PT/INR No results for input(s): LABPROT, INR in the last 72 hours. CMP     Component Value Date/Time   NA 136 05/09/2014 0450   K 3.9 05/09/2014 0450   CL 106 05/09/2014 0450   CO2 24 05/09/2014 0450   GLUCOSE 138* 05/09/2014 0450   BUN 20 05/09/2014 0450   CREATININE 0.74 05/09/2014 0450   CALCIUM 8.2* 05/09/2014 0450   PROT 5.8* 05/09/2014 0450   ALBUMIN 2.2* 05/09/2014 0450   AST 51* 05/09/2014 0450   ALT 42* 05/09/2014 0450   ALKPHOS 77 05/09/2014 0450   BILITOT 0.6 05/09/2014 0450   GFRNONAA 82* 05/09/2014 0450   GFRAA >90 05/09/2014 0450   Lipase     Component Value Date/Time   LIPASE 26 04/28/2014 1745       Studies/Results: No results  found.  Anti-infectives: Anti-infectives    Start     Dose/Rate Route Frequency Ordered Stop   05/03/14 1600  piperacillin-tazobactam (ZOSYN) IVPB 3.375 g     3.375 g 12.5 mL/hr over 240 Minutes Intravenous Every 8 hours 05/03/14 1556     04/29/14 0400  piperacillin-tazobactam (ZOSYN) IVPB 3.375 g  Status:  Discontinued     3.375 g 12.5 mL/hr over 240 Minutes Intravenous 3 times per day 04/28/14 2103 04/28/14 2247   04/29/14 0400  piperacillin-tazobactam (ZOSYN) IVPB 3.375 g  Status:  Discontinued     3.375 g 12.5 mL/hr over 240 Minutes Intravenous 3 times per day 04/28/14 2250 05/03/14 1556   04/28/14 2100  metroNIDAZOLE (FLAGYL) IVPB 500 mg  Status:  Discontinued     500 mg 100 mL/hr over 60 Minutes Intravenous Every 6 hours 04/28/14 2049 04/28/14 2053   04/28/14 2045  piperacillin-tazobactam (ZOSYN) IVPB 3.375 g     3.375 g 100 mL/hr over 30 Minutes Intravenous  Once 04/28/14 2043 04/28/14 2133       Assessment/Plan Perforated diverticulitis with multiple intra-abdominal abscesses POD #6 s/p diagnostic lap with drainage of right pelvic and left lower abscess, Ex Lap, sigmoid colectomy, drainage of left pelvic abscess and descending colostomy -Tolerated clamping trials well, d/c NG and start pureed  diet -IV abx #6/10 Zosyn -Consider repeat CT at some point if ileus does not resolve or WBC worsens (down today to 11.8) -d/c PCA, transition to IV push and orals if tolerating diet -Continue wound vac MWF -WOC nursing, ostomy teaching -Ambulate and IS (up to 1500) PCM -TNA can start to wean to 1/2 dosage tonight VTE proph -SCD's and heparin SQ Disp -D/c home when tolerating solid diet with [facility] services    LOS: 11 days    DORT, MEGAN 05/09/2014, 7:45 AM Pager: [phone]  Agree with above. Looks good.  Her NGT is out and she has a lot in the ostomy bag. Her husband is at the bed side.  Alphonsa Overall, MD, [facility] Pager: [phone] Office phone:   [phone]

## 2014-05-09 NOTE — Progress Notes (Signed)
PARENTERAL NUTRITION CONSULT NOTE   Pharmacy Consult for TPN Indication: Perforated diverticulitis with multiple intra-abdominal abscesses; s/p laparotomy 05/03/14; anticipate prolonged post-operative ileus.   Allergies  Allergen Reactions  . Codeine Nausea And Vomiting  . Sulfonamide Derivatives Other (See Comments)    Childhood allergy; reaction unknown    Patient Measurements: Height: 5\' 2"  (157.5 cm) (per pt) Weight: 115 lb 8.3 oz (52.4 kg) IBW/kg (Calculated) : 50.1 . Vital Signs: Temp: 98 F (36.7 C) (02/29 0626) Temp Source: Oral (02/29 0626) BP: 127/55 mmHg (02/29 0926) Pulse Rate: 85 (02/29 0926) Intake/Output from previous day: 02/28 0701 - 02/29 0700 In: 1872.7 [I.V.:236.2; IV Piggyback:150; TPN:1486.5] Out: 1970 [KTGYB:6389; Emesis/NG output:10; Drains:10] Intake/Output from this shift: Total I/O In: -  Out: 450 [Urine:450]  Labs:  Recent Labs  05/07/14 0405 05/09/14 0450  WBC 12.8* 11.8*  HGB 9.0* 9.2*  HCT 27.5* 28.7*  PLT 376 508*     Recent Labs  05/07/14 0405 05/09/14 0450 05/09/14 0451  NA 138 136  --   K 3.6 3.9  --   CL 106 106  --   CO2 28 24  --   GLUCOSE 128* 138*  --   BUN 16 20  --   CREATININE 0.70 0.74  --   CALCIUM 8.0* 8.2*  --   MG 1.9  --   --   PHOS 3.1 2.7  --   PROT  --  5.8*  --   ALBUMIN  --  2.2*  --   AST  --  51*  --   ALT  --  42*  --   ALKPHOS  --  77  --   BILITOT  --  0.6  --   TRIG  --   --  193*   Estimated Creatinine Clearance: 49.5 mL/min (by C-G formula based on Cr of 0.74).    Recent Labs  05/08/14 1846 05/08/14 2347 05/09/14 0600  GLUCAP 156* 141* 140*    Medical History: Past Medical History  Diagnosis Date  . Hematuria   . Tremor   . Hair loss   . Unspecified vitamin D deficiency   . Migraines   . Osteoporosis   . Osteoarthritis   . Allergic rhinitis   . Glaucoma     Medications:  Scheduled:  . acetaminophen (TYLENOL) oral liquid 160 mg/5 mL  1,000 mg Oral 3 times per day   . heparin subcutaneous  5,000 Units Subcutaneous 3 times per day  . insulin aspart  0-9 Units Subcutaneous 4 times per day  . lip balm  1 application Topical BID  . pantoprazole (PROTONIX) IV  40 mg Intravenous QHS  . piperacillin-tazobactam (ZOSYN)  IV  3.375 g Intravenous Q8H   Infusions:  . dextrose 5% lactated ringers 10 mL/hr (05/08/14 0828)  . Marland KitchenTPN (CLINIMIX-E) Adult 60 mL/hr at 05/08/14 1730   And  . fat emulsion 240 mL (05/08/14 1730)   PRN: alum & mag hydroxide-simeth, HYDROcodone-acetaminophen, magic mouthwash, menthol-cetylpyridinium, metoprolol, morphine injection, ondansetron **OR** ondansetron (ZOFRAN) IV, phenol, promethazine, sodium chloride, sodium chloride  Insulin Requirements:  5 units Novolog SSI past 24h  Current Nutrition:  Pureed diet started this AM Clinimix-E 5/15 at 60 mL/hr Fat Emulsion 20% at 10 mL/hr  IVF:  D-5-LR at 10 mL/hr  Central access:  PICC (placed 05/04/14)   TPN start date: 05/05/14  ASSESSMENT  HPI: 74 y/o F with perforated diverticulitis and multiple intra-abdominal abscesses; on 05/03/14 underwent exploratory laparotomy, sigmoid colectomy, drainage of abscesses, creation of descending colostomy.  Anticipate prolonged post-operative ileus.  Orders received to begin TPN with pharmacy dosing assistance.  Significant events:  2/24: PICC placed, but MD elected to provide a day of fluid resuscitation postoperatively before beginning TPN. 2/29: d/c NGT after clamping trials, advance diet and start weaning TPN per [facility] note  Today:   Glucose and CBGs - majority <150 (125-156). No history of DM.  Electrolytes - K improved to low end of nml, others WNL  Renal - SCr and BUN WNL  LFTs - increased to slightly above normal limits this AM.  TGs - slightly elevated and stable since last checked; no change needed to Fat Emulsion unless >  400.  Prealbumin - baseline 6.0 (2/23), 9.6 (2/26)  NUTRITIONAL GOALS                                                                                       RD recs: Kcal: 1400-1600/d Protein: 65-75 g/d Fluid: >/1.5 L/day  Clinimix-E 5/15 at a goal rate of 60 ml/hr + 20% fat emulsion at 10 ml/hr will provide: 72 g/day protein, 1502 Kcal/day.  PLAN                                                                                                                     1. At 1800 today:  Reduce Clinimix-E 5/15 to 40 ml/hr - note TPN can be stopped at this rate tomorrow if is going to be d/c'd  Continue 20% fat emulsion at 10 ml/hr. 2. TPN to contain standard multivitamins and trace elements. 3. Continue mIVF at 10 ml/hr per MD 4. Change CBG/SSI to with meals 5. Full TPN labs on Mondays & Thursdays 6. Follow clinical course daily.  Adrian Saran, PharmD, BCPS Pager [phone] 05/09/2014 11:30 AM

## 2014-05-09 NOTE — Consult Note (Signed)
WOC wound follow up Wound type: Midline surgical Measurement:per Friday Wound bed:red, moist Drainage (amount, consistency, odor) scant serous Periwound:intact Dressing procedure/placement/frequency: 1 piece of black foam used to fill defect.  Drape applied and an immediate seal is achieved.  146mmHg negative continuous pressure in place.  Patient tolerated procedure well.  Husband at bedside.   WOC ostomy follow up Stoma type/location: LLQ colostomy Stomal assessment/size: 1 and 1/2 inches  Peristomal assessment: intact.   Treatment options for stomal/peristomal skin: Skin barrier ring Output brown green effluent.  Loose. Ostomy pouching:2pc., 2 and 1/4 inch pouching system with skin barrier ring.   Education provided: Patient and husband participating in pouching change session today.  Patient instructed on pouch emptying and cleansing bottom 2 inches of pouch demonstrated using toilet paper wicks.  Husband to make a few for patient's use later today.  Nursing technician will practice with patient in bathroom today.  Goal is for independence in emptying by Wednesday.  As paitent has a NPWT device in place necessitating three times a week changes, caregiver independence in pouch change may be delayed.  HHRN will assist with pouch change simultaneously with NPWT dressing change. Enrolled patient in [employer] Discharge program: Yes   Thanks, Maudie Flakes, MSN, RN, [place], [place], [place] [phone])

## 2014-05-10 LAB — CBC
HCT: 29.6 % — ABNORMAL LOW (ref 36.0–46.0)
HEMOGLOBIN: 9.6 g/dL — AB (ref 12.0–15.0)
MCH: 29.4 pg (ref 26.0–34.0)
MCHC: 32.4 g/dL (ref 30.0–36.0)
MCV: 90.5 fL (ref 78.0–100.0)
PLATELETS: 615 10*3/uL — AB (ref 150–400)
RBC: 3.27 MIL/uL — ABNORMAL LOW (ref 3.87–5.11)
RDW: 14.1 % (ref 11.5–15.5)
WBC: 10.8 10*3/uL — ABNORMAL HIGH (ref 4.0–10.5)

## 2014-05-10 LAB — GLUCOSE, CAPILLARY
GLUCOSE-CAPILLARY: 109 mg/dL — AB (ref 70–99)
Glucose-Capillary: 138 mg/dL — ABNORMAL HIGH (ref 70–99)
Glucose-Capillary: 137 mg/dL — ABNORMAL HIGH (ref 70–99)
Glucose-Capillary: 169 mg/dL — ABNORMAL HIGH (ref 70–99)

## 2014-05-10 MED ORDER — BOOST / RESOURCE BREEZE PO LIQD
1.0000 | Freq: Two times a day (BID) | ORAL | Status: DC
  Administered 2014-05-11: 1 via ORAL

## 2014-05-10 NOTE — Progress Notes (Signed)
[facility] Progress Note  7 Days Post-Op  Subjective: Pt feels good.  No N/V or abdominal pain.  Tolerating puree diet well.  Having good ostomy output and flatus.  Starting to help with ostomy emptying and changes.   Objective: Vital signs in last 24 hours: Temp:  [98 F (36.7 C)-98.6 F (37 C)] 98 F (36.7 C) (03/01 0603) Pulse Rate:  [77-89] 77 (03/01 0603) Resp:  [14-20] 14 (03/01 0603) BP: (121-143)/(55-63) 141/63 mmHg (03/01 0603) SpO2:  [96 %-100 %] 99 % (03/01 0603) Last BM Date: 05/09/14  Intake/Output from previous day: 02/29 0701 - 03/01 0700 In: 965.8 [I.V.:120.8; IV Piggyback:50; TOI:712] Out: 2075 [Urine:2070; Drains:5] Intake/Output this shift:    PE: Gen:  Alert, NAD, pleasant Abd: Soft, minimal tenderness, ND, +BS, no HSM, wound vac in place on midline wound, JP drain in RLQ with minimal serosanguinous drainage  Lab Results:   Recent Labs  05/09/14 0450 05/10/14 0500  WBC 11.8* 10.8*  HGB 9.2* 9.6*  HCT 28.7* 29.6*  PLT 508* 615*   BMET  Recent Labs  05/09/14 0450  NA 136  K 3.9  CL 106  CO2 24  GLUCOSE 138*  BUN 20  CREATININE 0.74  CALCIUM 8.2*   PT/INR No results for input(s): LABPROT, INR in the last 72 hours. CMP     Component Value Date/Time   NA 136 05/09/2014 0450   K 3.9 05/09/2014 0450   CL 106 05/09/2014 0450   CO2 24 05/09/2014 0450   GLUCOSE 138* 05/09/2014 0450   BUN 20 05/09/2014 0450   CREATININE 0.74 05/09/2014 0450   CALCIUM 8.2* 05/09/2014 0450   PROT 5.8* 05/09/2014 0450   ALBUMIN 2.2* 05/09/2014 0450   AST 51* 05/09/2014 0450   ALT 42* 05/09/2014 0450   ALKPHOS 77 05/09/2014 0450   BILITOT 0.6 05/09/2014 0450   GFRNONAA 82* 05/09/2014 0450   GFRAA >90 05/09/2014 0450   Lipase     Component Value Date/Time   LIPASE 26 04/28/2014 1745       Studies/Results: No results found.  Anti-infectives: Anti-infectives    Start     Dose/Rate Route Frequency Ordered Stop   05/03/14 1600   piperacillin-tazobactam (ZOSYN) IVPB 3.375 g     3.375 g 12.5 mL/hr over 240 Minutes Intravenous Every 8 hours 05/03/14 1556     04/29/14 0400  piperacillin-tazobactam (ZOSYN) IVPB 3.375 g  Status:  Discontinued     3.375 g 12.5 mL/hr over 240 Minutes Intravenous 3 times per day 04/28/14 2103 04/28/14 2247   04/29/14 0400  piperacillin-tazobactam (ZOSYN) IVPB 3.375 g  Status:  Discontinued     3.375 g 12.5 mL/hr over 240 Minutes Intravenous 3 times per day 04/28/14 2250 05/03/14 1556   04/28/14 2100  metroNIDAZOLE (FLAGYL) IVPB 500 mg  Status:  Discontinued     500 mg 100 mL/hr over 60 Minutes Intravenous Every 6 hours 04/28/14 2049 04/28/14 2053   04/28/14 2045  piperacillin-tazobactam (ZOSYN) IVPB 3.375 g     3.375 g 100 mL/hr over 30 Minutes Intravenous  Once 04/28/14 2043 04/28/14 2133       Assessment/Plan Perforated diverticulitis with multiple intra-abdominal abscesses POD #7 s/p diagnostic lap with drainage of right pelvic and left lower abscess, Ex Lap, sigmoid colectomy, drainage of left pelvic abscess and descending colostomy -Start soft diet -IV abx #7/10 Zosyn  -Consider repeat CT at some point if ileus does not resolve or WBC worsens (down today to 10.8) -IV push and  orals for pain -Continue wound vac MWF -WOC nursing, ostomy teaching -Ambulate and IS (up to 1500) -D/c JP drain today? PCM -TNA discontinue tonight, asked dietitian to evaluate and give diet nutrition VTE proph -SCD's and heparin SQ Disp -D/c home tomorrow if all goes well with [facility] services, will discuss with Dr. Redmond Pulling the length of antibiotic treatment at discharge    LOS: 12 days    DORT, [facility] 05/10/2014, 7:12 AM Pager: [phone]

## 2014-05-10 NOTE — Discharge Summary (Signed)
[facility] Discharge Summary   Patient ID: Kristen Castillo MRN: [id number] DOB/AGE: 1940/06/25 74 y.o.  Admit date: 04/28/2014 Discharge date: 05/11/2014  Admitting Diagnosis: Acute perforated diverticulitis with multiple intra-abdominal abscesses   Discharge Diagnosis Patient Active Problem List   Diagnosis Date Noted  . Mild malnutrition 05/08/2014  . Abdominal pain   . Dehydration   . Migraine without aura and without status migrainosus, not intractable   . Sigmoid diverticulitis 05/03/2014  . Difficulty tolerating colonoscopy bowel prep 04/29/2014  . Anxiety, mild 04/29/2014  . 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 Diverticulitis of colon with perforation 04/28/2014  . GERD (gastroesophageal reflux disease) 04/28/2014  . Cerumen impaction 03/21/2014  . Cough 03/21/2014  . Well adult exam 03/21/2014  . 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 Rectal discharge 09/07/2011  . Constipation - functional 09/07/2011  . Nephrolithiasis 01/08/2011  . Diverticulosis of colon 01/08/2011  . HEMATURIA, MICROSCOPIC, HX OF 01/28/2008  . Vitamin D deficiency 04/01/2007  . HAIR LOSS 04/01/2007  . TREMOR 04/01/2007  . Allergic rhinitis 03/31/2007  . OSTEOARTHRITIS 03/31/2007  . MIGRAINES, HX OF 03/31/2007  . Osteoporosis 11/12/2006    Consultants WOC nursing Dr. Hal Hope - Internal medicine Dietitian   Imaging: No results found.   Procedures Dr. Zella Richer (05/03/14) - Diagnostic laparoscopy and drainage of right pelvic and left lower quadrant abscess. Exploratory laparotomy, sigmoid colectomy, drainage of left pelvic abscess, descending colostomy.   Hospital Course:  Elderly 74 y/o white female with history of diverticulosis by prior colonoscopies. Has had occasional irregular bowels but not severe. Dwindling tolerance to oral bowel prep, so she has held off on a follow-up colonoscopy in 7 years. Eating pistachio nuts last weekend. Had  a little mild discomfort for a few days. Then had definite intense lower abdominal pain at "2:06pm" today. The patient does not recall any prior episodes of diverticulitis. She recalls having kidney stones and wondered if she had an attack. Because the pain was rather intense, she came to the emergency room. Evidence of abdominal pain. CT scan concerning for diverticulitis with microperforation and numerous regions. Not hemodynamically in shock. Surgical consultation requested. Recommendation made for hospitalization given perforation.  Patient is rather physically active and exercises regularly. No history of cardiopulmonary issues. Usually has a bowel movement every other day to a few a day. No severe episodes of bleeding. She has had hysterectomy and oophrectomy. These were in the 1970s. No other abdominal surgery. No history of Crohn's nor ulcerative colitis. Possible history of colon polyps has been recommended to have colonoscopy every 5 years. No history of sick contacts or recent travel history. No history of C. difficile. No history of colitis. No hematochezia or melena. She did have some nausea and retching with this abdominal pain and felt rather dehydrated. Since she received IV fluids, she is feeling better.   Patient was admitted and was transferred to the floor.  Conservative management including antibiotics did not significantly improve the patients condition.  A repeat CT showed worsening free air and multiple intra-abdominal abscesses.  Therefore, the decision to proceed with surgical intervention was made.  Post-operatively she required aggressive fluid resuscitation.  She was started on TPN due to malnutrition.  She continued to be on IV Zosyn post-operatively for 10 additional days.  Post-operative ileus soon resolved and TPN was weaned to off as diet was advanced.  [facility] nursing met with the patient and did ostomy teaching and dressing changes.  She was switched from  WD dressings to a wound vac M/W/F.  WBC continued to improve.  JP drain became serosanguinous and was removed prior to discharge.  On HD#13, the patient was voiding well, tolerating diet, ambulating well, pain well controlled, vital signs stable, incisions c/d/i and felt stable for discharge home.  Patient will follow up in our office in 2-3 weeks and knows to call with questions or concerns.     Physical Exam: General:  Alert, NAD, pleasant, comfortable Abd:  Soft, ND, minimal tenderness, incisions C/D/I, wound vac in place, serosanguinous drainage from wound vac, JP drain with minimal serous drainage (now removed), colostomy pink with good stool output in bag     Medication List    STOP taking these medications        naproxen sodium 220 MG tablet  Commonly known as:  ANAPROX      TAKE these medications        amoxicillin-clavulanate 875-125 MG per tablet  Commonly known as:  AUGMENTIN  Take 1 tablet by mouth 2 (two) times daily.     ergocalciferol 50000 UNITS capsule  Commonly known as:  VITAMIN D2  Take 1 capsule (50,000 Units total) by mouth once a week.     HYDROcodone-acetaminophen 5-325 MG per tablet  Commonly known as:  NORCO/VICODIN  Take 1-2 tablets by mouth every 6 (six) hours as needed for moderate pain or severe pain.     ondansetron 4 MG disintegrating tablet  Commonly known as:  ZOFRAN ODT  Take 1 tablet (4 mg total) by mouth every 8 (eight) hours as needed for nausea or vomiting.     oxymetazoline 0.05 % nasal spray  Commonly known as:  AFRIN  Place 1 spray into both nostrils daily as needed for congestion.     Vitamin D3 2000 UNITS capsule  Take 1 capsule (2,000 Units total) by mouth daily.         Follow-up Information    Follow up with [facility].   Why:  nurse and aide   Contact information:   [address] [place] [address] [phone]       Follow up with ROSENBOWER,TODD J, MD In 3 weeks.   Specialty:   General Surgery   Why:  For post-operation check   Contact information:   [address] [place] [address] [phone]       Follow up with Walker Kehr, MD. Schedule an appointment as soon as possible for a visit in 2 weeks.   Specialty:  Internal Medicine   Why:  For post-hospital follow up   Contact information:   [place] [address] [phone]       Signed: [facility] [phone]  05/11/2014, 9:22 AM

## 2014-05-10 NOTE — Discharge Instructions (Signed)
Ostomy Support Information  Yes, Theresia Majors heard that people get along just fine with only one of their eyes, or one of their lungs, or one of their kidneys. But you also know that you have only one intestine and only one bladder, and that leaves you feeling awfully empty, both physically and emotionally: You think no other people go around without part of their intestine with the ends of their intestines sticking out through their abdominal walls.  Well, you are wrong! There are nearly three quarters of a million people in the [name] who have an ostomy; people who have had surgery to remove all or part of their colons or bladders. There is even a national association, the [place] [employer] [place] with over 350 local affiliated support groups that are organized by volunteers who provide peer support and counseling. Kristen Castillo has a toll free telephone num-ber, ([phone] and an educational,  interactive website, www.ostomy.org   An ostomy is an opening in the belly (abdominal wall) made by surgery. Ostomates are people who have had this procedure. The opening (stoma) allows the kidney or bowel to discharge waste. An external pouch covers the stoma to collect waste. Pouches are are a simple bag and are odor free. Different companies have disposable or reusable pouches to fit one's lifestyle. An ostomy can either be temporary or permanent.  THERE ARE THREE MAIN TYPES OF OSTOMIES  Colostomy. A colostomy is a surgically created opening in the large intestine (colon).  Ileostomy. An ileostomy is a surgically created opening in the small intestine.  Urostomy. A urostomy is a surgically created opening to divert urine away from the bladder. FREQUENTLY ASKED QUESTIONS   Why havent you met any of these folks who have an ostomy?  Well, maybe you have! You just did not recognize them because an ostomy doesn't show. It can be kept secret if you wish. Why, maybe some of your best friends, office associates  or neighbors have an ostomy ... you never can tell.   People facing ostomy surgery have many quality-of-life questions like:  Will you bulge? Smell? Make noises? Will you feel waste leaving your body? Will you be a captive of the toilet? Will you starve? Be a social outcast? Get/stay married? Have babies? Easily bathe, go swimming, bend over?  OK, lets look at what you can expect:  Will you bulge?  Remember, without part of the intestine or bladder, and its contents, you should have a flatter tummy than before. You can expect to wear, with little exception, what you wore before surgery ... and this in-cludes tight clothing and bathing suits.  Will you smell?  Today, thanks to modern odor proof pouching systems, you can walk into an ostomy support group meeting and not smell anything that is foul or offensive. And, for those with an ileostomy or colostomy who are concerned about odor when emptying their pouch, there are in-pouch deodorants that can be used to eliminate any waste odors that may exist.  Will you make noises?  Everyone produces gas, especially if they are an air-swallower. But intestinal sounds that occur from time to time are no differ-ent than a gurgling tummy, and quite often your clothing will muffle any sounds.   Will you feel the waste discharges?  For those with a colostomy or ileostomy there might be a slight pressure when waste leaves your body, but understand that the intestines have no nerve endings, so there will be no unpleasant sensations. Those with a urostomy will  probably be unaware of any kidney drainage.  Will you be a captive of the toilet?  Immediately post-op you will spend more time in the bathroom than you will after your body recovers from surgery. Every person is different, but on average those with an ileostomy or urostomy may empty their pouches 4 to 6 times a day; a little  less if you have a colostomy. The average wear time between pouch system changes is 3  to 5 days and the changing process should take less than 30 minutes.  Will I need to be on a special diet? Most people return to their normal diet when they have recovered from surgery. Be sure to chew your food well, eat a well-balanced diet and drink plenty of fluids. If you experience problems with a certain food, wait a couple of weeks and try it again. Will there be odor and noises? Pouching systems are designed to be odor-proof or odor-resistant. There are deodorants that can be used in the pouch. Medications are also available to help reduce odor. Limit gas-producing foods and carbonated beverages. You will experience less gas and fewer noises as you heal from surgery. How much time will it take to care for my ostomy? At first, you may spend a lot of time learning about your ostomy and how to take care of it. As you become more comfortable and skilled at changing the pouching system, it will take very little time to care for it.  Will I be able to return to work? People with ostomies can perform most jobs. As soon as you have healed from surgery, you should be able to return to work. Heavy lifting (more than 10 pounds) may be discouraged.  What about intimacy? Sexual relationships and intimacy are important and fulfilling aspects of your life. They should continue after ostomy surgery. Intimacy-related concerns should be discussed openly between you and your partner.  Can I wear regular clothing? You do not need to wear special clothing. Ostomy pouches are fairly flat and barely noticeable. Elastic undergarments will not hurt the stoma or prevent the ostomy from functioning.  Can I participate in sports? An ostomy should not limit your involvement in sports. Many people with ostomies are runners, skiers, swimmers or participate in other active lifestyles. Talk with your caregiver first before doing heavy physical activity.  Will you starve?  Not if you follow doctors orders at each stage of  your post-op adjustment. There is no such thing as an ostomy diet. Some people with an ostomy will be able to eat and tolerate anything; others may find diffi-culty with some foods. Each person is an individual and must determine, by trial, what is best for them. A good practice for all is to drink plenty of water.  Will you be a social outcast?  Have you met anyone who has an ostomy and is a social outcast? Why should you be the first? Only your attitude and self image will effect how you are treated. No confi-dent person is an Occupational psychologist.   PROFESSIONAL HELP  Resources are available if you need help or have questions about your ostomy.    Specially trained nurses called Wound, Ostomy Continence Nurses (WOCN) are available for consultation in most major medical centers.   Consider getting an ostomy consult with Kristen Castillo at [facility] to help troubleshoot stoma pouch fittings and other issues with your ostomy: [phone]   The [employer] (UOA) is a group made up of many  local chapters throughout the [place]. These local groups hold meetings and provide support to prospective and existing ostomates. They sponsor educational events and have qualified visitors to make personal or telephone visits. Contact the UOA for the chapter nearest you and for other educational publications.  More detailed information can be found in Colostomy Guide, a publication of the [employer] (UOA). Contact UOA at [phone] or visit their web site at [email or web address] The website contains links to other sites, suppliers and resources. Document Released: 02/28/2003 Document Revised: 05/20/2011 Document Reviewed: 06/29/2008 ExitCare Patient Information 2013 [place]  It is important that the wound be kept open.   -Keeping the skin edges apart will allow the wound to gradually heal from the base upwards.   - If the skin edges of the wound close  too early, a new fluid pocket can form and infection can occur. -This is the reason to pack deeper wounds with gauze or ribbon -This is why drained wounds cannot be sewed closed right away  A healthy wound should form a lining of bright red "beefy" granulating tissue that will help shrink the wound and help the edges grow new skin into it.   -A little mucus / yellow discharge is normal (the body's natural way to try and form a scab) and should be gently washed off with soap and water with daily dressing changes.  -Green or foul smelling drainage implies bacterial colonization and can slow wound healing - a short course of antibiotic ointment (3-5 days) can help it clear up.  Call the doctor if it does not improve or worsens  -Avoid use of antibiotic ointments for more than a week as they can slow wound healing over time.    -Sometimes other wound care products will be used to reduce need for dressing changes and/or help clean up dirty wounds -Sometimes the surgeon needs to debride the wound in the office to remove dead or infected tissue out of the wound so it can heal more quickly and safely.    Change the dressing at least once a day -Wash the wound with mild soap and water gently every day.  It is good to shower or bathe the wound to help it clean out. -Use clean 4x4 gauze for medium/large wounds or ribbon plain NU-gauze for smaller wounds (it does not need to be sterile, just clean) -Keep the raw wound moist with a little saline or KY (saline) gel on the gauze.  -A dry wound will take longer to heal.  -Keep the skin dry around the wound to prevent breakdown and irritation. -Pack the wound down to the base -The goal is to keep the skin apart, not overpack the wound -Use a Q-tip or blunt-tipped kabob stick toothpick to push the gauze down to the base in narrow or deep wounds   -Cover with a clean gauze and tape -paper or Medipore tape tend to be gentle on the skin -rotate the orientation of  the tape to avoid repeated stress/trauma on the skin -using an ACE or Coban wrap on wounds on arms or legs can be used instead.  Complete all antibiotics through the entire prescription to help the infection heal and prevent new places of infection   Returning the see the surgeon is helpful to follow the healing process and help the wound close as fast as possible.   ABDOMINAL SURGERY: POST OP INSTRUCTIONS  1. DIET: Follow a light bland diet the first 24  hours after arrival home, such as soup, liquids, crackers, etc.  Be sure to include lots of fluids daily.  Avoid fast food or heavy meals as your are more likely to get nauseated.  Eat a low fat the next few days after surgery.   2. Take your usually prescribed home medications unless otherwise directed. 3. PAIN CONTROL: a. Pain is best controlled by a usual combination of three different methods TOGETHER: i. Ice/Heat ii. Over the counter pain medication iii. Prescription pain medication b. Most patients will experience some swelling and bruising around the incisions.  Ice packs or heating pads (30-60 minutes up to 6 times a day) will help. Use ice for the first few days to help decrease swelling and bruising, then switch to heat to help relax tight/sore spots and speed recovery.  Some people prefer to use ice alone, heat alone, alternating between ice & heat.  Experiment to what works for you.  Swelling and bruising can take several weeks to resolve.   c. It is helpful to take an over-the-counter pain medication regularly for the first few weeks.  Choose one of the following that works best for you: i. Naproxen (Aleve, etc)  Two 22m tabs twice a day ii. Ibuprofen (Advil, etc) Three 2056mtabs four times a day (every meal & bedtime) iii. Acetaminophen (Tylenol, etc) 500-65048mour times a day (every meal & bedtime) d. A  prescription for pain medication (such as oxycodone, hydrocodone, etc) should be given to you upon discharge.  Take your  pain medication as prescribed.  i. If you are having problems/concerns with the prescription medicine (does not control pain, nausea, vomiting, rash, itching, etc), please call us [name] see if we need to switch you to a different pain medicine that will work better for you and/or control your side effect better. ii. If you need a refill on your pain medication, please contact your pharmacy.  They will contact our office to request authorization. Prescriptions will not be filled after 5 pm or on week-ends. 4. Avoid getting constipated.  Between the surgery and the pain medications, it is common to experience some constipation.  Increasing fluid intake and taking a fiber supplement (such as Metamucil, Citrucel, FiberCon, MiraLax, etc) 1-2 times a day regularly will usually help prevent this problem from occurring.  A mild laxative (prune juice, Milk of Magnesia, MiraLax, etc) should be taken according to package directions if there are no bowel movements after 48 hours.   5. Watch out for diarrhea.  If you have many loose bowel movements, simplify your diet to bland foods & liquids for a few days.  Stop any stool softeners and decrease your fiber supplement.  Switching to mild anti-diarrheal medications (Kayopectate, Pepto Bismol) can help.  If this worsens or does not improve, please call us.[name]. Wash / shower every day.  You may shower over the incision / wound.  Avoid baths until the skin is fully healed.  Continue to shower over incision(s) after the dressing is off. 7. Remove your waterproof bandages 5 days after surgery.  You may leave the incision open to air.  Remove any wicks or ribbons in your wound.  If you have an open wound, please see wound care instructions. You may replace a dressing/Band-Aid to cover the incision for comfort if you wish. 8. ACTIVITIES as tolerated:   a. You may resume regular (light) daily activities beginning the next day--such as daily self-care, walking, climbing  stairs--gradually increasing activities as  tolerated.  If you can walk 30 minutes without difficulty, it is safe to try more intense activity such as jogging, treadmill, bicycling, low-impact aerobics, swimming, etc. b. Save the most intensive and strenuous activity for last such as sit-ups, heavy lifting, contact sports, etc  Refrain from any heavy lifting or straining until you are off narcotics for pain control.   c. DO NOT PUSH THROUGH PAIN.  Let pain be your guide: If it hurts to do something, don't do it.  Pain is your body warning you to avoid that activity for another week until the pain goes down. d. You may drive when you are no longer taking prescription pain medication, you can comfortably wear a seatbelt, and you can safely maneuver your car and apply brakes. e. Dennis Bast may have sexual intercourse when it is comfortable.  9. FOLLOW UP in our office a. Please call [facility] at [phone] to set up an appointment to see your surgeon in the office for a follow-up appointment approximately 1-2 weeks after your surgery. b. Make sure that you call for this appointment the day you arrive home to insure a convenient appointment time. 10. IF YOU HAVE DISABILITY OR FAMILY LEAVE FORMS, BRING THEM TO THE OFFICE FOR PROCESSING.  DO NOT GIVE THEM TO YOUR DOCTOR.   WHEN TO CALL us [phone]: 1. Poor pain control 2. Reactions / problems with new medications (rash/itching, nausea, etc)  3. Fever over 101.5 F (38.5 C) 4. Inability to urinate 5. Nausea and/or vomiting 6. Worsening swelling or bruising 7. Continued bleeding from incision. 8. Increased pain, redness, or drainage from the incision  The clinic staff is available to answer your questions during regular business hours (8:30am-5pm).  Please dont hesitate to call and ask to speak to one of our nurses for clinical concerns.   A surgeon from [facility] is always on call at the hospitals   If you have a medical emergency, go to  the nearest emergency room or call 911.    [facility], [place], [place], [place], [place]  [address] ? MAIN: [phone] ? TOLL FREE: [phone] ? FAX [phone] [email or web address]   GETTING TO GOOD BOWEL HEALTH. Irregular bowel habits such as constipation and diarrhea can lead to many problems over time.  Having one soft bowel movement a day is the most important way to prevent further problems.  The anorectal canal is designed to handle stretching and feces to safely manage our ability to get rid of solid waste (feces, poop, stool) out of our body.  BUT, hard constipated stools can act like ripping concrete bricks and diarrhea can be a burning fire to this very sensitive area of our body, causing inflamed hemorrhoids, anal fissures, increasing risk is perirectal abscesses, abdominal pain/bloating, an making irritable bowel worse.     The goal: ONE SOFT BOWEL MOVEMENT A DAY!  To have soft, regular bowel movements:   Drink at least 8 tall glasses of water a day.    Take plenty of fiber.  Fiber is the undigested part of plant food that passes into the colon, acting s natures broom to encourage bowel motility and movement.  Fiber can absorb and hold large amounts of water. This results in a larger, bulkier stool, which is soft and easier to pass. Work gradually over several weeks up to 6 servings a day of fiber (25g a day even more if needed) in the form of: o Vegetables -- Root (  potatoes, carrots, turnips), leafy green (lettuce, salad greens, celery, spinach), or cooked high residue (cabbage, broccoli, etc) o Fruit -- Fresh (unpeeled skin & pulp), Dried (prunes, apricots, cherries, etc ),  or stewed ( applesauce)  o Whole grain breads, pasta, etc (whole wheat)  o Bran cereals   Bulking Agents -- This type of water-retaining fiber generally is easily obtained each day by one of the following:  o Psyllium bran -- The psyllium plant is  remarkable because its ground seeds can retain so much water. This product is available as Metamucil, Konsyl, Effersyllium, Per Diem Fiber, or the less expensive generic preparation in drug and health food stores. Although labeled a laxative, it really is not a laxative.  o Methylcellulose -- This is another fiber derived from wood which also retains water. It is available as Citrucel. o Polyethylene Glycol - and artificial fiber commonly called Miralax or Glycolax.  It is helpful for people with gassy or bloated feelings with regular fiber o Flax Seed - a less gassy fiber than psyllium  No reading or other relaxing activity while on the toilet. If bowel movements take longer than 5 minutes, you are too constipated  AVOID CONSTIPATION.  High fiber and water intake usually takes care of this.  Sometimes a laxative is needed to stimulate more frequent bowel movements, but   Laxatives are not a good long-term solution as it can wear the colon out. o Osmotics (Milk of Magnesia, Fleets phosphosoda, Magnesium citrate, MiraLax, GoLytely) are safer than  o Stimulants (Senokot, Castor Oil, Dulcolax, Ex Lax)    o Do not take laxatives for more than 7days in a row.   IF SEVERELY CONSTIPATED, try a Bowel Retraining Program: o Do not use laxatives.  o Eat a diet high in roughage, such as bran cereals and leafy vegetables.  o Drink six (6) ounces of prune or apricot juice each morning.  o Eat two (2) large servings of stewed fruit each day.  o Take one (1) heaping tablespoon of a psyllium-based bulking agent twice a day. Use sugar-free sweetener when possible to avoid excessive calories.  o Eat a normal breakfast.  o Set aside 15 minutes after breakfast to sit on the toilet, but do not strain to have a bowel movement.  o If you do not have a bowel movement by the third day, use an enema and repeat the above steps.   Controlling diarrhea o Switch to liquids and simpler foods for a few days to avoid  stressing your intestines further. o Avoid dairy products (especially milk & ice cream) for a short time.  The intestines often can lose the ability to digest lactose when stressed. o Avoid foods that cause gassiness or bloating.  Typical foods include beans and other legumes, cabbage, broccoli, and dairy foods.  Every person has some sensitivity to other foods, so listen to our body and avoid those foods that trigger problems for you. o Adding fiber (Citrucel, Metamucil, psyllium, Miralax) gradually can help thicken stools by absorbing excess fluid and retrain the intestines to act more normally.  Slowly increase the dose over a few weeks.  Too much fiber too soon can backfire and cause cramping & bloating. o Probiotics (such as active yogurt, Align, etc) may help repopulate the intestines and colon with normal bacteria and calm down a sensitive digestive tract.  Most studies show it to be of mild help, though, and such products can be costly. o Medicines: - Bismuth subsalicylate (ex. Kayopectate, Pepto  Bismol) every 30 minutes for up to 6 doses can help control diarrhea.  Avoid if pregnant. - Loperamide (Immodium) can slow down diarrhea.  Start with two tablets (63m total) first and then try one tablet every 6 hours.  Avoid if you are having fevers or severe pain.  If you are not better or start feeling worse, stop all medicines and call your doctor for advice o Call your doctor if you are getting worse or not better.  Sometimes further testing (cultures, endoscopy, X-ray studies, bloodwork, etc) may be needed to help diagnose and treat the cause of the diarrhea.  Managing Pain  Pain after surgery or related to activity is often due to strain/injury to muscle, tendon, nerves and/or incisions.  This pain is usually short-term and will improve in a few months.   Many people find it helpful to do the following things TOGETHER to help speed the process of healing and to get back to regular activity more  quickly:  1. Avoid heavy physical activity a.  no lifting greater than 20 pounds b. Do not push through the pain.  Listen to your body and avoid positions and maneuvers than reproduce the pain c. Walking is okay as tolerated, but go slowly and stop when getting sore.  d. Remember: If it hurts to do it, then dont do it! 2. Take Anti-inflammatory medication  a. Take with food/snack around the clock for 1-2 weeks i. This helps the muscle and nerve tissues become less irritable and calm down faster b. Choose ONE of the following over-the-counter medications: i. Naproxen 2270mtabs (ex. Aleve) 1-2 pills twice a day  ii. Ibuprofen 20056mabs (ex. Advil, Motrin) 3-4 pills with every meal and just before bedtime iii. Acetaminophen 500m86mbs (Tylenol) 1-2 pills with every meal and just before bedtime 3. Use a Heating pad or Ice/Cold Pack a. 4-6 times a day b. May use warm bath/hottub  or showers 4. Try Gentle Massage and/or Stretching  a. at the area of pain many times a day b. stop if you feel pain - do not overdo it  Try these steps together to help you body heal faster and avoid making things get worse.  Doing just one of these things may not be enough.    If you are not getting better after two weeks or are noticing you are getting worse, contact our office for further advice; we may need to re-evaluate you & see what other things we can do to help.  Pelvic floor muscle training exercises ("Kegels") can help strengthen the muscles under the uterus, bladder, and bowel (large intestine). They can help both men and women who have problems with urine leakage or bowel control.  A pelvic floor muscle training exercise is like pretending that you have to urinate, and then holding it. You relax and tighten the muscles that control urine flow. It's important to find the right muscles to tighten.  The next time you have to urinate, start to go and then stop. Feel the muscles in your vagina,  bladder, or anus get tight and move up. These are the pelvic floor muscles. If you feel them tighten, you've done the exercise right. If you are still not sure whether you are tightening the right muscles, keep in mind that all of the muscles of the pelvic floor relax and contract at the same time. Because these muscles control the bladder, rectum, and vagina, the following tips may help: Women: Insert a finger into your  vagina. Tighten the muscles as if you are holding in your urine, then let go. You should feel the muscles tighten and move up and down.  Men: Insert a finger into your rectum. Tighten the muscles as if you are holding in your urine, then let go. You should feel the muscles tighten and move up and down. These are the same muscles you would tighten if you were trying to prevent yourself from passing gas.  It is very important that you keep the following muscles relaxed while doing pelvic floor muscle training exercises: Abdominal  Buttocks (the deeper, anal sphincter muscle should contract)  Thigh   A woman can also strengthen these muscles by using a vaginal cone, which is a weighted device that is inserted into the vagina. Then you try to tighten the pelvic floor muscles to hold the device in place. If you are unsure whether you are doing the pelvic floor muscle training correctly, you can use biofeedback and electrical stimulation to help find the correct muscle group to work. Biofeedback is a method of positive reinforcement. Electrodes are placed on the abdomen and along the anal area. Some therapists place a sensor in the vagina in women or anus in men to monitor the contraction of pelvic floor muscles.  A monitor will display a graph showing which muscles are contracting and which are at rest. The therapist can help find the right muscles for performing pelvic floor muscle training exercises.   PERFORMING PELVIC FLOOR EXERCISES: 1. Begin by emptying your bladder. 2. Tighten the  pelvic floor muscles and hold for a count of 10. 3. Relax the muscles completely for a count of 10. 4. Do 10 repititions, 3 to 5 times a day (morning, afternoon, and night). You can do these exercises at any time and any place. Most people prefer to do the exercises while lying down or sitting in a chair. After 4 - 6 weeks, most people notice some improvement. It may take as long as 3 months to see a major change. After a couple of weeks, you can also try doing a single pelvic floor contraction at times when you are likely to leak (for example, while getting out of a chair). A word of caution: Some people feel that they can speed up the progress by increasing the number of repetitions and the frequency of exercises. However, over-exercising can instead cause muscle fatigue and increase urine leakage. If you feel any discomfort in your abdomen or back while doing these exercises, you are probably doing them wrong. Breathe deeply and relax your body when you are doing these exercises. Make sure you are not tightening your stomach, thigh, buttock, or chest muscles. When done the right way, pelvic floor muscle exercises have been shown to be very effective at improving urinary continence.  Pelvic Floor Pain / Incontinence  Do you suffer from pelvic pain or incontinence? Do you have pain in the pelvis, low back or hips that is associated with sitting, walking, urination or intercourse? Have you experienced leaking of urine or feces when coughing, sneezing or laughing? Do you have pain in the pelvic area associated with cancer?  These are conditions that are common with pelvic floor muscle dysfunction. Over time, due to stress, scar tissue, surgeries and the natural course of aging, our muscles may become weak or overstressed and can spasm. This can lead to pain, weakness, incontinence or decreased quality of life.  Men and women with pelvic floor dysfunction frequently describe:  A  falling out  feeling. Pain or burning in the abdomen, tailbone or perineal area. Constipation or bowel elimination problems or difficulty initiating urination. Unresolved low back or hip pain. Frequency and urgency when going to the bathroom. Leaking of urine or feces. Pain with intercourse.  [email or web address]  To make a referral or for more information about [facility] Pelvic Floor Therapy Program, call  [facility]) - [phone] [place]) - [phone] [facility]) [phone]   Diverticulitis Diverticulitis is inflammation or infection of small pouches in your colon that form when you have a condition called diverticulosis. The pouches in your colon are called diverticula. Your colon, or large intestine, is where water is absorbed and stool is formed. Complications of diverticulitis can include:  Bleeding.  Severe infection.  Severe pain.  Perforation of your colon.  Obstruction of your colon. CAUSES  Diverticulitis is caused by bacteria. Diverticulitis happens when stool becomes trapped in diverticula. This allows bacteria to grow in the diverticula, which can lead to inflammation and infection. RISK FACTORS People with diverticulosis are at risk for diverticulitis. Eating a diet that does not include enough fiber from fruits and vegetables may make diverticulitis more likely to develop. SYMPTOMS  Symptoms of diverticulitis may include:  Abdominal pain and tenderness. The pain is normally located on the left side of the abdomen, but may occur in other areas.  Fever and chills.  Bloating.  Cramping.  Nausea.  Vomiting.  Constipation.  Diarrhea.  Blood in your stool. DIAGNOSIS  Your health care provider will ask you about your medical history and do a physical exam. You may need to have tests done  because many medical conditions can cause the same symptoms as diverticulitis. Tests may include:  Blood tests.  Urine tests.  Imaging tests of the abdomen, including X-rays and CT scans. When your condition is under control, your health care provider may recommend that you have a colonoscopy. A colonoscopy can show how severe your diverticula are and whether something else is causing your symptoms. TREATMENT  Most cases of diverticulitis are mild and can be treated at home. Treatment may include:  Taking over-the-counter pain medicines.  Following a clear liquid diet.  Taking antibiotic medicines by mouth for 7-10 days. More severe cases may be treated at a hospital. Treatment may include:  Not eating or drinking.  Taking prescription pain medicine.  Receiving antibiotic medicines through an IV tube.  Receiving fluids and nutrition through an IV tube.  Surgery. HOME CARE INSTRUCTIONS   Follow your health care provider's instructions carefully.  Follow a full liquid diet or other diet as directed by your health care provider. After your symptoms improve, your health care provider may tell you to change your diet. He or she may recommend you eat a high-fiber diet. Fruits and vegetables are good sources of fiber. Fiber makes it easier to pass stool.  Take fiber supplements or probiotics as directed by your health care provider.  Only take medicines as directed by your health care provider.  Keep all your follow-up appointments. SEEK MEDICAL CARE IF:   Your pain does not improve.  You have a hard time eating food.  Your bowel movements do not return to normal. SEEK IMMEDIATE MEDICAL CARE IF:   Your pain becomes worse.  Your symptoms do not get better.  Your symptoms suddenly get worse.  You have a fever.  You have repeated vomiting.  You have bloody or black, tarry stools. MAKE SURE YOU:  Understand these instructions.  Will watch your condition.  Will  get help right away if you are not doing well or get worse. Document Released: 12/05/2004 Document Revised: 03/02/2013 Document Reviewed: 01/20/2013 [facility] Patient Information 2015 [place], [place]. This information is not intended to replace advice given to you by your health care provider. Make sure you discuss any questions you have with your health care provider.  GETTING TO GOOD BOWEL HEALTH. Irregular bowel habits such as constipation and diarrhea can lead to many problems over time.  Having one soft bowel movement a day is the most important way to prevent further problems.  The anorectal canal is designed to handle stretching and feces to safely manage our ability to get rid of solid waste (feces, poop, stool) out of our body.  BUT, hard constipated stools can act like ripping concrete bricks and diarrhea can be a burning fire to this very sensitive area of our body, causing inflamed hemorrhoids, anal fissures, increasing risk is perirectal abscesses, abdominal pain/bloating, an making irritable bowel worse.     The goal: ONE SOFT BOWEL MOVEMENT A DAY!  To have soft, regular bowel movements:   Drink at least 8 tall glasses of water a day.    Take plenty of fiber.  Fiber is the undigested part of plant food that passes into the colon, acting s natures broom to encourage bowel motility and movement.  Fiber can absorb and hold large amounts of water. This results in a larger, bulkier stool, which is soft and easier to pass. Work gradually over several weeks up to 6 servings a day of fiber (25g a day even more if needed) in the form of: o Vegetables -- Root (potatoes, carrots, turnips), leafy green (lettuce, salad greens, celery, spinach), or cooked high residue (cabbage, broccoli, etc) o Fruit -- Fresh (unpeeled skin & pulp), Dried (prunes, apricots, cherries, etc ),  or stewed ( applesauce)  o Whole grain breads, pasta, etc (whole wheat)  o Bran cereals   Bulking Agents -- This type of  water-retaining fiber generally is easily obtained each day by one of the following:  o Psyllium bran -- The psyllium plant is remarkable because its ground seeds can retain so much water. This product is available as Metamucil, Konsyl, Effersyllium, Per Diem Fiber, or the less expensive generic preparation in drug and health food stores. Although labeled a laxative, it really is not a laxative.  o Methylcellulose -- This is another fiber derived from wood which also retains water. It is available as Citrucel. o Polyethylene Glycol - and artificial fiber commonly called Miralax or Glycolax.  It is helpful for people with gassy or bloated feelings with regular fiber o Flax Seed - a less gassy fiber than psyllium  No reading or other relaxing activity while on the toilet. If bowel movements take longer than 5 minutes, you are too constipated  AVOID CONSTIPATION.  High fiber and water intake usually takes care of this.  Sometimes a laxative is needed to stimulate more frequent bowel movements, but   Laxatives are not a good long-term solution as it can wear the colon out. o Osmotics (Milk of Magnesia, Fleets phosphosoda, Magnesium citrate, MiraLax, GoLytely) are safer than  o Stimulants (Senokot, Castor Oil, Dulcolax, Ex Lax)    o Do not take laxatives for more than 7days in a row.   IF SEVERELY CONSTIPATED, try a Bowel Retraining Program: o Do not use laxatives.  o Eat a diet high in roughage, such as  bran cereals and leafy vegetables.  o Drink six (6) ounces of prune or apricot juice each morning.  o Eat two (2) large servings of stewed fruit each day.  o Take one (1) heaping tablespoon of a psyllium-based bulking agent twice a day. Use sugar-free sweetener when possible to avoid excessive calories.  o Eat a normal breakfast.  o Set aside 15 minutes after breakfast to sit on the toilet, but do not strain to have a bowel movement.  o If you do not have a bowel movement by the third day, use an  enema and repeat the above steps.   Controlling diarrhea o Switch to liquids and simpler foods for a few days to avoid stressing your intestines further. o Avoid dairy products (especially milk & ice cream) for a short time.  The intestines often can lose the ability to digest lactose when stressed. o Avoid foods that cause gassiness or bloating.  Typical foods include beans and other legumes, cabbage, broccoli, and dairy foods.  Every person has some sensitivity to other foods, so listen to our body and avoid those foods that trigger problems for you. o Adding fiber (Citrucel, Metamucil, psyllium, Miralax) gradually can help thicken stools by absorbing excess fluid and retrain the intestines to act more normally.  Slowly increase the dose over a few weeks.  Too much fiber too soon can backfire and cause cramping & bloating. o Probiotics (such as active yogurt, Align, etc) may help repopulate the intestines and colon with normal bacteria and calm down a sensitive digestive tract.  Most studies show it to be of mild help, though, and such products can be costly. o Medicines: - Bismuth subsalicylate (ex. Kayopectate, Pepto Bismol) every 30 minutes for up to 6 doses can help control diarrhea.  Avoid if pregnant. - Loperamide (Immodium) can slow down diarrhea.  Start with two tablets (64m total) first and then try one tablet every 6 hours.  Avoid if you are having fevers or severe pain.  If you are not better or start feeling worse, stop all medicines and call your doctor for advice o Call your doctor if you are getting worse or not better.  Sometimes further testing (cultures, endoscopy, X-ray studies, bloodwork, etc) may be needed to help diagnose and treat the cause of the diarrhea.  Managing Pain  Pain after surgery or related to activity is often due to strain/injury to muscle, tendon, nerves and/or incisions.  This pain is usually short-term and will improve in a few months.   Many people find it  helpful to do the following things TOGETHER to help speed the process of healing and to get back to regular activity more quickly:  5. Avoid heavy physical activity a.  no lifting greater than 20 pounds b. Do not push through the pain.  Listen to your body and avoid positions and maneuvers than reproduce the pain c. Walking is okay as tolerated, but go slowly and stop when getting sore.  d. Remember: If it hurts to do it, then dont do it! 6. Take Anti-inflammatory medication  a. Take with food/snack around the clock for 1-2 weeks i. This helps the muscle and nerve tissues become less irritable and calm down faster b. Choose ONE of the following over-the-counter medications: i. Naproxen 2221mtabs (ex. Aleve) 1-2 pills twice a day  ii. Ibuprofen 20045mabs (ex. Advil, Motrin) 3-4 pills with every meal and just before bedtime iii. Acetaminophen 500m26mbs (Tylenol) 1-2 pills with every meal and  just before bedtime 7. Use a Heating pad or Ice/Cold Pack a. 4-6 times a day b. May use warm bath/hottub  or showers 8. Try Gentle Massage and/or Stretching  a. at the area of pain many times a day b. stop if you feel pain - do not overdo it  Try these steps together to help you body heal faster and avoid making things get worse.  Doing just one of these things may not be enough.    If you are not getting better after two weeks or are noticing you are getting worse, contact our office for further advice; we may need to re-evaluate you & see what other things we can do to help.  Colonoscopy A colonoscopy is an exam to look at the entire large intestine (colon). This exam can help find problems such as tumors, polyps, inflammation, and areas of bleeding. The exam takes about 1 hour.  LET [facility] CARE PROVIDER KNOW ABOUT:   Any allergies you have.  All medicines you are taking, including vitamins, herbs, eye drops, creams, and over-the-counter medicines.  Previous problems you or members of  your family have had with the use of anesthetics.  Any blood disorders you have.  Previous surgeries you have had.  Medical conditions you have. RISKS AND COMPLICATIONS  Generally, this is a safe procedure. However, as with any procedure, complications can occur. Possible complications include:  Bleeding.  Tearing or rupture of the colon wall.  Reaction to medicines given during the exam.  Infection (rare). BEFORE THE PROCEDURE   Ask your health care provider about changing or stopping your regular medicines.  You may be prescribed an oral bowel prep. This involves drinking a large amount of medicated liquid, starting the day before your procedure. The liquid will cause you to have multiple loose stools until your stool is almost clear or light green. This cleans out your colon in preparation for the procedure.  Do not eat or drink anything else once you have started the bowel prep, unless your health care provider tells you it is safe to do so.  Arrange for someone to drive you home after the procedure. PROCEDURE   You will be given medicine to help you relax (sedative).  You will lie on your side with your knees bent.  A long, flexible tube with a light and camera on the end (colonoscope) will be inserted through the rectum and into the colon. The camera sends video back to a computer screen as it moves through the colon. The colonoscope also releases carbon dioxide gas to inflate the colon. This helps your health care provider see the area better.  During the exam, your health care provider may take a small tissue sample (biopsy) to be examined under a microscope if any abnormalities are found.  The exam is finished when the entire colon has been viewed. AFTER THE PROCEDURE   Do not drive for 24 hours after the exam.  You may have a small amount of blood in your stool.  You may pass moderate amounts of gas and have mild abdominal cramping or bloating. This is caused by  the gas used to inflate your colon during the exam.  Ask when your test results will be ready and how you will get your results. Make sure you get your test results. Document Released: 02/23/2000 Document Revised: 12/16/2012 Document Reviewed: 11/02/2012 [facility] Patient Information 2015 [place], [place]. This information is not intended to replace advice given to you by your  health care provider. Make sure you discuss any questions you have with your health care provider.   [facility], [place] [id number]  OPEN ABDOMINAL SURGERY: POST OP INSTRUCTIONS  Always review your discharge instruction sheet given to you by the facility where your surgery was performed.  IF YOU HAVE DISABILITY OR FAMILY LEAVE FORMS, YOU MUST BRING THEM TO THE OFFICE FOR PROCESSING.  PLEASE DO NOT GIVE THEM TO YOUR DOCTOR.  1. A prescription for pain medication may be given to you upon discharge.  Take your pain medication as prescribed, if needed.  If narcotic pain medicine is not needed, then you may take acetaminophen (Tylenol) or ibuprofen (Advil) as needed. 2. Take your usually prescribed medications unless otherwise directed. 3. If you need a refill on your pain medication, please contact your pharmacy. They will contact our office to request authorization.  Prescriptions will not be filled after 5pm or on week-ends. 4. You should follow a light diet the first few days after arrival home, such as soup and crackers, pudding, etc.unless your doctor has advised otherwise. A high-fiber, low fat diet can be resumed as tolerated.   Be sure to include lots of fluids daily. Most patients will experience some swelling and bruising on the chest and neck area.  Ice packs will help.  Swelling and bruising can take several days to resolve 5. Most patients will experience some swelling and bruising in the area of the incision. Ice pack will help. Swelling and bruising can take several days to resolve..  6. It is  common to experience some constipation if taking pain medication after surgery.  Increasing fluid intake and taking a stool softener will usually help or prevent this problem from occurring.  A mild laxative (Milk of Magnesia or Miralax) should be taken according to package directions if there are no bowel movements after 48 hours. 7.  You may have steri-strips (small skin tapes) in place directly over the incision.  These strips should be left on the skin for 7-10 days.  If your surgeon used skin glue on the incision, you may shower in 24 hours.  The glue will flake off over the next 2-3 weeks.  Any sutures or staples will be removed at the office during your follow-up visit. You may find that a light gauze bandage over your incision may keep your staples from being rubbed or pulled. You may shower and replace the bandage daily. 8. ACTIVITIES:  You may resume regular (light) daily activities beginning the next day--such as daily self-care, walking, climbing stairs--gradually increasing activities as tolerated.  You may have sexual intercourse when it is comfortable.  Refrain from any heavy lifting or straining until approved by your doctor. a. You may drive when you no longer are taking prescription pain medication, you can comfortably wear a seatbelt, and you can safely maneuver your car and apply brakes b. Return to Work: ___________________________________ 40. You should see your doctor in the office for a follow-up appointment approximately two weeks after your surgery.  Make sure that you call for this appointment within a day or two after you arrive home to insure a convenient appointment time. OTHER INSTRUCTIONS:  _____________________________________________________________ _____________________________________________________________  WHEN TO CALL YOUR DOCTOR: 9. Fever over 101.0 10. Inability to urinate 11. Nausea and/or vomiting 12. Extreme swelling or bruising 13. Continued bleeding from  incision. 14. Increased pain, redness, or drainage from the incision. 15. Difficulty swallowing or breathing 16. Muscle cramping or  spasms. 17. Numbness or tingling in hands or feet or around lips.  The clinic staff is available to answer your questions during regular business hours.  Please dont hesitate to call and ask to speak to one of the nurses if you have concerns.  For further questions, please visit [email or web address]

## 2014-05-10 NOTE — Progress Notes (Signed)
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 Handouts given, teach-back used.  - RD will continue to monitor  Nutrition Dx:   Inadequate oral intake related to inability to eat as evidenced by NPO status; improving  Goal:   Pt to meet >/= 90% of estimated needs; improving  Monitor:   Pt TPN tolerance, weight trends, labs, acceptance of supplements  Assessment:   Pt with no significant past medical history presents with diverticulitis of colon with perforation. Consulted for TPN.   Per weight records and patient weight has been stable. Pt reports not change in appetite. Pt reports exercising 75 minutes everyday.   Nutrition Focus Physical Exam showed no signs of fat or muscle wasting.   2/29: Pt asleep during RD visit.  Per nursing student, pt is tolerating ice chips and broth. Current diet is Dysphagia I and pt is to order a meal once she wakes up. Will order nutritional supplements to improve po intake. Pt having flatus/BM. TPN to be weaned per pharmacy.   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  - Pt is tolerating a soft diet. Today she has had mashed potatoes, chicken broth, apple juice, toast, eggs, and cream of potato soup. She agreed to drink Ensure supplement later today. She also wanted to try [employer].  - Pt was following a very strict diet prior to hospitalization. She was following Weight Watchers. Pt with lots of questions regarding changes she will have to make in her diet. Extensive education given. - TPN weaning - Labs reviewed  2/29 plan per pharmacy: At 1800 today:  Reduce Clinimix-E 5/15 to 40 ml/hr - note TPN can be stopped at this rate tomorrow if is going to be  d/c'd  Continue 20% fat emulsion at 10 ml/hr.  Height: Ht Readings from Last 1 Encounters:  04/29/14 5\' 2"  (1.575 m)    Weight Status:   Wt Readings from Last 1 Encounters:  05/06/14 115 lb 8.3 oz (52.4 kg)    Re-estimated needs:  Kcal: 1500-1700 kcal Protein: 75-85 g protein Fluid: >/1.5 L/day  Skin: Incision on abdomen  Diet Order: DIET SOFT   Intake/Output Summary (Last 24 hours) at 05/10/14 1538 Last data filed at 05/10/14 1117  Gross per 24 hour  Intake    271 ml  Output   1535 ml  Net  -1264 ml    Last BM: 2/23   Labs:   Recent Labs Lab 05/06/14 0425 05/07/14 0405 05/09/14 0450  NA 139 138 136  K 3.3* 3.6 3.9  CL 107 106 106  CO2 28 28 24   BUN 20 16 20   CREATININE 0.77 0.70 0.74  CALCIUM 7.7* 8.0* 8.2*  MG 1.8 1.9  --   PHOS 3.2 3.1 2.7  GLUCOSE 141* 128* 138*    CBG (last 3)   Recent Labs  05/10/14 0602 05/10/14 0820 05/10/14 1210  GLUCAP 138* 137* 169*    Scheduled Meds: . acetaminophen (TYLENOL) oral liquid 160 mg/5 mL  1,000 mg Oral 3 times per day  . feeding supplement (ENSURE COMPLETE)  237 mL Oral BID BM  . heparin subcutaneous  5,000 Units Subcutaneous 3 times per day  . lip balm  1 application Topical  BID  . pantoprazole (PROTONIX) IV  40 mg Intravenous QHS  . piperacillin-tazobactam (ZOSYN)  IV  3.375 g Intravenous Q8H    Continuous Infusions: . dextrose 5% lactated ringers 10 mL/hr (05/08/14 0828)   Laurette Schimke [place], [place], [place]

## 2014-05-11 MED ORDER — AMOXICILLIN-POT CLAVULANATE 875-125 MG PO TABS: 1.0000 | ORAL_TABLET | Freq: Two times a day (BID) | ORAL | Status: DC

## 2014-05-11 MED ORDER — ONDANSETRON 4 MG PO TBDP
4.0000 mg | ORAL_TABLET | Freq: Three times a day (TID) | ORAL | Status: DC | PRN
Start: 2014-05-11 — End: 2014-11-15

## 2014-05-11 MED ORDER — HYDROCODONE-ACETAMINOPHEN 5-325 MG PO TABS: 1.0000 | ORAL_TABLET | Freq: Four times a day (QID) | ORAL | Status: DC | PRN

## 2014-05-11 MED ORDER — ACETAMINOPHEN 500 MG PO TABS
1000.0000 mg | ORAL_TABLET | Freq: Four times a day (QID) | ORAL | Status: DC | PRN
  Administered 2014-05-11: 1000 mg via ORAL
  Filled 2014-05-11: qty 2

## 2014-05-11 MED ORDER — AMOXICILLIN-POT CLAVULANATE 875-125 MG PO TABS
1.0000 | ORAL_TABLET | Freq: Once | ORAL | Status: DC
  Filled 2014-05-11: qty 1

## 2014-05-11 NOTE — Consult Note (Signed)
WOC wound follow up Wound type:Midline surgical Measurement:12cm x 3cm x 2cm Wound bed:red, moist Drainage (amount, consistency, odor) serous Periwound:intact, with small erythematous area at umbilicus Dressing procedure/placement/frequency: NPWT at 180mmHg continuous pressure.  One piece of black foam used to obliterate dead space.  Immediate effective seal achieved. Patient's system is attached to portable V.A.C. Freedom unit for home use.  WOC ostomy follow up Stoma type/location: LLQ Colostomy Stomal assessment/size: 1 and 1/2 inches round Peristomal assessment: intact, clear Treatment options for stomal/peristomal skin: skin barrier ring Output brown effluent Ostomy pouching: 2pc. with skin barrier ring Education provided: Patient is independent with pouch emptying. Pouching system will be changed every MWF until [facility] is discontinued with assistance of HHRN.  Patient has discharge supplies, supplies on their way from [facility] and will be guideline to independence in supply ordering by [facility] with [facility] personnel. Enrolled patient in [place] [facility] Discharge program: Yes  It has been my pleasure to assist you in the care of this nice woman and her family. [place] nursing team will not follow, but will remain available to this patient, the nursing and medical team.  Please re-consult if needed. Thanks, Maudie Flakes, MSN, RN, [place], [place], [place] [phone])

## 2014-05-11 NOTE — Progress Notes (Signed)
Per MD order, PICC line removed. Cath intact at 37cm. Vaseline pressure gauze to site, pressure held x 5min. No bleeding to site. Pt instructed to keep dressing CDI x 24 hours. Avoid heavy lifting, pushing or pulling x 24 hours,  If bleeding occurs hold pressure, if bleeding does not stop contact MD or go to the ED. Pt does not have any questions. Kristen Castillo M  

## 2014-05-11 NOTE — Progress Notes (Signed)
Wasted 53ml of Morphine PCA with [employer].

## 2014-05-13 ENCOUNTER — Telehealth: Payer: Self-pay | Admitting: *Deleted

## 2014-05-13 NOTE — Telephone Encounter (Signed)
Called pt concerning TCM appt no answer LMOM RTC.../lmb 

## 2014-05-16 NOTE — Telephone Encounter (Signed)
Called pt concerning TCM completed call below.../lmb  Transition Care Management Follow-up Telephone Call D/C 05/11/14  How have you been since you were released from the hospital? Called pt spoke with caregiver Maudie Mercury) & pt she states she is doing ok   Do you understand why you were in the hospital? YES   Do you understand the discharge instrcutions? YES  Items Reviewed:  Medications reviewed: YES  Allergies reviewed: YES  Dietary changes reviewed: YES  Referrals reviewed: caregiver states [facility] has started coming out Mon, wed, and Fri   Functional Questionnaire:   Activities of Daily Living (ADLs):   She states they are independent in the following: bathing and hygiene, feeding, continence and toileting States she require assistance with the following: ambulation  Any transportation issues/concerns?: NO   Any patient concerns? NO   Confirmed importance and date/time of follow-up visits scheduled: YES, made appt for 05/24/14 w/Dr. Plotnikov   Confirmed with patient if condition begins to worsen call PCP or go to the ER.

## 2014-05-17 ENCOUNTER — Encounter: Payer: Self-pay | Admitting: Internal Medicine

## 2014-05-24 ENCOUNTER — Ambulatory Visit (INDEPENDENT_AMBULATORY_CARE_PROVIDER_SITE_OTHER): Payer: Medicare Other | Admitting: Internal Medicine

## 2014-05-24 ENCOUNTER — Other Ambulatory Visit: Payer: Medicare Other

## 2014-05-24 ENCOUNTER — Other Ambulatory Visit (INDEPENDENT_AMBULATORY_CARE_PROVIDER_SITE_OTHER): Payer: Medicare Other

## 2014-05-24 ENCOUNTER — Encounter: Payer: Self-pay | Admitting: Internal Medicine

## 2014-05-24 VITALS — BP 130/70 | HR 101 | Wt 114.0 lb

## 2014-05-24 DIAGNOSIS — D509 Iron deficiency anemia, unspecified: Secondary | ICD-10-CM | POA: Diagnosis not present

## 2014-05-24 DIAGNOSIS — E440 Moderate protein-calorie malnutrition: Secondary | ICD-10-CM

## 2014-05-24 DIAGNOSIS — K5732 Diverticulitis of large intestine without perforation or abscess without bleeding: Secondary | ICD-10-CM | POA: Diagnosis not present

## 2014-05-24 DIAGNOSIS — K5720 Diverticulitis of large intestine with perforation and abscess without bleeding: Secondary | ICD-10-CM

## 2014-05-24 DIAGNOSIS — D649 Anemia, unspecified: Secondary | ICD-10-CM | POA: Insufficient documentation

## 2014-05-24 LAB — CBC WITH DIFFERENTIAL/PLATELET
BASOS ABS: 0.1 10*3/uL (ref 0.0–0.1)
Basophils Relative: 0.9 % (ref 0.0–3.0)
EOS ABS: 0.6 10*3/uL (ref 0.0–0.7)
Eosinophils Relative: 6.5 % — ABNORMAL HIGH (ref 0.0–5.0)
HEMATOCRIT: 32.9 % — AB (ref 36.0–46.0)
HEMOGLOBIN: 11.1 g/dL — AB (ref 12.0–15.0)
LYMPHS PCT: 18.6 % (ref 12.0–46.0)
Lymphs Abs: 1.8 10*3/uL (ref 0.7–4.0)
MCHC: 33.8 g/dL (ref 30.0–36.0)
MCV: 85.8 fl (ref 78.0–100.0)
Monocytes Absolute: 0.7 10*3/uL (ref 0.1–1.0)
Monocytes Relative: 7 % (ref 3.0–12.0)
NEUTROS ABS: 6.6 10*3/uL (ref 1.4–7.7)
Neutrophils Relative %: 67 % (ref 43.0–77.0)
Platelets: 444 10*3/uL — ABNORMAL HIGH (ref 150.0–400.0)
RBC: 3.84 Mil/uL — AB (ref 3.87–5.11)
RDW: 14.3 % (ref 11.5–15.5)
WBC: 9.8 10*3/uL (ref 4.0–10.5)

## 2014-05-24 LAB — IBC PANEL
IRON: 39 ug/dL — AB (ref 42–145)
Saturation Ratios: 12.3 % — ABNORMAL LOW (ref 20.0–50.0)
Transferrin: 226 mg/dL (ref 212.0–360.0)

## 2014-05-24 LAB — HEPATIC FUNCTION PANEL
ALBUMIN: 3.7 g/dL (ref 3.5–5.2)
ALK PHOS: 90 U/L (ref 39–117)
ALT: 10 U/L (ref 0–35)
AST: 13 U/L (ref 0–37)
Bilirubin, Direct: 0.1 mg/dL (ref 0.0–0.3)
TOTAL PROTEIN: 7.9 g/dL (ref 6.0–8.3)
Total Bilirubin: 0.3 mg/dL (ref 0.2–1.2)

## 2014-05-24 LAB — BASIC METABOLIC PANEL
BUN: 27 mg/dL — AB (ref 6–23)
CO2: 30 mEq/L (ref 19–32)
Calcium: 9.9 mg/dL (ref 8.4–10.5)
Chloride: 102 mEq/L (ref 96–112)
Creatinine, Ser: 0.75 mg/dL (ref 0.40–1.20)
GFR: 80.41 mL/min (ref >60.00)
GLUCOSE: 123 mg/dL — AB (ref 70–99)
Potassium: 3.7 mEq/L (ref 3.5–5.1)
Sodium: 135 mEq/L (ref 135–145)

## 2014-05-24 LAB — TSH: TSH: 2.63 u[IU]/mL (ref 0.35–4.50)

## 2014-05-24 LAB — VITAMIN B12: Vitamin B-12: 647 pg/mL (ref 211–911)

## 2014-05-24 LAB — VITAMIN D 25 HYDROXY (VIT D DEFICIENCY, FRACTURES): VITD: 30.37 ng/mL (ref 30.00–100.00)

## 2014-05-24 NOTE — Progress Notes (Signed)
Subjective:     HPI  Post-hosp f/up:  Admit date: 04/28/2014 Discharge date: 05/11/2014  Admitting Diagnosis: Acute perforated diverticulitis with multiple intra-abdominal abscesses   Discharge Diagnosis Patient Active Problem List   Diagnosis Date Noted  . Mild malnutrition 05/08/2014  . Abdominal pain   . Dehydration   . Migraine without aura and without status migrainosus, not intractable   . Sigmoid diverticulitis 05/03/2014  . Difficulty tolerating colonoscopy bowel prep 04/29/2014  . Anxiety, mild 04/29/2014  . 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 Diverticulitis of colon with perforation 04/28/2014  . GERD (gastroesophageal reflux disease) 04/28/2014  . Cerumen impaction 03/21/2014  . Cough 03/21/2014  . Well adult exam 03/21/2014  . 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 Rectal discharge 09/07/2011  . Constipation - functional 09/07/2011  . Nephrolithiasis 01/08/2011  . Diverticulosis of colon 01/08/2011  . HEMATURIA, MICROSCOPIC, HX OF 01/28/2008  . Vitamin D deficiency 04/01/2007  . HAIR LOSS 04/01/2007  . TREMOR 04/01/2007  . Allergic rhinitis 03/31/2007  . OSTEOARTHRITIS 03/31/2007  . MIGRAINES, HX OF 03/31/2007  . Osteoporosis 11/12/2006    Consultants WOC nursing Dr. Hal Hope - Internal medicine Dietitian   Imaging:  Imaging Results (Last 48 hours)    No results found.     Procedures Dr. Zella Richer (05/03/14) - Diagnostic laparoscopy and drainage of right pelvic and left lower quadrant abscess. Exploratory laparotomy, sigmoid colectomy, drainage of left pelvic abscess, descending colostomy.   Hospital Course:  Elderly 74 y/o white female with history of diverticulosis by prior colonoscopies. Has had occasional irregular bowels but not severe. Dwindling tolerance to oral bowel prep, so she has held off on a follow-up colonoscopy in 7  years. Eating pistachio nuts last weekend. Had a little mild discomfort for a few days. Then had definite intense lower abdominal pain at "2:06pm" today. The patient does not recall any prior episodes of diverticulitis. She recalls having kidney stones and wondered if she had an attack. Because the pain was rather intense, she came to the emergency room. Evidence of abdominal pain. CT scan concerning for diverticulitis with microperforation and numerous regions. Not hemodynamically in shock. Surgical consultation requested. Recommendation made for hospitalization given perforation.  Patient is rather physically active and exercises regularly. No history of cardiopulmonary issues. Usually has a bowel movement every other day to a few a day. No severe episodes of bleeding. She has had hysterectomy and oophrectomy. These were in the 1970s. No other abdominal surgery. No history of Crohn's nor ulcerative colitis. Possible history of colon polyps has been recommended to have colonoscopy every 5 years. No history of sick contacts or recent travel history. No history of C. difficile. No history of colitis. No hematochezia or melena. She did have some nausea and retching with this abdominal pain and felt rather dehydrated. Since she received IV fluids, she is feeling better.   Patient was admitted and was transferred to the floor. Conservative management including antibiotics did not significantly improve the patients condition. A repeat CT showed worsening free air and multiple intra-abdominal abscesses. Therefore, the decision to proceed with surgical intervention was made. Post-operatively she required aggressive fluid resuscitation. She was started on TPN due to malnutrition. She continued to be on IV Zosyn post-operatively for 10 additional days. Post-operative ileus soon resolved and TPN was weaned to off as diet was advanced. [facility] nursing met with the patient and did ostomy  teaching and dressing changes. She was switched from WD dressings to a wound vac  M/W/F. WBC continued to improve. JP drain became serosanguinous and was removed prior to discharge. On HD#13, the patient was voiding well, tolerating diet, ambulating well, pain well controlled, vital signs stable, incisions c/d/i and felt stable for discharge home. Patient will follow up in our office in 2-3 weeks and knows to call with questions or concerns.    Physical Exam: General: Alert, NAD, pleasant, comfortable Abd: Soft, ND, minimal tenderness, incisions C/D/I, wound vac in place, serosanguinous drainage from wound vac, JP drain with minimal serous drainage (now removed), colostomy pink with good stool output in bag     Medication List    STOP taking these medications       naproxen sodium 220 MG tablet  Commonly known as: ANAPROX      TAKE these medications       amoxicillin-clavulanate 875-125 MG per tablet  Commonly known as: AUGMENTIN  Take 1 tablet by mouth 2 (two) times daily.     ergocalciferol 50000 UNITS capsule  Commonly known as: VITAMIN D2  Take 1 capsule (50,000 Units total) by mouth once a week.     HYDROcodone-acetaminophen 5-325 MG per tablet  Commonly known as: NORCO/VICODIN  Take 1-2 tablets by mouth every 6 (six) hours as needed for moderate pain or severe pain.     ondansetron 4 MG disintegrating tablet  Commonly known as: ZOFRAN ODT  Take 1 tablet (4 mg total) by mouth every 8 (eight) hours as needed for nausea or vomiting.     oxymetazoline 0.05 % nasal spray  Commonly known as: AFRIN  Place 1 spray into both nostrils daily as needed for congestion.     Vitamin D3 2000 UNITS capsule  Take 1 capsule (2,000 Units total) by mouth daily.         Follow-up Information    Follow up with [facility].   Why: nurse and aide   Contact information:   [address] [place] [address] [phone]       Follow up with ROSENBOWER,TODD J, MD In 3 weeks.   Specialty: General Surgery         F/u osteoporosis - was on Fosamax x 20 years  (Dr Altheimer) Jearl Klinefelter D was 24   Wt Readings from Last 3 Encounters:  05/24/14 114 lb (51.71 kg)  05/06/14 115 lb 8.3 oz (52.4 kg)  03/21/14 115 lb (52.164 kg)   BP Readings from Last 3 Encounters:  05/24/14 130/70  05/11/14 120/48  03/21/14 118/62      Review of Systems  Constitutional: Positive for fatigue and unexpected weight change. Negative for fever, chills, diaphoresis, activity change and appetite change.  HENT: Positive for postnasal drip and rhinorrhea. Negative for congestion, dental problem, ear pain, hearing loss, mouth sores, sinus pressure, sneezing, sore throat and voice change.   Eyes: Negative for pain and visual disturbance.  Respiratory: Positive for cough. Negative for chest tightness, wheezing and stridor.   Cardiovascular: Negative for chest pain, palpitations and leg swelling.  Gastrointestinal: Negative for nausea, vomiting, abdominal pain, blood in stool, abdominal distention and rectal pain.  Genitourinary: Negative for dysuria, frequency, hematuria, decreased urine volume, vaginal bleeding, vaginal discharge, difficulty urinating, vaginal pain and menstrual problem.  Musculoskeletal: Positive for arthralgias. Negative for back pain, joint swelling, gait problem and neck pain.  Skin: Negative for color change, rash and wound.  Neurological: Negative for dizziness, tremors, syncope, speech difficulty, weakness and light-headedness.  Hematological: Negative for adenopathy.  Psychiatric/Behavioral: Negative for suicidal  ideas, hallucinations, behavioral problems, confusion, sleep disturbance, dysphoric mood and decreased concentration. The patient is not nervous/anxious and is not hyperactive.        Objective:   Physical Exam  Constitutional: She appears  well-developed. No distress.  HENT:  Head: Normocephalic.  Nose: Nose normal.  Mouth/Throat: Oropharynx is clear and moist.  Eyes: Conjunctivae are normal. Pupils are equal, round, and reactive to light. Right eye exhibits no discharge. Left eye exhibits no discharge.  Neck: Normal range of motion. Neck supple. No JVD present. No tracheal deviation present. No thyromegaly present.  Cardiovascular: Normal rate, regular rhythm and normal heart sounds.   Pulmonary/Chest: No stridor. No respiratory distress. She has no wheezes.  Abdominal: Soft. Bowel sounds are normal. She exhibits no distension and no mass. There is no tenderness. There is no rebound and no guarding.  Musculoskeletal: She exhibits no edema or tenderness.  Lymphadenopathy:    She has no cervical adenopathy.  Neurological: She displays normal reflexes. No cranial nerve deficit. She exhibits normal muscle tone. Coordination normal.  Skin: No rash noted. No erythema.  Psychiatric: She has a normal mood and affect. Her behavior is normal. Judgment and thought content normal.  Colostomy bag  Lab Results  Component Value Date   WBC 9.8 05/24/2014   HGB 11.1* 05/24/2014   HCT 32.9* 05/24/2014   PLT 444.0* 05/24/2014   GLUCOSE 123* 05/24/2014   CHOL 186 04/14/2013   TRIG 193* 05/09/2014   HDL 51.90 04/14/2013   LDLCALC 104* 04/14/2013   ALT 10 05/24/2014   AST 13 05/24/2014   NA 135 05/24/2014   K 3.7 05/24/2014   CL 102 05/24/2014   CREATININE 0.75 05/24/2014   BUN 27* 05/24/2014   CO2 30 05/24/2014   TSH 2.63 05/24/2014   INR 1.14 05/03/2014   HGBA1C 6.0* 04/29/2014        Assessment & Plan:

## 2014-05-24 NOTE — Assessment & Plan Note (Signed)
Labs

## 2014-05-24 NOTE — Progress Notes (Signed)
Pre visit review using our clinic review tool, if applicable. No additional management support is needed unless otherwise documented below in the visit note. 

## 2014-05-24 NOTE — Assessment & Plan Note (Signed)
Better  

## 2014-05-24 NOTE — Patient Instructions (Signed)
    OK to use almond, coconut, rice or soy milk. "Almond breeze" brand tastes good.

## 2014-05-26 ENCOUNTER — Telehealth: Payer: Self-pay | Admitting: *Deleted

## 2014-05-26 MED ORDER — VITAMIN D3 50 MCG (2000 UT) PO CAPS: 4000.0000 [IU] | ORAL_CAPSULE | Freq: Every day | ORAL | Status: DC

## 2014-05-26 MED ORDER — IRON POLYSACCH CMPLX-B12-FA 150-0.025-1 MG PO CAPS: 1.0000 | ORAL_CAPSULE | Freq: Every day | ORAL | Status: DC

## 2014-05-26 NOTE — Telephone Encounter (Signed)
Ok to substitute Thx

## 2014-05-26 NOTE — Telephone Encounter (Signed)
rec fax from pharmacy stating Ferrex 150 mg is not covered and will cost $126. They are requesting to substitute with plain poly iron 150 mg. Please advise.

## 2014-05-26 NOTE — Telephone Encounter (Signed)
Pharmacy informed.

## 2014-06-28 ENCOUNTER — Encounter: Payer: Self-pay | Admitting: Internal Medicine

## 2014-07-05 ENCOUNTER — Ambulatory Visit: Payer: Medicare Other | Admitting: Internal Medicine

## 2014-08-03 ENCOUNTER — Telehealth: Payer: Self-pay

## 2014-08-03 NOTE — Telephone Encounter (Signed)
-----   Message from Irene Shipper, MD sent at 08/02/2014  4:31 PM EDT ----- Regarding: RE: Switching providers OK with me. Only seen for direct colonoscopy once.Haven't seen in 8 years. Sorry she struggled with the prep...  ----- Message -----    From: Algernon Huxley, RN    Sent: 08/02/2014   3:16 PM      To: Irene Shipper, MD Subject: Switching providers                            Dr. Henrene Pastor this pt wants to switch care to Dr. Hilarie Fredrickson, please see note below and advise.  Thanks, Vaughan Basta  ----- Message -----    From: Jerene Bears, MD    Sent: 08/02/2014   2:50 PM      To: Irene Shipper, MD, Algernon Huxley, RN  Got the below message from [employer] like pt needs surveillance colonoscopy before ostomy take down via rectum and ostomy.   Pt asking to switch to me Will need permission from [place]  ----- Message -----    From: [name], MD    Sent: 08/02/2014  11:59 AM      To: Jerene Bears, MD  Ulice Dash,  She had an urgent Hartmann procedure 3 months ago for complicated sigmoid diverticulitis.  At the time, she was 2 years overdo for her colonoscopy because she struggled with the prep.  She had seen Scarlette Shorts in the past but requested that I refer her to you for a colonoscopy through the colostomy and rectum.  She has a friend, who was [employer], who helps take care of her and comes to her appointments.  We sent the referral today.  Thx,  TR

## 2014-08-04 ENCOUNTER — Other Ambulatory Visit: Payer: Self-pay

## 2014-08-04 ENCOUNTER — Ambulatory Visit (INDEPENDENT_AMBULATORY_CARE_PROVIDER_SITE_OTHER): Payer: Medicare Other | Admitting: Internal Medicine

## 2014-08-04 ENCOUNTER — Encounter: Payer: Self-pay | Admitting: Internal Medicine

## 2014-08-04 VITALS — BP 148/72 | HR 78 | Wt 128.0 lb

## 2014-08-04 DIAGNOSIS — R1032 Left lower quadrant pain: Secondary | ICD-10-CM | POA: Diagnosis not present

## 2014-08-04 DIAGNOSIS — K5720 Diverticulitis of large intestine with perforation and abscess without bleeding: Secondary | ICD-10-CM

## 2014-08-04 DIAGNOSIS — K219 Gastro-esophageal reflux disease without esophagitis: Secondary | ICD-10-CM | POA: Diagnosis not present

## 2014-08-04 DIAGNOSIS — Z933 Colostomy status: Secondary | ICD-10-CM

## 2014-08-04 DIAGNOSIS — E559 Vitamin D deficiency, unspecified: Secondary | ICD-10-CM

## 2014-08-04 DIAGNOSIS — E538 Deficiency of other specified B group vitamins: Secondary | ICD-10-CM

## 2014-08-04 MED ORDER — LORATADINE 10 MG PO TABS: 10.0000 mg | ORAL_TABLET | Freq: Every day | ORAL | Status: DC

## 2014-08-04 MED ORDER — RANITIDINE HCL 150 MG PO TABS: 150.0000 mg | ORAL_TABLET | Freq: Two times a day (BID) | ORAL | Status: DC

## 2014-08-04 NOTE — Addendum Note (Signed)
Addended by: [name] on: 08/04/2014 02:12 PM   Modules accepted: Orders

## 2014-08-04 NOTE — Assessment & Plan Note (Signed)
Chronic  Vit D bid

## 2014-08-04 NOTE — Assessment & Plan Note (Signed)
Post-op x3 mo Doing better

## 2014-08-04 NOTE — Assessment & Plan Note (Signed)
OK to re-start FeSO4 po Labs in 2-3 mo

## 2014-08-04 NOTE — Assessment & Plan Note (Signed)
5/16 - likely causing cough Start Ranitidine 150 mg bid

## 2014-08-04 NOTE — Assessment & Plan Note (Signed)
F/u w/Dr Barkley Bruns and Dr Rhys Martini is planned

## 2014-08-04 NOTE — Progress Notes (Signed)
Pre visit review using our clinic review tool, if applicable. No additional management support is needed unless otherwise documented below in the visit note. 

## 2014-08-04 NOTE — Progress Notes (Signed)
   Subjective:     HPI    F/u anemia - not on iron now; diverticulitis w/perforation (Dr Barkley Bruns). F/u osteoporosis   Wt Readings from Last 3 Encounters:  08/04/14 128 lb (58.06 kg)  05/24/14 114 lb (51.71 kg)  05/06/14 115 lb 8.3 oz (52.4 kg)   BP Readings from Last 3 Encounters:  08/04/14 148/72  05/24/14 130/70  05/11/14 120/48      Review of Systems  Constitutional: Positive for fatigue and unexpected weight change. Negative for fever, chills, diaphoresis, activity change and appetite change.  HENT: Positive for postnasal drip and rhinorrhea. Negative for congestion, dental problem, ear pain, hearing loss, mouth sores, sinus pressure, sneezing, sore throat and voice change.   Eyes: Negative for pain and visual disturbance.  Respiratory: Positive for cough. Negative for chest tightness, wheezing and stridor.   Cardiovascular: Negative for chest pain, palpitations and leg swelling.  Gastrointestinal: Negative for nausea, vomiting, abdominal pain, blood in stool, abdominal distention and rectal pain.  Genitourinary: Negative for dysuria, frequency, hematuria, decreased urine volume, vaginal bleeding, vaginal discharge, difficulty urinating, vaginal pain and menstrual problem.  Musculoskeletal: Positive for arthralgias. Negative for back pain, joint swelling, gait problem and neck pain.  Skin: Negative for color change, rash and wound.  Neurological: Negative for dizziness, tremors, syncope, speech difficulty, weakness and light-headedness.  Hematological: Negative for adenopathy.  Psychiatric/Behavioral: Negative for suicidal ideas, hallucinations, behavioral problems, confusion, sleep disturbance, dysphoric mood and decreased concentration. The patient is not nervous/anxious and is not hyperactive.        Objective:   Physical Exam  Constitutional: She appears well-developed. No distress.  HENT:  Head: Normocephalic.  Nose: Nose normal.  Mouth/Throat: Oropharynx is  clear and moist.  Eyes: Conjunctivae are normal. Pupils are equal, round, and reactive to light. Right eye exhibits no discharge. Left eye exhibits no discharge.  Neck: Normal range of motion. Neck supple. No JVD present. No tracheal deviation present. No thyromegaly present.  Cardiovascular: Normal rate, regular rhythm and normal heart sounds.   Pulmonary/Chest: No stridor. No respiratory distress. She has no wheezes.  Abdominal: Soft. Bowel sounds are normal. She exhibits no distension and no mass. There is no tenderness. There is no rebound and no guarding.  Musculoskeletal: She exhibits no edema or tenderness.  Lymphadenopathy:    She has no cervical adenopathy.  Neurological: She displays normal reflexes. No cranial nerve deficit. She exhibits normal muscle tone. Coordination normal.  Skin: No rash noted. No erythema.  Psychiatric: She has a normal mood and affect. Her behavior is normal. Judgment and thought content normal.  Colostomy bag  Lab Results  Component Value Date   WBC 9.8 05/24/2014   HGB 11.1* 05/24/2014   HCT 32.9* 05/24/2014   PLT 444.0* 05/24/2014   GLUCOSE 123* 05/24/2014   CHOL 186 04/14/2013   TRIG 193* 05/09/2014   HDL 51.90 04/14/2013   LDLCALC 104* 04/14/2013   ALT 10 05/24/2014   AST 13 05/24/2014   NA 135 05/24/2014   K 3.7 05/24/2014   CL 102 05/24/2014   CREATININE 0.75 05/24/2014   BUN 27* 05/24/2014   CO2 30 05/24/2014   TSH 2.63 05/24/2014   INR 1.14 05/03/2014   HGBA1C 6.0* 04/29/2014        Assessment & Plan:

## 2014-08-09 ENCOUNTER — Ambulatory Visit (AMBULATORY_SURGERY_CENTER): Payer: Self-pay

## 2014-08-09 VITALS — Ht 62.25 in | Wt 128.6 lb

## 2014-08-09 DIAGNOSIS — K5720 Diverticulitis of large intestine with perforation and abscess without bleeding: Secondary | ICD-10-CM

## 2014-08-09 MED ORDER — NA SULFATE-K SULFATE-MG SULF 17.5-3.13-1.6 GM/177ML PO SOLN: ORAL | Status: DC

## 2014-08-09 NOTE — Progress Notes (Signed)
Per pt, no allergies to soy or egg products.Pt not taking any weight loss meds or using  O2 at home. 

## 2014-08-11 ENCOUNTER — Other Ambulatory Visit: Payer: Self-pay | Admitting: General Surgery

## 2014-08-11 ENCOUNTER — Other Ambulatory Visit: Payer: Self-pay | Admitting: *Deleted

## 2014-08-11 DIAGNOSIS — Z933 Colostomy status: Secondary | ICD-10-CM

## 2014-08-15 ENCOUNTER — Ambulatory Visit: Payer: Medicare Other

## 2014-08-15 ENCOUNTER — Other Ambulatory Visit: Payer: Medicare Other

## 2014-08-16 ENCOUNTER — Encounter: Payer: Medicare Other | Admitting: Internal Medicine

## 2014-08-17 ENCOUNTER — Encounter: Payer: Self-pay | Admitting: Internal Medicine

## 2014-08-24 ENCOUNTER — Encounter: Payer: Self-pay | Admitting: Internal Medicine

## 2014-08-24 ENCOUNTER — Ambulatory Visit (AMBULATORY_SURGERY_CENTER): Payer: Medicare Other | Admitting: Internal Medicine

## 2014-08-24 VITALS — BP 131/70 | HR 76 | Temp 98.6°F | Resp 18 | Ht 62.0 in | Wt 128.0 lb

## 2014-08-24 DIAGNOSIS — K635 Polyp of colon: Secondary | ICD-10-CM | POA: Diagnosis not present

## 2014-08-24 DIAGNOSIS — K5720 Diverticulitis of large intestine with perforation and abscess without bleeding: Secondary | ICD-10-CM | POA: Diagnosis not present

## 2014-08-24 DIAGNOSIS — D122 Benign neoplasm of ascending colon: Secondary | ICD-10-CM

## 2014-08-24 MED ORDER — FLEET ENEMA 7-19 GM/118ML RE ENEM
1.0000 | ENEMA | Freq: Once | RECTAL | Status: AC
  Administered 2014-08-24: 1 via RECTAL

## 2014-08-24 MED ORDER — SODIUM CHLORIDE 0.9 % IV SOLN: 500.0000 mL | INTRAVENOUS | Status: DC

## 2014-08-24 NOTE — Progress Notes (Signed)
Called to room to assist during endoscopic procedure.  Patient ID and intended procedure confirmed with present staff. Received instructions for my participation in the procedure from the performing physician.  

## 2014-08-24 NOTE — Op Note (Signed)
[place]  [address] [place], [id number]   COLONOSCOPY PROCEDURE REPORT  PATIENT: Kristen Castillo, Kristen Castillo  MR#: [id number] BIRTHDATE: 1940/08/10 , 73  yrs. old GENDER: female ENDOSCOPIST: Jerene Bears, MD REFERRED RW:ERXV Zella Richer, M.D. PROCEDURE DATE:  08/24/2014 PROCEDURE:   Colonoscopy, diagnostic First Screening Colonoscopy - Avg.  risk and is 50 yrs.  old or older - No.  Prior Negative Screening - Now for repeat screening. N/A  History of Adenoma - Now for follow-up colonoscopy & has been > or = to 3 yrs.  N/A  Polyps removed today? Yes ASA CLASS:   Class III INDICATIONS:history of perforated diverticulitis, s/p sigmoidectomy with end descending colostomy, surgical consideration of ostomy takedown with re-anastomosis. MEDICATIONS: Monitored anesthesia care and Propofol 200 mg IV  DESCRIPTION OF PROCEDURE:   After the risks benefits and alternatives of the procedure were thoroughly explained, informed consent was obtained.  The digital rectal exam revealed no rectal mass.   The LB [id number]  endoscope was introduced through the anus and advanced to the cecum, which was identified by both the appendix and ileocecal valve. No adverse events experienced. The quality of the prep was good.  (Suprep was used)  The instrument was then slowly withdrawn as the colon was fully examined. Estimated blood loss is zero unless otherwise noted in this procedure report.      COLON FINDINGS: Colonoscopy first through descending colostomy. Gentle digital exam of the ostomy revealed patency. The pediatric colonoscope was easily advanced to the cecum. Three sessile polyps ranging from 3 to 39mm in size were found in the ascending colon. Polypectomies were performed with a cold snare.  The resection was complete, the polyp tissue was completely retrieved and sent to histology.   There was very mild scattered diverticulosis noted at the splenic flexure.  No  diverticulosis was seen in the distal descending colon approaching the ostomy.  With the patient still sedated she was rolled to the left lateral position, digital rectal exam revealed no masses. The scope was gently inserted and advanced approximately 15-20 cm to the distal sigmoid. A blind in was seen with blue suture material. No polyps or diverticulosis seen. Very mild erythema likely related to diversion colonic inflammation. There was a mucous ball in the rectum which precluded complete visualization of the rectum.  Retroflexion was not performed. The time to cecum = 3 min, 11 sec.      Withdrawal time =  14 min, 12 sec        The scope was withdrawn and the procedure completed. COMPLICATIONS: There were no immediate complications.  ENDOSCOPIC IMPRESSION: 1.   Three sessile polyps ranging from 3 to 44mm in size were found in the ascending colon; polypectomies were performed with a cold snare 2.   There was mild diverticulosis noted at the splenic flexure  RECOMMENDATIONS: 1.  Await pathology results 2.  Okay for barium enema study tomorrow as ordered by Dr. Zella Richer 3.  Follow-up with Dr.  Zella Richer 4.  You will receive a letter within 1-2 weeks with the results of your biopsy as well as final recommendations.  Please call my office if you have not received a letter after 3 weeks.  eSigned:  Jerene Bears, MD 08/24/2014 5:00 PM   cc: [name], MD, The Patient, and [facility] [place].  Plotnikov, MD   PATIENT NAME:  Kristen Castillo, Kristen Castillo MR#: [id number]

## 2014-08-24 NOTE — Patient Instructions (Addendum)
YOU HAD AN ENDOSCOPIC PROCEDURE TODAY AT [facility]:   Refer to the procedure report that was given to you for any specific questions about what was found during the examination.  If the procedure report does not answer your questions, please call your gastroenterologist to clarify.  If you requested that your care partner not be given the details of your procedure findings, then the procedure report has been included in a sealed envelope for you to review at your convenience later.  YOU SHOULD EXPECT: Some feelings of bloating in the abdomen. Passage of more gas than usual.  Walking can help get rid of the air that was put into your GI tract during the procedure and reduce the bloating. If you had a lower endoscopy (such as a colonoscopy or flexible sigmoidoscopy) you may notice spotting of blood in your stool or on the toilet paper. If you underwent a bowel prep for your procedure, you may not have a normal bowel movement for a few days.  Please Note:  You might notice some irritation and congestion in your nose or some drainage.  This is from the oxygen used during your procedure.  There is no need for concern and it should clear up in a day or so.  SYMPTOMS TO REPORT IMMEDIATELY:   Following lower endoscopy (colonoscopy or flexible sigmoidoscopy):  Excessive amounts of blood in the stool  Significant tenderness or worsening of abdominal pains  Swelling of the abdomen that is new, acute  Fever of 100F or higher   For urgent or emergent issues, a gastroenterologist can be reached at any hour by calling [phone].   DIET: Your first meal following the procedure should be a small meal and then it is ok to progress to your normal diet. Heavy or fried foods are harder to digest and may make you feel nauseous or bloated.  Likewise, meals heavy in dairy and vegetables can increase bloating.  Drink plenty of fluids but you should avoid alcoholic beverages for 24  hours.  ACTIVITY:  You should plan to take it easy for the rest of today and you should NOT DRIVE or use heavy machinery until tomorrow (because of the sedation medicines used during the test).    FOLLOW UP: Our staff will call the number listed on your records the next business day following your procedure to check on you and address any questions or concerns that you may have regarding the information given to you following your procedure. If we do not reach you, we will leave a message.  However, if you are feeling well and you are not experiencing any problems, there is no need to return our call.  We will assume that you have returned to your regular daily activities without incident.  If any biopsies were taken you will be contacted by phone or by letter within the next 1-3 weeks.  Please call us at [phone] if you have not heard about the biopsies in 3 weeks.    SIGNATURES/CONFIDENTIALITY: You and/or your care partner have signed paperwork which will be entered into your electronic medical record.  These signatures attest to the fact that that the information above on your After Visit Summary has been reviewed and is understood.  Full responsibility of the confidentiality of this discharge information lies with you and/or your care-partner.  Polyp and diverticulosis information given.

## 2014-08-24 NOTE — Progress Notes (Signed)
Report to PACU, RN, vss, BBS= Clear.  

## 2014-08-24 NOTE — Progress Notes (Signed)
Ostomy bag repaced by Pt. Private duty nurse.  Pt. Denies any abdomina discomfort.

## 2014-08-24 NOTE — Progress Notes (Signed)
Spoke with Dr. Hilarie Fredrickson, who asks that pt do a fleets enema to clear out any mucous to the rectal area.  Explained this to pt who is agreeable to this.  1/2 of fleets enema instilled.  Pt states she cannot feel any fluid going into rectum area.  Sat onto toilet and told to hold fluid until she feel the needs to push it out.  She sat about 5 minutes and states she has no feeling if she needs to push fluid out, so I did tell her to push. Small amt of mucous returned into toiled

## 2014-08-25 ENCOUNTER — Ambulatory Visit
Admission: RE | Admit: 2014-08-25 | Discharge: 2014-08-25 | Disposition: A | Discharge status: Home / Self Care | Payer: Medicare Other | Source: Ambulatory Visit | Attending: General Surgery | Admitting: General Surgery

## 2014-08-25 ENCOUNTER — Telehealth: Payer: Self-pay | Admitting: *Deleted

## 2014-08-25 DIAGNOSIS — Z933 Colostomy status: Secondary | ICD-10-CM

## 2014-08-25 NOTE — Telephone Encounter (Signed)
Left message on f/u callback 

## 2014-09-01 ENCOUNTER — Encounter: Payer: Self-pay | Admitting: Internal Medicine

## 2014-09-14 ENCOUNTER — Encounter: Payer: Self-pay | Admitting: General Surgery

## 2014-09-14 NOTE — Progress Notes (Signed)
Kristen Castillo. Kristen Castillo 09/14/2014 11:10 AM Location: [facility] Patient #: [id number] DOB: 05/09/1940 Married / Language: [name] / Race: White Female History of Present Illness [name]; 09/14/2014 12:53 PM) Patient words: reck.  The patient is a 74 year old female    Note:She underwent urgent Hartmann procedure with drainage of pelvic abscesses for perforated diverticulitis with multiple intra-abdominal abscesses 05/03/14. Her wound has healed. 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 She is eating well and has gained some weight above her baseline. She is starting to exercise more. Her energy level is at baseline. She is here to discuss colostomy closure. Barium enema demonstrated some scattered diverticular disease in the descending and transverse colon. It also demonstrated some diverticular disease in the colonic stump in the pelvis. There was a filling defect in the rectum. She passed a large mucus ball which would explain the filling defect soon after the barium enema. No tumors on colonoscopy. Her husband is with her today.  Allergies ([name], CMA; 09/14/2014 11:12 AM) Sulfacetamide *CHEMICALS* Codeine Phosphate *ANALGESICS - OPIOID*  Medication History ([name], CMA; 09/14/2014 11:12 AM) Calcium Gluconate (600MG  Tablet, 1 Oral bid) Active. Colace (100MG  Capsule, 2 Oral bid) Active. Delsym Cough/Cold Daytime (5-10-200-325MG /10ML Liquid, Oral prn allergies) Active. Saline Nasal Spray (Nasal prn) Active. Multiple Vitamin (1 (one) Oral daily) Active. Vitamin D (2000UNIT Tablet, 1 (one) Oral two times daily) Active. Medications Reconciled    Vitals ([name] CMA; 09/14/2014 11:11 AM) 09/14/2014 11:11 AM Weight: 132.8 lb Height: 62in Body Surface Area: 1.62 m Body Mass Index: 24.29 kg/m Temp.: 97.24F(Temporal)  Pulse: 80 (Regular)  BP: 126/76 (Sitting, Left Arm, Standard)     Physical Exam [name]; 09/14/2014  12:54 PM)  The physical exam findings are as follows: Note:General-well-developed well-nourished in no acute distress.  Abdomen-well-healed midline incision. Colostomy is low but a fullness around it but is viable. Soft brown stool was present in the bag.    Assessment & Plan [name]; 09/14/2014 12:54 PM)  DIVERTICULITIS OF COLON WITH PERFORATION (562.11  K57.20)  STATUS POST HARTMANN PROCEDURE (V44.3  Z93.3) Impression: She is doing well. Has some residual sigmoid colon on rectal stump demonstrating diverticular disease. Has scattered diverticular disease in the descending and transverse colon She has gained a significant amount of weight compared to her baseline. She would like to proceed with colostomy closure.  Plan: Increase exercise to try to lose some of that weight. Lean protein source. High fiber diet. Avoid red meat. Laparoscopic assisted colostomy closure some time in August. I have explained the procedure and risks of colon resection. Risks include but are not limited to bleeding, infection, wound problems, anesthesia, anastomotic leak, inability to reverse the colostomy, need for reoperative surgery, injury to intraabominal organs (such as intestine, spleen, kidney, bladder, ureter, etc.), ileus, irregular bowel habits. She seems to understand and agrees with the plan. Preoperative bowel prep instructions have been given to her.  [name], MD

## 2014-10-04 ENCOUNTER — Other Ambulatory Visit (INDEPENDENT_AMBULATORY_CARE_PROVIDER_SITE_OTHER): Payer: Medicare Other

## 2014-10-04 ENCOUNTER — Encounter: Payer: Self-pay | Admitting: Internal Medicine

## 2014-10-04 ENCOUNTER — Telehealth: Payer: Self-pay | Admitting: Internal Medicine

## 2014-10-04 ENCOUNTER — Other Ambulatory Visit: Payer: Self-pay | Admitting: Internal Medicine

## 2014-10-04 ENCOUNTER — Ambulatory Visit (INDEPENDENT_AMBULATORY_CARE_PROVIDER_SITE_OTHER)
Admission: RE | Admit: 2014-10-04 | Discharge: 2014-10-04 | Disposition: A | Discharge status: Home / Self Care | Payer: Medicare Other | Source: Ambulatory Visit | Attending: Internal Medicine | Admitting: Internal Medicine

## 2014-10-04 ENCOUNTER — Ambulatory Visit (INDEPENDENT_AMBULATORY_CARE_PROVIDER_SITE_OTHER): Payer: Medicare Other | Admitting: Internal Medicine

## 2014-10-04 VITALS — BP 160/70 | HR 82 | Temp 97.8°F | Wt 131.0 lb

## 2014-10-04 DIAGNOSIS — J189 Pneumonia, unspecified organism: Secondary | ICD-10-CM | POA: Diagnosis not present

## 2014-10-04 DIAGNOSIS — R059 Cough, unspecified: Secondary | ICD-10-CM

## 2014-10-04 DIAGNOSIS — R05 Cough: Secondary | ICD-10-CM | POA: Diagnosis not present

## 2014-10-04 DIAGNOSIS — Z658 Other specified problems related to psychosocial circumstances: Secondary | ICD-10-CM | POA: Diagnosis not present

## 2014-10-04 DIAGNOSIS — F439 Reaction to severe stress, unspecified: Secondary | ICD-10-CM

## 2014-10-04 DIAGNOSIS — R1013 Epigastric pain: Secondary | ICD-10-CM

## 2014-10-04 LAB — CBC WITH DIFFERENTIAL/PLATELET
Basophils Absolute: 0 10*3/uL (ref 0.0–0.1)
Basophils Relative: 0.3 % (ref 0.0–3.0)
EOS PCT: 3.1 % (ref 0.0–5.0)
Eosinophils Absolute: 0.3 10*3/uL (ref 0.0–0.7)
HEMATOCRIT: 42 % (ref 36.0–46.0)
Hemoglobin: 14.1 g/dL (ref 12.0–15.0)
LYMPHS ABS: 1.6 10*3/uL (ref 0.7–4.0)
LYMPHS PCT: 18.9 % (ref 12.0–46.0)
MCHC: 33.7 g/dL (ref 30.0–36.0)
MCV: 86.4 fl (ref 78.0–100.0)
MONO ABS: 0.7 10*3/uL (ref 0.1–1.0)
Monocytes Relative: 8.9 % (ref 3.0–12.0)
NEUTROS PCT: 68.8 % (ref 43.0–77.0)
Neutro Abs: 5.7 10*3/uL (ref 1.4–7.7)
Platelets: 265 10*3/uL (ref 150.0–400.0)
RBC: 4.87 Mil/uL (ref 3.87–5.11)
RDW: 15.8 % — AB (ref 11.5–15.5)
WBC: 8.3 10*3/uL (ref 4.0–10.5)

## 2014-10-04 LAB — BASIC METABOLIC PANEL
BUN: 29 mg/dL — ABNORMAL HIGH (ref 6–23)
CHLORIDE: 104 meq/L (ref 96–112)
CO2: 28 mEq/L (ref 19–32)
Calcium: 10 mg/dL (ref 8.4–10.5)
Creatinine, Ser: 0.85 mg/dL (ref 0.40–1.20)
GFR: 69.52 mL/min (ref >60.00)
Glucose, Bld: 96 mg/dL (ref 70–99)
Potassium: 4.3 mEq/L (ref 3.5–5.1)
SODIUM: 140 meq/L (ref 135–145)

## 2014-10-04 LAB — SEDIMENTATION RATE: Sed Rate: 21 mm/hr (ref 0–22)

## 2014-10-04 MED ORDER — ALPRAZOLAM 0.25 MG PO TABS
0.2500 mg | ORAL_TABLET | Freq: Two times a day (BID) | ORAL | Status: DC | PRN
Start: 2014-10-04 — End: 2015-06-16

## 2014-10-04 MED ORDER — LEVOFLOXACIN 500 MG PO TABS: 500.0000 mg | ORAL_TABLET | Freq: Every day | ORAL | Status: DC

## 2014-10-04 NOTE — Assessment & Plan Note (Signed)
partial SBO most likely [facility]  Simple diet F/u w/Dr Barkley Bruns

## 2014-10-04 NOTE — Addendum Note (Signed)
Addended by: Cassandria Anger on: 10/04/2014 01:30 PM   Modules accepted: Orders

## 2014-10-04 NOTE — Progress Notes (Signed)
Subjective:  Patient ID: Kristen Castillo, female    DOB: 02-Jun-1940  Age: 74 y.o. MRN: [id number]  CC: Abdominal Pain and Cough   HPI Kristen Castillo presents for abd pain starting over the week. Took Mylanta, Zantac, Prilosec. Mylanta helped. Cough has started on the same day. No SOB. Appetite is ok. Watery stool on weekend. Feeling better.  Outpatient Prescriptions Prior to Visit  Medication Sig Dispense Refill  . calcium carbonate (OS-CAL) 600 MG TABS tablet Take 600 mg by mouth 2 (two) times daily with a meal.    . Cholecalciferol 1000 UNITS tablet Take 2 tablets by mouth 2 (two) times daily.     Marland Kitchen docusate sodium (COLACE) 100 MG capsule Take 200 mg by mouth 2 (two) times daily.    . Iron Polysacch Cmplx-B12-FA 150-0.025-1 MG CAPS Take 1 capsule by mouth daily. 90 each 1  . Multiple Vitamin (MULTIVITAMIN) tablet Take 1 tablet by mouth daily.    . ranitidine (ZANTAC) 150 MG tablet Take 1 tablet (150 mg total) by mouth 2 (two) times daily. 60 tablet 5  . dextromethorphan (DELSYM) 30 MG/5ML liquid Take by mouth as needed for cough.    . loratadine (CLARITIN) 10 MG tablet Take 1 tablet (10 mg total) by mouth daily. (Patient not taking: Reported on 10/04/2014) 100 tablet 3  . ondansetron (ZOFRAN ODT) 4 MG disintegrating tablet Take 1 tablet (4 mg total) by mouth every 8 (eight) hours as needed for nausea or vomiting. (Patient not taking: Reported on 10/04/2014) 20 tablet 0   No facility-administered medications prior to visit.    ROS Review of Systems  Constitutional: Negative for chills, activity change, appetite change, fatigue and unexpected weight change.  HENT: Negative for congestion, mouth sores and sinus pressure.   Eyes: Negative for visual disturbance.  Respiratory: Positive for cough. Negative for chest tightness and shortness of breath.   Gastrointestinal: Positive for abdominal pain, diarrhea, blood in stool and abdominal distention. Negative for nausea and vomiting.    Genitourinary: Negative for frequency, difficulty urinating and vaginal pain.  Musculoskeletal: Negative for back pain and gait problem.  Skin: Negative for pallor and rash.  Neurological: Negative for dizziness, tremors, weakness, numbness and headaches.  Psychiatric/Behavioral: Negative for confusion and sleep disturbance.    Objective:  BP 160/70 mmHg  Pulse 82  Temp(Src) 97.8 F (36.6 C) (Oral)  Wt 131 lb (59.421 kg)  SpO2 96%  BP Readings from Last 3 Encounters:  10/04/14 160/70  08/24/14 131/70  08/04/14 148/72    Wt Readings from Last 3 Encounters:  10/04/14 131 lb (59.421 kg)  08/24/14 128 lb (58.06 kg)  08/09/14 128 lb 9.6 oz (58.333 kg)    Physical Exam  Constitutional: She appears well-developed. No distress.  HENT:  Head: Normocephalic.  Right Ear: External ear normal.  Left Ear: External ear normal.  Nose: Nose normal.  Mouth/Throat: Oropharynx is clear and moist.  Eyes: Conjunctivae are normal. Pupils are equal, round, and reactive to light. Right eye exhibits no discharge. Left eye exhibits no discharge.  Neck: Normal range of motion. Neck supple. No JVD present. No tracheal deviation present. No thyromegaly present.  Cardiovascular: Normal rate, regular rhythm and normal heart sounds.   Pulmonary/Chest: No stridor. No respiratory distress. She has no wheezes.  Abdominal: Soft. Bowel sounds are normal. She exhibits distension. She exhibits no mass. There is no tenderness. There is no rebound and no guarding.  Musculoskeletal: She exhibits no edema or tenderness.  Lymphadenopathy:  She has no cervical adenopathy.  Neurological: She displays normal reflexes. No cranial nerve deficit. She exhibits normal muscle tone. Coordination normal.  Skin: No rash noted. No erythema.  Psychiatric: She has a normal mood and affect. Her behavior is normal. Judgment and thought content normal.  colost bag is ok, LLQ bulge  Lab Results  Component Value Date   WBC 9.8  05/24/2014   HGB 11.1* 05/24/2014   HCT 32.9* 05/24/2014   PLT 444.0* 05/24/2014   GLUCOSE 123* 05/24/2014   CHOL 186 04/14/2013   TRIG 193* 05/09/2014   HDL 51.90 04/14/2013   LDLCALC 104* 04/14/2013   ALT 10 05/24/2014   AST 13 05/24/2014   NA 135 05/24/2014   K 3.7 05/24/2014   CL 102 05/24/2014   CREATININE 0.75 05/24/2014   BUN 27* 05/24/2014   CO2 30 05/24/2014   TSH 2.63 05/24/2014   INR 1.14 05/03/2014   HGBA1C 6.0* 04/29/2014    Dg Colon W/cm Thru Colostomy  08/25/2014   CLINICAL DATA:  History of acute diverticulitis with Hartmann's pouch and ostomy for re- anastomosis  EXAM: SINGLE COLUMN BARIUM ENEMA  TECHNIQUE: Initial scout AP supine abdominal image obtained to insure adequate colon cleansing. Barium was introduced into the colon in a retrograde fashion and refluxed from the rectum to the cecum. Spot images of the colon followed by overhead radiographs were obtained.  FLUOROSCOPY TIME:  Radiation Exposure Index (as provided by the fluoroscopic device): 101 Gy per sq cm  If the device does not provide the exposure index:  Fluoroscopy Time:  3 minutes 24 seconds  Number of Acquired Images:  COMPARISON:  CT abdomen pelvis of 05/02/2014  FINDINGS: A preliminary film of the abdomen shows a nonspecific bowel gas pattern. No opaque calculi are seen. Probable calcified granulomas are noted in the region of the spleen most consistent with prior granulomatous disease.  Initially barium was introduced via the rectum with filling of the Hartmann's pouch. There is a persistent filling defect along the right lateral rectal wall, which does not change position. However the patient did undergo colonoscopy recently and this area would have been evaluated. This probably represents adherent feces but correlation with the colonoscopy results is recommended. There are diverticula within the sigmoid colon several of which are at the blind end of the Hartmann's pouch. Post evacuation film show no other  abnormality.  Barium was then introduced via the ostomy with some leakage of contrast into the bag. There are diverticula scattered throughout the descending colon, splenic flexure of colon, as well as a few diverticula within the transverse colon. No right-sided diverticula are noted. The cecum is somewhat medially positioned possibly on a long mesentery. A post evacuation film shows no other abnormality. There is some residual barium within the proximal colon.  IMPRESSION: 1. Within the Hartmann's pouch there are diverticula primarily near the blind end of the pouch. No extravasation is seen. 2. There are diverticula scattered throughout the descending colon, splenic flexure colon, with a few in the transverse colon as well. 3. Persistent filling defect along the right lateral wall of the rectum most likely represents adherent feces since the patient did undergo colonoscopy recently. Correlate with colonoscopy results. 4. The cecum is somewhat medially positioned probably on a long mesentery.   Electronically Signed   By: Ivar Drape M.D.   On: 08/25/2014 11:00    Assessment & Plan:   Kristen Castillo was seen today for abdominal pain and cough.  Diagnoses and all orders  for this visit:  Abdominal pain, epigastric Orders: -     DG Abd 1 View; Future -     DG Chest 2 View -     Basic metabolic panel; Future -     CBC with Differential/Platelet; Future -     Sedimentation rate; Future -     Ambulatory referral to General Surgery  Cough Orders: -     DG Chest 2 View -     Basic metabolic panel; Future -     CBC with Differential/Platelet; Future -     Sedimentation rate; Future  Stress  Other orders -     ALPRAZolam (XANAX) 0.25 MG tablet; Take 1 tablet (0.25 mg total) by mouth 2 (two) times daily as needed for anxiety.  I have discontinued Ms. Vaca's metroNIDAZOLE. I am also having her start on ALPRAZolam. Additionally, I am having her maintain her ondansetron, Iron Polysacch Cmplx-B12-FA,  Cholecalciferol, docusate sodium, calcium carbonate, dextromethorphan, loratadine, ranitidine, multivitamin, and neomycin.  Meds ordered this encounter  Medications  . DISCONTD: metroNIDAZOLE (FLAGYL) 500 MG tablet    Sig:     Refill:  0  . neomycin (MYCIFRADIN) 500 MG tablet    Sig:     Refill:  0  . ALPRAZolam (XANAX) 0.25 MG tablet    Sig: Take 1 tablet (0.25 mg total) by mouth 2 (two) times daily as needed for anxiety.    Dispense:  30 tablet    Refill:  5     Follow-up: Return in about 3 months (around 01/04/2015) for a follow-up visit.  Walker Kehr, MD

## 2014-10-04 NOTE — Assessment & Plan Note (Signed)
Xanax prn 

## 2014-10-04 NOTE — Patient Instructions (Signed)
GO back to simple diet: small meals more often. Lactaid with yogurt Go to ER if worse

## 2014-10-04 NOTE — Telephone Encounter (Signed)
Patient is scheduled for colostomy reversal in the end of August.  She was seen by her primary care today for chest pain and was diagnosed with pneumonia.  She has chest pain from time to time and has to take mylanta for heartburn.  Currently taking zantac.  She is scheduled to see Nicoletta Ba PA on 10/18/14 9:30.  She wants to see if her symptoms resolve with the antibiotics for the pneumonia.  She will call back to cancel if her symptoms resolve

## 2014-10-04 NOTE — Assessment & Plan Note (Signed)
7/16 RML - doubt aspiration CXR in 3 wks Levaquin x 10 d Florastor po

## 2014-10-04 NOTE — Progress Notes (Signed)
Pre visit review using our clinic review tool, if applicable. No additional management support is needed unless otherwise documented below in the visit note. 

## 2014-10-05 ENCOUNTER — Telehealth: Payer: Self-pay | Admitting: *Deleted

## 2014-10-05 MED ORDER — HYDROCODONE-ACETAMINOPHEN 5-325 MG PO TABS: 0.5000 | ORAL_TABLET | Freq: Two times a day (BID) | ORAL | Status: DC | PRN

## 2014-10-05 NOTE — Telephone Encounter (Signed)
OK Hydrocodone Thx

## 2014-10-05 NOTE — Telephone Encounter (Signed)
Rx is upfront for p/u. Pt informed

## 2014-10-05 NOTE — Telephone Encounter (Signed)
Pt's husband came in requesting pain meds for pt now. He states you offered at her OV and she declined. However, he states she needs them now. Please advise.

## 2014-10-18 ENCOUNTER — Encounter: Payer: Self-pay | Admitting: Physician Assistant

## 2014-10-18 ENCOUNTER — Ambulatory Visit (INDEPENDENT_AMBULATORY_CARE_PROVIDER_SITE_OTHER): Payer: Medicare Other | Admitting: Physician Assistant

## 2014-10-18 VITALS — BP 150/82 | HR 80 | Ht 62.25 in | Wt 134.0 lb

## 2014-10-18 DIAGNOSIS — R12 Heartburn: Secondary | ICD-10-CM

## 2014-10-18 DIAGNOSIS — R1012 Left upper quadrant pain: Secondary | ICD-10-CM

## 2014-10-18 DIAGNOSIS — R1011 Right upper quadrant pain: Secondary | ICD-10-CM

## 2014-10-18 DIAGNOSIS — R9389 Abnormal findings on diagnostic imaging of other specified body structures: Secondary | ICD-10-CM

## 2014-10-18 DIAGNOSIS — R938 Abnormal findings on diagnostic imaging of other specified body structures: Secondary | ICD-10-CM

## 2014-10-18 DIAGNOSIS — Z933 Colostomy status: Secondary | ICD-10-CM | POA: Diagnosis not present

## 2014-10-18 DIAGNOSIS — R1013 Epigastric pain: Secondary | ICD-10-CM | POA: Diagnosis not present

## 2014-10-18 MED ORDER — PANTOPRAZOLE SODIUM 40 MG PO TBEC: 40.0000 mg | DELAYED_RELEASE_TABLET | Freq: Every day | ORAL | Status: DC

## 2014-10-18 NOTE — Patient Instructions (Signed)
We sent a prescription to [facility] for Pantoprazole sodium 40 mg.  Get the follow up chest x-ray next week with Dr. Alain Marion.    You have been scheduled for a CT scan of the abdomen and pelvis at [facility] ([address]---this is in the same building as Press photographer).   You are scheduled on Thursday 10-20-2014 at 10:30 am . You should arrive at 10:15 am  for registration. Please follow the written instructions below on the day of your exam:  WARNING: IF YOU ARE ALLERGIC TO IODINE/X-RAY DYE, PLEASE NOTIFY RADIOLOGY IMMEDIATELY AT [phone]! YOU WILL BE GIVEN A 13 HOUR PREMEDICATION PREP.  1) Do not eat or drink anything after  6:30 am  (4 hours prior to your test) 2) You have been given 2 bottles of oral contrast to drink. The solution may taste  better if refrigerated, but do NOT add ice or any other liquid to this solution. Shake  well before drinking.    Drink 1 bottle of contrast @  8:30 am  (2 hours prior to your exam)  Drink 1 bottle of contrast @ 9:30 am  (1 hour prior to your exam)  You may take any medications as prescribed with a small amount of water except for the following: Metformin, Glucophage, Glucovance, Avandamet, Riomet, Fortamet, Actoplus Met, Janumet, Glumetza or Metaglip. The above medications must be held the day of the exam AND 48 hours after the exam.  The purpose of you drinking the oral contrast is to aid in the visualization of your intestinal tract. The contrast solution may cause some diarrhea. Before your exam is started, you will be given a small amount of fluid to drink. Depending on your individual set of symptoms, you may also receive an intravenous injection of x-ray contrast/dye. Plan on being at [facility] for 30 minutes or long, depending on the type of exam you are having performed.  If you have any questions regarding your exam or if you need to reschedule, you may call the CT department at  [phone] between the hours of 8:00 am and 5:00 pm, Monday-Friday.  ________________________________________________________________________

## 2014-10-18 NOTE — Progress Notes (Addendum)
Patient ID: Kristen Castillo, female   DOB: [birth year], 74 y.o.   MRN: [id number]   Subjective:    Patient ID: Kristen Castillo, female    DOB: 08-May-1940, 74 y.o.   MRN: [id number]  HPI Kristen Castillo is a pleasant 74 year old white female known to Dr.Pyrtle who had undergone colonoscopy in June 2016 in anticipation of reversal of colostomy which was done for perforated diverticulitis in February 2016. She had a sigmoidectomy and and descending colostomy. She was found to have 3 descending colon polyps but Path on the polyps showed polypoid colonic mucosa only no adenomatous change. Showed mild diverticulosis in the splenic flexure. Patient says she hasn't been feeling well over the past month and has 3 specific types of pain that she is concerned about. She had also seen Dr. Jari Pigg cough on July 26 for complaints of chest pain and burning in her chest and was diagnosed with a right-sided pneumonia. She's been treated with a course of antibiotics which she finished and is to have a follow-up chest x-ray to ensure no underlying lesion. She complains of a subxiphoid pain that has been intermittent and gnawing in nature at times her other pain is a tight "" sensation in her chest which she thinks may be heartburn though she's not had problems with this in the past. She has taken Mylanta and has found this somewhat helpful. She feels that eating aggravates this most of the time age or odynophagia. She also has a mid abdominal pain which has been intermittent and that sometimes sharp. She uses oxycodone for this every 12 hours if needed. She feels that the area around her colostomy has definitely increased in size and feels heavy at times. She says she feels best lying down and worse with sitting or bending. She is scheduled for colostomy reversal on August 30.  Review of Systems Pertinent positive and negative review of systems were noted in the above HPI section.  All other review of systems was otherwise  negative.  Outpatient Encounter Prescriptions as of 10/18/2014  Medication Sig  . ALPRAZolam (XANAX) 0.25 MG tablet Take 1 tablet (0.25 mg total) by mouth 2 (two) times daily as needed for anxiety.  . calcium carbonate (OS-CAL) 600 MG TABS tablet Take 600 mg by mouth 2 (two) times daily with a meal.  . Cholecalciferol 1000 UNITS tablet Take 2 tablets by mouth 2 (two) times daily.   Marland Kitchen dextromethorphan (DELSYM) 30 MG/5ML liquid Take by mouth as needed for cough.  . docusate sodium (COLACE) 100 MG capsule Take 200 mg by mouth 2 (two) times daily.  Marland Kitchen HYDROcodone-acetaminophen (NORCO/VICODIN) 5-325 MG per tablet Take 0.5-1 tablets by mouth 2 (two) times daily as needed for moderate pain or severe pain.  . Iron Polysacch Cmplx-B12-FA 150-0.025-1 MG CAPS Take 1 capsule by mouth daily.  Marland Kitchen loratadine (CLARITIN) 10 MG tablet Take 1 tablet (10 mg total) by mouth daily.  . Multiple Vitamin (MULTIVITAMIN) tablet Take 1 tablet by mouth daily.  . ondansetron (ZOFRAN ODT) 4 MG disintegrating tablet Take 1 tablet (4 mg total) by mouth every 8 (eight) hours as needed for nausea or vomiting.  . ranitidine (ZANTAC) 150 MG tablet Take 1 tablet (150 mg total) by mouth 2 (two) times daily.  . pantoprazole (PROTONIX) 40 MG tablet Take 1 tablet (40 mg total) by mouth daily.  . [DISCONTINUED] levofloxacin (LEVAQUIN) 500 MG tablet Take 1 tablet (500 mg total) by mouth daily.  . [DISCONTINUED] neomycin (MYCIFRADIN) 500 MG tablet  No facility-administered encounter medications on file as of 10/18/2014.   Allergies  Allergen Reactions  . Codeine Nausea And Vomiting  . Sulfonamide Derivatives Other (See Comments)    Childhood allergy; reaction unknown   Patient Active Problem List   Diagnosis Date Noted  . Abdominal pain, epigastric 10/04/2014  . Stress 10/04/2014  . CAP (community acquired pneumonia) 10/04/2014  . Moderate malnutrition 05/24/2014  . Anemia 05/24/2014  . Mild malnutrition 05/08/2014  . Abdominal  pain   . Dehydration   . Migraine without aura and without status migrainosus, not intractable   . Sigmoid diverticulitis 05/03/2014  . Difficulty tolerating colonoscopy bowel prep 04/29/2014  . Anxiety, mild 04/29/2014  . 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 Diverticulitis of colon with perforation 04/28/2014  . GERD (gastroesophageal reflux disease) 04/28/2014  . Cerumen impaction 03/21/2014  . Cough 03/21/2014  . Well adult exam 03/21/2014  . 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 Rectal discharge 09/07/2011  . Constipation - functional 09/07/2011  . Nephrolithiasis 01/08/2011  . Diverticulosis of colon 01/08/2011  . HEMATURIA, MICROSCOPIC, HX OF 01/28/2008  . Vitamin D deficiency 04/01/2007  . HAIR LOSS 04/01/2007  . TREMOR 04/01/2007  . Allergic rhinitis 03/31/2007  . OSTEOARTHRITIS 03/31/2007  . MIGRAINES, HX OF 03/31/2007  . Osteoporosis 11/12/2006   History   Social History  . Marital Status: Married    Spouse Name: N/A  . Number of Children: N/A  . Years of Education: N/A   Occupational History  . Not on file.   Social History Main Topics  . Smoking status: Never Smoker   . Smokeless tobacco: Never Used  . Alcohol Use: No  . Drug Use: No  . Sexual Activity: Not on file   Other Topics Concern  . Not on file   Kristen Castillo- education   Married '64   Work: Pharmacist, hospital, [employer] of [employer] in [place], retired   2 adopted sons '69, '71   Marriage in good health- Life is good    Ms. Kristen Castillo's family history includes Arthritis in her mother; Cancer in her maternal grandfather; Colon cancer (age of onset: 50) in her mother; Dementia in her father; Diabetes in her sister; Heart disease in her father. There is no history of Stomach cancer.      Objective:    Filed Vitals:   10/18/14 0937  BP: 150/82  Pulse: 80    Physical Exam  well-developed older white female in no acute distress,  accompanied by her husband, and a friend blood pressure 150/82 pulse 80 height 5 foot 2 weight 134. HEENT; nontraumatic normocephalic EOMI PERRLA sclera anicteric, Supple no JVD, Cardiovascular; regular rate and rhythm with S1-S2 no murmur or gallop, Pulmonary clear bilaterally, Abdomen; soft bowel sounds are present she has some tenderness in the subxiphoid area and also mild tenderness around her colostomy in the left lower quadrant which is quite protuberant consistent with per- stomal hernia, brown semi-formed stool in ostomy bag, incisional scars healed, Rectal ;exam not done, Extremities ;no clubbing cyanosis or edema skin warm and dry, Neuropsych; mood and affect appropriate       Assessment & Plan:   #1 74 yo female with subxyphoid gnawing pain and burning in chest - most consistent with GERD #2 abnormal CXR 7/26- treated as pneumonia with right lung opacity- but pt had no cough,sputum ofr fever- scheduled for f/u CXR next week #3 Mid abdominal  pain ,intermittent  ,and increase  in size of area around temporary colostomy - probable hernia ? Intermittent transient incarceration of bowel #4 diverticulosis  Plan; Ct abd/pelvis with contrast  Start protonix 40 mg po daily qam Antireflux regimen  Follow up Cxr next week- if abnormal will need chest CT. Surgery planned with Dr Zella Richer  11/08/14   Amy S Esterwood PA-C 10/18/2014   Cc: Plotnikov, Evie Lacks, MD  Addendum: Reviewed and agree with management. Jerene Bears, MD

## 2014-10-20 ENCOUNTER — Ambulatory Visit (INDEPENDENT_AMBULATORY_CARE_PROVIDER_SITE_OTHER)
Admission: RE | Admit: 2014-10-20 | Discharge: 2014-10-20 | Disposition: A | Discharge status: Home / Self Care | Payer: Medicare Other | Source: Ambulatory Visit | Attending: Physician Assistant | Admitting: Physician Assistant

## 2014-10-20 DIAGNOSIS — Z933 Colostomy status: Secondary | ICD-10-CM | POA: Diagnosis not present

## 2014-10-20 DIAGNOSIS — R1011 Right upper quadrant pain: Secondary | ICD-10-CM | POA: Diagnosis not present

## 2014-10-20 DIAGNOSIS — R1012 Left upper quadrant pain: Secondary | ICD-10-CM

## 2014-10-20 DIAGNOSIS — R1013 Epigastric pain: Secondary | ICD-10-CM | POA: Diagnosis not present

## 2014-10-20 MED ORDER — IOHEXOL 300 MG/ML  SOLN
100.0000 mL | Freq: Once | INTRAMUSCULAR | Status: AC | PRN
  Administered 2014-10-20: 100 mL via INTRAVENOUS

## 2014-10-24 ENCOUNTER — Telehealth: Payer: Self-pay | Admitting: Physician Assistant

## 2014-10-25 ENCOUNTER — Ambulatory Visit (INDEPENDENT_AMBULATORY_CARE_PROVIDER_SITE_OTHER)
Admission: RE | Admit: 2014-10-25 | Discharge: 2014-10-25 | Disposition: A | Discharge status: Home / Self Care | Payer: Medicare Other | Source: Ambulatory Visit | Attending: Internal Medicine | Admitting: Internal Medicine

## 2014-10-25 DIAGNOSIS — J189 Pneumonia, unspecified organism: Secondary | ICD-10-CM | POA: Diagnosis not present

## 2014-10-25 NOTE — Telephone Encounter (Signed)
Patient advised.

## 2014-10-26 ENCOUNTER — Other Ambulatory Visit: Payer: Self-pay | Admitting: Gynecology

## 2014-10-28 LAB — CYTOLOGY - PAP

## 2014-11-03 ENCOUNTER — Encounter (HOSPITAL_COMMUNITY): Payer: Self-pay

## 2014-11-03 ENCOUNTER — Emergency Department (HOSPITAL_COMMUNITY)
Admission: EM | Admit: 2014-11-03 | Discharge: 2014-11-03 | Disposition: A | Discharge status: Home / Self Care | Payer: Medicare Other | Attending: Emergency Medicine | Admitting: Emergency Medicine

## 2014-11-03 ENCOUNTER — Encounter (HOSPITAL_COMMUNITY)
Admission: RE | Admit: 2014-11-03 | Discharge: 2014-11-03 | Disposition: A | Discharge status: Home / Self Care | Payer: Medicare Other | Source: Ambulatory Visit | Attending: General Surgery | Admitting: General Surgery

## 2014-11-03 DIAGNOSIS — Z8701 Personal history of pneumonia (recurrent): Secondary | ICD-10-CM | POA: Insufficient documentation

## 2014-11-03 DIAGNOSIS — Z79899 Other long term (current) drug therapy: Secondary | ICD-10-CM | POA: Diagnosis not present

## 2014-11-03 DIAGNOSIS — E559 Vitamin D deficiency, unspecified: Secondary | ICD-10-CM | POA: Insufficient documentation

## 2014-11-03 DIAGNOSIS — M1990 Unspecified osteoarthritis, unspecified site: Secondary | ICD-10-CM | POA: Insufficient documentation

## 2014-11-03 DIAGNOSIS — M810 Age-related osteoporosis without current pathological fracture: Secondary | ICD-10-CM | POA: Insufficient documentation

## 2014-11-03 DIAGNOSIS — Z01818 Encounter for other preprocedural examination: Secondary | ICD-10-CM | POA: Diagnosis not present

## 2014-11-03 DIAGNOSIS — Z792 Long term (current) use of antibiotics: Secondary | ICD-10-CM | POA: Insufficient documentation

## 2014-11-03 DIAGNOSIS — Z8679 Personal history of other diseases of the circulatory system: Secondary | ICD-10-CM | POA: Diagnosis not present

## 2014-11-03 DIAGNOSIS — Z872 Personal history of diseases of the skin and subcutaneous tissue: Secondary | ICD-10-CM | POA: Insufficient documentation

## 2014-11-03 DIAGNOSIS — M79605 Pain in left leg: Secondary | ICD-10-CM | POA: Diagnosis present

## 2014-11-03 DIAGNOSIS — Q6100 Congenital renal cyst, unspecified: Secondary | ICD-10-CM | POA: Insufficient documentation

## 2014-11-03 DIAGNOSIS — K219 Gastro-esophageal reflux disease without esophagitis: Secondary | ICD-10-CM | POA: Insufficient documentation

## 2014-11-03 DIAGNOSIS — Z933 Colostomy status: Secondary | ICD-10-CM | POA: Diagnosis not present

## 2014-11-03 HISTORY — DX: Adverse effect of unspecified anesthetic, initial encounter: T41.45XA

## 2014-11-03 HISTORY — DX: Other specified postprocedural states: R11.2

## 2014-11-03 HISTORY — DX: Other complications of anesthesia, initial encounter: T88.59XA

## 2014-11-03 HISTORY — DX: Cyst of kidney, acquired: N28.1

## 2014-11-03 HISTORY — DX: Gastro-esophageal reflux disease without esophagitis: K21.9

## 2014-11-03 HISTORY — DX: Pneumonia, unspecified organism: J18.9

## 2014-11-03 HISTORY — DX: Other specified postprocedural states: Z98.890

## 2014-11-03 HISTORY — DX: Diverticulitis of intestine, part unspecified, without perforation or abscess without bleeding: K57.92

## 2014-11-03 LAB — COMPREHENSIVE METABOLIC PANEL
ALBUMIN: 4 g/dL (ref 3.5–5.0)
ALK PHOS: 86 U/L (ref 38–126)
ALT: 13 U/L — ABNORMAL LOW (ref 14–54)
ANION GAP: 7 (ref 5–15)
AST: 20 U/L (ref 15–41)
BUN: 21 mg/dL — ABNORMAL HIGH (ref 6–20)
CALCIUM: 9.4 mg/dL (ref 8.9–10.3)
CO2: 27 mmol/L (ref 22–32)
Chloride: 106 mmol/L (ref 101–111)
Creatinine, Ser: 0.92 mg/dL (ref 0.44–1.00)
GFR calc non Af Amer: >60 mL/min (ref >60)
Glucose, Bld: 109 mg/dL — ABNORMAL HIGH (ref 65–99)
POTASSIUM: 4.7 mmol/L (ref 3.5–5.1)
SODIUM: 140 mmol/L (ref 135–145)
Total Bilirubin: 0.4 mg/dL (ref 0.3–1.2)
Total Protein: 7.6 g/dL (ref 6.5–8.1)

## 2014-11-03 LAB — CBC WITH DIFFERENTIAL/PLATELET
BASOS PCT: 1 % (ref 0–1)
Basophils Absolute: 0 10*3/uL (ref 0.0–0.1)
EOS ABS: 0.2 10*3/uL (ref 0.0–0.7)
EOS PCT: 2 % (ref 0–5)
HCT: 43.4 % (ref 36.0–46.0)
HEMOGLOBIN: 13.8 g/dL (ref 12.0–15.0)
LYMPHS ABS: 1.8 10*3/uL (ref 0.7–4.0)
Lymphocytes Relative: 23 % (ref 12–46)
MCH: 28.6 pg (ref 26.0–34.0)
MCHC: 31.8 g/dL (ref 30.0–36.0)
MCV: 90 fL (ref 78.0–100.0)
MONO ABS: 0.5 10*3/uL (ref 0.1–1.0)
MONOS PCT: 7 % (ref 3–12)
Neutro Abs: 5.2 10*3/uL (ref 1.7–7.7)
Neutrophils Relative %: 67 % (ref 43–77)
PLATELETS: 266 10*3/uL (ref 150–400)
RBC: 4.82 MIL/uL (ref 3.87–5.11)
RDW: 14.2 % (ref 11.5–15.5)
WBC: 7.7 10*3/uL (ref 4.0–10.5)

## 2014-11-03 LAB — PROTIME-INR
INR: 1.03 (ref 0.00–1.49)
Prothrombin Time: 13.7 seconds (ref 11.6–15.2)

## 2014-11-03 NOTE — ED Provider Notes (Signed)
CSN: [id number]     Arrival date & time 11/03/14  1038 History   First MD Initiated Contact with Patient 11/03/14 1053     Chief Complaint  Patient presents with  . Leg Pain     HPI Patient presents with a very small area of erythema of her left lower mid leg on the medial aspect.  His been present for 2 days.  It is slightly tender to touch.  There is been no spreading redness from this.  No injury or trauma.  No fevers or chills.  No leg swelling.  No history DVT or pulmonary embolism.  No numbness or tingling in her feet.  No other complaints.  She is scheduled for colostomy reversal next week and she thought she would have this looked at to make sure that this would not preclude her surgery.  She is no other complaints.   Past Medical History  Diagnosis Date  . Hematuria     in past  . Hair loss   . Unspecified vitamin D deficiency   . Migraines     in past  . Osteoporosis   . Osteoarthritis   . History of creation of ostomy     past partial colectomy in 05/03/14  . Complication of anesthesia   . PONV (postoperative nausea and vomiting)   . Pneumonia     july 2016  . GERD (gastroesophageal reflux disease)   . Diverticulitis   . Kidney cysts    Past Surgical History  Procedure Laterality Date  . Abdominal hysterectomy  1972  . Oophorectomy  1976  . Tonsillectomy    . Lipoma resection      left scapula area  . Reduction mammaplasty  1994  . Rhinoplasty  1985  . Facial cosmetic surgery  2009    [name] MD in [place]  . Laparoscopic partial colectomy N/A 05/03/2014    Procedure: DIAGNOSTIC LAPAROSCOPY WITH DRAINAGE OF INTRA ABDOMINAL ABSCESS PARTIAL COLECTOMY ;  Surgeon: [name], MD;  Location: [facility];  Service: General;  Laterality: N/A;  supine position  . Laparotomy N/A 05/03/2014    Procedure: EXPLORATORY LAPAROTOMY WITH HARTMAN PROCEDURE;  Surgeon: [name], MD;  Location: [facility];  Service: General;  Laterality: N/A;   Family History  Problem  Relation Age of Onset  . Arthritis Mother   . Colon cancer Mother 28  . Heart disease Father   . Dementia Father   . Diabetes Sister   . Cancer Maternal Grandfather     Lung cancer  . Stomach cancer Neg Hx    Social History  Substance Use Topics  . Smoking status: Never Smoker   . Smokeless tobacco: Never Used  . Alcohol Use: No   OB History    No data available     Review of Systems  All other systems reviewed and are negative.     Allergies  Codeine and Sulfonamide derivatives  Home Medications   Prior to Admission medications   Medication Sig Start Date End Date Taking? Authorizing Provider  ALPRAZolam (XANAX) 0.25 MG tablet Take 1 tablet (0.25 mg total) by mouth 2 (two) times daily as needed for anxiety. Patient not taking: Reported on 10/26/2014 10/04/14   Lew Dawes V, MD  calcium carbonate (OS-CAL) 600 MG TABS tablet Take 600 mg by mouth 2 (two) times daily with a meal.    Historical Provider, MD  Cholecalciferol 1000 UNITS tablet Take 2,000 Units by mouth 2 (two) times daily.  Historical Provider, MD  docusate sodium (COLACE) 100 MG capsule Take 300 mg by mouth 2 (two) times daily.     Historical Provider, MD  HYDROcodone-acetaminophen (NORCO/VICODIN) 5-325 MG per tablet Take 0.5-1 tablets by mouth 2 (two) times daily as needed for moderate pain or severe pain. 10/05/14   Aleksei Plotnikov V, MD  Iron Polysacch Cmplx-B12-FA 150-0.025-1 MG CAPS Take 1 capsule by mouth daily. Patient not taking: Reported on 10/26/2014 05/26/14   Aleksei Plotnikov V, MD  loratadine (CLARITIN) 10 MG tablet Take 1 tablet (10 mg total) by mouth daily. Patient not taking: Reported on 10/26/2014 08/04/14   Cassandria Anger, MD  metroNIDAZOLE (FLAGYL) 500 MG tablet Take 1,000 mg by mouth 3 (three) times daily.    Historical Provider, MD  Multiple Vitamin (MULTIVITAMIN) tablet Take 1 tablet by mouth daily.    Historical Provider, MD  neomycin (MYCIFRADIN) 500 MG tablet Take 1,000 mg  by mouth 3 (three) times daily.    Historical Provider, MD  ondansetron (ZOFRAN ODT) 4 MG disintegrating tablet Take 1 tablet (4 mg total) by mouth every 8 (eight) hours as needed for nausea or vomiting. Patient not taking: Reported on 10/26/2014 05/11/14   Nat Christen, PA-C  pantoprazole (PROTONIX) 40 MG tablet Take 1 tablet (40 mg total) by mouth daily. 10/18/14   Amy S Esterwood, PA-C  ranitidine (ZANTAC) 150 MG tablet Take 1 tablet (150 mg total) by mouth 2 (two) times daily. Patient not taking: Reported on 10/26/2014 08/04/14   Lew Dawes V, MD   There were no vitals taken for this visit. Physical Exam  Constitutional: She is oriented to person, place, and time. She appears well-developed and well-nourished.  HENT:  Head: Normocephalic.  Eyes: EOM are normal.  Neck: Normal range of motion.  Pulmonary/Chest: Effort normal.  Abdominal: She exhibits no distension.  Musculoskeletal: Normal range of motion.  Mild 3 cm area of erythema to the mid left lower leg on the medial aspect.  There is no fluctuance or drainage.  No significant induration.  No significant warmth.  Mild tenderness to palpation.  Normal PT and DP pulse in the left foot.  No unilateral leg swelling of the left as compared to the right.  Full range of motion in major joints.  Neurological: She is alert and oriented to person, place, and time.  Psychiatric: She has a normal mood and affect.  Nursing note and vitals reviewed.   ED Course  Procedures (including critical care time) Labs Review Labs Reviewed - No data to display  Imaging Review No results found. I have personally reviewed and evaluated these images and lab results as part of my medical decision-making.   EKG Interpretation None      MDM   Final diagnoses:  Left leg pain    I suspect this is a case of phlebitis.  Doubt DVT.  No significant signs to suggest cellulitis.  Family took a picture of this on their smart phone and we outlined this  with a skin marker.  Patient will keep a very close eye on this to the weekend.  Past that she return to the emergency department for any new or worsening symptoms including spreading erythema or worsening pain.  No indication for imaging or lab testing today.    Jola Schmidt, MD 11/03/14 [phone]

## 2014-11-03 NOTE — ED Notes (Signed)
Pt c/o LLE pain and redness x "a couple days."  Pain score 6/10 w/ palpation only.  Slight redness noted to the area.  No swelling or warmth noted.  Pt reports that pain was more severe last night.  Pt is concerned because she is supposed to have surgery next Tuesday to reverse a colostomy.

## 2014-11-03 NOTE — Patient Instructions (Addendum)
YOUR PROCEDURE IS SCHEDULED ON : 11/08/14  REPORT TO [facility] MAIN ENTRANCE FOLLOW SIGNS TO EAST ELEVATOR - GO TO 3rd FLOOR CHECK IN AT 3 EAST NURSES STATION (SHORT STAY) AT: 5:30 am  CALL THIS NUMBER IF YOU HAVE PROBLEMS THE MORNING OF SURGERY [phone]  REMEMBER:ONLY 1 PER PERSON MAY GO TO SHORT STAY WITH YOU TO GET READY THE MORNING OF YOUR SURGERY  DO NOT EAT FOOD OR DRINK LIQUIDS AFTER MIDNIGHT  TAKE THESE MEDICINES THE MORNING OF SURGERY: PANTOPRAZOLE   STOP ASPIRIN / IBUPROFEN / ALEVE / VITAMINS / HERBAL MEDS __5__ DAYS BEFORE SURGERY  FOLLOW BOWEL PREP FROM OFFICE  YOU MAY NOT HAVE ANY METAL ON YOUR BODY INCLUDING HAIR PINS AND PIERCING'S. DO NOT WEAR JEWELRY, MAKEUP, LOTIONS, POWDERS OR PERFUMES. DO NOT WEAR NAIL POLISH. DO NOT SHAVE 48 HRS PRIOR TO SURGERY. MEN MAY SHAVE FACE AND NECK.  DO NOT [place]. [facility] IS NOT RESPONSIBLE FOR VALUABLES.  CONTACTS, DENTURES OR PARTIALS MAY NOT BE WORN TO SURGERY. LEAVE SUITCASE IN CAR. CAN BE BROUGHT TO ROOM AFTER SURGERY.  PATIENTS DISCHARGED THE DAY OF SURGERY WILL NOT BE ALLOWED TO DRIVE HOME.  PLEASE READ OVER THE FOLLOWING INSTRUCTION SHEETS _________________________________________________________________________________                                           - PREPARING FOR SURGERY  Before surgery, you can play an important role.  Because skin is not sterile, your skin needs to be as free of germs as possible.  You can reduce the number of germs on your skin by washing with CHG (chlorahexidine gluconate) soap before surgery.  CHG is an antiseptic cleaner which kills germs and bonds with the skin to continue killing germs even after washing. Please DO NOT use if you have an allergy to CHG or antibacterial soaps.  If your skin becomes reddened/irritated stop using the CHG and inform your nurse when you arrive at Short Stay. Do not shave (including legs and  underarms) for at least 48 hours prior to the first CHG shower.  You may shave your face. Please follow these instructions carefully:   1.  Shower with CHG Soap the night before surgery and the  morning of Surgery.   2.  If you choose to wash your hair, wash your hair first as usual with your  normal  Shampoo.   3.  After you shampoo, rinse your hair and body thoroughly to remove the  shampoo.                                         4.  Use CHG as you would any other liquid soap.  You can apply chg directly  to the skin and wash . Gently wash with scrungie or clean wascloth    5.  Apply the CHG Soap to your body ONLY FROM THE NECK DOWN.   Do not use on open                           Wound or open sores. Avoid contact with eyes, ears mouth and genitals (private parts).  Genitals (private parts) with your normal soap.              6.  Wash thoroughly, paying special attention to the area where your surgery  will be performed.   7.  Thoroughly rinse your body with warm water from the neck down.   8.  DO NOT shower/wash with your normal soap after using and rinsing off  the CHG Soap .                9.  Pat yourself dry with a clean towel.             10.  Wear clean night clothes to bed after shower             11.  Place clean sheets on your bed the night of your first shower and do not  sleep with pets.  Day of Surgery : Do not apply any lotions/deodorants the morning of surgery.  Please wear clean clothes to the hospital/surgery center.  FAILURE TO FOLLOW THESE INSTRUCTIONS MAY RESULT IN THE CANCELLATION OF YOUR SURGERY    PATIENT SIGNATURE_________________________________  ______________________________________________________________________     Kristen Castillo  An incentive spirometer is a tool that can help keep your lungs clear and active. This tool measures how well you are filling your lungs with each breath. Taking long deep breaths may  help reverse or decrease the chance of developing breathing (pulmonary) problems (especially infection) following:  A long period of time when you are unable to move or be active. BEFORE THE PROCEDURE   If the spirometer includes an indicator to show your best effort, your nurse or respiratory therapist will set it to a desired goal.  If possible, sit up straight or lean slightly forward. Try not to slouch.  Hold the incentive spirometer in an upright position. INSTRUCTIONS FOR USE  1. Sit on the edge of your bed if possible, or sit up as far as you can in bed or on a chair. 2. Hold the incentive spirometer in an upright position. 3. Breathe out normally. 4. Place the mouthpiece in your mouth and seal your lips tightly around it. 5. Breathe in slowly and as deeply as possible, raising the piston or the ball toward the top of the column. 6. Hold your breath for 3-5 seconds or for as long as possible. Allow the piston or ball to fall to the bottom of the column. 7. Remove the mouthpiece from your mouth and breathe out normally. 8. Rest for a few seconds and repeat Steps 1 through 7 at least 10 times every 1-2 hours when you are awake. Take your time and take a few normal breaths between deep breaths. 9. The spirometer may include an indicator to show your best effort. Use the indicator as a goal to work toward during each repetition. 10. After each set of 10 deep breaths, practice coughing to be sure your lungs are clear. If you have an incision (the cut made at the time of surgery), support your incision when coughing by placing a pillow or rolled up towels firmly against it. Once you are able to get out of bed, walk around indoors and cough well. You may stop using the incentive spirometer when instructed by your caregiver.  RISKS AND COMPLICATIONS  Take your time so you do not get dizzy or light-headed.  If you are in pain, you may need to take or ask for pain medication before doing  incentive spirometry. It  is harder to take a deep breath if you are having pain. AFTER USE  Rest and breathe slowly and easily.  It can be helpful to keep track of a log of your progress. Your caregiver can provide you with a simple table to help with this. If you are using the spirometer at home, follow these instructions: [place] IF:   You are having difficultly using the spirometer.  You have trouble using the spirometer as often as instructed.  Your pain medication is not giving enough relief while using the spirometer.  You develop fever of 100.5 F (38.1 C) or higher. SEEK IMMEDIATE MEDICAL CARE IF:   You cough up bloody sputum that had not been present before.  You develop fever of 102 F (38.9 C) or greater.  You develop worsening pain at or near the incision site. MAKE SURE YOU:   Understand these instructions.  Will watch your condition.  Will get help right away if you are not doing well or get worse. Document Released: 07/08/2006 Document Revised: 05/20/2011 Document Reviewed: 09/08/2006 [facility] Patient Information 2014 [place], [place].   ________________________________________________________________________

## 2014-11-03 NOTE — ED Notes (Signed)
Questions, concerns denied r/t dc. Pt is ambulatory and a&ox4

## 2014-11-03 NOTE — Discharge Instructions (Signed)
Phlebitis Phlebitis is soreness and swelling (inflammation) of a vein. This can occur in your arms, legs, or torso (trunk), as well as deeper inside your body. Phlebitis is usually not serious when it occurs close to the surface of the body. However, it can cause serious problems when it occurs in a vein deeper inside the body. CAUSES  Phlebitis can be triggered by various things, including:   Reduced blood flow through your veins. This can happen with:  Bed rest over a long period.  Long-distance travel.  Injury.  Surgery.  Being overweight (obese) or pregnant.  Having an IV tube put in the vein and getting certain medicines through the vein.  Cancer and cancer treatment.  Use of illegal drugs taken through the vein.  Inflammatory diseases.  Inherited (genetic) diseases that increase the risk of blood clots.  Hormone therapy, such as birth control pills. SIGNS AND SYMPTOMS   Red, tender, swollen, and painful area on your skin. Usually, the area will be long and narrow.  Firmness along the center of the affected area. This can indicate that a blood clot has formed.  Low-grade fever. DIAGNOSIS  A health care provider can usually diagnose phlebitis by examining the affected area and asking about your symptoms. To check for infection or blood clots, your health care provider may order blood tests or an ultrasound exam of the area. Blood tests and your family history may also indicate if you have an underlying genetic disease that causes blood clots. Occasionally, a piece of tissue is taken from the body (biopsy sample) if an unusual cause of phlebitis is suspected. TREATMENT  Treatment will vary depending on the severity of the condition and the area of the body affected. Treatment may include:  Use of a warm compress or heating pad.  Use of compression stockings or bandages.  Anti-inflammatory medicines.  Removal of any IV tube that may be causing the problem.  Medicines  that kill germs (antibiotics) if an infection is present.  Blood-thinning medicines if a blood clot is suspected or present.  In rare cases, surgery may be needed to remove damaged sections of vein. HOME CARE INSTRUCTIONS   Only take over-the-counter or prescription medicines as directed by your health care provider. Take all medicines exactly as prescribed.  Raise (elevate) the affected area above the level of your heart as directed by your health care provider.  Apply a warm compress or heating pad to the affected area as directed by your health care provider. Do not sleep with the heating pad.  Use compression stockings or bandages as directed. These will speed healing and prevent the condition from coming back.  If you are on blood thinners:  Get follow-up blood tests as directed by your health care provider.  Check with your health care provider before using any new medicines.  Carry a medical alert card or wear your medical alert jewelry to show that you are on blood thinners.  For phlebitis in the legs:  Avoid prolonged standing or bed rest.  Keep your legs moving. Raise your legs when sitting or lying.  Do not smoke.  Women, particularly those over the age of 86, should consider the risks and benefits of taking the contraceptive pill. This kind of hormone treatment can increase your risk for blood clots.  Follow up with your health care provider as directed. SEEK MEDICAL CARE IF:   You have unusual bruising or any bleeding problems.  Your swelling or pain in the affected area  is not improving.  You are on anti-inflammatory medicine, and you develop belly (abdominal) pain. SEEK IMMEDIATE MEDICAL CARE IF:   You have a sudden onset of chest pain or difficulty breathing.  You have a fever or persistent symptoms for more than 2-3 days.  You have a fever and your symptoms suddenly get worse. MAKE SURE YOU:  Understand these instructions.  Will watch your  condition.  Will get help right away if you are not doing well or get worse. Document Released: 02/19/2001 Document Revised: 12/16/2012 Document Reviewed: 11/02/2012 [facility] Patient Information 2015 [place], [place]. This information is not intended to replace advice given to you by your health care provider. Make sure you discuss any questions you have with your health care provider.

## 2014-11-04 LAB — HEMOGLOBIN A1C
Hgb A1c MFr Bld: 6 % — ABNORMAL HIGH (ref 4.8–5.6)
MEAN PLASMA GLUCOSE: 126 mg/dL

## 2014-11-08 ENCOUNTER — Encounter (HOSPITAL_COMMUNITY): Payer: Self-pay | Admitting: *Deleted

## 2014-11-08 ENCOUNTER — Inpatient Hospital Stay (HOSPITAL_COMMUNITY)
Admission: RE | Admit: 2014-11-08 | Discharge: 2014-11-15 | DRG: 330 | Disposition: A | Discharge status: Home Health | Payer: Medicare Other | Source: Ambulatory Visit | Attending: General Surgery | Admitting: General Surgery

## 2014-11-08 ENCOUNTER — Inpatient Hospital Stay (HOSPITAL_COMMUNITY): Payer: Medicare Other | Admitting: Certified Registered Nurse Anesthetist

## 2014-11-08 ENCOUNTER — Encounter (HOSPITAL_COMMUNITY)
Admission: RE | Disposition: A | Discharge status: Home Health | Payer: Self-pay | Source: Ambulatory Visit | Attending: General Surgery

## 2014-11-08 DIAGNOSIS — Z9049 Acquired absence of other specified parts of digestive tract: Secondary | ICD-10-CM | POA: Diagnosis present

## 2014-11-08 DIAGNOSIS — Z01812 Encounter for preprocedural laboratory examination: Secondary | ICD-10-CM | POA: Diagnosis not present

## 2014-11-08 DIAGNOSIS — Z933 Colostomy status: Secondary | ICD-10-CM

## 2014-11-08 DIAGNOSIS — Z433 Encounter for attention to colostomy: Principal | ICD-10-CM

## 2014-11-08 DIAGNOSIS — K5790 Diverticulosis of intestine, part unspecified, without perforation or abscess without bleeding: Secondary | ICD-10-CM | POA: Diagnosis present

## 2014-11-08 DIAGNOSIS — K913 Postprocedural intestinal obstruction: Secondary | ICD-10-CM | POA: Diagnosis not present

## 2014-11-08 DIAGNOSIS — Z79899 Other long term (current) drug therapy: Secondary | ICD-10-CM

## 2014-11-08 DIAGNOSIS — K435 Parastomal hernia without obstruction or  gangrene: Secondary | ICD-10-CM | POA: Diagnosis present

## 2014-11-08 DIAGNOSIS — T402X5A Adverse effect of other opioids, initial encounter: Secondary | ICD-10-CM | POA: Diagnosis not present

## 2014-11-08 DIAGNOSIS — K660 Peritoneal adhesions (postprocedural) (postinfection): Secondary | ICD-10-CM | POA: Diagnosis present

## 2014-11-08 DIAGNOSIS — R110 Nausea: Secondary | ICD-10-CM | POA: Diagnosis not present

## 2014-11-08 DIAGNOSIS — Z980 Intestinal bypass and anastomosis status: Secondary | ICD-10-CM

## 2014-11-08 HISTORY — PX: ILEO LOOP COLOSTOMY CLOSURE: SHX5257

## 2014-11-08 HISTORY — PX: LAPAROSCOPIC LYSIS OF ADHESIONS: SHX5905

## 2014-11-08 LAB — TYPE AND SCREEN
ABO/RH(D): A POS
ANTIBODY SCREEN: NEGATIVE

## 2014-11-08 SURGERY — CLOSURE, ILEOSTOMY, LAPAROSCOPIC, WITH LAPAROTOMY IF INDICATED
Anesthesia: General | Site: Abdomen

## 2014-11-08 MED ORDER — FENTANYL CITRATE (PF) 100 MCG/2ML IJ SOLN
INTRAMUSCULAR | Status: DC | PRN
Start: 2014-11-08 — End: 2014-11-08
  Administered 2014-11-08 (×7): 50 ug via INTRAVENOUS

## 2014-11-08 MED ORDER — OXYCODONE HCL 5 MG/5ML PO SOLN
5.0000 mg | Freq: Once | ORAL | Status: DC | PRN
  Filled 2014-11-08: qty 5

## 2014-11-08 MED ORDER — HYDROMORPHONE HCL 1 MG/ML IJ SOLN
0.2500 mg | INTRAMUSCULAR | Status: DC | PRN
  Administered 2014-11-08 (×2): 0.5 mg via INTRAVENOUS

## 2014-11-08 MED ORDER — LACTATED RINGERS IV SOLN
INTRAVENOUS | Status: DC | PRN
  Administered 2014-11-08 (×3): via INTRAVENOUS

## 2014-11-08 MED ORDER — NALOXONE HCL 0.4 MG/ML IJ SOLN: 0.4000 mg | INTRAMUSCULAR | Status: DC | PRN

## 2014-11-08 MED ORDER — SODIUM CHLORIDE 0.9 % IJ SOLN
INTRAMUSCULAR | Status: AC
  Filled 2014-11-08: qty 10

## 2014-11-08 MED ORDER — DEXAMETHASONE SODIUM PHOSPHATE 10 MG/ML IJ SOLN
INTRAMUSCULAR | Status: DC | PRN
  Administered 2014-11-08: 10 mg via INTRAVENOUS

## 2014-11-08 MED ORDER — ROCURONIUM BROMIDE 100 MG/10ML IV SOLN
INTRAVENOUS | Status: AC
  Filled 2014-11-08: qty 1

## 2014-11-08 MED ORDER — DEXTROSE 5 % IV SOLN
2.0000 g | INTRAVENOUS | Status: AC
  Administered 2014-11-08: 2 g via INTRAVENOUS

## 2014-11-08 MED ORDER — GLYCOPYRROLATE 0.2 MG/ML IJ SOLN
INTRAMUSCULAR | Status: AC
  Filled 2014-11-08: qty 3

## 2014-11-08 MED ORDER — DIPHENHYDRAMINE HCL 50 MG/ML IJ SOLN
12.5000 mg | Freq: Four times a day (QID) | INTRAMUSCULAR | Status: DC | PRN
  Administered 2014-11-09 – 2014-11-14 (×6): 12.5 mg via INTRAVENOUS
  Filled 2014-11-08 (×6): qty 1

## 2014-11-08 MED ORDER — CHLORHEXIDINE GLUCONATE CLOTH 2 % EX PADS: 6.0000 | MEDICATED_PAD | Freq: Once | CUTANEOUS | Status: DC

## 2014-11-08 MED ORDER — ONDANSETRON HCL 4 MG/2ML IJ SOLN
INTRAMUSCULAR | Status: DC | PRN
  Administered 2014-11-08: 4 mg via INTRAVENOUS

## 2014-11-08 MED ORDER — PROMETHAZINE HCL 25 MG/ML IJ SOLN
6.2500 mg | INTRAMUSCULAR | Status: DC | PRN
Start: 2014-11-08 — End: 2014-11-08
  Administered 2014-11-08: 6.25 mg via INTRAVENOUS

## 2014-11-08 MED ORDER — BUPIVACAINE HCL (PF) 0.5 % IJ SOLN
INTRAMUSCULAR | Status: AC
  Filled 2014-11-08: qty 30

## 2014-11-08 MED ORDER — LIDOCAINE HCL (CARDIAC) 20 MG/ML IV SOLN
INTRAVENOUS | Status: AC
  Filled 2014-11-08: qty 5

## 2014-11-08 MED ORDER — FENTANYL CITRATE (PF) 100 MCG/2ML IJ SOLN
INTRAMUSCULAR | Status: AC
Start: 2014-11-08 — End: 2014-11-08
  Filled 2014-11-08: qty 4

## 2014-11-08 MED ORDER — DIPHENHYDRAMINE HCL 12.5 MG/5ML PO ELIX
12.5000 mg | ORAL_SOLUTION | Freq: Four times a day (QID) | ORAL | Status: DC | PRN
  Filled 2014-11-08 (×2): qty 5

## 2014-11-08 MED ORDER — SUCCINYLCHOLINE CHLORIDE 20 MG/ML IJ SOLN
INTRAMUSCULAR | Status: DC | PRN
  Administered 2014-11-08: 100 mg via INTRAVENOUS

## 2014-11-08 MED ORDER — HYDROMORPHONE HCL 1 MG/ML IJ SOLN
INTRAMUSCULAR | Status: AC
  Filled 2014-11-08: qty 1

## 2014-11-08 MED ORDER — KCL IN DEXTROSE-NACL 20-5-0.9 MEQ/L-%-% IV SOLN
INTRAVENOUS | Status: DC
Start: 2014-11-08 — End: 2014-11-14
  Administered 2014-11-08: 13:00:00 via INTRAVENOUS
  Administered 2014-11-08: 125 mL/h via INTRAVENOUS
  Administered 2014-11-09: 100 mL/h via INTRAVENOUS
  Administered 2014-11-09: 125 mL/h via INTRAVENOUS
  Administered 2014-11-09 – 2014-11-14 (×9): via INTRAVENOUS
  Filled 2014-11-08 (×16): qty 1000

## 2014-11-08 MED ORDER — MORPHINE SULFATE 1 MG/ML IV SOLN
INTRAVENOUS | Status: DC
  Administered 2014-11-08: 8 mg via INTRAVENOUS
  Administered 2014-11-08: 19:00:00 via INTRAVENOUS
  Administered 2014-11-08: 3 mg via INTRAVENOUS
  Administered 2014-11-09: 07:00:00 via INTRAVENOUS
  Administered 2014-11-09: 5 mg via INTRAVENOUS
  Administered 2014-11-09: 1 mg via INTRAVENOUS
  Administered 2014-11-09: 4 mg via INTRAVENOUS
  Administered 2014-11-09: 9 mL via INTRAVENOUS
  Administered 2014-11-09: 2 mg via INTRAVENOUS
  Administered 2014-11-09: 2.85 mg via INTRAVENOUS
  Administered 2014-11-10: 18.7 mL via INTRAVENOUS
  Administered 2014-11-10: 5.18 mg via INTRAVENOUS
  Administered 2014-11-10: 1 mg via INTRAVENOUS
  Administered 2014-11-10: 3 mg via INTRAVENOUS
  Administered 2014-11-10: 2 mg via INTRAVENOUS
  Administered 2014-11-11: 3 mg via INTRAVENOUS
  Administered 2014-11-11: 6 mg via INTRAVENOUS
  Administered 2014-11-11: 04:00:00 via INTRAVENOUS
  Administered 2014-11-11 (×2): 4 mg via INTRAVENOUS
  Administered 2014-11-11: 10 mL via INTRAVENOUS
  Administered 2014-11-11: 22:00:00 via INTRAVENOUS
  Administered 2014-11-12: 3 mg via INTRAVENOUS
  Administered 2014-11-12 (×2): 2 mg via INTRAVENOUS
  Administered 2014-11-12: 1 mg via INTRAVENOUS
  Administered 2014-11-12: 7.5 mg via INTRAVENOUS
  Administered 2014-11-12: 1 mg via INTRAVENOUS
  Administered 2014-11-13: 09:00:00 via INTRAVENOUS
  Administered 2014-11-13 (×2): 2 mg via INTRAVENOUS
  Administered 2014-11-13: 3 mg via INTRAVENOUS
  Administered 2014-11-13: 2 mg via INTRAVENOUS
  Administered 2014-11-14: 0 mg via INTRAVENOUS
  Filled 2014-11-08 (×7): qty 25

## 2014-11-08 MED ORDER — BUPIVACAINE HCL (PF) 0.5 % IJ SOLN
INTRAMUSCULAR | Status: DC | PRN
  Administered 2014-11-08: 3 mL

## 2014-11-08 MED ORDER — PROPOFOL 10 MG/ML IV BOLUS
INTRAVENOUS | Status: DC | PRN
  Administered 2014-11-08: 50 mg via INTRAVENOUS
  Administered 2014-11-08: 150 mg via INTRAVENOUS
  Administered 2014-11-08: 40 mg via INTRAVENOUS

## 2014-11-08 MED ORDER — MIDAZOLAM HCL 2 MG/2ML IJ SOLN
INTRAMUSCULAR | Status: AC
  Filled 2014-11-08: qty 4

## 2014-11-08 MED ORDER — SODIUM CHLORIDE 0.9 % IJ SOLN: 9.0000 mL | INTRAMUSCULAR | Status: DC | PRN

## 2014-11-08 MED ORDER — DEXTROSE 5 % IV SOLN
2.0000 g | Freq: Two times a day (BID) | INTRAVENOUS | Status: AC
  Administered 2014-11-08: 2 g via INTRAVENOUS
  Filled 2014-11-08 (×2): qty 2

## 2014-11-08 MED ORDER — KCL IN DEXTROSE-NACL 20-5-0.9 MEQ/L-%-% IV SOLN
INTRAVENOUS | Status: AC
  Filled 2014-11-08: qty 1000

## 2014-11-08 MED ORDER — 0.9 % SODIUM CHLORIDE (POUR BTL) OPTIME
TOPICAL | Status: DC | PRN
  Administered 2014-11-08: 2000 mL

## 2014-11-08 MED ORDER — PROPOFOL 10 MG/ML IV BOLUS
INTRAVENOUS | Status: AC
  Filled 2014-11-08: qty 20

## 2014-11-08 MED ORDER — ALVIMOPAN 12 MG PO CAPS
12.0000 mg | ORAL_CAPSULE | Freq: Once | ORAL | Status: AC
  Administered 2014-11-08: 12 mg via ORAL
  Filled 2014-11-08: qty 1

## 2014-11-08 MED ORDER — EPHEDRINE SULFATE 50 MG/ML IJ SOLN
INTRAMUSCULAR | Status: DC | PRN
  Administered 2014-11-08 (×2): 10 mg via INTRAVENOUS

## 2014-11-08 MED ORDER — ONDANSETRON HCL 4 MG PO TABS: 4.0000 mg | ORAL_TABLET | Freq: Four times a day (QID) | ORAL | Status: DC | PRN

## 2014-11-08 MED ORDER — CEFOTETAN DISODIUM-DEXTROSE 2-2.08 GM-% IV SOLR
INTRAVENOUS | Status: AC
  Filled 2014-11-08: qty 50

## 2014-11-08 MED ORDER — INFLUENZA VAC SPLIT QUAD 0.5 ML IM SUSY
0.5000 mL | PREFILLED_SYRINGE | INTRAMUSCULAR | Status: DC
  Filled 2014-11-08 (×2): qty 0.5

## 2014-11-08 MED ORDER — GLYCOPYRROLATE 0.2 MG/ML IJ SOLN
INTRAMUSCULAR | Status: DC | PRN
  Administered 2014-11-08: 0.4 mg via INTRAVENOUS
  Administered 2014-11-08: 0.2 mg via INTRAVENOUS

## 2014-11-08 MED ORDER — MIDAZOLAM HCL 5 MG/5ML IJ SOLN
INTRAMUSCULAR | Status: DC | PRN
  Administered 2014-11-08: 1 mg via INTRAVENOUS

## 2014-11-08 MED ORDER — LIDOCAINE HCL (CARDIAC) 20 MG/ML IV SOLN
INTRAVENOUS | Status: DC | PRN
  Administered 2014-11-08: 70 mg via INTRAVENOUS

## 2014-11-08 MED ORDER — HYDROMORPHONE 0.3 MG/ML IV SOLN
INTRAVENOUS | Status: AC
  Filled 2014-11-08: qty 25

## 2014-11-08 MED ORDER — ONDANSETRON HCL 4 MG/2ML IJ SOLN: 4.0000 mg | Freq: Four times a day (QID) | INTRAMUSCULAR | Status: DC | PRN

## 2014-11-08 MED ORDER — PANTOPRAZOLE SODIUM 40 MG IV SOLR
40.0000 mg | Freq: Every day | INTRAVENOUS | Status: DC
  Administered 2014-11-08 – 2014-11-14 (×7): 40 mg via INTRAVENOUS
  Filled 2014-11-08 (×8): qty 40

## 2014-11-08 MED ORDER — ONDANSETRON HCL 4 MG/2ML IJ SOLN
4.0000 mg | Freq: Four times a day (QID) | INTRAMUSCULAR | Status: DC | PRN
  Administered 2014-11-08 (×2): 4 mg via INTRAVENOUS
  Filled 2014-11-08 (×2): qty 2

## 2014-11-08 MED ORDER — CETYLPYRIDINIUM CHLORIDE 0.05 % MT LIQD
7.0000 mL | Freq: Two times a day (BID) | OROMUCOSAL | Status: DC
  Administered 2014-11-08 – 2014-11-13 (×7): 7 mL via OROMUCOSAL

## 2014-11-08 MED ORDER — NEOSTIGMINE METHYLSULFATE 10 MG/10ML IV SOLN
INTRAVENOUS | Status: AC
  Filled 2014-11-08: qty 1

## 2014-11-08 MED ORDER — ONDANSETRON HCL 4 MG/2ML IJ SOLN
INTRAMUSCULAR | Status: AC
  Filled 2014-11-08: qty 2

## 2014-11-08 MED ORDER — NEOSTIGMINE METHYLSULFATE 10 MG/10ML IV SOLN
INTRAVENOUS | Status: DC | PRN
  Administered 2014-11-08: 3 mg via INTRAVENOUS

## 2014-11-08 MED ORDER — CHLORHEXIDINE GLUCONATE 0.12 % MT SOLN
15.0000 mL | Freq: Two times a day (BID) | OROMUCOSAL | Status: DC
  Administered 2014-11-08 – 2014-11-14 (×13): 15 mL via OROMUCOSAL
  Filled 2014-11-08 (×15): qty 15

## 2014-11-08 MED ORDER — DEXAMETHASONE SODIUM PHOSPHATE 10 MG/ML IJ SOLN
INTRAMUSCULAR | Status: AC
  Filled 2014-11-08: qty 1

## 2014-11-08 MED ORDER — PHENYLEPHRINE 40 MCG/ML (10ML) SYRINGE FOR IV PUSH (FOR BLOOD PRESSURE SUPPORT)
PREFILLED_SYRINGE | INTRAVENOUS | Status: AC
  Filled 2014-11-08: qty 10

## 2014-11-08 MED ORDER — LACTATED RINGERS IR SOLN
Status: DC | PRN
  Administered 2014-11-08: 1000 mL

## 2014-11-08 MED ORDER — FENTANYL CITRATE (PF) 250 MCG/5ML IJ SOLN
INTRAMUSCULAR | Status: AC
  Filled 2014-11-08: qty 25

## 2014-11-08 MED ORDER — PROMETHAZINE HCL 25 MG/ML IJ SOLN
INTRAMUSCULAR | Status: AC
  Filled 2014-11-08: qty 1

## 2014-11-08 MED ORDER — HYDROMORPHONE 0.3 MG/ML IV SOLN
INTRAVENOUS | Status: DC
  Administered 2014-11-08: 13:00:00 via INTRAVENOUS
  Administered 2014-11-08 (×2): 0.999 mg via INTRAVENOUS

## 2014-11-08 MED ORDER — OXYCODONE HCL 5 MG PO TABS: 5.0000 mg | ORAL_TABLET | Freq: Once | ORAL | Status: DC | PRN

## 2014-11-08 MED ORDER — ENOXAPARIN SODIUM 40 MG/0.4ML ~~LOC~~ SOLN
40.0000 mg | SUBCUTANEOUS | Status: DC
  Administered 2014-11-09 – 2014-11-15 (×7): 40 mg via SUBCUTANEOUS
  Filled 2014-11-08 (×7): qty 0.4

## 2014-11-08 MED ORDER — PHENYLEPHRINE HCL 10 MG/ML IJ SOLN
INTRAMUSCULAR | Status: DC | PRN
  Administered 2014-11-08: 40 ug via INTRAVENOUS
  Administered 2014-11-08 (×3): 80 ug via INTRAVENOUS

## 2014-11-08 MED ORDER — EPHEDRINE SULFATE 50 MG/ML IJ SOLN
INTRAMUSCULAR | Status: AC
  Filled 2014-11-08: qty 1

## 2014-11-08 MED ORDER — ROCURONIUM BROMIDE 100 MG/10ML IV SOLN
INTRAVENOUS | Status: DC | PRN
  Administered 2014-11-08: 10 mg via INTRAVENOUS
  Administered 2014-11-08 (×3): 5 mg via INTRAVENOUS
  Administered 2014-11-08: 10 mg via INTRAVENOUS
  Administered 2014-11-08: 40 mg via INTRAVENOUS
  Administered 2014-11-08 (×2): 5 mg via INTRAVENOUS
  Administered 2014-11-08: 10 mg via INTRAVENOUS

## 2014-11-08 SURGICAL SUPPLY — 67 items
APPLIER CLIP 5 13 M/L LIGAMAX5 (MISCELLANEOUS)
APPLIER CLIP ROT 10 11.4 M/L (STAPLE)
BLADE EXTENDED COATED 6.5IN (ELECTRODE) IMPLANT
BLADE HEX COATED 2.75 (ELECTRODE) ×2 IMPLANT
CABLE HIGH FREQUENCY MONO STRZ (ELECTRODE) ×2 IMPLANT
CELLS DAT CNTRL 66122 CELL SVR (MISCELLANEOUS) IMPLANT
CLIP APPLIE 5 13 M/L LIGAMAX5 (MISCELLANEOUS) IMPLANT
CLIP APPLIE ROT 10 11.4 M/L (STAPLE) IMPLANT
COVER SURGICAL LIGHT HANDLE (MISCELLANEOUS) IMPLANT
DECANTER SPIKE VIAL GLASS SM (MISCELLANEOUS) IMPLANT
DISSECTOR BLUNT TIP ENDO 5MM (MISCELLANEOUS) ×4 IMPLANT
DRAIN CHANNEL 19F RND (DRAIN) ×2 IMPLANT
DRAPE LAPAROSCOPIC ABDOMINAL (DRAPES) ×2 IMPLANT
ELECT REM PT RETURN 9FT ADLT (ELECTROSURGICAL) ×2
ELECTRODE REM PT RTRN 9FT ADLT (ELECTROSURGICAL) ×1 IMPLANT
EVACUATOR SILICONE 100CC (DRAIN) ×2 IMPLANT
FILTER SMOKE EVAC LAPAROSHD (FILTER) IMPLANT
GAUZE SPONGE 4X4 12PLY STRL (GAUZE/BANDAGES/DRESSINGS) ×2 IMPLANT
GLOVE BIOGEL PI IND STRL 7.0 (GLOVE) ×4 IMPLANT
GLOVE BIOGEL PI INDICATOR 7.0 (GLOVE) ×4
GLOVE ECLIPSE 8.0 STRL XLNG CF (GLOVE) ×4 IMPLANT
GLOVE INDICATOR 8.0 STRL GRN (GLOVE) ×4 IMPLANT
GLOVE SURG ORTHO 8.0 STRL STRW (GLOVE) ×2 IMPLANT
GOWN STRL REUS W/TWL LRG LVL3 (GOWN DISPOSABLE) ×2 IMPLANT
GOWN STRL REUS W/TWL XL LVL3 (GOWN DISPOSABLE) ×4 IMPLANT
LEGGING LITHOTOMY PAIR STRL (DRAPES) ×2 IMPLANT
LIGASURE IMPACT 36 18CM CVD LR (INSTRUMENTS) IMPLANT
NS IRRIG 1000ML POUR BTL (IV SOLUTION) ×4 IMPLANT
PACK COLON (CUSTOM PROCEDURE TRAY) ×2 IMPLANT
RELOAD STAPLER BLUE 60MM (STAPLE) ×1 IMPLANT
RTRCTR WOUND ALEXIS 18CM MED (MISCELLANEOUS)
SCISSORS LAP 5X35 DISP (ENDOMECHANICALS) ×2 IMPLANT
SET IRRIG TUBING LAPAROSCOPIC (IRRIGATION / IRRIGATOR) ×2 IMPLANT
SHEARS HARMONIC ACE PLUS 36CM (ENDOMECHANICALS) ×2 IMPLANT
SLEEVE SURGEON STRL (DRAPES) ×4 IMPLANT
SLEEVE XCEL OPT CAN 5 100 (ENDOMECHANICALS) IMPLANT
SOLUTION ANTI FOG 6CC (MISCELLANEOUS) ×2 IMPLANT
SPONGE DRAIN TRACH 4X4 STRL 2S (GAUZE/BANDAGES/DRESSINGS) ×2 IMPLANT
SPONGE LAP 18X18 X RAY DECT (DISPOSABLE) ×2 IMPLANT
STAPLE ECHEON FLEX 60 POW ENDO (STAPLE) ×2 IMPLANT
STAPLER CUT CVD 40MM BLUE (STAPLE) ×2 IMPLANT
STAPLER PROXIMATE 75MM BLUE (STAPLE) ×2 IMPLANT
STAPLER RELOAD BLUE 60MM (STAPLE) ×2
STAPLER VISISTAT 35W (STAPLE) ×2 IMPLANT
SUT ETHILON 3 0 PS 1 (SUTURE) ×2 IMPLANT
SUT PDS AB 1 CTX 36 (SUTURE) IMPLANT
SUT PDS AB 1 TP1 96 (SUTURE) ×4 IMPLANT
SUT PROLENE 2 0 SH DA (SUTURE) ×2 IMPLANT
SUT SILK 2 0 (SUTURE) ×1
SUT SILK 2 0 SH CR/8 (SUTURE) ×2 IMPLANT
SUT SILK 2-0 18XBRD TIE 12 (SUTURE) ×1 IMPLANT
SUT SILK 3 0 (SUTURE) ×1
SUT SILK 3 0 SH CR/8 (SUTURE) ×2 IMPLANT
SUT SILK 3-0 18XBRD TIE 12 (SUTURE) ×1 IMPLANT
SUT VICRYL 2 0 18  UND BR (SUTURE) ×1
SUT VICRYL 2 0 18 UND BR (SUTURE) ×1 IMPLANT
SYS LAPSCP GELPORT 120MM (MISCELLANEOUS)
SYSTEM LAPSCP GELPORT 120MM (MISCELLANEOUS) IMPLANT
TRAY FOLEY W/METER SILVER 14FR (SET/KITS/TRAYS/PACK) ×2 IMPLANT
TRAY FOLEY W/METER SILVER 16FR (SET/KITS/TRAYS/PACK) IMPLANT
TROCAR BLADELESS OPT 5 100 (ENDOMECHANICALS) IMPLANT
TROCAR BLADELESS OPT 5 75 (ENDOMECHANICALS) ×4 IMPLANT
TROCAR XCEL BLUNT TIP 100MML (ENDOMECHANICALS) IMPLANT
TROCAR XCEL NON-BLD 11X100MML (ENDOMECHANICALS) IMPLANT
TROCAR XCEL UNIV SLVE 11M 100M (ENDOMECHANICALS) IMPLANT
TUBING INSUFFLATION 10FT LAP (TUBING) ×2 IMPLANT
YANKAUER SUCT BULB TIP NO VENT (SUCTIONS) ×2 IMPLANT

## 2014-11-08 NOTE — Anesthesia Preprocedure Evaluation (Addendum)
Anesthesia Evaluation  Patient identified by MRN, date of birth, ID band Patient awake    Reviewed: Allergy & Precautions, NPO status , Patient's Chart, lab work & pertinent test results  History of Anesthesia Complications (+) PONV  Airway Mallampati: II  TM Distance: >3 FB Neck ROM: Full    Dental  (+) Teeth Intact, Dental Advisory Given   Pulmonary pneumonia -, resolved,  breath sounds clear to auscultation        Cardiovascular negative cardio ROS  Rhythm:Regular Rate:Normal     Neuro/Psych  Headaches, Anxiety    GI/Hepatic Neg liver ROS, GERD-  ,  Endo/Other  negative endocrine ROS  Renal/GU negative Renal ROS     Musculoskeletal  (+) Arthritis -,   Abdominal   Peds  Hematology negative hematology ROS (+)   Anesthesia Other Findings   Reproductive/Obstetrics                            Lab Results  Component Value Date   WBC 7.7 11/03/2014   HGB 13.8 11/03/2014   HCT 43.4 11/03/2014   MCV 90.0 11/03/2014   PLT 266 11/03/2014   Lab Results  Component Value Date   CREATININE 0.92 11/03/2014   BUN 21* 11/03/2014   NA 140 11/03/2014   K 4.7 11/03/2014   CL 106 11/03/2014   CO2 27 11/03/2014    Anesthesia Physical Anesthesia Plan  ASA: II  Anesthesia Plan: General   Post-op Pain Management:    Induction: Intravenous  Airway Management Planned: Oral ETT  Additional Equipment:   Intra-op Plan:   Post-operative Plan: Extubation in OR  Informed Consent: I have reviewed the patients History and Physical, chart, labs and discussed the procedure including the risks, benefits and alternatives for the proposed anesthesia with the patient or authorized representative who has indicated his/her understanding and acceptance.   Dental advisory given  Plan Discussed with: CRNA  Anesthesia Plan Comments:        Anesthesia Quick Evaluation

## 2014-11-08 NOTE — Brief Op Note (Signed)
11/08/2014  12:06 PM  PATIENT:  Kristen Castillo  74 y.o. 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  POST-OPERATIVE DIAGNOSIS:  Colostomy in Place  PROCEDURE:  Procedure(s): LAPAROSCOPIC COLOSTOMY CLOSURE WITH PARTIAL COLECTOMY (N/A) LAPAROSCOPIC LYSIS OF ADHESIONS FOR 90 MINUTES (N/A)  SURGEON:  Surgeon(s) and Role:    * [name], MD - Primary    * Armandina Gemma, MD - Assisting   ANESTHESIA:   general  EBL:  Total I/O In: 2800 [I.V.:2800] Out: 200 [Urine:150; Blood:50]  BLOOD ADMINISTERED:none  DRAINS: (41) Blake drain(s) in the pelvis   LOCAL MEDICATIONS USED:  MARCAINE     SPECIMEN:  Source of Specimen:  sigmoid colon  DISPOSITION OF SPECIMEN:  PATHOLOGY  COUNTS:  YES  TOURNIQUET:  * No tourniquets in log *  DICTATION: .Dragon Dictation  PLAN OF CARE: Admit to inpatient   PATIENT DISPOSITION:  PACU - hemodynamically stable.   Delay start of Pharmacological VTE agent (>24hrs) due to surgical blood loss or risk of bleeding: no

## 2014-11-08 NOTE — Transfer of Care (Signed)
Immediate Anesthesia Transfer of Care Note  Patient: Kristen Castillo  Procedure(s) Performed: Procedure(s): LAPAROSCOPIC COLOSTOMY CLOSURE WITH PARTIAL COLECTOMY (N/A) LAPAROSCOPIC LYSIS OF ADHESIONS FOR 90 MINUTES (N/A)  Patient Location: PACU  Anesthesia Type:General  Level of Consciousness:  sedated, patient cooperative and responds to stimulation  Airway & Oxygen Therapy:Patient Spontanous Breathing and Patient connected to face mask oxgen  Post-op Assessment:  Report given to PACU RN and Post -op Vital signs reviewed and stable  Post vital signs:  Reviewed and stable  Last Vitals:  Filed Vitals:   11/08/14 0530  BP: 141/66  Pulse: 82  Temp: 36.4 C  Resp: 18    Complications: No apparent anesthesia complications

## 2014-11-08 NOTE — Op Note (Signed)
Operative Note  Kristen Castillo female 74 y.o. 11/08/2014  PREOPERATIVE DX:  Colostomy in place  POSTOPERATIVE DX:  Same  PROCEDURE:   Laparoscopic colostomy closure with partial colectomy, mobilization of splenic flexure, lysis of adhesions (90 minutes)         Surgeon: [name]   Assistants: Armandina Gemma, MD  Anesthesia: General endotracheal anesthesia  Indications:   This is a 74 year old female with a urgent partial colectomy and colostomy for complicated diverticular disease about 6 months ago.  She has completely recovered from that event and now presents for colostomy closure.    Procedure Detail:  She was brought to the operating room placed supine on the operating table and general anesthetic was given. She was placed in the lithotomy position. A Foley catheter was inserted. An oral gastric tube was inserted. The abdominal wall and perineal areas were then sterilely prepped and draped. A sponge is placed over the colostomy followed by an [place].  A small right upper quadrant incision was made. Using a 5 mm Optiview trocar and laparoscope I gained access to the peritoneal cavity and created a pneumoperitoneum. Laparoscope was inserted and the area under the trocar was inspected. There is no evidence of organ injury or bleeding. There were adhesions between the omentum and the anterior abdominal wall and small bowel and anterior abdominal wall toward the midline and in the left abdominal area. A 5 mm trocar was placed in the right lower quadrant. She seems sharp and blunt dissection, I began freeing the adhesions. I was subsequently able to place a 5 mm trocar in the lower midline and a 5 mm trocar and left upper quadrant. I continued to lyse adhesions between the small bowel and the abdominal wall in the left lower quadrant and mid abdomen section. 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 There were some adhesions to the peritoneum  around the colostomy and these were divided sharply. I subsequently mobilized the descending colon by dividing lateral attachments. I then mobilized the splenic flexure.  Next I inspected the pelvis. There were multiple small bowel adhesions present and I mobilized these. I identified a Prolene suture. Small bowel was densely adherent to the staple line of the sigmoid colon pouch. I carefully divided adhesion sharply was able to mobilize the small bowel free from the staple line and the rectosigmoid mesentery. An EEA sizer was then introduced through the anus up into the rectum and into the sigmoid colon easily. There is some residual diverticular disease. I replaced the right lower quadrant 5 mm trocar with a 12 mm trocar to allow me to resect the remaining sigmoid colon using the laparoscopic linear cutting stapler.Then I resected the remaining sigmoid colon down to the area of the rectum and divided its mesentery, close to the colon, with the harmonic scalpel.  Next, the colostomy was approached. A circular incision was made around the colostomy and subcutaneous tissue dissected free from it. Hernia sac was dissected free from the colostomy in a circumferential fashion and it was completely mobilized from the subcutaneous tissue and fascia. I subsequently removed the sigmoid colon segment through the colostomy site fascial defect and sent this to pathology. A #29 EEA anvil was then placed through the colostomy into the descending colon. I then resected the colostomy with the GIA stapler and close the defect. I brought the anvil out the side of the colon and a Baker-type fashion. A pursestring suture of 2-0  Prolene was used to anchor the anvil to the colon. I had plenty of length of colon.  The colon was dropped back into the abdominal cavity. I subsequently closed the colostomy site fascial defect with running double-stranded #1 PDS suture.  Next, a EEA staple anastomosis was performed between the rectum  and descending colon and a Baker-type fashion laparoscopically.  2 complete donuts were noted.  Proctoscopy was performed and air was insufflated into the rectum. The air leak test was performed and there is no evidence of an air leak.  The air was released through the proctoscope. The abdominal cavity was copiously irrigated with saline solution. 4 quadrant inspection was performed. There was no evidence of bleeding or organ injury.  A 5 mm trocar was then placed in the left lower quadrant.  A 19 Blake drain was then placed through the 12 mm right lower quadrant drain and brought out the 5 mm left lower quadrant incision and anchored to the skin with 3-0 nylon. It was then placed in the pelvis.  The trochars were removed and the pneumoperitoneum was released. Trocar sites incisions was closed with staples. The previous colostomy site subcutaneous tissue with was pack was saline moistened gauze. Dry dressings were applied to all areas.  She tolerated the procedure well without any apparent complications and was taken to the recovery room in satisfactory condition.  Estimated Blood Loss:  200 mL         Specimens: Segment of sigmoid colon. Colostomy.        Complications:  * No complications entered in OR log *         Disposition: PACU - hemodynamically stable.         Condition: stable

## 2014-11-08 NOTE — Anesthesia Procedure Notes (Signed)
Procedure Name: Intubation Date/Time: 11/08/2014 7:42 AM Performed by: Montel Clock Pre-anesthesia Checklist: Patient identified, Emergency Drugs available, Suction available, Patient being monitored and Timeout performed Patient Re-evaluated:Patient Re-evaluated prior to inductionOxygen Delivery Method: Circle system utilized Preoxygenation: Pre-oxygenation with 100% oxygen Intubation Type: IV induction Ventilation: Mask ventilation without difficulty Laryngoscope Size: Mac and 3 Grade View: Grade II Tube type: Oral Tube size: 7.5 mm Number of attempts: 2 Airway Equipment and Method: Stylet Placement Confirmation: ETT inserted through vocal cords under direct vision,  positive ETCO2 and breath sounds checked- equal and bilateral Secured at: 23 cm Tube secured with: Tape Dental Injury: Teeth and Oropharynx as per pre-operative assessment  Comments: One attempt by paramedic student. Second attempt by CRNA. ETT easily passed through cords with downward laryngeal pressure. Easy mask.

## 2014-11-08 NOTE — Anesthesia Postprocedure Evaluation (Signed)
  Anesthesia Post-op Note  Patient: Kristen Castillo  Procedure(s) Performed: Procedure(s): LAPAROSCOPIC COLOSTOMY CLOSURE WITH PARTIAL COLECTOMY (N/A) LAPAROSCOPIC LYSIS OF ADHESIONS FOR 90 MINUTES (N/A)  Patient Location: PACU  Anesthesia Type:General  Level of Consciousness: awake and alert   Airway and Oxygen Therapy: Patient Spontanous Breathing  Post-op Pain: none  Post-op Assessment: Post-op Vital signs reviewed              Post-op Vital Signs: Reviewed  Last Vitals:  Filed Vitals:   11/08/14 1507  BP:   Pulse: 97  Temp: 36.6 C  Resp: 15    Complications: No apparent anesthesia complications

## 2014-11-08 NOTE — H&P (Signed)
Kristen Castillo. Kristen Castillo  DOB: [birth year] Married / Language: English / Race: White Female History of Present Illness Patient words: reck.  The patient is a 74 year old female  Note:She underwent urgent Hartmann procedure with drainage of pelvic abscesses for perforated diverticulitis with multiple intra-abdominal abscesses 05/03/14. Her wound has healed. 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 She is eating well and has gained some weight above her baseline.  Barium enema demonstrated some scattered diverticular disease in the descending and transverse colon. It also demonstrated some diverticular disease in the colonic stump in the pelvis. There was a filling defect in the rectum. She passed a large mucus ball which would explain the filling defect soon after the barium enema. No tumors on colonoscopy.   Allergies  Sulfacetamide *CHEMICALS* Codeine Phosphate *ANALGESICS - OPIOID*  Prior to Admission medications   Medication Sig Start Date End Date Taking? Authorizing Provider  ALPRAZolam (XANAX) 0.25 MG tablet Take 1 tablet (0.25 mg total) by mouth 2 (two) times daily as needed for anxiety. 10/04/14  Yes Aleksei Plotnikov V, MD  calcium carbonate (OS-CAL) 600 MG TABS tablet Take 600 mg by mouth 2 (two) times daily with a meal.   Yes Historical Provider, MD  docusate sodium (COLACE) 100 MG capsule Take 300 mg by mouth 2 (two) times daily.    Yes Historical Provider, MD  metroNIDAZOLE (FLAGYL) 500 MG tablet Take 1,000 mg by mouth 3 (three) times daily.   Yes Historical Provider, MD  Multiple Vitamin (MULTIVITAMIN) tablet Take 1 tablet by mouth daily.   Yes Historical Provider, MD  neomycin (MYCIFRADIN) 500 MG tablet Take 1,000 mg by mouth 3 (three) times daily.   Yes Historical Provider, MD  pantoprazole (PROTONIX) 40 MG tablet Take 1 tablet (40 mg total) by mouth daily. 10/18/14  Yes Amy S Esterwood, PA-C  Cholecalciferol 1000 UNITS tablet Take 2,000 Units by mouth 2 (two) times daily.      Historical Provider, MD  HYDROcodone-acetaminophen (NORCO/VICODIN) 5-325 MG per tablet Take 0.5-1 tablets by mouth 2 (two) times daily as needed for moderate pain or severe pain. 10/05/14   Aleksei Plotnikov V, MD  Iron Polysacch Cmplx-B12-FA 150-0.025-1 MG CAPS Take 1 capsule by mouth daily. Patient not taking: Reported on 10/26/2014 05/26/14   Aleksei Plotnikov V, MD  loratadine (CLARITIN) 10 MG tablet Take 1 tablet (10 mg total) by mouth daily. Patient not taking: Reported on 10/26/2014 08/04/14   Lew Dawes V, MD  ondansetron (ZOFRAN ODT) 4 MG disintegrating tablet Take 1 tablet (4 mg total) by mouth every 8 (eight) hours as needed for nausea or vomiting. Patient not taking: Reported on 10/26/2014 05/11/14   Nat Christen, PA-C  ranitidine (ZANTAC) 150 MG tablet Take 1 tablet (150 mg total) by mouth 2 (two) times daily. Patient not taking: Reported on 10/26/2014 08/04/14   Cassandria Anger, MD     Physical Exam   The physical exam findings are as follows: Note:General-well-developed well-nourished in no acute distress.  CV-RRR  Lungs-Clear  Abdomen-well-healed midline incision. Colostomy is low but a fullness around it but is viable.    Assessment & Plan   DIVERTICULITIS OF COLON WITH PERFORATION (562.11  K57.20)  STATUS POST HARTMANN PROCEDURE (V44.3  Z93.3) Impression: She is doing well. Has some residual sigmoid colon on rectal stump demonstrating diverticular disease. Has scattered diverticular disease in the descending and transverse colon She has gained a significant amount of weight compared to her baseline. She would like to proceed with colostomy closure.  Plan: Increase exercise to try to lose some of that weight. Lean protein source. High fiber diet. Avoid red meat. Laparoscopic assisted colostomy closure some time in August. I have explained the procedure and risks of colon resection. Risks include but are not limited to bleeding, infection, wound problems,  anesthesia, anastomotic leak, inability to reverse the colostomy, need for reoperative surgery, injury to intraabominal organs (such as intestine, spleen, kidney, bladder, ureter, etc.), ileus, irregular bowel habits. She seems to understand and agrees with the plan. Preoperative bowel prep instructions have been given to her.  [name], MD

## 2014-11-09 ENCOUNTER — Ambulatory Visit: Payer: Medicare Other | Admitting: Internal Medicine

## 2014-11-09 ENCOUNTER — Encounter (HOSPITAL_COMMUNITY): Payer: Self-pay | Admitting: General Surgery

## 2014-11-09 LAB — BASIC METABOLIC PANEL
ANION GAP: 6 (ref 5–15)
BUN: 18 mg/dL (ref 6–20)
CHLORIDE: 110 mmol/L (ref 101–111)
CO2: 25 mmol/L (ref 22–32)
Calcium: 8.3 mg/dL — ABNORMAL LOW (ref 8.9–10.3)
Creatinine, Ser: 1.05 mg/dL — ABNORMAL HIGH (ref 0.44–1.00)
GFR calc Af Amer: 60 mL/min — ABNORMAL LOW (ref >60)
GFR, EST NON AFRICAN AMERICAN: 51 mL/min — AB (ref >60)
GLUCOSE: 134 mg/dL — AB (ref 65–99)
POTASSIUM: 5 mmol/L (ref 3.5–5.1)
Sodium: 141 mmol/L (ref 135–145)

## 2014-11-09 LAB — CBC
HEMATOCRIT: 34.8 % — AB (ref 36.0–46.0)
HEMOGLOBIN: 11 g/dL — AB (ref 12.0–15.0)
MCH: 28.7 pg (ref 26.0–34.0)
MCHC: 31.6 g/dL (ref 30.0–36.0)
MCV: 90.9 fL (ref 78.0–100.0)
Platelets: 248 10*3/uL (ref 150–400)
RBC: 3.83 MIL/uL — ABNORMAL LOW (ref 3.87–5.11)
RDW: 14.5 % (ref 11.5–15.5)
WBC: 8.9 10*3/uL (ref 4.0–10.5)

## 2014-11-09 MED ORDER — ALVIMOPAN 12 MG PO CAPS
12.0000 mg | ORAL_CAPSULE | Freq: Two times a day (BID) | ORAL | Status: DC
  Administered 2014-11-09 – 2014-11-13 (×9): 12 mg via ORAL
  Filled 2014-11-09 (×10): qty 1

## 2014-11-09 MED ORDER — ALVIMOPAN 12 MG PO CAPS: 12.0000 mg | ORAL_CAPSULE | Freq: Two times a day (BID) | ORAL | Status: DC

## 2014-11-09 NOTE — Progress Notes (Signed)
1 Day Post-Op  Subjective: Had some nausea and wrenching secondary to Dilaudid yesterday afternoon.  Switched to Morphine and those symptoms have resolved.  Objective: Vital signs in last 24 hours: Temp:  [97.3 F (36.3 C)-98.9 F (37.2 C)] 97.3 F (36.3 C) (08/31 0609) Pulse Rate:  [70-98] 84 (08/31 0609) Resp:  [14-28] 18 (08/31 0816) BP: (112-136)/(48-72) 124/48 mmHg (08/31 0609) SpO2:  [95 %-100 %] 96 % (08/31 0816)    Intake/Output from previous day: 08/30 0701 - 08/31 0700 In: 3050 [I.V.:3050] Out: 1760 [Urine:1600; Drains:110; Blood:50] Intake/Output this shift:    PE: General- In NAD Abdomen-soft, some distension, dressings dry, few bowel sounds  Lab Results:   Recent Labs  11/09/14 0540  WBC 8.9  HGB 11.0*  HCT 34.8*  PLT 248   BMET  Recent Labs  11/09/14 0540  NA 141  K 5.0  CL 110  CO2 25  GLUCOSE 134*  BUN 18  CREATININE 1.05*  CALCIUM 8.3*   PT/INR No results for input(s): LABPROT, INR in the last 72 hours. Comprehensive Metabolic Panel:    Component Value Date/Time   NA 141 11/09/2014 0540   NA 140 11/03/2014 1030   K 5.0 11/09/2014 0540   K 4.7 11/03/2014 1030   CL 110 11/09/2014 0540   CL 106 11/03/2014 1030   CO2 25 11/09/2014 0540   CO2 27 11/03/2014 1030   BUN 18 11/09/2014 0540   BUN 21* 11/03/2014 1030   CREATININE 1.05* 11/09/2014 0540   CREATININE 0.92 11/03/2014 1030   GLUCOSE 134* 11/09/2014 0540   GLUCOSE 109* 11/03/2014 1030   CALCIUM 8.3* 11/09/2014 0540   CALCIUM 9.4 11/03/2014 1030   AST 20 11/03/2014 1030   AST 13 05/24/2014 1458   ALT 13* 11/03/2014 1030   ALT 10 05/24/2014 1458   ALKPHOS 86 11/03/2014 1030   ALKPHOS 90 05/24/2014 1458   BILITOT 0.4 11/03/2014 1030   BILITOT 0.3 05/24/2014 1458   PROT 7.6 11/03/2014 1030   PROT 7.9 05/24/2014 1458   ALBUMIN 4.0 11/03/2014 1030   ALBUMIN 3.7 05/24/2014 1458     Studies/Results: No results found.  Anti-infectives: Anti-infectives    Start      Dose/Rate Route Frequency Ordered Stop   11/08/14 1800  cefoTEtan (CEFOTAN) 2 g in dextrose 5 % 50 mL IVPB     2 g 100 mL/hr over 30 Minutes Intravenous Every 12 hours 11/08/14 1419 11/08/14 2229   11/08/14 0553  cefoTEtan (CEFOTAN) 2 g in dextrose 5 % 50 mL IVPB     2 g 100 mL/hr over 30 Minutes Intravenous On call to O.R. 11/08/14 0553 11/08/14 0745      Assessment Active Problems:  Status post laparoscopic colostomy closure with partial colectomy, mobilization of splenic flexure, lysis of adhesions (90 minutes)-stable overnight course; better with Morphine    LOS: 1 day   Plan: OOB, clear liquids, change colostomy site dressing tomorrow.   Kristen Castillo J 11/09/2014

## 2014-11-09 NOTE — Care Management Note (Signed)
Case Management Note  Patient Details  Name: Kristen Castillo MRN: [id number] Date of Birth: 1940/12/23  Subjective/Objective:             S/p Laparoscopic colostomy closure with partial colectomy       Action/Plan: Discharge planning, spoke with patient and husband at bedside. States she discussed with Dr. Today about RN for wound care and bath aide. Patient has used [facility] in the past and would like to use them again. Contacted [facility] for referral, awaiting final orders. Requested Customer service manager and [employer] aide.   Expected Discharge Date:                  Expected Discharge Plan:  [place]  In-House Referral:  NA  Discharge planning Services  CM Consult  Post Acute Care Choice:  Home Health Choice offered to:  Patient  DME Arranged:    DME Agency:     HH Arranged:  RN, Disease Management, Nurse's Aide [place] Agency:  [place]  Status of Service:  Completed, signed off  Medicare Important Message Given:    Date Medicare IM Given:    Medicare IM give by:    Date Additional Medicare IM Given:    Additional Medicare Important Message give by:     If discussed at [place] of Stay Meetings, dates discussed:    Additional Comments:  Guadalupe Maple, RN 11/09/2014, 1:56 PM

## 2014-11-10 NOTE — Progress Notes (Addendum)
2 Days Post-Op  Subjective: No n/v.  Tolerated some clear liquid.  Walked.  No flatus or BM.  Left abdominal soreness when getting OOB.  Objective: Vital signs in last 24 hours: Temp:  [98 F (36.7 C)-99.6 F (37.6 C)] 99.1 F (37.3 C) (09/01 0518) Pulse Rate:  [79-93] 85 (09/01 0518) Resp:  [13-21] 21 (09/01 0518) BP: (126-158)/(52-74) 129/63 mmHg (09/01 0518) SpO2:  [93 %-99 %] 99 % (09/01 0518)    Intake/Output from previous day: 08/31 0701 - 09/01 0700 In: 0  Out: 2105 [Urine:2025; Drains:80] Intake/Output this shift:    PE: General- In NAD Abdomen-soft, some distension, dressings dry, few bowel sounds, serosanguinous drain output, colostomy site wound open and clean  Lab Results:   Recent Labs  11/09/14 0540  WBC 8.9  HGB 11.0*  HCT 34.8*  PLT 248   BMET  Recent Labs  11/09/14 0540  NA 141  K 5.0  CL 110  CO2 25  GLUCOSE 134*  BUN 18  CREATININE 1.05*  CALCIUM 8.3*   PT/INR No results for input(s): LABPROT, INR in the last 72 hours. Comprehensive Metabolic Panel:    Component Value Date/Time   NA 141 11/09/2014 0540   NA 140 11/03/2014 1030   K 5.0 11/09/2014 0540   K 4.7 11/03/2014 1030   CL 110 11/09/2014 0540   CL 106 11/03/2014 1030   CO2 25 11/09/2014 0540   CO2 27 11/03/2014 1030   BUN 18 11/09/2014 0540   BUN 21* 11/03/2014 1030   CREATININE 1.05* 11/09/2014 0540   CREATININE 0.92 11/03/2014 1030   GLUCOSE 134* 11/09/2014 0540   GLUCOSE 109* 11/03/2014 1030   CALCIUM 8.3* 11/09/2014 0540   CALCIUM 9.4 11/03/2014 1030   AST 20 11/03/2014 1030   AST 13 05/24/2014 1458   ALT 13* 11/03/2014 1030   ALT 10 05/24/2014 1458   ALKPHOS 86 11/03/2014 1030   ALKPHOS 90 05/24/2014 1458   BILITOT 0.4 11/03/2014 1030   BILITOT 0.3 05/24/2014 1458   PROT 7.6 11/03/2014 1030   PROT 7.9 05/24/2014 1458   ALBUMIN 4.0 11/03/2014 1030   ALBUMIN 3.7 05/24/2014 1458     Studies/Results: No results found.  Anti-infectives: Anti-infectives     Start     Dose/Rate Route Frequency Ordered Stop   11/08/14 1800  cefoTEtan (CEFOTAN) 2 g in dextrose 5 % 50 mL IVPB     2 g 100 mL/hr over 30 Minutes Intravenous Every 12 hours 11/08/14 1419 11/08/14 2229   11/08/14 0553  cefoTEtan (CEFOTAN) 2 g in dextrose 5 % 50 mL IVPB     2 g 100 mL/hr over 30 Minutes Intravenous On call to O.R. 11/08/14 0553 11/08/14 0745      Assessment Active Problems:  Status post laparoscopic colostomy closure with partial colectomy, mobilization of splenic flexure, lysis of adhesions (90 minutes)-doing well thus far; waiting for return of bowel function   LOS: 2 days   Plan:  Advance to full liquid diet. Decrease IVF. Start dressing changes to colostomy site.   Hollye Pritt J 11/10/2014

## 2014-11-10 NOTE — Care Management Important Message (Signed)
Important Message  Patient Details  Name: SCARLET ABAD MRN: [id number] Date of Birth: [birth year]   Medicare Important Message Given:  Yes-second notification given    Novie, Maggio 11/10/2014, 2:43 [place] Message  Patient Details  Name: MARIESHA VENTURELLA MRN: [id number] Date of Birth: [birth year]   Medicare Important Message Given:  Yes-second notification given    Lania, Zawistowski 11/10/2014, 2:43 PM

## 2014-11-11 NOTE — Progress Notes (Signed)
1600 wound vac applied per [name] Rn patient tolerated well. Kristen Castillo

## 2014-11-11 NOTE — Progress Notes (Signed)
MD order for home wound Vac. [facility] forms filled out and faxed to [facility] rep. CM will continue to follow. Marney Doctor RN,BSN,NCM [phone]

## 2014-11-11 NOTE — Progress Notes (Signed)
1500 wound measurements length 3.5cm width 4.5cm depth 2.0 cm  D [employer]

## 2014-11-11 NOTE — Progress Notes (Signed)
3 Days Post-Op  Subjective: Some nausea when walking.  Took in some of her full liquid diet.  Feels more bloated (about 1.5x normal) today.  No flatus or BM. Objective: Vital signs in last 24 hours: Temp:  [97.7 F (36.5 C)-98.8 F (37.1 C)] 98.8 F (37.1 C) (09/02 0547) Pulse Rate:  [82-87] 82 (09/02 0547) Resp:  [13-19] 18 (09/02 0547) BP: (137-164)/(63-72) 142/68 mmHg (09/02 0547) SpO2:  [94 %-99 %] 99 % (09/02 0547)    Intake/Output from previous day: 09/01 0701 - 09/02 0700 In: 7370.8 [P.O.:990; I.V.:6370.8] Out: 1395 [Urine:1375; Drains:20] Intake/Output this shift:    PE: General- In NAD Abdomen-soft, more distension, hypoactivebowel sounds, serosanguinous drain output, colostomy site wound open and clean  Lab Results:   Recent Labs  11/09/14 0540  WBC 8.9  HGB 11.0*  HCT 34.8*  PLT 248   BMET  Recent Labs  11/09/14 0540  NA 141  K 5.0  CL 110  CO2 25  GLUCOSE 134*  BUN 18  CREATININE 1.05*  CALCIUM 8.3*   PT/INR No results for input(s): LABPROT, INR in the last 72 hours. Comprehensive Metabolic Panel:    Component Value Date/Time   NA 141 11/09/2014 0540   NA 140 11/03/2014 1030   K 5.0 11/09/2014 0540   K 4.7 11/03/2014 1030   CL 110 11/09/2014 0540   CL 106 11/03/2014 1030   CO2 25 11/09/2014 0540   CO2 27 11/03/2014 1030   BUN 18 11/09/2014 0540   BUN 21* 11/03/2014 1030   CREATININE 1.05* 11/09/2014 0540   CREATININE 0.92 11/03/2014 1030   GLUCOSE 134* 11/09/2014 0540   GLUCOSE 109* 11/03/2014 1030   CALCIUM 8.3* 11/09/2014 0540   CALCIUM 9.4 11/03/2014 1030   AST 20 11/03/2014 1030   AST 13 05/24/2014 1458   ALT 13* 11/03/2014 1030   ALT 10 05/24/2014 1458   ALKPHOS 86 11/03/2014 1030   ALKPHOS 90 05/24/2014 1458   BILITOT 0.4 11/03/2014 1030   BILITOT 0.3 05/24/2014 1458   PROT 7.6 11/03/2014 1030   PROT 7.9 05/24/2014 1458   ALBUMIN 4.0 11/03/2014 1030   ALBUMIN 3.7 05/24/2014 1458     Studies/Results: No results  found.  Anti-infectives: Anti-infectives    Start     Dose/Rate Route Frequency Ordered Stop   11/08/14 1800  cefoTEtan (CEFOTAN) 2 g in dextrose 5 % 50 mL IVPB     2 g 100 mL/hr over 30 Minutes Intravenous Every 12 hours 11/08/14 1419 11/08/14 2229   11/08/14 0553  cefoTEtan (CEFOTAN) 2 g in dextrose 5 % 50 mL IVPB     2 g 100 mL/hr over 30 Minutes Intravenous On call to O.R. 11/08/14 0553 11/08/14 0745      Assessment Active Problems:  Status post laparoscopic colostomy closure with partial colectomy, mobilization of splenic flexure, lysis of adhesions (90 minutes)-has a postop ileus; open wound is clean  LOS: 3 days   Plan:  Leave diet as is. VAC to colostomy site wound.  Wait for ileus to resolve.  Jaquita Bessire J 11/11/2014

## 2014-11-12 NOTE — Progress Notes (Signed)
Patient ID: Kristen Castillo, female   DOB: 1940/06/23, 75 y.o.   MRN: [id number] 4 Days Post-Op  Subjective: No change. Still no flatus or bowel movements. No nausea or vomiting. Has some abdominal pressure and discomfort but no significant pain.  Objective: Vital signs in last 24 hours: Temp:  [97.9 F (36.6 C)-99.1 F (37.3 C)] 99.1 F (37.3 C) (09/03 0618) Pulse Rate:  [78-85] 79 (09/03 0618) Resp:  [17-20] 20 (09/03 0800) BP: (139-158)/(63-85) 156/71 mmHg (09/03 0618) SpO2:  [96 %-99 %] 97 % (09/03 0800) Last BM Date: 11/07/14  Intake/Output from previous day: 09/02 0701 - 09/03 0700 In: 2640 [P.O.:840; I.V.:1800] Out: 1515 [JSEGB:1517; Drains:40] Intake/Output this shift: Total I/O In: -  Out: 318 [Urine:300; Drains:18]  General appearance: alert, cooperative and no distress GI: mild distention. A few bowel sounds. Nontender. Incision/Wound: VAC in place at colostomy site. Incisions clean without evidence of infection  Lab Results:  No results for input(s): WBC, HGB, HCT, PLT in the last 72 hours. BMET No results for input(s): NA, K, CL, CO2, GLUCOSE, BUN, CREATININE, CALCIUM in the last 72 hours.   Studies/Results: No results found.  Anti-infectives: Anti-infectives    Start     Dose/Rate Route Frequency Ordered Stop   11/08/14 1800  cefoTEtan (CEFOTAN) 2 g in dextrose 5 % 50 mL IVPB     2 g 100 mL/hr over 30 Minutes Intravenous Every 12 hours 11/08/14 1419 11/08/14 2229   11/08/14 0553  cefoTEtan (CEFOTAN) 2 g in dextrose 5 % 50 mL IVPB     2 g 100 mL/hr over 30 Minutes Intravenous On call to O.R. 11/08/14 0553 11/08/14 0745      Assessment/Plan: s/p Procedure(s): LAPAROSCOPIC COLOSTOMY CLOSURE WITH PARTIAL COLECTOMY LAPAROSCOPIC LYSIS OF ADHESIONS FOR 90 MINUTES Postoperative ileus. Continue sips of clear liquids today. She is up out of bed and ambulating. Check labs tomorrow.   LOS: 4 days    Norfleet Capers T 11/12/2014

## 2014-11-13 LAB — BASIC METABOLIC PANEL
ANION GAP: 5 (ref 5–15)
BUN: 6 mg/dL (ref 6–20)
CALCIUM: 8.6 mg/dL — AB (ref 8.9–10.3)
CO2: 25 mmol/L (ref 22–32)
CREATININE: 0.8 mg/dL (ref 0.44–1.00)
Chloride: 107 mmol/L (ref 101–111)
Glucose, Bld: 123 mg/dL — ABNORMAL HIGH (ref 65–99)
Potassium: 3.8 mmol/L (ref 3.5–5.1)
SODIUM: 137 mmol/L (ref 135–145)

## 2014-11-13 LAB — CBC
HCT: 33.4 % — ABNORMAL LOW (ref 36.0–46.0)
Hemoglobin: 10.8 g/dL — ABNORMAL LOW (ref 12.0–15.0)
MCH: 28.6 pg (ref 26.0–34.0)
MCHC: 32.3 g/dL (ref 30.0–36.0)
MCV: 88.6 fL (ref 78.0–100.0)
PLATELETS: 258 10*3/uL (ref 150–400)
RBC: 3.77 MIL/uL — ABNORMAL LOW (ref 3.87–5.11)
RDW: 13.8 % (ref 11.5–15.5)
WBC: 6.3 10*3/uL (ref 4.0–10.5)

## 2014-11-13 MED ORDER — OXYCODONE-ACETAMINOPHEN 5-325 MG PO TABS
1.0000 | ORAL_TABLET | ORAL | Status: DC | PRN
  Administered 2014-11-13 – 2014-11-15 (×9): 1 via ORAL
  Filled 2014-11-13 (×10): qty 1

## 2014-11-13 NOTE — Progress Notes (Signed)
Patient ID: Kristen Castillo, female   DOB: 19-Oct-1940, 74 y.o.   MRN: [id number] 5 Days Post-Op  Subjective: Feels better today. Has had some flatus and passed some old blood. Pain is gradually better. No nausea and wants to go back and try full liquids.  Objective: Vital signs in last 24 hours: Temp:  [97.8 F (36.6 C)-98.1 F (36.7 C)] 97.8 F (36.6 C) (09/04 0544) Pulse Rate:  [71-83] 72 (09/04 0544) Resp:  [15-21] 21 (09/04 0544) BP: (132-151)/(64-90) 132/64 mmHg (09/04 0544) SpO2:  [93 %-98 %] 96 % (09/04 0544) Last BM Date: 11/07/14  Intake/Output from previous day: 09/03 0701 - 09/04 0700 In: 2400 [P.O.:600; I.V.:1800] Out: 1848 [Urine:1800; Drains:48] Intake/Output this shift:    General appearance: alert, cooperative and no distress GI: mild distention. Minimal generalized tenderness. Incision/Wound: VAC in place left lower quadrant. Incisions clean and dry. Blisters from previous Tegaderm are dry and clean  Lab Results:   Recent Labs  11/13/14 0511  WBC 6.3  HGB 10.8*  HCT 33.4*  PLT 258   BMET  Recent Labs  11/13/14 0511  NA 137  K 3.8  CL 107  CO2 25  GLUCOSE 123*  BUN 6  CREATININE 0.80  CALCIUM 8.6*     Studies/Results: No results found.  Anti-infectives: Anti-infectives    Start     Dose/Rate Route Frequency Ordered Stop   11/08/14 1800  cefoTEtan (CEFOTAN) 2 g in dextrose 5 % 50 mL IVPB     2 g 100 mL/hr over 30 Minutes Intravenous Every 12 hours 11/08/14 1419 11/08/14 2229   11/08/14 0553  cefoTEtan (CEFOTAN) 2 g in dextrose 5 % 50 mL IVPB     2 g 100 mL/hr over 30 Minutes Intravenous On call to O.R. 11/08/14 0553 11/08/14 0745      Assessment/Plan: s/p Procedure(s): LAPAROSCOPIC COLOSTOMY CLOSURE WITH PARTIAL COLECTOMY LAPAROSCOPIC LYSIS OF ADHESIONS FOR 90 MINUTES Postoperative ileus which appears to be gradually resolving. Try full liquid diet again. She is ambulating. Change to oral pain medication.   LOS: 5 days     Brittiney Dicostanzo T 11/13/2014

## 2014-11-14 MED ORDER — PANTOPRAZOLE SODIUM 40 MG PO TBEC
40.0000 mg | DELAYED_RELEASE_TABLET | Freq: Every day | ORAL | Status: DC
  Administered 2014-11-15: 40 mg via ORAL
  Filled 2014-11-14: qty 1

## 2014-11-14 NOTE — Progress Notes (Signed)
Patient ID: Kristen Castillo, female   DOB: 28-Dec-1940, 74 y.o.   MRN: [id number] 6 Days Post-Op  Subjective: Very happy this morning. Has had several loose bowel movements. Abdomen feels much better. Tolerating full liquids without difficulty.  Objective: Vital signs in last 24 hours: Temp:  [97.7 F (36.5 C)-98.6 F (37 C)] 97.9 F (36.6 C) (09/05 0615) Pulse Rate:  [72-88] 74 (09/05 0615) Resp:  [16-24] 16 (09/05 0800) BP: (134-174)/(41-72) 174/72 mmHg (09/05 0615) SpO2:  [93 %-97 %] 97 % (09/05 0800) Last BM Date: 11/13/14  Intake/Output from previous day: 09/04 0701 - 09/05 0700 In: 2520 [P.O.:720; I.V.:1800] Out: 2470 [Urine:2350; Drains:120] Intake/Output this shift: Total I/O In: 512.5 [P.O.:240; I.V.:272.5] Out: 620 [Urine:600; Drains:20]  General appearance: alert, cooperative and no distress GI: normal findings: soft, non-tender and nondistended Incision/Wound: clean and dry without evidence of infection. VAC in place left lower quadrant. Minimal serosanguineous JP drainage.  Lab Results:   Recent Labs  11/13/14 0511  WBC 6.3  HGB 10.8*  HCT 33.4*  PLT 258   BMET  Recent Labs  11/13/14 0511  NA 137  K 3.8  CL 107  CO2 25  GLUCOSE 123*  BUN 6  CREATININE 0.80  CALCIUM 8.6*     Studies/Results: No results found.  Anti-infectives: Anti-infectives    Start     Dose/Rate Route Frequency Ordered Stop   11/08/14 1800  cefoTEtan (CEFOTAN) 2 g in dextrose 5 % 50 mL IVPB     2 g 100 mL/hr over 30 Minutes Intravenous Every 12 hours 11/08/14 1419 11/08/14 2229   11/08/14 0553  cefoTEtan (CEFOTAN) 2 g in dextrose 5 % 50 mL IVPB     2 g 100 mL/hr over 30 Minutes Intravenous On call to O.R. 11/08/14 0553 11/08/14 0745      Assessment/Plan: s/p Procedure(s): LAPAROSCOPIC COLOSTOMY CLOSURE WITH PARTIAL COLECTOMY LAPAROSCOPIC LYSIS OF ADHESIONS FOR 90 MINUTES Doing well. Ileus resolving. Advance diet. Discontinue PCA. Anticipate discharge  tomorrow.   LOS: 6 days    Kristen Castillo T 11/14/2014

## 2014-11-14 NOTE — Progress Notes (Signed)
PCA discontinued.  Morphine 22cc wasted in sink.  Witnessed by Adult nurse.

## 2014-11-15 MED ORDER — ONDANSETRON 4 MG PO TBDP: 4.0000 mg | ORAL_TABLET | Freq: Four times a day (QID) | ORAL | Status: DC | PRN

## 2014-11-15 MED ORDER — OXYCODONE HCL 5 MG PO TABS: 5.0000 mg | ORAL_TABLET | ORAL | Status: DC | PRN

## 2014-11-15 NOTE — Care Management Note (Signed)
Case Management Note  Patient Details  Name: Kristen Castillo MRN: [id number] Date of Birth: Feb 03, 1941  Subjective/Objective:          S/p Laparoscopic colostomy closure with partial colectomy, mobilization of splenic flexure, lysis of adhesions           Action/Plan: Discharge planning, spoke with patient and friend at beside. Wound Vac arranged and patient has been contacted for delivery of supplies. Once supplies are here patient may d/c home. No further HH needs, [facility] notified of pending d/c.   Expected Discharge Date:                  Expected Discharge Plan:  [place]  In-House Referral:  NA  Discharge planning Services  CM Consult  Post Acute Care Choice:  Home Health Choice offered to:  Patient  DME Arranged:  Negative pressure wound device DME Agency:  [facility]  HH Arranged:  RN, Disease Management, Nurse's Aide [place] Agency:  [facility]  Status of Service:  Completed, signed off  Medicare Important Message Given:  Yes-second notification given Date Medicare IM Given:    Medicare IM give by:    Date Additional Medicare IM Given:    Additional Medicare Important Message give by:     If discussed at [place] of Stay Meetings, dates discussed:    Additional Comments:  Guadalupe Maple, RN 11/15/2014, 12:15 PM

## 2014-11-15 NOTE — Discharge Instructions (Signed)
[facility], [place] [phone]  ABDOMINAL SURGERY: POST OP INSTRUCTIONS  Always review your discharge instruction sheet given to you by the facility where your surgery was performed.  IF YOU HAVE DISABILITY OR FAMILY LEAVE FORMS, YOU MUST BRING THEM TO THE OFFICE FOR PROCESSING.  PLEASE DO NOT GIVE THEM TO YOUR DOCTOR.  1. A prescription for pain medication may be given to you upon discharge.  Take your pain medication as prescribed, if needed.  If narcotic pain medicine is not needed, then you may take acetaminophen (Tylenol) or ibuprofen (Advil) as needed. 2. Take your usually prescribed medications unless otherwise directed. 3. If you need a refill on your pain medication, please contact your pharmacy. They will contact our office to request authorization.  Prescriptions will not be filled after 5pm or on week-ends. 4. You should follow a bland diet.  Drink plenty of fluids.  Do not overeat. 5. Most patients will experience some swelling and bruising in the area of the incision. Ice pack will help. Swelling and bruising can take several days to resolve..  6. It is common to experience some constipation if taking pain medication after surgery.  Increasing fluid intake and taking a stool softener will usually help or prevent this problem from occurring.  A mild laxative (Milk of Magnesia or Miralax) should be taken according to package directions if there are no bowel movements after 48 hours. 7.  You may have steri-strips (small skin tapes) in place directly over the incision.  These strips should be left on the skin.  If your surgeon used skin glue on the incision, you may shower in 24 hours.  The glue will flake off over the next 2-3 weeks.  Any sutures or staples will be removed at the office during your follow-up visit. You may find that a light gauze bandage over your incision may keep your staples from being rubbed or pulled. You may shower and replace the bandage  daily. 8. ACTIVITIES:  You may resume regular (light) daily activities beginning the next day--such as daily self-care, walking, climbing stairs--gradually increasing activities as tolerated.  You may have sexual intercourse when it is comfortable.  Refrain from any heavy lifting or straining for 6-8 weeks. a. You may drive when you no longer are taking prescription pain medication, you can comfortably wear a seatbelt, and you can safely maneuver your car and apply brakes b. Return to Work: ___________________________________ 21. You should see your doctor in the office for a follow-up appointment approximately two weeks after your surgery.  Make sure that you call for this appointment within a day or two after you arrive home to insure a convenient appointment time. OTHER INSTRUCTIONS:  _Wound VAC change Mon., Weds., Fri. Home health nurses will do this.____________________________________________________________ _____________________________________________________________  WHEN TO CALL YOUR DOCTOR: 1. Fever over 101.0 2. Inability to urinate 3. Nausea and/or vomiting 4. Extreme swelling or bruising 5. Continued bleeding from incision. 6. Increased pain, redness, or drainage from the incision.   The clinic staff is available to answer your questions during regular business hours.  Please dont hesitate to call and ask to speak to one of the nurses if you have concerns.  For further questions, please visit [email or web address]

## 2014-11-15 NOTE — Progress Notes (Signed)
7 Days Post-Op  Subjective: Feels good.  Tolerating solid diet.  Bowels moving.  Objective: Vital signs in last 24 hours: Temp:  [98 F (36.7 C)-98.4 F (36.9 C)] 98.4 F (36.9 C) (09/06 0535) Pulse Rate:  [77-87] 77 (09/06 0535) Resp:  [16-17] 17 (09/06 0535) BP: (110-151)/(62-73) 145/73 mmHg (09/06 0535) SpO2:  [94 %-98 %] 94 % (09/06 0535) Last BM Date: 11/13/14  Intake/Output from previous day: 09/05 0701 - 09/06 0700 In: 1292.5 [P.O.:1020; I.V.:272.5] Out: 4599 [Urine:1700; Drains:55] Intake/Output this shift:    PE: General- In NAD Abdomen-soft, VAC on, low volume drain output-drain removed, trocar site wounds clean-staples removed.  Lab Results:   Recent Labs  11/13/14 0511  WBC 6.3  HGB 10.8*  HCT 33.4*  PLT 258   BMET  Recent Labs  11/13/14 0511  NA 137  K 3.8  CL 107  CO2 25  GLUCOSE 123*  BUN 6  CREATININE 0.80  CALCIUM 8.6*   PT/INR No results for input(s): LABPROT, INR in the last 72 hours. Comprehensive Metabolic Panel:    Component Value Date/Time   NA 137 11/13/2014 0511   NA 141 11/09/2014 0540   K 3.8 11/13/2014 0511   K 5.0 11/09/2014 0540   CL 107 11/13/2014 0511   CL 110 11/09/2014 0540   CO2 25 11/13/2014 0511   CO2 25 11/09/2014 0540   BUN 6 11/13/2014 0511   BUN 18 11/09/2014 0540   CREATININE 0.80 11/13/2014 0511   CREATININE 1.05* 11/09/2014 0540   GLUCOSE 123* 11/13/2014 0511   GLUCOSE 134* 11/09/2014 0540   CALCIUM 8.6* 11/13/2014 0511   CALCIUM 8.3* 11/09/2014 0540   AST 20 11/03/2014 1030   AST 13 05/24/2014 1458   ALT 13* 11/03/2014 1030   ALT 10 05/24/2014 1458   ALKPHOS 86 11/03/2014 1030   ALKPHOS 90 05/24/2014 1458   BILITOT 0.4 11/03/2014 1030   BILITOT 0.3 05/24/2014 1458   PROT 7.6 11/03/2014 1030   PROT 7.9 05/24/2014 1458   ALBUMIN 4.0 11/03/2014 1030   ALBUMIN 3.7 05/24/2014 1458     Studies/Results: No results found.  Anti-infectives: Anti-infectives    Start     Dose/Rate Route  Frequency Ordered Stop   11/08/14 1800  cefoTEtan (CEFOTAN) 2 g in dextrose 5 % 50 mL IVPB     2 g 100 mL/hr over 30 Minutes Intravenous Every 12 hours 11/08/14 1419 11/08/14 2229   11/08/14 0553  cefoTEtan (CEFOTAN) 2 g in dextrose 5 % 50 mL IVPB     2 g 100 mL/hr over 30 Minutes Intravenous On call to O.R. 11/08/14 0553 11/08/14 0745      Assessment Status post laparoscopic colostomy closure with partial colectomy, mobilization of splenic flexure, lysis of adhesions (90 minutes)-feels much better; tolerating diet; bowels moving.   LOS: 7 days   Plan: Discharge.  VAC change MWF.  Discharge instructions given.   Elvis Boot J 11/15/2014

## 2014-11-15 NOTE — Progress Notes (Signed)
Patient's vital signs are stable, tolerating regular diet, having BM, pain controlled with oral medications.  Home health thru [facility] arranged / [facility] notified of wound vac order.  Discharge instructions reviewed with patient.  Patient discharge to home with caregiver.

## 2014-11-22 NOTE — Discharge Summary (Signed)
Physician Discharge Summary  Patient ID: Kristen Castillo MRN: [id number] DOB/AGE: 03/24/1940 74 y.o.  Admit date: 11/08/2014 Discharge date: 11/15/2014  Admission Diagnoses:  Colostomy in place Discharge Diagnoses:  Active Problems:   Colostomy in place   Postoperative ileus   Discharged Condition: good  Hospital Course: she underwent laparoscopic assisted colostomy closure on the date of admission. She did develop a postoperative ileus which subsequently slowly resolved. A VAC was placed on the colostomy site wound which she tolerated well. Her bowel function recovered and she was able to be discharged on 11/15/2014. She will go with a home VAC. Discharge instructions were given to her.   Discharge Exam: Blood pressure 145/73, pulse 77, temperature 98.4 F (36.9 C), temperature source Oral, resp. rate 17, height 5\' 2"  (1.575 m), weight 61.689 kg (136 lb), SpO2 94 %.   Disposition: 06-Home-Health Care Svc     Medication List    STOP taking these medications        docusate sodium 100 MG capsule  Commonly known as:  COLACE     HYDROcodone-acetaminophen 5-325 MG per tablet  Commonly known as:  NORCO/VICODIN     metroNIDAZOLE 500 MG tablet  Commonly known as:  FLAGYL     neomycin 500 MG tablet  Commonly known as:  MYCIFRADIN      TAKE these medications        ALPRAZolam 0.25 MG tablet  Commonly known as:  XANAX  Take 1 tablet (0.25 mg total) by mouth 2 (two) times daily as needed for anxiety.     calcium carbonate 600 MG Tabs tablet  Commonly known as:  OS-CAL  Take 600 mg by mouth 2 (two) times daily with a meal.     Cholecalciferol 1000 UNITS tablet  Take 2,000 Units by mouth 2 (two) times daily.     Iron Polysacch Cmplx-B12-FA 150-0.025-1 MG Caps  Take 1 capsule by mouth daily.     loratadine 10 MG tablet  Commonly known as:  CLARITIN  Take 1 tablet (10 mg total) by mouth daily.     multivitamin tablet  Take 1 tablet by mouth daily.     ondansetron 4  MG disintegrating tablet  Commonly known as:  ZOFRAN ODT  Take 1 tablet (4 mg total) by mouth every 6 (six) hours as needed for nausea or vomiting.     oxyCODONE 5 MG immediate release tablet  Commonly known as:  Oxy IR/ROXICODONE  Take 1-2 tablets (5-10 mg total) by mouth every 4 (four) hours as needed for moderate pain, severe pain or breakthrough pain.     pantoprazole 40 MG tablet  Commonly known as:  PROTONIX  Take 1 tablet (40 mg total) by mouth daily.     ranitidine 150 MG tablet  Commonly known as:  ZANTAC  Take 1 tablet (150 mg total) by mouth 2 (two) times daily.           Follow-up Information    Follow up with [facility].   Why:  nurse and aide   Contact information:   [address] [place] [address] [phone]       Signed: [name] 11/22/2014, 11:26 AM

## 2014-11-25 ENCOUNTER — Ambulatory Visit (INDEPENDENT_AMBULATORY_CARE_PROVIDER_SITE_OTHER): Payer: Medicare Other

## 2014-11-25 DIAGNOSIS — Z23 Encounter for immunization: Secondary | ICD-10-CM

## 2014-12-05 ENCOUNTER — Encounter: Payer: Self-pay | Admitting: Internal Medicine

## 2014-12-05 ENCOUNTER — Ambulatory Visit (INDEPENDENT_AMBULATORY_CARE_PROVIDER_SITE_OTHER): Payer: Medicare Other | Admitting: Internal Medicine

## 2014-12-05 VITALS — BP 130/80 | HR 78 | Temp 98.2°F | Wt 129.0 lb

## 2014-12-05 DIAGNOSIS — M810 Age-related osteoporosis without current pathological fracture: Secondary | ICD-10-CM | POA: Diagnosis not present

## 2014-12-05 DIAGNOSIS — E559 Vitamin D deficiency, unspecified: Secondary | ICD-10-CM | POA: Diagnosis not present

## 2014-12-05 DIAGNOSIS — K219 Gastro-esophageal reflux disease without esophagitis: Secondary | ICD-10-CM

## 2014-12-05 DIAGNOSIS — E441 Mild protein-calorie malnutrition: Secondary | ICD-10-CM

## 2014-12-05 NOTE — Assessment & Plan Note (Signed)
Doing well 

## 2014-12-05 NOTE — Progress Notes (Signed)
Pre visit review using our clinic review tool, if applicable. No additional management support is needed unless otherwise documented below in the visit note. 

## 2014-12-05 NOTE — Assessment & Plan Note (Signed)
Labs in 2 mo

## 2014-12-05 NOTE — Progress Notes (Signed)
Subjective:  Patient ID: Kristen Castillo, female    DOB: Feb 08, 1941  Age: 74 y.o. MRN: [id number]  CC: No chief complaint on file.   HPI Kristen Castillo presents for post-op check, wt loss, malnutrition, anxiety  Outpatient Prescriptions Prior to Visit  Medication Sig Dispense Refill  . Cholecalciferol 1000 UNITS tablet Take 2,000 Units by mouth 2 (two) times daily.     Marland Kitchen loratadine (CLARITIN) 10 MG tablet Take 1 tablet (10 mg total) by mouth daily. 100 tablet 3  . Multiple Vitamin (MULTIVITAMIN) tablet Take 1 tablet by mouth daily.    . pantoprazole (PROTONIX) 40 MG tablet Take 1 tablet (40 mg total) by mouth daily. 30 tablet 6  . ALPRAZolam (XANAX) 0.25 MG tablet Take 1 tablet (0.25 mg total) by mouth 2 (two) times daily as needed for anxiety. (Patient not taking: Reported on 12/05/2014) 30 tablet 5  . calcium carbonate (OS-CAL) 600 MG TABS tablet Take 600 mg by mouth 2 (two) times daily with a meal.    . Iron Polysacch Cmplx-B12-FA 150-0.025-1 MG CAPS Take 1 capsule by mouth daily. (Patient not taking: Reported on 12/05/2014) 90 each 1  . ondansetron (ZOFRAN ODT) 4 MG disintegrating tablet Take 1 tablet (4 mg total) by mouth every 6 (six) hours as needed for nausea or vomiting. (Patient not taking: Reported on 12/05/2014) 30 tablet 0  . oxyCODONE (OXY IR/ROXICODONE) 5 MG immediate release tablet Take 1-2 tablets (5-10 mg total) by mouth every 4 (four) hours as needed for moderate pain, severe pain or breakthrough pain. (Patient not taking: Reported on 12/05/2014) 40 tablet 0  . ranitidine (ZANTAC) 150 MG tablet Take 1 tablet (150 mg total) by mouth 2 (two) times daily. (Patient not taking: Reported on 12/05/2014) 60 tablet 5   No facility-administered medications prior to visit.    ROS Review of Systems  Constitutional: Negative for chills, activity change, appetite change, fatigue and unexpected weight change.  HENT: Negative for congestion, mouth sores and sinus pressure.   Eyes:  Negative for visual disturbance.  Respiratory: Negative for cough and chest tightness.   Gastrointestinal: Positive for diarrhea. Negative for nausea and abdominal pain.  Genitourinary: Negative for frequency, difficulty urinating and vaginal pain.  Musculoskeletal: Negative for back pain and gait problem.  Skin: Negative for pallor and rash.  Neurological: Negative for dizziness, tremors, weakness, numbness and headaches.  Psychiatric/Behavioral: Negative for suicidal ideas, confusion and sleep disturbance.    Objective:  BP 130/80 mmHg  Pulse 78  Temp(Src) 98.2 F (36.8 C) (Oral)  Wt 129 lb (58.514 kg)  SpO2 96%  BP Readings from Last 3 Encounters:  12/05/14 130/80  11/15/14 145/73  11/03/14 174/74    Wt Readings from Last 3 Encounters:  12/05/14 129 lb (58.514 kg)  11/08/14 136 lb (61.689 kg)  11/03/14 136 lb (61.689 kg)    Physical Exam  Constitutional: She appears well-developed. No distress.  HENT:  Head: Normocephalic.  Right Ear: External ear normal.  Left Ear: External ear normal.  Nose: Nose normal.  Mouth/Throat: Oropharynx is clear and moist.  Eyes: Conjunctivae are normal. Pupils are equal, round, and reactive to light. Right eye exhibits no discharge. Left eye exhibits no discharge.  Neck: Normal range of motion. Neck supple. No JVD present. No tracheal deviation present. No thyromegaly present.  Cardiovascular: Normal rate, regular rhythm and normal heart sounds.   Pulmonary/Chest: No stridor. No respiratory distress. She has no wheezes.  Abdominal: Soft. Bowel sounds are normal. She exhibits  no distension and no mass. There is no tenderness. There is no rebound and no guarding.  Musculoskeletal: She exhibits no edema or tenderness.  Lymphadenopathy:    She has no cervical adenopathy.  Neurological: She displays normal reflexes. No cranial nerve deficit. She exhibits normal muscle tone. Coordination normal.  Skin: No rash noted. No erythema.    Psychiatric: She has a normal mood and affect. Her behavior is normal. Judgment and thought content normal.  Vac device is attached  Lab Results  Component Value Date   WBC 6.3 11/13/2014   HGB 10.8* 11/13/2014   HCT 33.4* 11/13/2014   PLT 258 11/13/2014   GLUCOSE 123* 11/13/2014   CHOL 186 04/14/2013   TRIG 193* 05/09/2014   HDL 51.90 04/14/2013   LDLCALC 104* 04/14/2013   ALT 13* 11/03/2014   AST 20 11/03/2014   NA 137 11/13/2014   K 3.8 11/13/2014   CL 107 11/13/2014   CREATININE 0.80 11/13/2014   BUN 6 11/13/2014   CO2 25 11/13/2014   TSH 2.63 05/24/2014   INR 1.03 11/03/2014   HGBA1C 6.0* 11/03/2014    No results found.  Assessment & Plan:   Diagnoses and all orders for this visit:  Vitamin D deficiency -     Hepatic function panel; Future -     Basic metabolic panel; Future -     CBC with Differential/Platelet; Future -     IBC panel; Future -     TSH; Future -     Vitamin B12; Future -     Vit D  25 hydroxy (rtn osteoporosis monitoring); Future -     Albumin; Future  Gastroesophageal reflux disease without esophagitis -     Hepatic function panel; Future -     Basic metabolic panel; Future -     CBC with Differential/Platelet; Future -     IBC panel; Future -     TSH; Future -     Vitamin B12; Future -     Vit D  25 hydroxy (rtn osteoporosis monitoring); Future -     Albumin; Future  Mild malnutrition -     Hepatic function panel; Future -     Basic metabolic panel; Future -     CBC with Differential/Platelet; Future -     IBC panel; Future -     TSH; Future -     Vitamin B12; Future -     Vit D  25 hydroxy (rtn osteoporosis monitoring); Future -     Albumin; Future  Osteoporosis -     Hepatic function panel; Future -     Basic metabolic panel; Future -     CBC with Differential/Platelet; Future -     IBC panel; Future -     TSH; Future -     Vitamin B12; Future -     Vit D  25 hydroxy (rtn osteoporosis monitoring); Future -     Albumin;  Future   I am having Kristen Castillo maintain her Iron Polysacch Cmplx-B12-FA, Cholecalciferol, calcium carbonate, loratadine, ranitidine, multivitamin, ALPRAZolam, pantoprazole, oxyCODONE, and ondansetron.  No orders of the defined types were placed in this encounter.     Follow-up: Return in about 2 months (around 02/04/2015) for a follow-up visit.  Walker Kehr, MD

## 2014-12-05 NOTE — Assessment & Plan Note (Signed)
On Vit D 

## 2014-12-05 NOTE — Assessment & Plan Note (Signed)
Vit D 

## 2015-01-19 ENCOUNTER — Other Ambulatory Visit (INDEPENDENT_AMBULATORY_CARE_PROVIDER_SITE_OTHER): Payer: Medicare Other

## 2015-01-19 DIAGNOSIS — K219 Gastro-esophageal reflux disease without esophagitis: Secondary | ICD-10-CM

## 2015-01-19 DIAGNOSIS — M810 Age-related osteoporosis without current pathological fracture: Secondary | ICD-10-CM

## 2015-01-19 DIAGNOSIS — E441 Mild protein-calorie malnutrition: Secondary | ICD-10-CM

## 2015-01-19 DIAGNOSIS — E559 Vitamin D deficiency, unspecified: Secondary | ICD-10-CM | POA: Diagnosis not present

## 2015-01-19 LAB — HEPATIC FUNCTION PANEL
ALBUMIN: 3.9 g/dL (ref 3.5–5.2)
ALT: 8 U/L (ref 0–35)
AST: 14 U/L (ref 0–37)
Alkaline Phosphatase: 73 U/L (ref 39–117)
Bilirubin, Direct: 0 mg/dL (ref 0.0–0.3)
Total Bilirubin: 0.4 mg/dL (ref 0.2–1.2)
Total Protein: 7.3 g/dL (ref 6.0–8.3)

## 2015-01-19 LAB — VITAMIN B12: VITAMIN B 12: 559 pg/mL (ref 211–911)

## 2015-01-19 LAB — CBC WITH DIFFERENTIAL/PLATELET
BASOS ABS: 0.1 10*3/uL (ref 0.0–0.1)
BASOS PCT: 1 % (ref 0.0–3.0)
Eosinophils Absolute: 0.2 10*3/uL (ref 0.0–0.7)
Eosinophils Relative: 2.1 % (ref 0.0–5.0)
HEMATOCRIT: 39.1 % (ref 36.0–46.0)
HEMOGLOBIN: 13 g/dL (ref 12.0–15.0)
LYMPHS PCT: 25.5 % (ref 12.0–46.0)
Lymphs Abs: 1.9 10*3/uL (ref 0.7–4.0)
MCHC: 33.3 g/dL (ref 30.0–36.0)
MCV: 85.6 fl (ref 78.0–100.0)
MONOS PCT: 6.8 % (ref 3.0–12.0)
Monocytes Absolute: 0.5 10*3/uL (ref 0.1–1.0)
NEUTROS ABS: 4.9 10*3/uL (ref 1.4–7.7)
Neutrophils Relative %: 64.6 % (ref 43.0–77.0)
PLATELETS: 333 10*3/uL (ref 150.0–400.0)
RBC: 4.57 Mil/uL (ref 3.87–5.11)
RDW: 14.5 % (ref 11.5–15.5)
WBC: 7.5 10*3/uL (ref 4.0–10.5)

## 2015-01-19 LAB — ALBUMIN: ALBUMIN: 3.9 g/dL (ref 3.5–5.2)

## 2015-01-19 LAB — BASIC METABOLIC PANEL
BUN: 27 mg/dL — ABNORMAL HIGH (ref 6–23)
CALCIUM: 9.5 mg/dL (ref 8.4–10.5)
CHLORIDE: 105 meq/L (ref 96–112)
CO2: 25 meq/L (ref 19–32)
Creatinine, Ser: 0.95 mg/dL (ref 0.40–1.20)
GFR: 61.1 mL/min (ref >60.00)
Glucose, Bld: 101 mg/dL — ABNORMAL HIGH (ref 70–99)
Potassium: 4.5 mEq/L (ref 3.5–5.1)
SODIUM: 138 meq/L (ref 135–145)

## 2015-01-19 LAB — TSH: TSH: 1.5 u[IU]/mL (ref 0.35–4.50)

## 2015-01-19 LAB — IBC PANEL
Iron: 31 ug/dL — ABNORMAL LOW (ref 42–145)
SATURATION RATIOS: 9.6 % — AB (ref 20.0–50.0)
Transferrin: 231 mg/dL (ref 212.0–360.0)

## 2015-01-19 LAB — VITAMIN D 25 HYDROXY (VIT D DEFICIENCY, FRACTURES): VITD: 61.39 ng/mL (ref 30.00–100.00)

## 2015-01-25 ENCOUNTER — Encounter: Payer: Self-pay | Admitting: Internal Medicine

## 2015-01-25 ENCOUNTER — Ambulatory Visit (INDEPENDENT_AMBULATORY_CARE_PROVIDER_SITE_OTHER): Payer: Medicare Other | Admitting: Internal Medicine

## 2015-01-25 VITALS — BP 138/88 | HR 72 | Wt 129.0 lb

## 2015-01-25 DIAGNOSIS — G43009 Migraine without aura, not intractable, without status migrainosus: Secondary | ICD-10-CM

## 2015-01-25 DIAGNOSIS — K439 Ventral hernia without obstruction or gangrene: Secondary | ICD-10-CM

## 2015-01-25 DIAGNOSIS — R1084 Generalized abdominal pain: Secondary | ICD-10-CM

## 2015-01-25 DIAGNOSIS — K5904 Chronic idiopathic constipation: Secondary | ICD-10-CM

## 2015-01-25 DIAGNOSIS — K219 Gastro-esophageal reflux disease without esophagitis: Secondary | ICD-10-CM

## 2015-01-25 DIAGNOSIS — D509 Iron deficiency anemia, unspecified: Secondary | ICD-10-CM

## 2015-01-25 NOTE — Assessment & Plan Note (Signed)
New abd wall hernia R>L Surgery in a few months

## 2015-01-25 NOTE — Assessment & Plan Note (Signed)
Infrequent

## 2015-01-25 NOTE — Assessment & Plan Note (Signed)
On Miralax

## 2015-01-25 NOTE — Assessment & Plan Note (Signed)
On Protonix 

## 2015-01-25 NOTE — Progress Notes (Signed)
Subjective:  Patient ID: Kristen Castillo, female    DOB: [birth year]  Age: 74 y.o. MRN: [id number]  CC: No chief complaint on file.   HPI Kristen Castillo presents for abd wall hernias. C/o GERD sx's. C/o LBP.  Outpatient Prescriptions Prior to Visit  Medication Sig Dispense Refill  . calcium carbonate (OS-CAL) 600 MG TABS tablet Take 600 mg by mouth 2 (two) times daily with a meal.    . Cholecalciferol 1000 UNITS tablet Take 2,000 Units by mouth 2 (two) times daily.     Marland Kitchen loratadine (CLARITIN) 10 MG tablet Take 1 tablet (10 mg total) by mouth daily. 100 tablet 3  . Multiple Vitamin (MULTIVITAMIN) tablet Take 1 tablet by mouth daily.    . ondansetron (ZOFRAN ODT) 4 MG disintegrating tablet Take 1 tablet (4 mg total) by mouth every 6 (six) hours as needed for nausea or vomiting. 30 tablet 0  . pantoprazole (PROTONIX) 40 MG tablet Take 1 tablet (40 mg total) by mouth daily. 30 tablet 6  . ranitidine (ZANTAC) 150 MG tablet Take 1 tablet (150 mg total) by mouth 2 (two) times daily. 60 tablet 5  . ALPRAZolam (XANAX) 0.25 MG tablet Take 1 tablet (0.25 mg total) by mouth 2 (two) times daily as needed for anxiety. (Patient not taking: Reported on 01/25/2015) 30 tablet 5  . Iron Polysacch Cmplx-B12-FA 150-0.025-1 MG CAPS Take 1 capsule by mouth daily. (Patient not taking: Reported on 01/25/2015) 90 each 1  . oxyCODONE (OXY IR/ROXICODONE) 5 MG immediate release tablet Take 1-2 tablets (5-10 mg total) by mouth every 4 (four) hours as needed for moderate pain, severe pain or breakthrough pain. (Patient not taking: Reported on 01/25/2015) 40 tablet 0   No facility-administered medications prior to visit.    ROS Review of Systems  Constitutional: Negative for chills, activity change, appetite change, fatigue and unexpected weight change.  HENT: Negative for congestion, mouth sores and sinus pressure.   Eyes: Negative for visual disturbance.  Respiratory: Negative for cough and chest tightness.     Gastrointestinal: Positive for abdominal pain and abdominal distention. Negative for nausea and blood in stool.  Genitourinary: Negative for frequency, difficulty urinating and vaginal pain.  Musculoskeletal: Negative for back pain and gait problem.  Skin: Negative for pallor and rash.  Neurological: Negative for dizziness, tremors, weakness, numbness and headaches.  Psychiatric/Behavioral: Negative for confusion and sleep disturbance.    Objective:  BP 138/88 mmHg  Pulse 72  Wt 129 lb (58.514 kg)  SpO2 97%  BP Readings from Last 3 Encounters:  01/25/15 138/88  12/05/14 130/80  11/15/14 145/73    Wt Readings from Last 3 Encounters:  01/25/15 129 lb (58.514 kg)  12/05/14 129 lb (58.514 kg)  11/08/14 136 lb (61.689 kg)    Physical Exam  Constitutional: She appears well-developed. No distress.  HENT:  Head: Normocephalic.  Right Ear: External ear normal.  Left Ear: External ear normal.  Nose: Nose normal.  Mouth/Throat: Oropharynx is clear and moist.  Eyes: Conjunctivae are normal. Pupils are equal, round, and reactive to light. Right eye exhibits no discharge. Left eye exhibits no discharge.  Neck: Normal range of motion. Neck supple. No JVD present. No tracheal deviation present. No thyromegaly present.  Cardiovascular: Normal rate, regular rhythm and normal heart sounds.   Pulmonary/Chest: No stridor. No respiratory distress. She has no wheezes.  Abdominal: Soft. Bowel sounds are normal. She exhibits mass. She exhibits no distension. There is no tenderness. There is no rebound  and no guarding.  Musculoskeletal: She exhibits no edema or tenderness.  Lymphadenopathy:    She has no cervical adenopathy.  Neurological: She displays normal reflexes. No cranial nerve deficit. She exhibits normal muscle tone. Coordination normal.  Skin: No rash noted. No erythema.  Psychiatric: She has a normal mood and affect. Her behavior is normal. Judgment and thought content normal.     Lab Results  Component Value Date   WBC 7.5 01/19/2015   HGB 13.0 01/19/2015   HCT 39.1 01/19/2015   PLT 333.0 01/19/2015   GLUCOSE 101* 01/19/2015   CHOL 186 04/14/2013   TRIG 193* 05/09/2014   HDL 51.90 04/14/2013   LDLCALC 104* 04/14/2013   ALT 8 01/19/2015   AST 14 01/19/2015   NA 138 01/19/2015   K 4.5 01/19/2015   CL 105 01/19/2015   CREATININE 0.95 01/19/2015   BUN 27* 01/19/2015   CO2 25 01/19/2015   TSH 1.50 01/19/2015   INR 1.03 11/03/2014   HGBA1C 6.0* 11/03/2014    No results found.  Assessment & Plan:   Diagnoses and all orders for this visit:  Generalized abdominal pain -     Basic metabolic panel; Future -     CBC with Differential/Platelet; Future  Hernia of abdominal wall  Functional constipation  Migraine without aura and without status migrainosus, not intractable  Gastroesophageal reflux disease without esophagitis  Anemia, iron deficiency -     Basic metabolic panel; Future -     CBC with Differential/Platelet; Future -     IBC panel; Future  I have discontinued Kristen Castillo's oxyCODONE. I am also having her maintain her Iron Polysacch Cmplx-B12-FA, Cholecalciferol, calcium carbonate, loratadine, ranitidine, multivitamin, ALPRAZolam, pantoprazole, and ondansetron.  No orders of the defined types were placed in this encounter.     Follow-up: Return in about 4 months (around 05/25/2015) for a follow-up visit.  Walker Kehr, MD

## 2015-01-25 NOTE — Progress Notes (Signed)
Pre visit review using our clinic review tool, if applicable. No additional management support is needed unless otherwise documented below in the visit note. 

## 2015-01-27 ENCOUNTER — Encounter: Payer: Self-pay | Admitting: Internal Medicine

## 2015-03-08 ENCOUNTER — Encounter: Payer: Self-pay | Admitting: Internal Medicine

## 2015-04-18 DIAGNOSIS — L814 Other melanin hyperpigmentation: Secondary | ICD-10-CM | POA: Diagnosis not present

## 2015-04-18 DIAGNOSIS — Z85828 Personal history of other malignant neoplasm of skin: Secondary | ICD-10-CM | POA: Diagnosis not present

## 2015-04-18 DIAGNOSIS — L821 Other seborrheic keratosis: Secondary | ICD-10-CM | POA: Diagnosis not present

## 2015-04-18 DIAGNOSIS — L720 Epidermal cyst: Secondary | ICD-10-CM | POA: Diagnosis not present

## 2015-04-18 DIAGNOSIS — D2361 Other benign neoplasm of skin of right upper limb, including shoulder: Secondary | ICD-10-CM | POA: Diagnosis not present

## 2015-05-01 DIAGNOSIS — H40832 Aqueous misdirection, left eye: Secondary | ICD-10-CM | POA: Diagnosis not present

## 2015-05-17 DIAGNOSIS — D1801 Hemangioma of skin and subcutaneous tissue: Secondary | ICD-10-CM | POA: Diagnosis not present

## 2015-05-17 DIAGNOSIS — L821 Other seborrheic keratosis: Secondary | ICD-10-CM | POA: Diagnosis not present

## 2015-05-17 DIAGNOSIS — Z85828 Personal history of other malignant neoplasm of skin: Secondary | ICD-10-CM | POA: Diagnosis not present

## 2015-05-19 ENCOUNTER — Other Ambulatory Visit (INDEPENDENT_AMBULATORY_CARE_PROVIDER_SITE_OTHER): Payer: Medicare Other

## 2015-05-19 DIAGNOSIS — N200 Calculus of kidney: Secondary | ICD-10-CM | POA: Diagnosis not present

## 2015-05-19 DIAGNOSIS — R1084 Generalized abdominal pain: Secondary | ICD-10-CM

## 2015-05-19 DIAGNOSIS — R3129 Other microscopic hematuria: Secondary | ICD-10-CM | POA: Diagnosis not present

## 2015-05-19 DIAGNOSIS — D509 Iron deficiency anemia, unspecified: Secondary | ICD-10-CM

## 2015-05-19 LAB — CBC WITH DIFFERENTIAL/PLATELET
BASOS PCT: 1.2 % (ref 0.0–3.0)
Basophils Absolute: 0.1 10*3/uL (ref 0.0–0.1)
EOS PCT: 1.7 % (ref 0.0–5.0)
Eosinophils Absolute: 0.1 10*3/uL (ref 0.0–0.7)
HEMATOCRIT: 40.7 % (ref 36.0–46.0)
HEMOGLOBIN: 13.9 g/dL (ref 12.0–15.0)
Lymphocytes Relative: 24.5 % (ref 12.0–46.0)
Lymphs Abs: 2 10*3/uL (ref 0.7–4.0)
MCHC: 34 g/dL (ref 30.0–36.0)
MCV: 86 fl (ref 78.0–100.0)
MONO ABS: 0.6 10*3/uL (ref 0.1–1.0)
Monocytes Relative: 7.5 % (ref 3.0–12.0)
NEUTROS ABS: 5.3 10*3/uL (ref 1.4–7.7)
Neutrophils Relative %: 65.1 % (ref 43.0–77.0)
PLATELETS: 297 10*3/uL (ref 150.0–400.0)
RBC: 4.73 Mil/uL (ref 3.87–5.11)
RDW: 15.1 % (ref 11.5–15.5)
WBC: 8.2 10*3/uL (ref 4.0–10.5)

## 2015-05-19 LAB — BASIC METABOLIC PANEL
BUN: 29 mg/dL — AB (ref 6–23)
CHLORIDE: 105 meq/L (ref 96–112)
CO2: 27 meq/L (ref 19–32)
CREATININE: 0.95 mg/dL (ref 0.40–1.20)
Calcium: 9.9 mg/dL (ref 8.4–10.5)
GFR: 61.04 mL/min (ref >60.00)
Glucose, Bld: 99 mg/dL (ref 70–99)
POTASSIUM: 4.6 meq/L (ref 3.5–5.1)
Sodium: 140 mEq/L (ref 135–145)

## 2015-05-19 LAB — IBC PANEL
IRON: 44 ug/dL (ref 42–145)
Saturation Ratios: 12.7 % — ABNORMAL LOW (ref 20.0–50.0)
Transferrin: 248 mg/dL (ref 212.0–360.0)

## 2015-05-23 ENCOUNTER — Ambulatory Visit: Payer: Self-pay | Admitting: General Surgery

## 2015-05-23 DIAGNOSIS — K432 Incisional hernia without obstruction or gangrene: Secondary | ICD-10-CM | POA: Diagnosis not present

## 2015-05-23 NOTE — H&P (Signed)
Kristen Castillo. Kristen Castillo 05/23/2015 11:00 AM Location: [facility] Patient #: [id number] DOB: 18-Jun-1940 Married / Language: [name] / Race: White Female  History of Present Illness [name]; 05/23/2015 11:43 AM) The patient is a 75 year old female.   Note:She is here for follow-up of her ventral incisional hernia. She developed some new swelling at the previous left colostomy site. This was reduced by a nurse friend of her this weekend. She had some malaise and nausea all weekend. She has some discomfort from the left-sided hernia which is better. She is noted enlargement of the right-sided hernia and has noted one in the right suprapubic area as well.  Her past medical history is notable for complicated diverticulitis, gastroesophageal reflux disease, nephrolithiasis, migraine headaches, osteoporosis, mild anxiety.  Allergies Elbert Ewings, CMA; 05/23/2015 11:00 AM) Sulfacetamide *CHEMICALS* Codeine Phosphate *ANALGESICS - OPIOID*  Medication History Elbert Ewings, CMA; 05/23/2015 11:00 AM) Calcium Gluconate (600MG  Tablet, 1 Oral bid) Active. Colace (100MG  Capsule, 2 Oral bid) Active. Delsym Cough/Cold Daytime (5-10-200-325MG /10ML Liquid, Oral prn allergies) Active. Saline Nasal Spray (Nasal prn) Active. Multiple Vitamin (1 (one) Oral daily) Active. Vitamin D (2000UNIT Tablet, 1 (one) Oral two times daily) Active. Medications Reconciled     Review of Systems [name]; 05/23/2015 11:38 AM)  Note: Some nausea this past weekend but no vomiting. The symptoms are better.   Vitals Elbert Ewings CMA; 05/23/2015 11:01 AM) 05/23/2015 11:00 AM Weight: 119.2 lb Height: 62.5in Body Surface Area: 1.54 m Body Mass Index: 21.45 kg/m  Temp.: 98.30F(Temporal)  Pulse: 118 (Regular)  BP: 124/74 (Sitting, Left Arm, Standard)      Physical Exam [name]; 05/23/2015 11:40 AM)  The physical exam findings are as  follows: Note:General: WDWN in NAD. Pleasant and cooperative.  HEENT: [place]/AT, no facial lesions  EYES: Wears glasses, no icterus  CV: RRR, no murmur, no JVD.  CHEST: Breath sounds equal and clear. Respirations nonlabored.  ABDOMEN: Soft, lower midline scar with reducible hernia to the right of the umbilical area, in the midportion, and just to the right of the inferior portion. Left transverse scar with reducible bulge. All areas have a palpable fascial defect.  SKIN: No jaundice.  NEUROLOGIC: Alert and oriented, answers questions appropriately, normal gait and station.  PSYCHIATRIC: Normal mood, affect , and behavior.    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 I recommended laparoscopic possible open repair with mesh. She is in agreement with this.  Plan: Laparoscopic possible open repair of ventral incisional hernias with mesh. I have discussed the procedure, risks, and aftercare. Risks include but are not limited to bleeding, infection, wound healing problems, anesthesia, recurrence, accidental injury to intra-abdominal organs-such as intestine, liver, spleen, bladder, etc, chronic pain, abdominal wall laxity. We also discussed the rare complication of mesh rejection. All questions were answered.  [name], MD

## 2015-05-25 ENCOUNTER — Encounter: Payer: Self-pay | Admitting: Internal Medicine

## 2015-05-25 ENCOUNTER — Ambulatory Visit (INDEPENDENT_AMBULATORY_CARE_PROVIDER_SITE_OTHER): Payer: Medicare Other | Admitting: Internal Medicine

## 2015-05-25 VITALS — BP 98/70 | HR 71 | Wt 120.0 lb

## 2015-05-25 DIAGNOSIS — R1084 Generalized abdominal pain: Secondary | ICD-10-CM

## 2015-05-25 DIAGNOSIS — E441 Mild protein-calorie malnutrition: Secondary | ICD-10-CM

## 2015-05-25 DIAGNOSIS — K439 Ventral hernia without obstruction or gangrene: Secondary | ICD-10-CM

## 2015-05-25 DIAGNOSIS — E559 Vitamin D deficiency, unspecified: Secondary | ICD-10-CM

## 2015-05-25 NOTE — Assessment & Plan Note (Signed)
Doing well 

## 2015-05-25 NOTE — Assessment & Plan Note (Signed)
Due to B hernias Surgery is being planned Labs prior to surgery

## 2015-05-25 NOTE — Assessment & Plan Note (Signed)
On Vit D 

## 2015-05-25 NOTE — Assessment & Plan Note (Signed)
Large abd wall hernia R>L Surgery in a few wks

## 2015-05-25 NOTE — Progress Notes (Signed)
Subjective:  Patient ID: Kristen Castillo, female    DOB: 03-Nov-1940  Age: 75 y.o. MRN: [id number]  CC: No chief complaint on file.   HPI Kristen Castillo presents for hernias, blood in UA (Dr Risa Grill), GERD f/u. Pt lost 10 lbs on diet  Outpatient Prescriptions Prior to Visit  Medication Sig Dispense Refill  . calcium carbonate (OS-CAL) 600 MG TABS tablet Take 600 mg by mouth 2 (two) times daily with a meal.    . Cholecalciferol 1000 UNITS tablet Take 2,000 Units by mouth 2 (two) times daily.     . Multiple Vitamin (MULTIVITAMIN) tablet Take 1 tablet by mouth daily.    . ondansetron (ZOFRAN ODT) 4 MG disintegrating tablet Take 1 tablet (4 mg total) by mouth every 6 (six) hours as needed for nausea or vomiting. 30 tablet 0  . pantoprazole (PROTONIX) 40 MG tablet Take 1 tablet (40 mg total) by mouth daily. 30 tablet 6  . ALPRAZolam (XANAX) 0.25 MG tablet Take 1 tablet (0.25 mg total) by mouth 2 (two) times daily as needed for anxiety. (Patient not taking: Reported on 05/25/2015) 30 tablet 5  . Iron Polysacch Cmplx-B12-FA 150-0.025-1 MG CAPS Take 1 capsule by mouth daily. (Patient not taking: Reported on 05/25/2015) 90 each 1  . loratadine (CLARITIN) 10 MG tablet Take 1 tablet (10 mg total) by mouth daily. (Patient not taking: Reported on 05/25/2015) 100 tablet 3  . ranitidine (ZANTAC) 150 MG tablet Take 1 tablet (150 mg total) by mouth 2 (two) times daily. (Patient not taking: Reported on 05/25/2015) 60 tablet 5   No facility-administered medications prior to visit.    ROS Review of Systems  Constitutional: Negative for chills, activity change, appetite change, fatigue and unexpected weight change.  HENT: Negative for congestion, mouth sores and sinus pressure.   Eyes: Negative for visual disturbance.  Respiratory: Negative for cough and chest tightness.   Gastrointestinal: Negative for nausea, abdominal pain and diarrhea.  Genitourinary: Negative for frequency, difficulty urinating and  vaginal pain.  Musculoskeletal: Negative for back pain and gait problem.  Skin: Negative for pallor and rash.  Neurological: Negative for dizziness, tremors, weakness, numbness and headaches.  Psychiatric/Behavioral: Negative for suicidal ideas, confusion and sleep disturbance.    Objective:  BP 98/70 mmHg  Pulse 71  Wt 120 lb (54.432 kg)  SpO2 96%  BP Readings from Last 3 Encounters:  05/25/15 98/70  01/25/15 138/88  12/05/14 130/80    Wt Readings from Last 3 Encounters:  05/25/15 120 lb (54.432 kg)  01/25/15 129 lb (58.514 kg)  12/05/14 129 lb (58.514 kg)    Physical Exam  Constitutional: She appears well-developed. No distress.  HENT:  Head: Normocephalic.  Right Ear: External ear normal.  Left Ear: External ear normal.  Nose: Nose normal.  Mouth/Throat: Oropharynx is clear and moist.  Eyes: Conjunctivae are normal. Pupils are equal, round, and reactive to light. Right eye exhibits no discharge. Left eye exhibits no discharge.  Neck: Normal range of motion. Neck supple. No JVD present. No tracheal deviation present. No thyromegaly present.  Cardiovascular: Normal rate, regular rhythm and normal heart sounds.   Pulmonary/Chest: No stridor. No respiratory distress. She has no wheezes.  Abdominal: Soft. Bowel sounds are normal. She exhibits no distension and no mass. There is no tenderness. There is no rebound and no guarding.  Musculoskeletal: She exhibits no edema or tenderness.  Lymphadenopathy:    She has no cervical adenopathy.  Neurological: She displays normal reflexes. No cranial  nerve deficit. She exhibits normal muscle tone. Coordination normal.  Skin: No rash noted. No erythema.  Psychiatric: She has a normal mood and affect. Her behavior is normal. Judgment and thought content normal.   Large B abd wall hernias Lab Results  Component Value Date   WBC 8.2 05/19/2015   HGB 13.9 05/19/2015   HCT 40.7 05/19/2015   PLT 297.0 05/19/2015   GLUCOSE 99  05/19/2015   CHOL 186 04/14/2013   TRIG 193* 05/09/2014   HDL 51.90 04/14/2013   LDLCALC 104* 04/14/2013   ALT 8 01/19/2015   AST 14 01/19/2015   NA 140 05/19/2015   K 4.6 05/19/2015   CL 105 05/19/2015   CREATININE 0.95 05/19/2015   BUN 29* 05/19/2015   CO2 27 05/19/2015   TSH 1.50 01/19/2015   INR 1.03 11/03/2014   HGBA1C 6.0* 11/03/2014    No results found.  Assessment & Plan:   There are no diagnoses linked to this encounter. I am having [name] maintain her Iron Polysacch Cmplx-B12-FA, Cholecalciferol, calcium carbonate, loratadine, ranitidine, multivitamin, ALPRAZolam, pantoprazole, and ondansetron.  No orders of the defined types were placed in this encounter.     Follow-up: No Follow-up on file.  Walker Kehr, MD

## 2015-05-25 NOTE — Progress Notes (Signed)
Pre visit review using our clinic review tool, if applicable. No additional management support is needed unless otherwise documented below in the visit note. 

## 2015-05-31 DIAGNOSIS — H40832 Aqueous misdirection, left eye: Secondary | ICD-10-CM | POA: Diagnosis not present

## 2015-06-05 DIAGNOSIS — H10503 Unspecified blepharoconjunctivitis, bilateral: Secondary | ICD-10-CM | POA: Diagnosis not present

## 2015-06-09 DIAGNOSIS — H10503 Unspecified blepharoconjunctivitis, bilateral: Secondary | ICD-10-CM | POA: Diagnosis not present

## 2015-06-14 ENCOUNTER — Encounter: Payer: Self-pay | Admitting: Internal Medicine

## 2015-06-14 DIAGNOSIS — H40832 Aqueous misdirection, left eye: Secondary | ICD-10-CM | POA: Diagnosis not present

## 2015-06-14 DIAGNOSIS — R030 Elevated blood-pressure reading, without diagnosis of hypertension: Secondary | ICD-10-CM | POA: Diagnosis not present

## 2015-06-14 DIAGNOSIS — J029 Acute pharyngitis, unspecified: Secondary | ICD-10-CM | POA: Diagnosis not present

## 2015-06-14 DIAGNOSIS — H40031 Anatomical narrow angle, right eye: Secondary | ICD-10-CM | POA: Diagnosis not present

## 2015-06-14 DIAGNOSIS — Z961 Presence of intraocular lens: Secondary | ICD-10-CM | POA: Diagnosis not present

## 2015-06-15 NOTE — Telephone Encounter (Signed)
Has called back in regards.  I have scheduled for 4/6 with Lorane Gell

## 2015-06-16 ENCOUNTER — Encounter: Payer: Self-pay | Admitting: Nurse Practitioner

## 2015-06-16 ENCOUNTER — Ambulatory Visit (INDEPENDENT_AMBULATORY_CARE_PROVIDER_SITE_OTHER): Payer: Medicare Other | Admitting: Nurse Practitioner

## 2015-06-16 ENCOUNTER — Ambulatory Visit (INDEPENDENT_AMBULATORY_CARE_PROVIDER_SITE_OTHER)
Admission: RE | Admit: 2015-06-16 | Discharge: 2015-06-16 | Disposition: A | Discharge status: Home / Self Care | Payer: Medicare Other | Source: Ambulatory Visit | Attending: Nurse Practitioner | Admitting: Nurse Practitioner

## 2015-06-16 VITALS — BP 110/80 | HR 76 | Temp 97.7°F | Ht 62.0 in | Wt 123.8 lb

## 2015-06-16 DIAGNOSIS — R05 Cough: Secondary | ICD-10-CM

## 2015-06-16 DIAGNOSIS — Z20818 Contact with and (suspected) exposure to other bacterial communicable diseases: Secondary | ICD-10-CM

## 2015-06-16 DIAGNOSIS — J309 Allergic rhinitis, unspecified: Secondary | ICD-10-CM

## 2015-06-16 DIAGNOSIS — R059 Cough, unspecified: Secondary | ICD-10-CM

## 2015-06-16 DIAGNOSIS — Z2089 Contact with and (suspected) exposure to other communicable diseases: Secondary | ICD-10-CM | POA: Diagnosis not present

## 2015-06-16 LAB — POCT RAPID STREP A (OFFICE): RAPID STREP A SCREEN: NEGATIVE

## 2015-06-16 NOTE — Progress Notes (Signed)
Patient ID: Kristen Castillo, female    DOB: 07-Oct-1940  Age: 75 y.o. MRN: [id number]  CC: Other   HPI Kristen Castillo presents for CC of sore throat x 2-3 days.   1) Surgery on 07/04/15 with Dr. Zella Richer  Pt is very anxious about being exposed to strep.  Rapid was not performed, but culture was obtained Wednesday at [facility]. She does not want to have to put off the surgery. She was given Amoxicillin 875 mg and has it, but has not started it. Culture results should be returned to pt Saturday she reports. She has had a cough, mild amount of drainage and her throat is scratchy she reports.   History Kristen Castillo has a past medical history of Hematuria; Hair loss; Unspecified vitamin D deficiency; Migraines; Osteoporosis; Osteoarthritis; History of creation of ostomy; Complication of anesthesia; PONV (postoperative nausea and vomiting); Pneumonia; GERD (gastroesophageal reflux disease); Diverticulitis; Kidney cysts; and Redness.   She has past surgical history that includes Abdominal hysterectomy (1972); Oophorectomy (1976); Tonsillectomy; Lipoma resection; Reduction mammaplasty (1994); Rhinoplasty (1985); Facial cosmetic surgery (2009); Laparoscopic partial colectomy (N/A, 05/03/2014); laparotomy (N/A, 05/03/2014); Ileo loop colostomy closure (N/A, 11/08/2014); and Laparoscopic lysis of adhesions (N/A, 11/08/2014).   Her family history includes Arthritis in her mother; Cancer in her maternal grandfather; Colon cancer (age of onset: 44) in her mother; Dementia in her father; Diabetes in her sister; Heart disease in her father. There is no history of Stomach cancer.She reports that she has never smoked. She has never used smokeless tobacco. She reports that she does not drink alcohol or use illicit drugs.  Outpatient Prescriptions Prior to Visit  Medication Sig Dispense Refill  . calcium carbonate (OS-CAL) 600 MG TABS tablet Take 600 mg by mouth 2 (two) times daily with a meal. Reported on 06/16/2015    .  Cholecalciferol 1000 UNITS tablet Take 2,000 Units by mouth 2 (two) times daily.     . diazepam (VALIUM) 5 MG tablet Take 2.5 mg by mouth every 6 (six) hours as needed for anxiety.    . Multiple Vitamin (MULTIVITAMIN) tablet Take 1 tablet by mouth daily.    . ondansetron (ZOFRAN ODT) 4 MG disintegrating tablet Take 1 tablet (4 mg total) by mouth every 6 (six) hours as needed for nausea or vomiting. 30 tablet 0  . pantoprazole (PROTONIX) 40 MG tablet Take 1 tablet (40 mg total) by mouth daily. 30 tablet 6  . ALPRAZolam (XANAX) 0.25 MG tablet Take 1 tablet (0.25 mg total) by mouth 2 (two) times daily as needed for anxiety. 30 tablet 5  . Iron Polysacch Cmplx-B12-FA 150-0.025-1 MG CAPS Take 1 capsule by mouth daily. 90 each 1  . loratadine (CLARITIN) 10 MG tablet Take 1 tablet (10 mg total) by mouth daily. 100 tablet 3  . polyethylene glycol (MIRALAX / GLYCOLAX) packet Take 17 g by mouth daily. Reported on 06/16/2015     No facility-administered medications prior to visit.    ROS Review of Systems  Constitutional: Negative for fever, chills, diaphoresis and fatigue.  HENT: Positive for congestion, postnasal drip and sore throat. Negative for ear pain, rhinorrhea, sinus pressure and sneezing.   Respiratory: Positive for cough. Negative for chest tightness, shortness of breath and wheezing.   Cardiovascular: Negative for chest pain, palpitations and leg swelling.  Gastrointestinal: Negative for nausea, vomiting and diarrhea.  Skin: Negative for rash.  Neurological: Negative for dizziness and headaches.    Objective:  BP 110/80 mmHg  Pulse 76  Temp(Src) 97.7  F (36.5 C) (Oral)  Ht 5\' 2"  (1.575 m)  Wt 123 lb 12 oz (56.133 kg)  BMI 22.63 kg/m2  SpO2 95%  Physical Exam  Constitutional: She is oriented to person, place, and time. She appears well-developed and well-nourished. No distress.  HENT:  Head: Normocephalic and atraumatic.  Right Ear: External ear normal.  Left Ear: External ear  normal.  Mouth/Throat: Oropharynx is clear and moist. No oropharyngeal exudate.  TMs clear bilaterally  Eyes: EOM are normal. Pupils are equal, round, and reactive to light. Right eye exhibits no discharge. Left eye exhibits no discharge. No scleral icterus.  Neck: Normal range of motion. Neck supple.  Cardiovascular: Normal rate and regular rhythm.   Pulmonary/Chest: Effort normal. No respiratory distress. She has no wheezes. She has no rales. She exhibits no tenderness.  Rhonchi on right lower side was cleared with cough, slight decrease in sounds compared with left  Lymphadenopathy:    She has no cervical adenopathy.  Neurological: She is alert and oriented to person, place, and time.  Skin: Skin is warm and dry. No rash noted. She is not diaphoretic.  Psychiatric: She has a normal mood and affect. Her behavior is normal. Judgment and thought content normal.   Assessment & Plan:   Kristen Castillo was seen today for other.  Diagnoses and all orders for this visit:  Exposure to strep throat -     POCT rapid strep A  Cough -     DG Chest 2 View; Future  Allergic rhinitis, unspecified allergic rhinitis type   I have discontinued Kristen Castillo's Iron Polysacch Cmplx-B12-FA, loratadine, ALPRAZolam, and polyethylene glycol. I am also having her maintain her Cholecalciferol, calcium carbonate, multivitamin, pantoprazole, ondansetron, and diazepam.  No orders of the defined types were placed in this encounter.     Follow-up: Return if symptoms worsen or fail to improve.

## 2015-06-16 NOTE — Patient Instructions (Signed)
Please visit the basement for x-ray and we will contact you about results.

## 2015-06-16 NOTE — Progress Notes (Signed)
Pre visit review using our clinic review tool, if applicable. No additional management support is needed unless otherwise documented below in the visit note. 

## 2015-06-16 NOTE — Assessment & Plan Note (Signed)
Likely just allergic rhinitis POCT rapid strep was negative We'll obtain chest x-ray today due to h/o CAP Hold ABX until culture results return- highly doubt strep due to lack of symptoms.  FU prn worsening/failure to improve.

## 2015-06-23 ENCOUNTER — Encounter: Payer: Self-pay | Admitting: Nurse Practitioner

## 2015-06-26 ENCOUNTER — Ambulatory Visit: Payer: Medicare Other | Admitting: Nurse Practitioner

## 2015-06-27 ENCOUNTER — Ambulatory Visit (INDEPENDENT_AMBULATORY_CARE_PROVIDER_SITE_OTHER): Payer: Medicare Other | Admitting: Internal Medicine

## 2015-06-27 ENCOUNTER — Encounter: Payer: Self-pay | Admitting: Internal Medicine

## 2015-06-27 VITALS — BP 110/70 | HR 68 | Temp 97.8°F | Resp 20 | Wt 122.0 lb

## 2015-06-27 DIAGNOSIS — F419 Anxiety disorder, unspecified: Secondary | ICD-10-CM

## 2015-06-27 DIAGNOSIS — J309 Allergic rhinitis, unspecified: Secondary | ICD-10-CM

## 2015-06-27 DIAGNOSIS — J069 Acute upper respiratory infection, unspecified: Secondary | ICD-10-CM | POA: Diagnosis not present

## 2015-06-27 MED ORDER — PREDNISONE 10 MG PO TABS: ORAL_TABLET | ORAL | Status: DC

## 2015-06-27 MED ORDER — AZITHROMYCIN 250 MG PO TABS: ORAL_TABLET | ORAL | Status: DC

## 2015-06-27 NOTE — Progress Notes (Signed)
Subjective:    Patient ID: Kristen Castillo, female    DOB: [birth year], 75 y.o.   MRN: [id number]  HPI    Here with 2-3 days ears clogged, ? Fever, but Does have several wks ongoing nasal allergy symptoms with clearish congestion, itch and sneezing, without fever, pain, ST, cough, swelling or wheezing. Pt denies chest pain, increased sob or doe, wheezing, orthopnea, PND, increased LE swelling, palpitations, dizziness or syncope.  Pt denies new neurological symptoms such as new headache, or facial or extremity weakness or numbness   Pt denies polydipsia, polyuria.  Due for multi-abd wall hernia repair next wk. Past Medical History  Diagnosis Date  . Hematuria     in past  . Hair loss   . Unspecified vitamin D deficiency   . Migraines     in past  . Osteoporosis   . Osteoarthritis   . History of creation of ostomy     past partial colectomy in 05/03/14  . Complication of anesthesia   . PONV (postoperative nausea and vomiting)   . Pneumonia     july 2016  . GERD (gastroesophageal reflux disease)   . Diverticulitis   . Kidney cysts   . Redness     small area of reddness left lower leg x 2 days slight tenderness with palpation   Past Surgical History  Procedure Laterality Date  . Abdominal hysterectomy  1972  . Oophorectomy  1976  . Tonsillectomy    . Lipoma resection      left scapula area  . Reduction mammaplasty  1994  . Rhinoplasty  1985  . Facial cosmetic surgery  2009    [name] MD in [place]  . Laparoscopic partial colectomy N/A 05/03/2014    Procedure: DIAGNOSTIC LAPAROSCOPY WITH DRAINAGE OF INTRA ABDOMINAL ABSCESS PARTIAL COLECTOMY ;  Surgeon: [name], MD;  Location: [facility];  Service: General;  Laterality: N/A;  supine position  . Laparotomy N/A 05/03/2014    Procedure: EXPLORATORY LAPAROTOMY WITH HARTMAN PROCEDURE;  Surgeon: [name], MD;  Location: [facility];  Service: General;  Laterality: N/A;  . Ileo loop colostomy closure N/A 11/08/2014    Procedure:  LAPAROSCOPIC COLOSTOMY CLOSURE WITH PARTIAL COLECTOMY;  Surgeon: [name], MD;  Location: [facility];  Service: General;  Laterality: N/A;  . Laparoscopic lysis of adhesions N/A 11/08/2014    Procedure: LAPAROSCOPIC LYSIS OF ADHESIONS FOR 90 MINUTES;  Surgeon: [name], MD;  Location: [facility];  Service: General;  Laterality: N/A;    reports that she has never smoked. She has never used smokeless tobacco. She reports that she does not drink alcohol or use illicit drugs. family history includes Arthritis in her mother; Cancer in her maternal grandfather; Colon cancer (age of onset: 69) in her mother; Dementia in her father; Diabetes in her sister; Heart disease in her father. There is no history of Stomach cancer. Allergies  Allergen Reactions  . Codeine Nausea And Vomiting  . Sulfonamide Derivatives Other (See Comments)    Childhood allergy; reaction unknown  . Tape Other (See Comments)    REDDNESS, BLISTER/  Use paper tape   Current Outpatient Prescriptions on File Prior to Visit  Medication Sig Dispense Refill  . calcium carbonate (OS-CAL) 600 MG TABS tablet Take 600 mg by mouth 2 (two) times daily with a meal. Reported on 06/16/2015    . Cholecalciferol 1000 UNITS tablet Take 2,000 Units by mouth 2 (two) times daily.     . diazepam (VALIUM) 5 MG  tablet Take 2.5 mg by mouth every 6 (six) hours as needed for anxiety.    . Multiple Vitamin (MULTIVITAMIN) tablet Take 1 tablet by mouth daily.    . ondansetron (ZOFRAN ODT) 4 MG disintegrating tablet Take 1 tablet (4 mg total) by mouth every 6 (six) hours as needed for nausea or vomiting. 30 tablet 0  . pantoprazole (PROTONIX) 40 MG tablet Take 1 tablet (40 mg total) by mouth daily. 30 tablet 6   No current facility-administered medications on file prior to visit.   Review of Systems  Constitutional: Negative for unusual diaphoresis or night sweats HENT: Negative for ear swelling or discharge Eyes: Negative for worsening visual haziness    Respiratory: Negative for choking and stridor.   Gastrointestinal: Negative for distension or worsening eructation Genitourinary: Negative for retention or change in urine volume.  Musculoskeletal: Negative for other MSK pain or swelling Skin: Negative for color change and worsening wound Neurological: Negative for tremors and numbness other than noted  Psychiatric/Behavioral: Negative for decreased concentration or agitation other than above       Objective:   Physical Exam BP 110/70 mmHg  Pulse 68  Temp(Src) 97.8 F (36.6 C) (Oral)  Resp 20  Wt 122 lb (55.339 kg)  SpO2 95% VS noted, non toxic Constitutional: Pt appears in no apparent distress HENT: Head: NCAT.  Right Ear: External ear normal.  Left Ear: External ear normal.  Bilat tm's with mild erythema.  Max sinus areas non tender.  Pharynx with mild erythema, no exudate Eyes: . Pupils are equal, round, and reactive to light. Conjunctivae and EOM are normal Neck: Normal range of motion. Neck supple.  Cardiovascular: Normal rate and regular rhythm.   Pulmonary/Chest: Effort normal and breath sounds without rales or wheezing.  Abd:  Soft, NT, ND, + BS with multi hernias reducible Neurological: Pt is alert. Not confused , motor grossly intact Skin: Skin is warm. No rash, no LE edema Psychiatric: Pt behavior is normal. No agitation. 1-2= nervous    Assessment & Plan:

## 2015-06-27 NOTE — Assessment & Plan Note (Signed)
Mild to mod seasonal flare, for depomedrol IM, predpac course,  Anithist/nasal steroid, to f/u any worsening symptoms or concerns

## 2015-06-27 NOTE — Assessment & Plan Note (Signed)
Mild to mod, for antibx course,  to f/u any worsening symptoms or concerns 

## 2015-06-27 NOTE — Patient Instructions (Addendum)
You had the steroid shot today  Please take all new medication as prescribed - the prednisone, and the antibiotic  You can also take Mucinex D (or it's generic off brand) for congestion, and tylenol as needed for pain.  Please continue all other medications as before, including the claritin and flonase  Please have the pharmacy call with any other refills you may need.  Please keep your appointments with your specialists as you may have planned

## 2015-06-27 NOTE — Assessment & Plan Note (Signed)
Mild, situational worse today, reassured, cont same tx

## 2015-06-27 NOTE — Progress Notes (Signed)
Pre visit review using our clinic review tool, if applicable. No additional management support is needed unless otherwise documented below in the visit note. 

## 2015-06-28 NOTE — Patient Instructions (Addendum)
YOUR PROCEDURE IS SCHEDULED ON :  07/04/15  REPORT TO [facility] MAIN ENTRANCE FOLLOW SIGNS TO EAST ELEVATOR - GO TO 3rd FLOOR CHECK IN AT 3 EAST NURSES STATION (SHORT STAY) AT:  5:30 AM  CALL THIS NUMBER IF YOU HAVE PROBLEMS THE MORNING OF SURGERY [phone]  REMEMBER:ONLY 1 PER PERSON MAY GO TO SHORT STAY WITH YOU TO GET READY THE MORNING OF YOUR SURGERY  DO NOT EAT FOOD OR DRINK LIQUIDS AFTER MIDNIGHT  TAKE THESE MEDICINES THE MORNING OF SURGERY: PROTONIX / MAY TAKE VALIUM IF NEEDED  STOP ASPIRIN / IBUPROFEN / ALEVE / VITAMINS / HERBAL MEDS __7__ DAYS BEFORE SURGERY  YOU MAY NOT HAVE ANY METAL ON YOUR BODY INCLUDING HAIR PINS AND PIERCING'S. DO NOT WEAR JEWELRY, MAKEUP, LOTIONS, POWDERS OR PERFUMES. DO NOT WEAR NAIL POLISH. DO NOT SHAVE 48 HRS PRIOR TO SURGERY. MEN MAY SHAVE FACE AND NECK.  DO NOT [facility]. [facility] IS NOT RESPONSIBLE FOR VALUABLES.  CONTACTS, DENTURES OR PARTIALS MAY NOT BE WORN TO SURGERY. LEAVE SUITCASE IN CAR. CAN BE BROUGHT TO ROOM AFTER SURGERY.  PATIENTS DISCHARGED THE DAY OF SURGERY WILL NOT BE ALLOWED TO DRIVE HOME.  PLEASE READ OVER THE FOLLOWING INSTRUCTION SHEETS _________________________________________________________________________________                                          [facility] - PREPARING FOR SURGERY  Before surgery, you can play an important role.  Because skin is not sterile, your skin needs to be as free of germs as possible.  You can reduce the number of germs on your skin by washing with CHG (chlorahexidine gluconate) soap before surgery.  CHG is an antiseptic cleaner which kills germs and bonds with the skin to continue killing germs even after washing. Please DO NOT use if you have an allergy to CHG or antibacterial soaps.  If your skin becomes reddened/irritated stop using the CHG and inform your nurse when you arrive at Short Stay. Do not shave (including legs and underarms) for at  least 48 hours prior to the first CHG shower.  You may shave your face. Please follow these instructions carefully:   1.  Shower with CHG Soap the night before surgery and the  morning of Surgery.   2.  If you choose to wash your hair, wash your hair first as usual with your  normal  Shampoo.   3.  After you shampoo, rinse your hair and body thoroughly to remove the  shampoo.                                         4.  Use CHG as you would any other liquid soap.  You can apply chg directly  to the skin and wash . Gently wash with scrungie or clean wascloth    5.  Apply the CHG Soap to your body ONLY FROM THE NECK DOWN.   Do not use on open                           Wound or open sores. Avoid contact with eyes, ears mouth and genitals (private parts).  Genitals (private parts) with your normal soap.              6.  Wash thoroughly, paying special attention to the area where your surgery  will be performed.   7.  Thoroughly rinse your body with warm water from the neck down.   8.  DO NOT shower/wash with your normal soap after using and rinsing off  the CHG Soap .                9.  Pat yourself dry with a clean towel.             10.  Wear clean night clothes to bed after shower             11.  Place clean sheets on your bed the night of your first shower and do not  sleep with pets.  Day of Surgery : Do not apply any lotions/deodorants the morning of surgery.  Please wear clean clothes to the hospital/surgery center.  FAILURE TO FOLLOW THESE INSTRUCTIONS MAY RESULT IN THE CANCELLATION OF YOUR SURGERY    PATIENT SIGNATURE_________________________________  ______________________________________________________________________

## 2015-06-29 ENCOUNTER — Encounter (HOSPITAL_COMMUNITY)
Admission: RE | Admit: 2015-06-29 | Discharge: 2015-06-29 | Disposition: A | Discharge status: Home / Self Care | Payer: Medicare Other | Source: Ambulatory Visit | Attending: General Surgery | Admitting: General Surgery

## 2015-06-29 ENCOUNTER — Ambulatory Visit (HOSPITAL_COMMUNITY)
Admission: RE | Admit: 2015-06-29 | Discharge: 2015-06-29 | Disposition: A | Discharge status: Home / Self Care | Payer: Medicare Other | Source: Ambulatory Visit | Attending: Anesthesiology | Admitting: Anesthesiology

## 2015-06-29 ENCOUNTER — Encounter (HOSPITAL_COMMUNITY): Payer: Self-pay

## 2015-06-29 DIAGNOSIS — R053 Chronic cough: Secondary | ICD-10-CM

## 2015-06-29 DIAGNOSIS — R918 Other nonspecific abnormal finding of lung field: Secondary | ICD-10-CM | POA: Insufficient documentation

## 2015-06-29 DIAGNOSIS — R05 Cough: Secondary | ICD-10-CM | POA: Diagnosis not present

## 2015-06-29 HISTORY — DX: Deviated nasal septum: J34.2

## 2015-06-29 HISTORY — DX: Ventral hernia without obstruction or gangrene: K43.9

## 2015-06-29 HISTORY — DX: Malignant (primary) neoplasm, unspecified: C80.1

## 2015-06-29 HISTORY — DX: Cough: R05

## 2015-06-29 HISTORY — DX: Personal history of other malignant neoplasm of skin: Z85.828

## 2015-06-29 HISTORY — DX: Chronic cough: R05.3

## 2015-06-29 HISTORY — DX: Anxiety disorder, unspecified: F41.9

## 2015-06-29 HISTORY — DX: Other specified disorders of ear, bilateral: H93.8X3

## 2015-06-29 LAB — CBC WITH DIFFERENTIAL/PLATELET
BASOS PCT: 0 %
Basophils Absolute: 0 10*3/uL (ref 0.0–0.1)
Eosinophils Absolute: 0 10*3/uL (ref 0.0–0.7)
Eosinophils Relative: 0 %
HEMATOCRIT: 40.5 % (ref 36.0–46.0)
Hemoglobin: 13.6 g/dL (ref 12.0–15.0)
Lymphocytes Relative: 8 %
Lymphs Abs: 1.3 10*3/uL (ref 0.7–4.0)
MCH: 29.2 pg (ref 26.0–34.0)
MCHC: 33.6 g/dL (ref 30.0–36.0)
MCV: 87.1 fL (ref 78.0–100.0)
MONO ABS: 0.3 10*3/uL (ref 0.1–1.0)
MONOS PCT: 2 %
NEUTROS ABS: 14.4 10*3/uL — AB (ref 1.7–7.7)
Neutrophils Relative %: 90 %
Platelets: 431 10*3/uL — ABNORMAL HIGH (ref 150–400)
RBC: 4.65 MIL/uL (ref 3.87–5.11)
RDW: 13.3 % (ref 11.5–15.5)
WBC: 16.1 10*3/uL — ABNORMAL HIGH (ref 4.0–10.5)

## 2015-06-29 LAB — COMPREHENSIVE METABOLIC PANEL
ALT: 11 U/L — ABNORMAL LOW (ref 14–54)
ANION GAP: 9 (ref 5–15)
AST: 15 U/L (ref 15–41)
Albumin: 3.7 g/dL (ref 3.5–5.0)
Alkaline Phosphatase: 80 U/L (ref 38–126)
BILIRUBIN TOTAL: 0.4 mg/dL (ref 0.3–1.2)
BUN: 34 mg/dL — ABNORMAL HIGH (ref 6–20)
CO2: 24 mmol/L (ref 22–32)
Calcium: 9.8 mg/dL (ref 8.9–10.3)
Chloride: 106 mmol/L (ref 101–111)
Creatinine, Ser: 0.9 mg/dL (ref 0.44–1.00)
Glucose, Bld: 114 mg/dL — ABNORMAL HIGH (ref 65–99)
POTASSIUM: 4.6 mmol/L (ref 3.5–5.1)
Sodium: 139 mmol/L (ref 135–145)
TOTAL PROTEIN: 7.9 g/dL (ref 6.5–8.1)

## 2015-06-29 LAB — PROTIME-INR
INR: 1.13 (ref 0.00–1.49)
PROTHROMBIN TIME: 14.2 s (ref 11.6–15.2)

## 2015-06-29 NOTE — Progress Notes (Signed)
CBC/CMET / CXR FAXED AND CALLED TO [facility] OFFICE -spoke with Abigail Butts - said she would inform Dr.Rosenbower

## 2015-06-30 ENCOUNTER — Ambulatory Visit (INDEPENDENT_AMBULATORY_CARE_PROVIDER_SITE_OTHER): Payer: Medicare Other | Admitting: Internal Medicine

## 2015-06-30 ENCOUNTER — Encounter: Payer: Self-pay | Admitting: Internal Medicine

## 2015-06-30 VITALS — BP 154/90 | HR 89 | Wt 122.0 lb

## 2015-06-30 DIAGNOSIS — J189 Pneumonia, unspecified organism: Secondary | ICD-10-CM | POA: Diagnosis not present

## 2015-06-30 DIAGNOSIS — R05 Cough: Secondary | ICD-10-CM | POA: Diagnosis not present

## 2015-06-30 DIAGNOSIS — D72829 Elevated white blood cell count, unspecified: Secondary | ICD-10-CM | POA: Diagnosis not present

## 2015-06-30 DIAGNOSIS — J069 Acute upper respiratory infection, unspecified: Secondary | ICD-10-CM | POA: Diagnosis not present

## 2015-06-30 DIAGNOSIS — R059 Cough, unspecified: Secondary | ICD-10-CM

## 2015-06-30 MED ORDER — CEFDINIR 300 MG PO CAPS: 300.0000 mg | ORAL_CAPSULE | Freq: Two times a day (BID) | ORAL | Status: DC

## 2015-06-30 MED ORDER — FLUTICASONE FUROATE-VILANTEROL 100-25 MCG/INH IN AEPB: 1.0000 | INHALATION_SPRAY | Freq: Every day | RESPIRATORY_TRACT | Status: DC

## 2015-06-30 MED ORDER — BENZONATATE 200 MG PO CAPS: 200.0000 mg | ORAL_CAPSULE | Freq: Two times a day (BID) | ORAL | Status: DC | PRN

## 2015-06-30 NOTE — Assessment & Plan Note (Signed)
4/17 patchy RLL infiltrates Zpac

## 2015-06-30 NOTE — Patient Instructions (Signed)
Afrin

## 2015-06-30 NOTE — Progress Notes (Signed)
Pre visit review using our clinic review tool, if applicable. No additional management support is needed unless otherwise documented below in the visit note. 

## 2015-06-30 NOTE — Progress Notes (Signed)
Subjective:  Patient ID: Kristen Castillo, female    DOB: September 28, 1940  Age: 75 y.o. MRN: [id number]  CC: No chief complaint on file.   HPI BRADYN COLESTOCK presents for cough,elevated WBC and abn CXR in the recent 2-3 wks. Hernia repair had to be cancelled.  Outpatient Prescriptions Prior to Visit  Medication Sig Dispense Refill  . azithromycin (ZITHROMAX Z-PAK) 250 MG tablet Use as directed 6 tablet 1  . calcium carbonate (OS-CAL) 600 MG TABS tablet Take 600 mg by mouth 2 (two) times daily with a meal. Reported on 06/16/2015    . Cholecalciferol 1000 UNITS tablet Take 2,000 Units by mouth 2 (two) times daily.     . diazepam (VALIUM) 5 MG tablet Take 2.5 mg by mouth every 6 (six) hours as needed for anxiety.    . Multiple Vitamin (MULTIVITAMIN) tablet Take 1 tablet by mouth daily.    . ondansetron (ZOFRAN ODT) 4 MG disintegrating tablet Take 1 tablet (4 mg total) by mouth every 6 (six) hours as needed for nausea or vomiting. 30 tablet 0  . pantoprazole (PROTONIX) 40 MG tablet Take 1 tablet (40 mg total) by mouth daily. 30 tablet 6  . predniSONE (DELTASONE) 10 MG tablet 2 tabs by mouth per day for 5 days 10 tablet 0   No facility-administered medications prior to visit.    ROS Review of Systems  Constitutional: Negative for chills, activity change, appetite change, fatigue and unexpected weight change.  HENT: Negative for congestion, mouth sores and sinus pressure.   Eyes: Negative for visual disturbance.  Respiratory: Positive for cough. Negative for chest tightness.   Gastrointestinal: Positive for abdominal distention. Negative for nausea and abdominal pain.  Genitourinary: Negative for frequency, difficulty urinating and vaginal pain.  Musculoskeletal: Negative for back pain and gait problem.  Skin: Negative for pallor and rash.  Neurological: Negative for dizziness, tremors, weakness, numbness and headaches.  Psychiatric/Behavioral: Negative for confusion and sleep disturbance. The  patient is nervous/anxious.     Objective:  BP 154/90 mmHg  Pulse 89  Wt 122 lb (55.339 kg)  SpO2 97%  BP Readings from Last 3 Encounters:  06/30/15 154/90  06/29/15 153/63  06/27/15 110/70    Wt Readings from Last 3 Encounters:  06/30/15 122 lb (55.339 kg)  06/29/15 122 lb (55.339 kg)  06/27/15 122 lb (55.339 kg)    Physical Exam  Constitutional: She appears well-developed. No distress.  HENT:  Head: Normocephalic.  Right Ear: External ear normal.  Left Ear: External ear normal.  Nose: Nose normal.  Mouth/Throat: Oropharynx is clear and moist.  Eyes: Conjunctivae are normal. Pupils are equal, round, and reactive to light. Right eye exhibits no discharge. Left eye exhibits no discharge.  Neck: Normal range of motion. Neck supple. No JVD present. No tracheal deviation present. No thyromegaly present.  Cardiovascular: Normal rate, regular rhythm and normal heart sounds.   Pulmonary/Chest: No stridor. No respiratory distress. She has no wheezes.  Abdominal: Soft. Bowel sounds are normal. She exhibits no distension and no mass. There is no tenderness. There is no rebound and no guarding.  Musculoskeletal: She exhibits tenderness. She exhibits no edema.  Lymphadenopathy:    She has no cervical adenopathy.  Neurological: She displays normal reflexes. No cranial nerve deficit. She exhibits normal muscle tone. Coordination normal.  Skin: No rash noted. No erythema.  Psychiatric: She has a normal mood and affect. Her behavior is normal. Judgment and thought content normal.    Lab Results  Component Value Date   WBC 16.1* 06/29/2015   HGB 13.6 06/29/2015   HCT 40.5 06/29/2015   PLT 431* 06/29/2015   GLUCOSE 114* 06/29/2015   CHOL 186 04/14/2013   TRIG 193* 05/09/2014   HDL 51.90 04/14/2013   LDLCALC 104* 04/14/2013   ALT 11* 06/29/2015   AST 15 06/29/2015   NA 139 06/29/2015   K 4.6 06/29/2015   CL 106 06/29/2015   CREATININE 0.90 06/29/2015   BUN 34* 06/29/2015    CO2 24 06/29/2015   TSH 1.50 01/19/2015   INR 1.13 06/29/2015   HGBA1C 6.0* 11/03/2014    Dg Chest 2 View  06/29/2015  CLINICAL DATA:  Twelve days of cough, nonsmoker ; preoperative exam prior to hernia repair. EXAM: CHEST  2 VIEW COMPARISON:  PA and lateral chest x-ray of June 16, 2015 FINDINGS: There is a hazy infiltrate in the right perihilar region. The right lateral costophrenic gutter is also partially blunted. An infiltrate lies in the right lower lobe on the lateral film. The left lung is clear. The heart and pulmonary vascularity are normal. The mediastinum is normal in width. There tiny calcified lymph nodes in the right hilar region. These are stable. IMPRESSION: Patchy infiltrate in the superior and inferior aspects of the right lower lobe most compatible with pneumonia. Followup PA and lateral chest X-ray is recommended in 3-4 weeks following trial of antibiotic therapy to ensure resolution and exclude underlying malignancy. Electronically Signed   By: David  Martinique M.D.   On: 06/29/2015 14:13    Assessment & Plan:   There are no diagnoses linked to this encounter. I am having Ms. Kafka maintain her Cholecalciferol, calcium carbonate, multivitamin, pantoprazole, ondansetron, diazepam, azithromycin, and predniSONE.  No orders of the defined types were placed in this encounter.     Follow-up: No Follow-up on file.  Walker Kehr, MD

## 2015-06-30 NOTE — Assessment & Plan Note (Signed)
?  asthmatic bronchitis vs other Pulm ref Breo trial qd

## 2015-06-30 NOTE — Assessment & Plan Note (Signed)
4/17 patchy RLL infiltrates

## 2015-07-03 ENCOUNTER — Ambulatory Visit (INDEPENDENT_AMBULATORY_CARE_PROVIDER_SITE_OTHER): Payer: Medicare Other | Admitting: Pulmonary Disease

## 2015-07-03 ENCOUNTER — Encounter: Payer: Self-pay | Admitting: Pulmonary Disease

## 2015-07-03 VITALS — BP 122/72 | HR 93 | Ht 62.25 in | Wt 121.0 lb

## 2015-07-03 DIAGNOSIS — J309 Allergic rhinitis, unspecified: Secondary | ICD-10-CM

## 2015-07-03 DIAGNOSIS — R05 Cough: Secondary | ICD-10-CM

## 2015-07-03 DIAGNOSIS — J189 Pneumonia, unspecified organism: Secondary | ICD-10-CM | POA: Insufficient documentation

## 2015-07-03 DIAGNOSIS — J690 Pneumonitis due to inhalation of food and vomit: Secondary | ICD-10-CM

## 2015-07-03 DIAGNOSIS — Z8701 Personal history of pneumonia (recurrent): Secondary | ICD-10-CM

## 2015-07-03 DIAGNOSIS — R059 Cough, unspecified: Secondary | ICD-10-CM

## 2015-07-03 DIAGNOSIS — K219 Gastro-esophageal reflux disease without esophagitis: Secondary | ICD-10-CM

## 2015-07-03 NOTE — Assessment & Plan Note (Signed)
Take Protonix at HS.

## 2015-07-03 NOTE — Patient Instructions (Signed)
1. Finish off Abx and finish Breo as tolerated.  2. Start Protonix 40 mg at bedtime 3.Chest X ray on May 8th.  Return to clinic in as needed. We will call you with xray results.

## 2015-07-03 NOTE — Progress Notes (Signed)
Subjective:    Patient ID: Kristen Castillo, female    DOB: Nov 09, 1940, 75 y.o.   MRN: [id number]  HPI   This is the case of Kristen Castillo, 75 y.o. Female, who was referred by Dr. Alain Marion  in consultation regarding abnormal CXR and recurrent PNA.   As you very well know, patient is a non smoker (exposed to 2nd hand smoke). She denies any history of asthma or COPD.  Patient with history of diverticulitis for which she ended up having surgery, ostomy. She had revision of that. Also has had ventral hernias related to surgeries.   Patient had a chest x-ray in July 2016. X-ray was done when she was symptomatic with pneumonia. Had cough, congestion, reflux symptoms. Chest x-ray personally reviewed. Showed infiltrate in the right middle lung zone. She got antibiotics and repeat x-ray was better. Ended up having surgery after that.  She is scheduled to have another surgery this month. She had an x-ray on 06/16/2015 which was normal. When she was being seen preoperatively, she requested another chest x-ray because she was coughing, head congestion, reflux symptoms. Her chest x-ray showed infiltrate over the right lower lobe, superior segment. Infiltrate was different in location compared to do July 2016 chest x-ray. She got A's azithromycin. Currently on Omnicef. She was also placed on prednisone taper. She is overall better. Reflux is also better.  She is hopeful to have ventral hernia repair towards the end of May.      Review of Systems  Constitutional: Negative.  Negative for fever and unexpected weight change.  HENT: Positive for congestion. Negative for dental problem, ear pain, nosebleeds, postnasal drip, rhinorrhea, sinus pressure, sneezing, sore throat and trouble swallowing.   Eyes: Negative.  Negative for redness and itching.  Respiratory: Positive for cough. Negative for chest tightness, shortness of breath and wheezing.   Cardiovascular: Negative.  Negative for palpitations and  leg swelling.  Gastrointestinal: Positive for abdominal pain. Negative for nausea and vomiting.  Endocrine: Negative.   Genitourinary: Negative.  Negative for dysuria.  Musculoskeletal: Negative.  Negative for joint swelling.  Skin: Negative.  Negative for rash.  Allergic/Immunologic: Negative.   Neurological: Negative.  Negative for headaches.  Hematological: Negative.  Does not bruise/bleed easily.  Psychiatric/Behavioral: Negative.  Negative for dysphoric mood. The patient is not nervous/anxious.     Past Medical History  Diagnosis Date  . Hematuria     in past  . Hair loss   . Unspecified vitamin D deficiency   . Migraines     in past  . Osteoporosis   . Osteoarthritis   . History of creation of ostomy     past partial colectomy in 05/03/14  . Complication of anesthesia   . PONV (postoperative nausea and vomiting)   . Pneumonia     july 2016  . GERD (gastroesophageal reflux disease)   . Diverticulitis   . Kidney cysts   . Persistent cough   . History of skin cancer   . Deviated nasal septum   . Anxiety   . Cancer ([place])     hx skin cancer  . Ventral hernia   . Congestion of both ears    (-) CA, DVT  Family History  Problem Relation Age of Onset  . Arthritis Mother   . Colon cancer Mother 65  . Heart disease Father   . Dementia Father   . Diabetes Sister   . Cancer Maternal Grandfather     Lung cancer  .  Stomach cancer Neg Hx      Past Surgical History  Procedure Laterality Date  . Abdominal hysterectomy  1972  . Oophorectomy  1976  . Tonsillectomy    . Lipoma resection      left scapula area  . Reduction mammaplasty  1994  . Rhinoplasty  1985  . Facial cosmetic surgery  2009    [name] MD in [place]  . Laparoscopic partial colectomy N/A 05/03/2014    Procedure: DIAGNOSTIC LAPAROSCOPY WITH DRAINAGE OF INTRA ABDOMINAL ABSCESS PARTIAL COLECTOMY ;  Surgeon: [name], MD;  Location: [facility];  Service: General;  Laterality: N/A;  supine position  .  Laparotomy N/A 05/03/2014    Procedure: EXPLORATORY LAPAROTOMY WITH HARTMAN PROCEDURE;  Surgeon: [name], MD;  Location: [facility];  Service: General;  Laterality: N/A;  . Ileo loop colostomy closure N/A 11/08/2014    Procedure: LAPAROSCOPIC COLOSTOMY CLOSURE WITH PARTIAL COLECTOMY;  Surgeon: [name], MD;  Location: [facility];  Service: General;  Laterality: N/A;  . Laparoscopic lysis of adhesions N/A 11/08/2014    Procedure: LAPAROSCOPIC LYSIS OF ADHESIONS FOR 90 MINUTES;  Surgeon: [name], MD;  Location: [facility];  Service: General;  Laterality: N/A;    Social History   Social History  . Marital Status: Married    Spouse Name: N/A  . Number of Children: N/A  . Years of Education: N/A   Occupational History  . Not on file.   Social History Main Topics  . Smoking status: Never Smoker   . Smokeless tobacco: Never Used  . Alcohol Use: No  . Drug Use: No  . Sexual Activity: Not on file   Other Topics Concern  . 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 Codeine Nausea And Vomiting  . Dilaudid [Hydromorphone Hcl] Other (See Comments)    "too strong - I can't wake up"  . Sulfonamide Derivatives Other (See Comments)    Childhood allergy; reaction unknown  . Tape Other (See Comments)    REDDNESS, BLISTER/  Use paper tape     Outpatient Prescriptions Prior to Visit  Medication Sig Dispense Refill  . benzonatate (TESSALON) 200 MG capsule Take 1 capsule (200 mg total) by mouth 2 (two) times daily as needed for cough. 20 capsule 0  . calcium carbonate (OS-CAL) 600 MG TABS tablet Take 600 mg by mouth 2 (two) times daily with a meal. Reported on 06/16/2015    . cefdinir (OMNICEF) 300 MG capsule Take 1 capsule (300 mg total) by mouth 2 (two) times  daily. 20 capsule 0  . diazepam (VALIUM) 5 MG tablet Take 2.5 mg by mouth every 6 (six) hours as needed for anxiety.    . fluticasone furoate-vilanterol (BREO ELLIPTA) 100-25 MCG/INH AEPB Inhale 1 puff into the lungs daily. 1 each 5  . Multiple Vitamin (MULTIVITAMIN) tablet Take 1 tablet by mouth daily.    . ondansetron (ZOFRAN ODT) 4 MG disintegrating tablet Take 1 tablet (4 mg total) by mouth every 6 (six) hours as needed for nausea or vomiting. 30 tablet 0  . pantoprazole (PROTONIX) 40 MG tablet Take 1 tablet (40 mg total) by mouth daily. 30 tablet 6  . Cholecalciferol 1000 UNITS tablet Take 2,000 Units  by mouth 2 (two) times daily. Reported on 07/03/2015    . azithromycin (ZITHROMAX Z-PAK) 250 MG tablet Use as directed 6 tablet 1  . predniSONE (DELTASONE) 10 MG tablet 2 tabs by mouth per day for 5 days 10 tablet 0   No facility-administered medications prior to visit.   Meds ordered this encounter  Medications  . Oxymetazoline HCl (AFRIN NASAL SPRAY NA)    Sig: Place into the nose.  . fluticasone (FLONASE) 50 MCG/ACT nasal spray    Sig: Place into both nostrils daily.           Objective:   Physical Exam   Vitals:  Filed Vitals:   07/03/15 0908  BP: 122/72  Pulse: 93  Height: 5' 2.25" (1.581 m)  Weight: 121 lb (54.885 kg)  SpO2: 96%    Constitutional/General:  Pleasant, well-nourished, well-developed, not in any distress,  Comfortably seating.  Well kempt  Body mass index is 21.96 kg/(m^2). Wt Readings from Last 3 Encounters:  07/03/15 121 lb (54.885 kg)  06/30/15 122 lb (55.339 kg)  06/29/15 122 lb (55.339 kg)      HEENT: Pupils equal and reactive to light and accommodation. Anicteric sclerae. Normal nasal mucosa.   No oral  lesions,  mouth clear,  oropharynx clear, no postnasal drip. (-) Oral thrush. No dental caries.  Airway - Mallampati class III  Neck: No masses. Midline trachea. No JVD, (-) LAD. (-) bruits appreciated.  Respiratory/Chest: Grossly  normal chest. (-) deformity. (-) Accessory muscle use.  Symmetric expansion. (-) Tenderness on palpation.  Resonant on percussion.  Diminished BS on both lower lung zones. some wheezing, (-) crackles, rhonchi (-) egophony  Cardiovascular: Regular rate and  rhythm, heart sounds normal, no murmur or gallops, no peripheral edema  Gastrointestinal:  Normal bowel sounds. Soft, non-tender. No hepatosplenomegaly.  (-) masses.   Musculoskeletal:  Normal muscle tone. Normal gait.   Extremities: Grossly normal. (-) clubbing, cyanosis.  (-) edema  Skin: (-) rash,lesions seen.   Neurological/Psychiatric : alert, oriented to time, place, person. Normal mood and affect           Assessment & Plan:  Pneumonia Pt has recurrent PNA. Likely related to GERD. Pt with diverticulitis and has had sugeries related to that and also with hernias. She had CXR in July 2016 which showed RML PNA. Improved with abx.  No recurrence of sx until April 2017. CXR 06/16/15 no infiltrate CXR 06/29/15 RML infiltrate. Received Zpak and currently on omnicef. Finished pred.  Feels  Better.   I think this patient is aspirating when her reflux is uncontrolled. By history, she gets symptomatic every now and then, feels epigastric burning and pain injury that time she gets diagnosed with pneumonia. She is on PPI but she takes it when necessary.  Plan: 1. Advised her to take her Protonix, 40 mg at bedtime. We want her optimized prior to surgery. After the surgery, she can switch the PPI to H2 blocker twice a day because of concern with osteoporosis. She can try weaning off but if she has recurrent pneumonia, she has to be on reflux medicines indefinitely. 2. Finish off antibiotics. 3. Wean off the [facility] as well. May also cause pneumonia theoretically. If she starts wheezing of Breo, told her to restart it. 4. Diet changes. Aspiration precaution. 5. Plan for chest x-ray in 2 weeks. If no infiltrates, she can proceed with  ventral hernia repair. If she has persistent infiltrates, she will need a chest CT scan. Need to  call her with chest x-ray report.  Cough Related to recent PNA. Better. Wean off tessalon.   Allergic rhinitis Cont flonase.   GERD (gastroesophageal reflux disease) Take Protonix at HS.     I personally reviewed previous images (Chest Xray, Chest Ct scan) done on this patient. I reviewed the reports on the images as well.     Need to call Dr. Angie Fava [facility] regarding clearance once with CXR on May 8th.     Thank you very much for letting me participate in this patient's care. Please do not hesitate to give me a call if you have any questions or concerns regarding the treatment plan.   Patient will follow up with me in as needed. We will touch base with the pt after her CXR is done. Needs f/u if CXR will be abnormal.     Monica Becton, MD 07/03/2015   11:18 AM Pulmonary and [place] Pager: [phone] Office: [phone], Fax: [phone]

## 2015-07-03 NOTE — Assessment & Plan Note (Signed)
Related to recent PNA. Better. Wean off tessalon.

## 2015-07-03 NOTE — Assessment & Plan Note (Signed)
Pt has recurrent PNA. Likely related to GERD. Pt with diverticulitis and has had sugeries related to that and also with hernias. She had CXR in July 2016 which showed RML PNA. Improved with abx.  No recurrence of sx until April 2017. CXR 06/16/15 no infiltrate CXR 06/29/15 RML infiltrate. Received Zpak and currently on omnicef. Finished pred.  Feels  Better.   I think this patient is aspirating when her reflux is uncontrolled. By history, she gets symptomatic every now and then, feels epigastric burning and pain injury that time she gets diagnosed with pneumonia. She is on PPI but she takes it when necessary.  Plan: 1. Advised her to take her Protonix, 40 mg at bedtime. We want her optimized prior to surgery. After the surgery, she can switch the PPI to H2 blocker twice a day because of concern with osteoporosis. She can try weaning off but if she has recurrent pneumonia, she has to be on reflux medicines indefinitely. 2. Finish off antibiotics. 3. Wean off the [facility] as well. May also cause pneumonia theoretically. If she starts wheezing of Breo, told her to restart it. 4. Diet changes. Aspiration precaution. 5. Plan for chest x-ray in 2 weeks. If no infiltrates, she can proceed with ventral hernia repair. If she has persistent infiltrates, she will need a chest CT scan. Need to call her with chest x-ray report.

## 2015-07-03 NOTE — Assessment & Plan Note (Signed)
Cont flonase.    

## 2015-07-04 LAB — TYPE AND SCREEN
ABO/RH(D): A POS
ANTIBODY SCREEN: NEGATIVE

## 2015-07-05 ENCOUNTER — Encounter: Payer: Self-pay | Admitting: Internal Medicine

## 2015-07-05 DIAGNOSIS — H6522 Chronic serous otitis media, left ear: Secondary | ICD-10-CM | POA: Diagnosis not present

## 2015-07-05 DIAGNOSIS — H6123 Impacted cerumen, bilateral: Secondary | ICD-10-CM | POA: Diagnosis not present

## 2015-07-05 DIAGNOSIS — D72829 Elevated white blood cell count, unspecified: Secondary | ICD-10-CM | POA: Insufficient documentation

## 2015-07-05 DIAGNOSIS — H903 Sensorineural hearing loss, bilateral: Secondary | ICD-10-CM | POA: Diagnosis not present

## 2015-07-05 DIAGNOSIS — H6982 Other specified disorders of Eustachian tube, left ear: Secondary | ICD-10-CM | POA: Diagnosis not present

## 2015-07-05 NOTE — Assessment & Plan Note (Addendum)
Due to steroids Will re-check

## 2015-07-17 ENCOUNTER — Ambulatory Visit (INDEPENDENT_AMBULATORY_CARE_PROVIDER_SITE_OTHER)
Admission: RE | Admit: 2015-07-17 | Discharge: 2015-07-17 | Disposition: A | Discharge status: Home / Self Care | Payer: Medicare Other | Source: Ambulatory Visit | Attending: Pulmonary Disease | Admitting: Pulmonary Disease

## 2015-07-17 ENCOUNTER — Encounter: Payer: Self-pay | Admitting: Pulmonary Disease

## 2015-07-17 DIAGNOSIS — Z8701 Personal history of pneumonia (recurrent): Secondary | ICD-10-CM

## 2015-07-17 DIAGNOSIS — J189 Pneumonia, unspecified organism: Secondary | ICD-10-CM | POA: Diagnosis not present

## 2015-07-18 ENCOUNTER — Telehealth: Payer: Self-pay | Admitting: Pulmonary Disease

## 2015-07-18 NOTE — Telephone Encounter (Signed)
Result Notes     Notes Recorded by Beckie Busing, CMA on 07/17/2015 at 5:18 PM Attempted to contact patient regarding results. Left message on voicemail for patient to return call. ------  Notes Recorded by Rush Landmark, MD on 07/17/2015 at 4:17 PM pls tell pt the CXR is significantly better compared to April cxr. Still some "PNA" but this will get better in time. If she feels OK (just like when I saw her), she can go ahead with surgery as planned.   Spoke with pt. She is aware of results. Nothing further was needed.

## 2015-07-20 DIAGNOSIS — H903 Sensorineural hearing loss, bilateral: Secondary | ICD-10-CM | POA: Diagnosis not present

## 2015-07-21 ENCOUNTER — Other Ambulatory Visit: Payer: Self-pay | Admitting: General Surgery

## 2015-07-25 NOTE — Patient Instructions (Signed)
CHEE SULEK  07/25/2015   Your procedure is scheduled on: 08/01/2015    Report to [facility]  Entrance take [place]  elevators to 3rd floor to  [place] at    [place] AM.  Call this number if you have problems the morning of surgery [phone]   Remember: ONLY 1 PERSON MAY GO WITH YOU TO SHORT STAY TO GET  READY MORNING OF [place].  Do not eat food or drink liquids :After Midnight.     Take these medicines the morning of surgery with A SIP OF WATER: Protonix                                 You may not have any metal on your body including hair pins and              piercings  Do not wear jewelry, make-up, lotions, powders or perfumes, deodorant             Do not wear nail polish.  Do not shave  48 hours prior to surgery.             Do not bring valuables to the hospital. [place].  Contacts, dentures or bridgework may not be worn into surgery.  Leave suitcase in the car. After surgery it may be brought to your room.       Special Instructions: coughing and deep breathing exercises, leg exercises               Please read over the following fact sheets you were given: _____________________________________________________________________             [facility] - Preparing for Surgery Before surgery, you can play an important role.  Because skin is not sterile, your skin needs to be as free of germs as possible.  You can reduce the number of germs on your skin by washing with CHG (chlorahexidine gluconate) soap before surgery.  CHG is an antiseptic cleaner which kills germs and bonds with the skin to continue killing germs even after washing. Please DO NOT use if you have an allergy to CHG or antibacterial soaps.  If your skin becomes reddened/irritated stop using the CHG and inform your nurse when you arrive at Short Stay. Do not shave (including legs and underarms) for at least 48  hours prior to the first CHG shower.  You may shave your face/neck. Please follow these instructions carefully:  1.  Shower with CHG Soap the night before surgery and the  morning of Surgery.  2.  If you choose to wash your hair, wash your hair first as usual with your  normal  shampoo.  3.  After you shampoo, rinse your hair and body thoroughly to remove the  shampoo.                           4.  Use CHG as you would any other liquid soap.  You can apply chg directly  to the skin and wash                       Gently with a scrungie or  clean washcloth.  5.  Apply the CHG Soap to your body ONLY FROM THE NECK DOWN.   Do not use on face/ open                           Wound or open sores. Avoid contact with eyes, ears mouth and genitals (private parts).                       Wash face,  Genitals (private parts) with your normal soap.             6.  Wash thoroughly, paying special attention to the area where your surgery  will be performed.  7.  Thoroughly rinse your body with warm water from the neck down.  8.  DO NOT shower/wash with your normal soap after using and rinsing off  the CHG Soap.                9.  Pat yourself dry with a clean towel.            10.  Wear clean pajamas.            11.  Place clean sheets on your bed the night of your first shower and do not  sleep with pets. Day of Surgery : Do not apply any lotions/deodorants the morning of surgery.  Please wear clean clothes to the hospital/surgery center.  FAILURE TO FOLLOW THESE INSTRUCTIONS MAY RESULT IN THE CANCELLATION OF YOUR SURGERY PATIENT SIGNATURE_________________________________  NURSE SIGNATURE__________________________________  ________________________________________________________________________

## 2015-07-26 ENCOUNTER — Ambulatory Visit (HOSPITAL_COMMUNITY)
Admission: RE | Admit: 2015-07-26 | Discharge: 2015-07-26 | Disposition: A | Discharge status: Home / Self Care | Payer: Medicare Other | Source: Ambulatory Visit | Attending: Anesthesiology | Admitting: Anesthesiology

## 2015-07-26 ENCOUNTER — Encounter (HOSPITAL_COMMUNITY): Payer: Self-pay

## 2015-07-26 ENCOUNTER — Encounter (HOSPITAL_COMMUNITY)
Admission: RE | Admit: 2015-07-26 | Discharge: 2015-07-26 | Disposition: A | Discharge status: Home / Self Care | Payer: Medicare Other | Source: Ambulatory Visit | Attending: General Surgery | Admitting: General Surgery

## 2015-07-26 DIAGNOSIS — Z01818 Encounter for other preprocedural examination: Secondary | ICD-10-CM | POA: Insufficient documentation

## 2015-07-26 DIAGNOSIS — R918 Other nonspecific abnormal finding of lung field: Secondary | ICD-10-CM | POA: Diagnosis not present

## 2015-07-26 DIAGNOSIS — J189 Pneumonia, unspecified organism: Secondary | ICD-10-CM | POA: Diagnosis not present

## 2015-07-26 HISTORY — DX: Age-related osteoporosis without current pathological fracture: M81.0

## 2015-07-26 LAB — BASIC METABOLIC PANEL
ANION GAP: 5 (ref 5–15)
BUN: 35 mg/dL — ABNORMAL HIGH (ref 6–20)
CALCIUM: 9.5 mg/dL (ref 8.9–10.3)
CO2: 26 mmol/L (ref 22–32)
Chloride: 107 mmol/L (ref 101–111)
Creatinine, Ser: 0.95 mg/dL (ref 0.44–1.00)
GFR calc Af Amer: >60 mL/min (ref >60)
GFR calc non Af Amer: 58 mL/min — ABNORMAL LOW (ref >60)
GLUCOSE: 116 mg/dL — AB (ref 65–99)
Potassium: 3.9 mmol/L (ref 3.5–5.1)
Sodium: 138 mmol/L (ref 135–145)

## 2015-07-26 LAB — CBC
HEMATOCRIT: 43.5 % (ref 36.0–46.0)
Hemoglobin: 13.7 g/dL (ref 12.0–15.0)
MCH: 28.4 pg (ref 26.0–34.0)
MCHC: 31.5 g/dL (ref 30.0–36.0)
MCV: 90.2 fL (ref 78.0–100.0)
Platelets: 327 10*3/uL (ref 150–400)
RBC: 4.82 MIL/uL (ref 3.87–5.11)
RDW: 14.1 % (ref 11.5–15.5)
WBC: 9 10*3/uL (ref 4.0–10.5)

## 2015-07-26 NOTE — Progress Notes (Signed)
06/30/15- PCP office visit- regarding pneumonia  07/03/15- LOV- Pulmonary- EPIC

## 2015-07-26 NOTE — Progress Notes (Signed)
2V CXR result done 07/26/15 faxed via EPIC to Dr Zella Richer.

## 2015-07-26 NOTE — Progress Notes (Signed)
Anesthesia made aware of CXR report from 07/26/15 and CXR report from 06/29/15 along with  Sat 97% on Room Air, Temp- 97.6. White count - normal on 07/26/15 .  No new orders given .

## 2015-07-26 NOTE — Progress Notes (Signed)
BMP done 07/26/15 faxed via EPIC to Dr Zella Richer.

## 2015-07-26 NOTE — Progress Notes (Signed)
Patient called back with name of medications that needed to be added to medication list to include: rhinocort and sudafed.

## 2015-07-28 DIAGNOSIS — Z85828 Personal history of other malignant neoplasm of skin: Secondary | ICD-10-CM | POA: Diagnosis not present

## 2015-07-28 DIAGNOSIS — D1801 Hemangioma of skin and subcutaneous tissue: Secondary | ICD-10-CM | POA: Diagnosis not present

## 2015-07-28 DIAGNOSIS — L821 Other seborrheic keratosis: Secondary | ICD-10-CM | POA: Diagnosis not present

## 2015-07-28 DIAGNOSIS — I8392 Asymptomatic varicose veins of left lower extremity: Secondary | ICD-10-CM | POA: Diagnosis not present

## 2015-07-28 DIAGNOSIS — D2361 Other benign neoplasm of skin of right upper limb, including shoulder: Secondary | ICD-10-CM | POA: Diagnosis not present

## 2015-07-28 DIAGNOSIS — D225 Melanocytic nevi of trunk: Secondary | ICD-10-CM | POA: Diagnosis not present

## 2015-08-01 ENCOUNTER — Ambulatory Visit (HOSPITAL_COMMUNITY): Payer: Medicare Other | Admitting: Anesthesiology

## 2015-08-01 ENCOUNTER — Encounter (HOSPITAL_COMMUNITY): Payer: Self-pay

## 2015-08-01 ENCOUNTER — Encounter (HOSPITAL_COMMUNITY)
Admission: RE | Disposition: A | Discharge status: Home / Self Care | Payer: Self-pay | Source: Ambulatory Visit | Attending: General Surgery

## 2015-08-01 ENCOUNTER — Observation Stay (HOSPITAL_COMMUNITY)
Admission: RE | Admit: 2015-08-01 | Discharge: 2015-08-06 | Disposition: A | Discharge status: Home / Self Care | Payer: Medicare Other | Source: Ambulatory Visit | Attending: General Surgery | Admitting: General Surgery

## 2015-08-01 DIAGNOSIS — M810 Age-related osteoporosis without current pathological fracture: Secondary | ICD-10-CM | POA: Insufficient documentation

## 2015-08-01 DIAGNOSIS — K219 Gastro-esophageal reflux disease without esophagitis: Secondary | ICD-10-CM | POA: Diagnosis not present

## 2015-08-01 DIAGNOSIS — K439 Ventral hernia without obstruction or gangrene: Secondary | ICD-10-CM | POA: Diagnosis present

## 2015-08-01 DIAGNOSIS — Z7951 Long term (current) use of inhaled steroids: Secondary | ICD-10-CM | POA: Diagnosis not present

## 2015-08-01 DIAGNOSIS — Z9049 Acquired absence of other specified parts of digestive tract: Secondary | ICD-10-CM | POA: Diagnosis not present

## 2015-08-01 DIAGNOSIS — F419 Anxiety disorder, unspecified: Secondary | ICD-10-CM | POA: Diagnosis not present

## 2015-08-01 DIAGNOSIS — Z79899 Other long term (current) drug therapy: Secondary | ICD-10-CM | POA: Insufficient documentation

## 2015-08-01 DIAGNOSIS — K432 Incisional hernia without obstruction or gangrene: Secondary | ICD-10-CM | POA: Diagnosis not present

## 2015-08-01 DIAGNOSIS — M19049 Primary osteoarthritis, unspecified hand: Secondary | ICD-10-CM | POA: Insufficient documentation

## 2015-08-01 DIAGNOSIS — K567 Ileus, unspecified: Secondary | ICD-10-CM | POA: Diagnosis not present

## 2015-08-01 HISTORY — PX: VENTRAL HERNIA REPAIR: SHX424

## 2015-08-01 HISTORY — PX: LAPAROSCOPIC LYSIS OF ADHESIONS: SHX5905

## 2015-08-01 HISTORY — PX: INSERTION OF MESH: SHX5868

## 2015-08-01 SURGERY — REPAIR, HERNIA, VENTRAL, LAPAROSCOPIC
Anesthesia: General

## 2015-08-01 MED ORDER — ONDANSETRON HCL 4 MG/2ML IJ SOLN
INTRAMUSCULAR | Status: DC | PRN
  Administered 2015-08-01: 4 mg via INTRAVENOUS

## 2015-08-01 MED ORDER — ACETAMINOPHEN 325 MG PO TABS: 650.0000 mg | ORAL_TABLET | Freq: Four times a day (QID) | ORAL | Status: DC | PRN

## 2015-08-01 MED ORDER — MEPERIDINE HCL 50 MG/ML IJ SOLN: 6.2500 mg | INTRAMUSCULAR | Status: DC | PRN

## 2015-08-01 MED ORDER — ROCURONIUM BROMIDE 100 MG/10ML IV SOLN
INTRAVENOUS | Status: AC
  Filled 2015-08-01: qty 1

## 2015-08-01 MED ORDER — PANTOPRAZOLE SODIUM 40 MG PO TBEC
40.0000 mg | DELAYED_RELEASE_TABLET | Freq: Every day | ORAL | Status: DC
  Filled 2015-08-01: qty 1

## 2015-08-01 MED ORDER — MORPHINE SULFATE (PF) 2 MG/ML IV SOLN
2.0000 mg | INTRAVENOUS | Status: DC | PRN
  Administered 2015-08-01 – 2015-08-03 (×8): 2 mg via INTRAVENOUS
  Filled 2015-08-01 (×9): qty 1

## 2015-08-01 MED ORDER — DEXAMETHASONE SODIUM PHOSPHATE 10 MG/ML IJ SOLN
INTRAMUSCULAR | Status: DC | PRN
  Administered 2015-08-01: 10 mg via INTRAVENOUS

## 2015-08-01 MED ORDER — PROPOFOL 10 MG/ML IV BOLUS
INTRAVENOUS | Status: AC
  Filled 2015-08-01: qty 20

## 2015-08-01 MED ORDER — ONDANSETRON 4 MG PO TBDP: 4.0000 mg | ORAL_TABLET | Freq: Four times a day (QID) | ORAL | Status: DC | PRN

## 2015-08-01 MED ORDER — CEFAZOLIN SODIUM-DEXTROSE 2-4 GM/100ML-% IV SOLN
2.0000 g | INTRAVENOUS | Status: AC
  Administered 2015-08-01: 2 g via INTRAVENOUS

## 2015-08-01 MED ORDER — ONDANSETRON HCL 4 MG/2ML IJ SOLN: 4.0000 mg | Freq: Once | INTRAMUSCULAR | Status: DC | PRN

## 2015-08-01 MED ORDER — SUGAMMADEX SODIUM 200 MG/2ML IV SOLN
INTRAVENOUS | Status: AC
  Filled 2015-08-01: qty 2

## 2015-08-01 MED ORDER — SCOPOLAMINE 1 MG/3DAYS TD PT72
MEDICATED_PATCH | TRANSDERMAL | Status: DC | PRN
  Administered 2015-08-01: 1 via TRANSDERMAL

## 2015-08-01 MED ORDER — FENTANYL CITRATE (PF) 100 MCG/2ML IJ SOLN
25.0000 ug | INTRAMUSCULAR | Status: DC | PRN
  Administered 2015-08-01: 25 ug via INTRAVENOUS

## 2015-08-01 MED ORDER — OXYCODONE HCL 5 MG PO TABS: 5.0000 mg | ORAL_TABLET | ORAL | Status: DC | PRN

## 2015-08-01 MED ORDER — LIDOCAINE HCL (CARDIAC) 20 MG/ML IV SOLN
INTRAVENOUS | Status: DC | PRN
  Administered 2015-08-01: 50 mg via INTRAVENOUS

## 2015-08-01 MED ORDER — ONDANSETRON HCL 4 MG/2ML IJ SOLN
4.0000 mg | INTRAMUSCULAR | Status: DC | PRN
  Administered 2015-08-01 – 2015-08-03 (×8): 4 mg via INTRAVENOUS
  Filled 2015-08-01 (×8): qty 2

## 2015-08-01 MED ORDER — SUGAMMADEX SODIUM 200 MG/2ML IV SOLN
INTRAVENOUS | Status: DC | PRN
  Administered 2015-08-01: 120 mg via INTRAVENOUS

## 2015-08-01 MED ORDER — FENTANYL CITRATE (PF) 100 MCG/2ML IJ SOLN
INTRAMUSCULAR | Status: AC
  Filled 2015-08-01: qty 2

## 2015-08-01 MED ORDER — BUPIVACAINE-EPINEPHRINE 0.25% -1:200000 IJ SOLN
INTRAMUSCULAR | Status: DC | PRN
  Administered 2015-08-01: 7 mL

## 2015-08-01 MED ORDER — CEFAZOLIN SODIUM-DEXTROSE 2-4 GM/100ML-% IV SOLN
2.0000 g | Freq: Three times a day (TID) | INTRAVENOUS | Status: AC
  Administered 2015-08-01: 2 g via INTRAVENOUS
  Filled 2015-08-01: qty 100

## 2015-08-01 MED ORDER — PROPOFOL 10 MG/ML IV BOLUS
INTRAVENOUS | Status: DC | PRN
  Administered 2015-08-01: 150 mg via INTRAVENOUS

## 2015-08-01 MED ORDER — BUPIVACAINE-EPINEPHRINE (PF) 0.25% -1:200000 IJ SOLN
INTRAMUSCULAR | Status: AC
  Filled 2015-08-01: qty 30

## 2015-08-01 MED ORDER — FENTANYL CITRATE (PF) 100 MCG/2ML IJ SOLN
INTRAMUSCULAR | Status: DC | PRN
  Administered 2015-08-01: 50 ug via INTRAVENOUS
  Administered 2015-08-01: 100 ug via INTRAVENOUS
  Administered 2015-08-01 (×2): 25 ug via INTRAVENOUS

## 2015-08-01 MED ORDER — PHENYLEPHRINE 40 MCG/ML (10ML) SYRINGE FOR IV PUSH (FOR BLOOD PRESSURE SUPPORT)
PREFILLED_SYRINGE | INTRAVENOUS | Status: AC
  Filled 2015-08-01: qty 10

## 2015-08-01 MED ORDER — MIDAZOLAM HCL 2 MG/2ML IJ SOLN
INTRAMUSCULAR | Status: AC
  Filled 2015-08-01: qty 2

## 2015-08-01 MED ORDER — DIAZEPAM 5 MG PO TABS: 2.5000 mg | ORAL_TABLET | Freq: Four times a day (QID) | ORAL | Status: DC | PRN

## 2015-08-01 MED ORDER — CEFAZOLIN SODIUM-DEXTROSE 2-4 GM/100ML-% IV SOLN
INTRAVENOUS | Status: AC
  Filled 2015-08-01: qty 100

## 2015-08-01 MED ORDER — DEXAMETHASONE SODIUM PHOSPHATE 10 MG/ML IJ SOLN
INTRAMUSCULAR | Status: AC
  Filled 2015-08-01: qty 1

## 2015-08-01 MED ORDER — ONDANSETRON HCL 4 MG/2ML IJ SOLN
INTRAMUSCULAR | Status: AC
  Filled 2015-08-01: qty 2

## 2015-08-01 MED ORDER — HEPARIN SODIUM (PORCINE) 5000 UNIT/ML IJ SOLN
5000.0000 [IU] | Freq: Three times a day (TID) | INTRAMUSCULAR | Status: DC
  Administered 2015-08-02 – 2015-08-06 (×14): 5000 [IU] via SUBCUTANEOUS
  Filled 2015-08-01 (×16): qty 1

## 2015-08-01 MED ORDER — CEFAZOLIN SODIUM-DEXTROSE 2-4 GM/100ML-% IV SOLN
2.0000 g | Freq: Three times a day (TID) | INTRAVENOUS | Status: DC
  Filled 2015-08-01: qty 100

## 2015-08-01 MED ORDER — LACTATED RINGERS IV SOLN
INTRAVENOUS | Status: DC | PRN
  Administered 2015-08-01 (×2): via INTRAVENOUS

## 2015-08-01 MED ORDER — ROCURONIUM BROMIDE 100 MG/10ML IV SOLN
INTRAVENOUS | Status: DC | PRN
  Administered 2015-08-01: 10 mg via INTRAVENOUS
  Administered 2015-08-01: 40 mg via INTRAVENOUS
  Administered 2015-08-01: 10 mg via INTRAVENOUS

## 2015-08-01 MED ORDER — MIDAZOLAM HCL 5 MG/5ML IJ SOLN
INTRAMUSCULAR | Status: DC | PRN
  Administered 2015-08-01: 2 mg via INTRAVENOUS

## 2015-08-01 MED ORDER — BUPIVACAINE HCL (PF) 0.25 % IJ SOLN
INTRAMUSCULAR | Status: AC
  Filled 2015-08-01: qty 30

## 2015-08-01 MED ORDER — FLUTICASONE PROPIONATE 50 MCG/ACT NA SUSP
1.0000 | Freq: Every day | NASAL | Status: DC
  Administered 2015-08-02 – 2015-08-05 (×4): 1 via NASAL
  Filled 2015-08-01: qty 16

## 2015-08-01 MED ORDER — CETYLPYRIDINIUM CHLORIDE 0.05 % MT LIQD
7.0000 mL | Freq: Two times a day (BID) | OROMUCOSAL | Status: DC
  Administered 2015-08-01 – 2015-08-05 (×10): 7 mL via OROMUCOSAL

## 2015-08-01 MED ORDER — PHENYLEPHRINE HCL 10 MG/ML IJ SOLN
INTRAMUSCULAR | Status: DC | PRN
  Administered 2015-08-01: 40 ug via INTRAVENOUS

## 2015-08-01 MED ORDER — KCL IN DEXTROSE-NACL 20-5-0.9 MEQ/L-%-% IV SOLN
INTRAVENOUS | Status: DC
  Administered 2015-08-01 – 2015-08-03 (×4): via INTRAVENOUS
  Administered 2015-08-04: 50 mL/h via INTRAVENOUS
  Filled 2015-08-01 (×6): qty 1000

## 2015-08-01 MED ORDER — LIDOCAINE HCL (CARDIAC) 20 MG/ML IV SOLN
INTRAVENOUS | Status: AC
  Filled 2015-08-01: qty 5

## 2015-08-01 MED ORDER — BENZONATATE 100 MG PO CAPS: 200.0000 mg | ORAL_CAPSULE | Freq: Two times a day (BID) | ORAL | Status: DC | PRN

## 2015-08-01 MED ORDER — SCOPOLAMINE 1 MG/3DAYS TD PT72
MEDICATED_PATCH | TRANSDERMAL | Status: AC
  Filled 2015-08-01: qty 1

## 2015-08-01 MED ORDER — LACTATED RINGERS IV SOLN: INTRAVENOUS | Status: DC

## 2015-08-01 SURGICAL SUPPLY — 40 items
BANDAGE ADH SHEER 1  50/CT (GAUZE/BANDAGES/DRESSINGS) ×8 IMPLANT
BENZOIN TINCTURE PRP APPL 2/3 (GAUZE/BANDAGES/DRESSINGS) ×2 IMPLANT
BINDER ABDOMINAL 12 ML 46-62 (SOFTGOODS) IMPLANT
CABLE HIGH FREQUENCY MONO STRZ (ELECTRODE) ×2 IMPLANT
CHLORAPREP W/TINT 26ML (MISCELLANEOUS) ×2 IMPLANT
COVER SURGICAL LIGHT HANDLE (MISCELLANEOUS) ×2 IMPLANT
DECANTER SPIKE VIAL GLASS SM (MISCELLANEOUS) ×2 IMPLANT
DEVICE TROCAR PUNCTURE CLOSURE (ENDOMECHANICALS) ×2 IMPLANT
DISSECTOR BLUNT TIP ENDO 5MM (MISCELLANEOUS) IMPLANT
DRAPE INCISE IOBAN 66X45 STRL (DRAPES) ×2 IMPLANT
DRAPE LAPAROSCOPIC ABDOMINAL (DRAPES) ×2 IMPLANT
DRSG TEGADERM 2-3/8X2-3/4 SM (GAUZE/BANDAGES/DRESSINGS) ×6 IMPLANT
ELECT REM PT RETURN 9FT ADLT (ELECTROSURGICAL) ×2
ELECTRODE REM PT RTRN 9FT ADLT (ELECTROSURGICAL) ×1 IMPLANT
GLOVE ECLIPSE 8.0 STRL XLNG CF (GLOVE) ×2 IMPLANT
GLOVE INDICATOR 8.0 STRL GRN (GLOVE) ×4 IMPLANT
GOWN STRL REUS W/TWL XL LVL3 (GOWN DISPOSABLE) ×4 IMPLANT
KIT BASIN OR (CUSTOM PROCEDURE TRAY) ×2 IMPLANT
MARKER SKIN DUAL TIP RULER LAB (MISCELLANEOUS) ×2 IMPLANT
MESH VENTRALIGHT ST 10X13IN (Mesh General) ×2 IMPLANT
NEEDLE SPNL 22GX3.5 QUINCKE BK (NEEDLE) ×2 IMPLANT
SCISSORS LAP 5X35 DISP (ENDOMECHANICALS) ×2 IMPLANT
SET IRRIG TUBING LAPAROSCOPIC (IRRIGATION / IRRIGATOR) IMPLANT
SHEARS HARMONIC ACE PLUS 36CM (ENDOMECHANICALS) IMPLANT
SLEEVE XCEL OPT CAN 5 100 (ENDOMECHANICALS) ×4 IMPLANT
SOLUTION ANTI FOG 6CC (MISCELLANEOUS) ×2 IMPLANT
STRIP CLOSURE SKIN 1/2X4 (GAUZE/BANDAGES/DRESSINGS) ×2 IMPLANT
SUT MNCRL AB 4-0 PS2 18 (SUTURE) ×4 IMPLANT
SUT NOVA NAB DX-16 0-1 5-0 T12 (SUTURE) ×4 IMPLANT
SUT PROLENE 0 CT 1 CR/8 (SUTURE) IMPLANT
TACKER 5MM HERNIA 3.5CML NAB (ENDOMECHANICALS) ×4 IMPLANT
TOWEL OR 17X26 10 PK STRL BLUE (TOWEL DISPOSABLE) ×2 IMPLANT
TRAY FOLEY W/METER SILVER 14FR (SET/KITS/TRAYS/PACK) ×2 IMPLANT
TRAY FOLEY W/METER SILVER 16FR (SET/KITS/TRAYS/PACK) IMPLANT
TRAY LAPAROSCOPIC (CUSTOM PROCEDURE TRAY) ×2 IMPLANT
TROCAR BLADELESS OPT 5 100 (ENDOMECHANICALS) ×2 IMPLANT
TROCAR XCEL 12X100 BLDLESS (ENDOMECHANICALS) ×2 IMPLANT
TROCAR XCEL BLUNT TIP 100MML (ENDOMECHANICALS) IMPLANT
TROCAR XCEL NON-BLD 11X100MML (ENDOMECHANICALS) IMPLANT
TUBING INSUF HEATED (TUBING) ×2 IMPLANT

## 2015-08-01 NOTE — Anesthesia Postprocedure Evaluation (Signed)
Anesthesia Post Note  Patient: Kristen Castillo  Procedure(s) Performed: Procedure(s) (LRB): LAPAROSCOPIC VENTRAL INCISIONAL  HERNIA (N/A) INSERTION OF MESH (N/A) LAPAROSCOPIC LYSIS OF ADHESIONS (N/A)  Patient location during evaluation: PACU Anesthesia Type: General Level of consciousness: awake and alert Pain management: pain level controlled Vital Signs Assessment: post-procedure vital signs reviewed and stable Respiratory status: spontaneous breathing, nonlabored ventilation, respiratory function stable and patient connected to nasal cannula oxygen Cardiovascular status: blood pressure returned to baseline and stable Postop Assessment: no signs of nausea or vomiting Anesthetic complications: no    Last Vitals:  Filed Vitals:   08/01/15 1109 08/01/15 1129  BP:  140/57  Pulse:    Temp: 36.8 C 37.7 C  Resp:      Last Pain:  Filed Vitals:   08/01/15 1130  PainSc: Porter DAVID

## 2015-08-01 NOTE — Transfer of Care (Signed)
Immediate Anesthesia Transfer of Care Note  Patient: Kristen Castillo  Procedure(s) Performed: Procedure(s): LAPAROSCOPIC VENTRAL INCISIONAL  HERNIA (N/A) INSERTION OF MESH (N/A) LAPAROSCOPIC LYSIS OF ADHESIONS (N/A)  Patient Location: PACU  Anesthesia Type:General  Level of Consciousness:  sedated, patient cooperative and responds to stimulation  Airway & Oxygen Therapy:Patient Spontanous Breathing and Patient connected to face mask oxgen  Post-op Assessment:  Report given to PACU RN and Post -op Vital signs reviewed and stable  Post vital signs:  Reviewed and stable  Last Vitals:  Filed Vitals:   08/01/15 0541  BP: 139/72  Pulse: 99  Temp: 36.4 C  Resp: 18    Complications: No apparent anesthesia complications

## 2015-08-01 NOTE — Anesthesia Procedure Notes (Signed)
Procedure Name: Intubation Date/Time: 08/01/2015 7:35 AM Performed by: Anne Fu Pre-anesthesia Checklist: Patient identified, Emergency Drugs available, Suction available, Patient being monitored and Timeout performed Patient Re-evaluated:Patient Re-evaluated prior to inductionOxygen Delivery Method: Circle system utilized Preoxygenation: Pre-oxygenation with 100% oxygen Intubation Type: IV induction Ventilation: Mask ventilation without difficulty Laryngoscope Size: Mac and 4 Grade View: Grade II Tube type: Oral Tube size: 7.5 mm Number of attempts: 1 Airway Equipment and Method: Stylet Placement Confirmation: ETT inserted through vocal cords under direct vision,  positive ETCO2,  CO2 detector and breath sounds checked- equal and bilateral Secured at: 21 cm Tube secured with: Tape Dental Injury: Teeth and Oropharynx as per pre-operative assessment  Comments: DL X 2 with first attempt per [facility] Paramedic Student without success, second per CRNA with noted Grade II view bilat lung sounds post intubation, slight abrasion to left side of epiglottis opening on initial view on second attempt.

## 2015-08-01 NOTE — H&P (Signed)
Kristen Castillo is an 75 y.o. female.   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  She had surgery scheduled for April but developed pneumonia.  She has been adequately treated and has no clinical signs or symptoms of PNA.  CXRs are improved as well.   Past Medical History  Diagnosis Date  . Hematuria     in past  . Hair loss   . Unspecified vitamin D deficiency   . Migraines     in past  . Osteoporosis   . History of creation of ostomy     past partial colectomy in 05/03/14  . Complication of anesthesia   . PONV (postoperative nausea and vomiting)   . GERD (gastroesophageal reflux disease)   . Diverticulitis   . Kidney cysts   . Persistent cough   . History of skin cancer   . Deviated nasal septum   . Anxiety   . Cancer ([place])     hx skin cancer  . Ventral hernia   . Congestion of both ears   . Pneumonia     july 2016, pneumonia-06/2015   . Osteoarthritis     fingers   . Osteoporosis     Past Surgical History  Procedure Laterality Date  . Abdominal hysterectomy  1972  . Oophorectomy  1976  . Tonsillectomy    . Lipoma resection      left scapula area  . Reduction mammaplasty  1994  . Rhinoplasty  1985  . Facial cosmetic surgery  2009    Kristen Fray MD in [place]  . Laparoscopic partial colectomy N/A 05/03/2014    Procedure: DIAGNOSTIC LAPAROSCOPY WITH DRAINAGE OF INTRA ABDOMINAL ABSCESS PARTIAL COLECTOMY ;  Surgeon: [name], MD;  Location: [facility];  Service: General;  Laterality: N/A;  supine position  . Laparotomy N/A 05/03/2014    Procedure: EXPLORATORY LAPAROTOMY WITH HARTMAN PROCEDURE;  Surgeon: [name], MD;  Location: [facility];  Service: General;  Laterality: N/A;  . Ileo loop colostomy closure N/A 11/08/2014    Procedure:  LAPAROSCOPIC COLOSTOMY CLOSURE WITH PARTIAL COLECTOMY;  Surgeon: [name], MD;  Location: [facility];  Service: General;  Laterality: N/A;  . Laparoscopic lysis of adhesions N/A 11/08/2014    Procedure: LAPAROSCOPIC LYSIS OF ADHESIONS FOR 90 MINUTES;  Surgeon: [name], MD;  Location: [facility];  Service: General;  Laterality: N/A;    Family History  Problem Relation Age of Onset  . Arthritis Mother   . Colon cancer Mother 6  . Heart disease Father   . Dementia Father   . Diabetes Sister   . Cancer Maternal Grandfather     Lung cancer  . Stomach cancer Neg Hx    Social History:  reports that she has never smoked. She has never used smokeless tobacco. She reports that she does not drink alcohol or use illicit drugs.  Allergies:  Allergies  Allergen Reactions  . Codeine Nausea And Vomiting  . Dilaudid [Hydromorphone Hcl] Other (See Comments)    "too strong - I can't wake up"  . Sulfonamide Derivatives Other (See Comments)    Childhood allergy; reaction unknown  . Tape Other (See Comments)    REDDNESS, BLISTER/  Use paper tape    Medications Prior to Admission  Medication Sig Dispense  Refill  . benzonatate (TESSALON) 200 MG capsule Take 1 capsule (200 mg total) by mouth 2 (two) times daily as needed for cough. 20 capsule 0  . budesonide (RHINOCORT ALLERGY) 32 MCG/ACT nasal spray Place 1 spray into both nostrils daily.    . calcium carbonate (OS-CAL) 600 MG TABS tablet Take 600 mg by mouth 2 (two) times daily with a meal. Reported on 06/16/2015    . Cholecalciferol 1000 UNITS tablet Take 2,000 Units by mouth 2 (two) times daily. Reported on 07/03/2015    . diazepam (VALIUM) 5 MG tablet Take 2.5 mg by mouth every 6 (six) hours as needed for anxiety.    . Multiple Vitamin (MULTIVITAMIN) tablet Take 1 tablet by mouth daily.    Marland Kitchen OVER THE COUNTER MEDICATION guiafenesin - sudafed er 600mg /60 mg as needed    . pantoprazole (PROTONIX) 40 MG tablet Take 1 tablet (40 mg total) by mouth  daily. 30 tablet 6  . acetaminophen (TYLENOL) 325 MG tablet Take 650 mg by mouth every 6 (six) hours as needed.    . cefdinir (OMNICEF) 300 MG capsule Take 1 capsule (300 mg total) by mouth 2 (two) times daily. (Patient taking differently: Take 300 mg by mouth 2 (two) times daily. Completed on 07/10/2015) 20 capsule 0  . fluticasone (FLONASE) 50 MCG/ACT nasal spray Place into both nostrils daily.    . fluticasone furoate-vilanterol (BREO ELLIPTA) 100-25 MCG/INH AEPB Inhale 1 puff into the lungs daily. (Patient taking differently: Inhale 1 puff into the lungs daily. Completed on 07/10/2015) 1 each 5  . ondansetron (ZOFRAN ODT) 4 MG disintegrating tablet Take 1 tablet (4 mg total) by mouth every 6 (six) hours as needed for nausea or vomiting. 30 tablet 0  . Oxymetazoline HCl (AFRIN NASAL SPRAY NA) Place into the nose. As needed      No results found for this or any previous visit (from the past 48 hour(s)). No results found.  Review of Systems  Constitutional: Negative for fever and chills.  Respiratory: Negative for cough.   Gastrointestinal: Positive for abdominal pain.    Blood pressure 139/72, pulse 99, temperature 97.5 F (36.4 C), temperature source Oral, resp. rate 18, SpO2 97 %. Physical Exam  Constitutional: She appears well-developed and well-nourished. No distress.  HENT:  Head: Normocephalic and atraumatic.  Cardiovascular: Normal rate and regular rhythm.   Respiratory: Effort normal and breath sounds normal.  GI: Soft. There is no tenderness.  Lower midline scar with bulge to the right and in suprapubic area.  Left sided scar with lateral bulge.  Musculoskeletal: She exhibits no edema.  Neurological: She is alert.  Skin: Skin is warm and dry.     Assessment/Plan Ventral incisional hernias.  PNA has cleared.  Plan:  Laparoscopic possible open repair of hernias with mesh.  [name], MD 08/01/2015, 7:13 AM

## 2015-08-01 NOTE — Interval H&P Note (Signed)
History and Physical Interval Note:  08/01/2015 7:27 AM  Kristen Castillo  has presented today for surgery, with the diagnosis of ventral incisonal hernias  The various methods of treatment have been discussed with the patient and family. After consideration of risks, benefits and other options for treatment, the patient has consented to  Procedure(s): LAPAROSCOPIC POSSIBLE OPEN  VENTRAL INCISIONAL  HERNIA (N/A) INSERTION OF MESH (N/A) as a surgical intervention .  The patient's history has been reviewed, patient examined, no change in status, stable for surgery.  I have reviewed the patient's chart and labs.  Questions were answered to the patient's satisfaction.     [name] [name]

## 2015-08-01 NOTE — Discharge Instructions (Addendum)
[facility]  ABDOMINAL HERNIA REPAIR: POST OP INSTRUCTIONS  Always review your discharge instruction sheet given to you by the facility where your surgery was performed. IF YOU HAVE DISABILITY OR FAMILY LEAVE FORMS, YOU MUST BRING THEM TO THE OFFICE FOR PROCESSING.   DO NOT GIVE THEM TO YOUR DOCTOR.  1. A  prescription for pain medication may be given to you upon discharge.  Take your pain medication as prescribed, if needed.  If narcotic pain medicine is not needed, then you may take acetaminophen (Tylenol) or ibuprofen (Advil) as needed. 2. Take your usually prescribed medications unless otherwise directed. 3. If you need a refill on your pain medication, please contact your pharmacy.  They will contact our office to request authorization. Prescriptions will not be filled after 5 pm or on week-ends. 4. You should follow a light diet the first 24 hours after arrival home, such as soup and crackers, etc.  Be sure to include lots of fluids daily.  Resume your normal diet the day after surgery. 5. Most patients will experience some swelling and bruising around the umbilicus or in the groin and scrotum.  Ice packs and reclining will help.  Swelling and bruising can take several days to resolve.  6. It is common to experience some constipation if taking pain medication after surgery.  Increasing fluid intake and taking a stool softener (such as Colace) will usually help or prevent this problem from occurring.  A mild laxative (Milk of Magnesia or Miralax) should be taken according to package directions if there are no bowel movements after 48 hours. 7. Unless discharge instructions indicate otherwise, you may remove your bandages 72 hours after surgery, and you may shower at that time.  You may have steri-strips (small skin tapes) in place directly over the incision.  These strips should be left on the skin.  If your surgeon used skin glue on the incision, you may shower in 24  hours.  The glue will flake off over the next 2-3 weeks.  Any sutures or staples will be removed at the office during your follow-up visit. 8. ACTIVITIES:  You may resume regular (light) daily activities beginning the next day--such as daily self-care, walking, climbing stairs--gradually increasing activities as tolerated.  You may have sexual intercourse when it is comfortable.  Refrain from any heavy lifting or straining until approved by your doctor. a. You may drive when you are no longer taking prescription pain medication, you can comfortably wear a seatbelt, and you can safely maneuver your car and apply brakes. b. RETURN TO WORK:  __________________________________________________________ 9. You should see your doctor in the office for a follow-up appointment approximately 2-3 weeks after your surgery.  Make sure that you call for this appointment within a day or two after you arrive home to insure a convenient appointment time. 10. OTHER INSTRUCTIONS:  ___Wear abdominal binder._______________________________________________________________________________________________________________________________________________________________________________________  WHEN TO CALL YOUR DOCTOR: 1. Fever over 101.0 2. Inability to urinate 3. Nausea and/or vomiting 4. Extreme swelling or bruising 5. Continued bleeding from incision. 6. Increased pain, redness, or drainage from the incision  The clinic staff is available to answer your questions during regular business hours.  Please dont hesitate to call and ask to speak to one of the nurses for clinical concerns.  If you have a medical emergency, go to the nearest emergency room or call 911.  A surgeon from [facility] is always on call at the hospital   [address]  [address], [place], [place], [place]  [address] ?  [address], [place], [place]   [address] [phone] ? [phone] ? FAX [phone] Web site:  [email or web address]  Managing Pain  Pain after surgery or related to activity is often due to strain/injury to muscle, tendon, nerves and/or incisions.  This pain is usually short-term and will improve in a few months.   Many people find it helpful to do the following things TOGETHER to help speed the process of healing and to get back to regular activity more quickly:  1. Avoid heavy physical activity at first a. No lifting greater than 20 pounds at first, then increase to lifting as tolerated over the next few weeks b. Do not push through the pain.  Listen to your body and avoid positions and maneuvers than reproduce the pain.  Wait a few days before trying something more intense c. Walking is okay as tolerated, but go slowly and stop when getting sore.  If you can walk 30 minutes without stopping or pain, you can try more intense activity (running, jogging, aerobics, cycling, swimming, treadmill, sex, sports, weightlifting, etc ) d. Remember: If it hurts to do it, then dont do it!  2. Take Acetaminophen Anti-inflammatory medication i. Acetaminophen 500mg  tabs (Tylenol) 1-2 pills with every meal and just before bedtime (avoid if you have liver problems) ii. Take with food/snack around the clock for 1-2 weeks iii. This helps the muscle and nerve tissues become less irritable and calm down faster  3. Use a Heating pad or Ice/Cold Pack a. 4-6 times a day b. May use warm bath/hottub  or showers  4. Try Gentle Massage and/or Stretching  a. at the area of pain many times a day b. stop if you feel pain - do not overdo it  Try these steps together to help you body heal faster and avoid making things get worse.  Doing just one of these things may not be enough.    If you are not getting better after two weeks or are noticing you are getting worse, contact our office for further advice; we may need to re-evaluate you & see what other things we can do to help.  GETTING TO GOOD  BOWEL HEALTH. Irregular bowel habits such as constipation and diarrhea can lead to many problems over time.  Having one soft bowel movement a day is the most important way to prevent further problems.  The anorectal canal is designed to handle stretching and feces to safely manage our ability to get rid of solid waste (feces, poop, stool) out of our body.  BUT, hard constipated stools can act like ripping concrete bricks and diarrhea can be a burning fire to this very sensitive area of our body, causing inflamed hemorrhoids, anal fissures, increasing risk is perirectal abscesses, abdominal pain/bloating, an making irritable bowel worse.      The goal: ONE SOFT BOWEL MOVEMENT A DAY!  To have soft, regular bowel movements:   Drink plenty of fluids, consider 4-6 tall glasses of water a day.    Take plenty of fiber.  Fiber is the undigested part of plant food that passes into the colon, acting s natures broom to encourage bowel motility and movement.  Fiber can absorb and hold large amounts of water. This results in a larger, bulkier stool, which is soft and easier to pass. Work gradually over several weeks up to 6 servings a day of fiber (25g a day even more if needed) in the form  of: o Vegetables -- Root (potatoes, carrots, turnips), leafy green (lettuce, salad greens, celery, spinach), or cooked high residue (cabbage, broccoli, etc) o Fruit -- Fresh (unpeeled skin & pulp), Dried (prunes, apricots, cherries, etc ),  or stewed ( applesauce)  o Whole grain breads, pasta, etc (whole wheat)  o Bran cereals   Bulking Agents -- This type of water-retaining fiber generally is easily obtained each day by one of the following:  o Psyllium bran -- The psyllium plant is remarkable because its ground seeds can retain so much water. This product is available as Metamucil, Konsyl, Effersyllium, Per Diem Fiber, or the less expensive generic preparation in drug and health food stores. Although labeled a laxative, it  really is not a laxative.  o Methylcellulose -- This is another fiber derived from wood which also retains water. It is available as Citrucel. o Polyethylene Glycol - and artificial fiber commonly called Miralax or Glycolax.  It is helpful for people with gassy or bloated feelings with regular fiber o Flax Seed - a less gassy fiber than psyllium  No reading or other relaxing activity while on the toilet. If bowel movements take longer than 5 minutes, you are too constipated  AVOID CONSTIPATION.  High fiber and water intake usually takes care of this.  Sometimes a laxative is needed to stimulate more frequent bowel movements, but   Laxatives are not a good long-term solution as it can wear the colon out.  They can help jump-start bowels if constipated, but should be relied on constantly without discussing with your doctor o Osmotics (Milk of Magnesia, Fleets phosphosoda, Magnesium citrate, MiraLax, GoLytely) are safer than  o Stimulants (Senokot, Castor Oil, Dulcolax, Ex Lax)    o Avoid taking laxatives for more than 7 days in a row.   IF SEVERELY CONSTIPATED, try a Bowel Retraining Program: o Do not use laxatives.  o Eat a diet high in roughage, such as bran cereals and leafy vegetables.  o Drink six (6) ounces of prune or apricot juice each morning.  o Eat two (2) large servings of stewed fruit each day.  o Take one (1) heaping tablespoon of a psyllium-based bulking agent twice a day. Use sugar-free sweetener when possible to avoid excessive calories.  o Eat a normal breakfast.  o Set aside 15 minutes after breakfast to sit on the toilet, but do not strain to have a bowel movement.  o If you do not have a bowel movement by the third day, use an enema and repeat the above steps.   Controlling diarrhea o Switch to liquids and simpler foods for a few days to avoid stressing your intestines further. o Avoid dairy products (especially milk & ice cream) for a short time.  The intestines often  can lose the ability to digest lactose when stressed. o Avoid foods that cause gassiness or bloating.  Typical foods include beans and other legumes, cabbage, broccoli, and dairy foods.  Every person has some sensitivity to other foods, so listen to our body and avoid those foods that trigger problems for you. o Adding fiber (Citrucel, Metamucil, psyllium, Miralax) gradually can help thicken stools by absorbing excess fluid and retrain the intestines to act more normally.  Slowly increase the dose over a few weeks.  Too much fiber too soon can backfire and cause cramping & bloating. o Probiotics (such as active yogurt, Align, etc) may help repopulate the intestines and colon with normal bacteria and calm down a sensitive digestive tract.  Most studies show it to be of mild help, though, and such products can be costly. o Medicines: - Bismuth subsalicylate (ex. Kayopectate, Pepto Bismol) every 30 minutes for up to 6 doses can help control diarrhea.  Avoid if pregnant. - Loperamide (Immodium) can slow down diarrhea.  Start with two tablets (4mg  total) first and then try one tablet every 6 hours.  Avoid if you are having fevers or severe pain.  If you are not better or start feeling worse, stop all medicines and call your doctor for advice o Call your doctor if you are getting worse or not better.  Sometimes further testing (cultures, endoscopy, X-ray studies, bloodwork, etc) may be needed to help diagnose and treat the cause of the diarrhea.  TROUBLESHOOTING IRREGULAR BOWELS 1) Avoid extremes of bowel movements (no bad constipation/diarrhea) 2) Miralax 17gm mixed in 8oz. water or juice-daily. May use BID as needed.  3) Gas-x,Phazyme, etc. as needed for gas & bloating.  4) Soft,bland diet. No spicy,greasy,fried foods.  5) Prilosec over-the-counter as needed  6) May hold gluten/wheat products from diet to see if symptoms improve.  7)  May try probiotics (Align, Activa, etc) to help calm the bowels  down 7) If symptoms become worse call back immediately.  Hernia, Adult A hernia is the bulging of an organ or tissue through a weak spot in the muscles of the abdomen (abdominal wall). Hernias develop most often near the navel or groin. There are many kinds of hernias. Common kinds include:  Femoral hernia. This kind of hernia develops under the groin in the upper thigh area.  Inguinal hernia. This kind of hernia develops in the groin or scrotum.  Umbilical hernia. This kind of hernia develops near the navel.  Hiatal hernia. This kind of hernia causes part of the stomach to be pushed up into the chest.  Incisional hernia. This kind of hernia bulges through a scar from an abdominal surgery. CAUSES This condition may be caused by:  Heavy lifting.  Coughing over a long period of time.  Straining to have a bowel movement.  An incision made during an abdominal surgery.  A birth defect (congenital defect).  Excess weight or obesity.  Smoking.  Poor nutrition.  Cystic fibrosis.  Excess fluid in the abdomen.  Undescended testicles. SYMPTOMS Symptoms of a hernia include:  A lump on the abdomen. This is the first sign of a hernia. The lump may become more obvious with standing, straining, or coughing. It may get bigger over time if it is not treated or if the condition causing it is not treated.  Pain. A hernia is usually painless, but it may become painful over time if treatment is delayed. The pain is usually dull and may get worse with standing or lifting heavy objects. Sometimes a hernia gets tightly squeezed in the weak spot (strangulated) or stuck there (incarcerated) and causes additional symptoms. These symptoms may include:  Vomiting.  Nausea.  Constipation.  Irritability. DIAGNOSIS A hernia may be diagnosed with:  A physical exam. During the exam your health care provider may ask you to cough or to make a specific movement, because a hernia is usually more  visible when you move.  Imaging tests. These can include:  X-rays.  Ultrasound.  CT scan. TREATMENT A hernia that is small and painless may not need to be treated. A hernia that is large or painful may be treated with surgery. Inguinal hernias may be treated with surgery to prevent incarceration or strangulation. Strangulated  hernias are always treated with surgery, because lack of blood to the trapped organ or tissue can cause it to die. Surgery to treat a hernia involves pushing the bulge back into place and repairing the weak part of the abdomen. HOME CARE INSTRUCTIONS  Avoid straining.  Do not lift anything heavier than 10 lb (4.5 kg).  Lift with your leg muscles, not your back muscles. This helps avoid strain.  When coughing, try to cough gently.  Prevent constipation. Constipation leads to straining with bowel movements, which can make a hernia worse or cause a hernia repair to break down. You can prevent constipation by:  Eating a high-fiber diet that includes plenty of fruits and vegetables.  Drinking enough fluids to keep your urine clear or pale yellow. Aim to drink 6-8 glasses of water per day.  Using a stool softener as directed by your health care provider.  Lose weight, if you are overweight.  Do not use any tobacco products, including cigarettes, chewing tobacco, or electronic cigarettes. If you need help quitting, ask your health care provider.  Keep all follow-up visits as directed by your health care provider. This is important. Your health care provider may need to monitor your condition. SEEK MEDICAL CARE IF:  You have swelling, redness, and pain in the affected area.  Your bowel habits change. SEEK IMMEDIATE MEDICAL CARE IF:  You have a fever.  You have abdominal pain that is getting worse.  You feel nauseous or you vomit.  You cannot push the hernia back in place by gently pressing on it while you are lying down.  The hernia:  Changes in shape  or size.  Is stuck outside the abdomen.  Becomes discolored.  Feels hard or tender.   This information is not intended to replace advice given to you by your health care provider. Make sure you discuss any questions you have with your health care provider.   Document Released: 02/25/2005 Document Revised: 03/18/2014 Document Reviewed: 01/05/2014 Elsevier Interactive Patient Education [employer].

## 2015-08-01 NOTE — Op Note (Signed)
Operative Note  Kristen Castillo female 75 y.o. 08/01/2015  PREOPERATIVE DX:  Ventral incisional hernias  POSTOPERATIVE DX:  Same  PROCEDURE:   Laparoscopic lysis of adhesions for 1 hour. Laparoscopic repair of ventral incisional hernias with mesh (Ventralite ST 25.4 cm by 33 cm)         Surgeon: [name]   Assistants: Ralene Ok M.D.  Anesthesia: General endotracheal anesthesia  Indications:   This is a 75 year old female who underwent an urgent Hartmann procedure for complicated diverticulitis in the past. She subsequently had colostomy closure. She is now developed multiple incisional hernias-1 around the left-sided colostomy site, 2 in the lower midline incision that are palpable. She presents for laparoscopic possible open repair with mesh.    Procedure Detail:  She was brought to the operating room placed supine on the operating table and a general anesthetic given. A Foley catheter was inserted. Here in the suprapubic region was clipped. The abdominal wall and groin areas were widely sterilely prepped and draped. A timeout was performed.  She was placed in slight reverse Trendelenburg position. A 5 mm incision was made in the right upper quadrant subcostal region. Using a 5 mm Optiview trocar and laparoscope, access was gained into the peritoneal cavity and a pneumoperitoneum was created. Inspection of the area under the trocar demonstrated no evidence of organ injury or bleeding.  Multiple adhesions were noted between the omentum and anterior abdominal wall in the midline, right lower quadrant, left mid abdomen, left lower quadrant. A 5 mm trocar  Was placed in the medial right upper quadrant.  Adhesions sharp and blunt dissection, I began taking down the adhesions in the right lower quadrant area in the midline area. These were all omental adhesions. This allowed me to add a third trocar in the right mid lateral abdomen. A fourth trocar was placed  In the left upper  quadrant subcostal region. I continued to perform lysis of adhesions between the omentum and lower midline and multiple hernia defects were noted including one in the suprapubic region  And the one at the colostomy site. I mobilize adhesions from the abdominal wall around the colostomy site hernia,  As well as a left lower quadrant area. I then expose Cooper's ligament by stripping the peritoneum from it  And dropping the peritoneum down into the pelvic area avoiding the bladder. At this point, all the adhesions been taken down from the abdominal wall which took approximately 1 hour. I then mobilized the falciform ligament off the abdominal wall.  I inspected all areas of the abdomen and there is no evidence of organ injury or bleeding. Using a spinal needle, identified the  Peripheries of the hernia and marked 4-5 cm away from these. The Ventralite ST mesh was brought into the field and sent part of it was trimmed away. 8 anchoring sutures of #1 Novafil were placed around the periphery of the mesh. The rough side of the mesh was marked. The mesh was hydrated.  The right upper quadrant medial trocar was then replaced with a 12 mm trocar. The mesh was then inserted into the peritoneal cavity, under laparoscopic vision, through the 12 mm trocar and deployed so that the non-adherent side was facing the viscera and the rough side was facing the abdominal wall. 8 small incisions were then placed in the abdominal wall and the 8 anchoring sutures were brought up across a fascial bridge at these sites and tied down initially anchoring the mesh to the  the  abdominal wall.  I then used the spiral tacker to further anchor the mesh to Cooper's ligament inferiorly and the abdominal wall circumferentially. This provided good coverage and good overlap of all hernia defects. There were no gaps.  Following this, a 4 quadrant and central inspection were performed. There is no evidence of organ injury or bleeding. The 12 mm trocar  was removed and the fascial defect closed with 0 Vicryl suture. The CO2 gas was released and the remaining trocars removed.  The skin incisions were closed with 4-0 Monocryl subcuticular stitches. Steri-Strips and sterile dressings were applied.  An abdominal binder was applied.  She tolerated the procedure well  Without any apparent complications and was taken to the recovery room in satisfactory condition. Estimated blood loss was approximately 100 cc.         Complications:  * No complications entered in OR log *         Disposition: PACU - hemodynamically stable.         Condition: stable

## 2015-08-01 NOTE — Anesthesia Preprocedure Evaluation (Signed)
Anesthesia Evaluation  Patient identified by MRN, date of birth, ID band Patient awake    Reviewed: Allergy & Precautions, NPO status , Patient's Chart, lab work & pertinent test results  History of Anesthesia Complications (+) PONV  Airway Mallampati: I  TM Distance: >3 FB Neck ROM: Full    Dental   Pulmonary    Pulmonary exam normal        Cardiovascular Normal cardiovascular exam     Neuro/Psych Anxiety    GI/Hepatic   Endo/Other    Renal/GU      Musculoskeletal   Abdominal   Peds  Hematology   Anesthesia Other Findings   Reproductive/Obstetrics                             Anesthesia Physical Anesthesia Plan  ASA: II  Anesthesia Plan: General   Post-op Pain Management:    Induction: Intravenous  Airway Management Planned: Oral ETT  Additional Equipment:   Intra-op Plan:   Post-operative Plan: Extubation in OR  Informed Consent: I have reviewed the patients History and Physical, chart, labs and discussed the procedure including the risks, benefits and alternatives for the proposed anesthesia with the patient or authorized representative who has indicated his/her understanding and acceptance.     Plan Discussed with: CRNA and Surgeon  Anesthesia Plan Comments:         Anesthesia Quick Evaluation

## 2015-08-02 DIAGNOSIS — K432 Incisional hernia without obstruction or gangrene: Secondary | ICD-10-CM | POA: Diagnosis not present

## 2015-08-02 DIAGNOSIS — K219 Gastro-esophageal reflux disease without esophagitis: Secondary | ICD-10-CM | POA: Diagnosis not present

## 2015-08-02 LAB — BASIC METABOLIC PANEL
ANION GAP: 3 — AB (ref 5–15)
BUN: 13 mg/dL (ref 6–20)
CHLORIDE: 111 mmol/L (ref 101–111)
CO2: 24 mmol/L (ref 22–32)
Calcium: 7.9 mg/dL — ABNORMAL LOW (ref 8.9–10.3)
Creatinine, Ser: 0.98 mg/dL (ref 0.44–1.00)
GFR calc Af Amer: >60 mL/min (ref >60)
GFR, EST NON AFRICAN AMERICAN: 55 mL/min — AB (ref >60)
GLUCOSE: 137 mg/dL — AB (ref 65–99)
POTASSIUM: 4.7 mmol/L (ref 3.5–5.1)
Sodium: 138 mmol/L (ref 135–145)

## 2015-08-02 LAB — CBC
HEMATOCRIT: 34.9 % — AB (ref 36.0–46.0)
HEMOGLOBIN: 11.2 g/dL — AB (ref 12.0–15.0)
MCH: 29 pg (ref 26.0–34.0)
MCHC: 32.1 g/dL (ref 30.0–36.0)
MCV: 90.4 fL (ref 78.0–100.0)
Platelets: 255 10*3/uL (ref 150–400)
RBC: 3.86 MIL/uL — ABNORMAL LOW (ref 3.87–5.11)
RDW: 14.4 % (ref 11.5–15.5)
WBC: 8.5 10*3/uL (ref 4.0–10.5)

## 2015-08-02 MED ORDER — PANTOPRAZOLE SODIUM 40 MG PO TBEC
40.0000 mg | DELAYED_RELEASE_TABLET | Freq: Two times a day (BID) | ORAL | Status: DC
  Administered 2015-08-02 – 2015-08-06 (×9): 40 mg via ORAL
  Filled 2015-08-02 (×10): qty 1

## 2015-08-02 MED ORDER — DOCUSATE SODIUM 100 MG PO CAPS
100.0000 mg | ORAL_CAPSULE | Freq: Two times a day (BID) | ORAL | Status: DC
  Administered 2015-08-02 – 2015-08-06 (×9): 100 mg via ORAL
  Filled 2015-08-02 (×10): qty 1

## 2015-08-02 NOTE — Care Management Obs Status (Signed)
[facility] NOTIFICATION   Patient Details  Name: Kristen Castillo MRN: [id number] Date of Birth: [birth year]   Medicare Observation Status Notification Given:  Yes    Guadalupe Maple, RN 08/02/2015, 1:12 PM

## 2015-08-02 NOTE — Progress Notes (Signed)
1 Day Post-Op  Subjective: Sore on right side when she moves.  Some nausea with Morphine.  Objective: Vital signs in last 24 hours: Temp:  [97.6 F (36.4 C)-99.9 F (37.7 C)] 98.3 F (36.8 C) (05/24 0543) Pulse Rate:  [64-86] 68 (05/24 0543) Resp:  [15-20] 16 (05/24 0543) BP: (108-140)/(43-71) 111/46 mmHg (05/24 0543) SpO2:  [97 %-100 %] 99 % (05/24 0543) FiO2 (%):  [6 %] 6 % (05/23 1018) Weight:  [56.7 kg (125 lb)] 56.7 kg (125 lb) (05/23 1236) Last BM Date: 08/01/15  Intake/Output from previous day: 05/23 0701 - 05/24 0700 In: 3395 [P.O.:840; I.V.:2555] Out: 2000 [Urine:1850; Blood:150] Intake/Output this shift:    PE: General- In NAD Abdomen-soft, dressings dry, few bowel sounds  Lab Results:   Recent Labs  08/02/15 0451  WBC 8.5  HGB 11.2*  HCT 34.9*  PLT 255   BMET  Recent Labs  08/02/15 0451  NA 138  K 4.7  CL 111  CO2 24  GLUCOSE 137*  BUN 13  CREATININE 0.98  CALCIUM 7.9*   PT/INR No results for input(s): LABPROT, INR in the last 72 hours. Comprehensive Metabolic Panel:    Component Value Date/Time   NA 138 08/02/2015 0451   NA 138 07/26/2015 1140   K 4.7 08/02/2015 0451   K 3.9 07/26/2015 1140   CL 111 08/02/2015 0451   CL 107 07/26/2015 1140   CO2 24 08/02/2015 0451   CO2 26 07/26/2015 1140   BUN 13 08/02/2015 0451   BUN 35* 07/26/2015 1140   CREATININE 0.98 08/02/2015 0451   CREATININE 0.95 07/26/2015 1140   GLUCOSE 137* 08/02/2015 0451   GLUCOSE 116* 07/26/2015 1140   CALCIUM 7.9* 08/02/2015 0451   CALCIUM 9.5 07/26/2015 1140   AST 15 06/29/2015 1205   AST 14 01/19/2015 1527   ALT 11* 06/29/2015 1205   ALT 8 01/19/2015 1527   ALKPHOS 80 06/29/2015 1205   ALKPHOS 73 01/19/2015 1527   BILITOT 0.4 06/29/2015 1205   BILITOT 0.4 01/19/2015 1527   PROT 7.9 06/29/2015 1205   PROT 7.3 01/19/2015 1527   ALBUMIN 3.7 06/29/2015 1205   ALBUMIN 3.9 01/19/2015 1527   ALBUMIN 3.9 01/19/2015 1527     Studies/Results: No results  found.  Anti-infectives: Anti-infectives    Start     Dose/Rate Route Frequency Ordered Stop   08/01/15 1600  ceFAZolin (ANCEF) IVPB 2g/100 mL premix     2 g 200 mL/hr over 30 Minutes Intravenous Every 8 hours 08/01/15 1140 08/01/15 1857   08/01/15 1400  ceFAZolin (ANCEF) IVPB 2g/100 mL premix  Status:  Discontinued     2 g 200 mL/hr over 30 Minutes Intravenous Every 8 hours 08/01/15 1135 08/01/15 1140   08/01/15 0559  ceFAZolin (ANCEF) IVPB 2g/100 mL premix     2 g 200 mL/hr over 30 Minutes Intravenous On call to O.R. 08/01/15 0559 08/01/15 0745      Assessment Principal Problem:   Ventral hernia s/p laparoscopic repair with mesh 08/01/15-stable overnight Active Problems:   GERD (gastroesophageal reflux disease)-on BID Protonix at home      Plan: Change Protonix to BID.  Decrease IVF.  Start Colace.  Ambulate.  Remove foley tomorrow.   Kristen Castillo J 08/02/2015

## 2015-08-03 DIAGNOSIS — K432 Incisional hernia without obstruction or gangrene: Secondary | ICD-10-CM | POA: Diagnosis not present

## 2015-08-03 MED ORDER — HYDROCODONE-ACETAMINOPHEN 5-325 MG PO TABS
1.0000 | ORAL_TABLET | ORAL | Status: DC | PRN
  Administered 2015-08-03 – 2015-08-04 (×3): 2 via ORAL
  Filled 2015-08-03 (×3): qty 2

## 2015-08-03 MED ORDER — DIPHENHYDRAMINE HCL 25 MG PO CAPS
25.0000 mg | ORAL_CAPSULE | Freq: Four times a day (QID) | ORAL | Status: DC | PRN
  Administered 2015-08-03 – 2015-08-04 (×4): 25 mg via ORAL
  Filled 2015-08-03 (×4): qty 1

## 2015-08-03 NOTE — Progress Notes (Signed)
2 Days Post-Op  Subjective: Pain is better.  Tolerating liquids.  No flatus or BM.  Has voided.  Objective: Vital signs in last 24 hours: Temp:  [98.4 F (36.9 C)-99.4 F (37.4 C)] 99.4 F (37.4 C) (05/25 0552) Pulse Rate:  [66-79] 76 (05/25 0552) Resp:  [16] 16 (05/25 0552) BP: (103-124)/(43-53) 111/46 mmHg (05/25 0552) SpO2:  [96 %-98 %] 97 % (05/25 0552) Last BM Date: 08/01/15  Intake/Output from previous day: 05/24 0701 - 05/25 0700 In: 2292.5 [P.O.:460; I.V.:1832.5] Out: 1900 [Urine:1900] Intake/Output this shift:    PE: General- In NAD Abdomen-soft, dressings dry, few bowel sounds  Lab Results:   Recent Labs  08/02/15 0451  WBC 8.5  HGB 11.2*  HCT 34.9*  PLT 255   BMET  Recent Labs  08/02/15 0451  NA 138  K 4.7  CL 111  CO2 24  GLUCOSE 137*  BUN 13  CREATININE 0.98  CALCIUM 7.9*   PT/INR No results for input(s): LABPROT, INR in the last 72 hours. Comprehensive Metabolic Panel:    Component Value Date/Time   NA 138 08/02/2015 0451   NA 138 07/26/2015 1140   K 4.7 08/02/2015 0451   K 3.9 07/26/2015 1140   CL 111 08/02/2015 0451   CL 107 07/26/2015 1140   CO2 24 08/02/2015 0451   CO2 26 07/26/2015 1140   BUN 13 08/02/2015 0451   BUN 35* 07/26/2015 1140   CREATININE 0.98 08/02/2015 0451   CREATININE 0.95 07/26/2015 1140   GLUCOSE 137* 08/02/2015 0451   GLUCOSE 116* 07/26/2015 1140   CALCIUM 7.9* 08/02/2015 0451   CALCIUM 9.5 07/26/2015 1140   AST 15 06/29/2015 1205   AST 14 01/19/2015 1527   ALT 11* 06/29/2015 1205   ALT 8 01/19/2015 1527   ALKPHOS 80 06/29/2015 1205   ALKPHOS 73 01/19/2015 1527   BILITOT 0.4 06/29/2015 1205   BILITOT 0.4 01/19/2015 1527   PROT 7.9 06/29/2015 1205   PROT 7.3 01/19/2015 1527   ALBUMIN 3.7 06/29/2015 1205   ALBUMIN 3.9 01/19/2015 1527   ALBUMIN 3.9 01/19/2015 1527     Studies/Results: No results found.  Anti-infectives: Anti-infectives    Start     Dose/Rate Route Frequency Ordered Stop   08/01/15 1600  ceFAZolin (ANCEF) IVPB 2g/100 mL premix     2 g 200 mL/hr over 30 Minutes Intravenous Every 8 hours 08/01/15 1140 08/01/15 1857   08/01/15 1400  ceFAZolin (ANCEF) IVPB 2g/100 mL premix  Status:  Discontinued     2 g 200 mL/hr over 30 Minutes Intravenous Every 8 hours 08/01/15 1135 08/01/15 1140   08/01/15 0559  ceFAZolin (ANCEF) IVPB 2g/100 mL premix     2 g 200 mL/hr over 30 Minutes Intravenous On call to O.R. 08/01/15 0559 08/01/15 0745      Assessment Principal Problem:   Ventral hernia s/p laparoscopic repair with mesh 08/01/15-more mobile; no bowel function yet Active Problems:   GERD (gastroesophageal reflux disease)-back on BID Protonix      Plan: Advance to solid diet.  Oral analgesic (Norco).   Kamy Poinsett [name] 08/03/2015

## 2015-08-04 DIAGNOSIS — K432 Incisional hernia without obstruction or gangrene: Secondary | ICD-10-CM | POA: Diagnosis not present

## 2015-08-04 MED ORDER — TRAMADOL HCL 50 MG PO TABS
50.0000 mg | ORAL_TABLET | ORAL | Status: DC | PRN
  Administered 2015-08-04 – 2015-08-06 (×7): 50 mg via ORAL
  Filled 2015-08-04 (×7): qty 1

## 2015-08-04 MED ORDER — DIPHENHYDRAMINE HCL 25 MG PO CAPS
25.0000 mg | ORAL_CAPSULE | ORAL | Status: DC | PRN
  Administered 2015-08-04 – 2015-08-06 (×3): 25 mg via ORAL
  Filled 2015-08-04 (×3): qty 1

## 2015-08-04 MED ORDER — MAGNESIUM HYDROXIDE 400 MG/5ML PO SUSP
30.0000 mL | Freq: Every day | ORAL | Status: DC
  Administered 2015-08-04 – 2015-08-06 (×3): 30 mL via ORAL
  Filled 2015-08-04 (×3): qty 30

## 2015-08-04 MED ORDER — BISACODYL 10 MG RE SUPP
10.0000 mg | Freq: Once | RECTAL | Status: AC
  Administered 2015-08-04: 10 mg via RECTAL
  Filled 2015-08-04: qty 1

## 2015-08-04 MED ORDER — IBUPROFEN 200 MG PO TABS: 600.0000 mg | ORAL_TABLET | Freq: Four times a day (QID) | ORAL | Status: DC | PRN

## 2015-08-04 MED ORDER — SODIUM CHLORIDE 0.9% FLUSH: 3.0000 mL | INTRAVENOUS | Status: DC | PRN

## 2015-08-04 MED ORDER — SODIUM CHLORIDE 0.9% FLUSH
3.0000 mL | Freq: Three times a day (TID) | INTRAVENOUS | Status: DC
  Administered 2015-08-04 – 2015-08-06 (×6): 3 mL via INTRAVENOUS

## 2015-08-04 NOTE — Progress Notes (Signed)
3 Days Post-Op  Subjective: Developed itching and a rash after taking Norco.  Tolerating solid diet.  No flatus or BM.  Objective: Vital signs in last 24 hours: Temp:  [97.8 F (36.6 C)-98 F (36.7 C)] 98 F (36.7 C) (05/26 0627) Pulse Rate:  [67-76] 76 (05/26 0627) Resp:  [15-16] 15 (05/26 0627) BP: (110-143)/(44-65) 113/55 mmHg (05/26 0627) SpO2:  [96 %-98 %] 96 % (05/26 0627) Last BM Date: 08/01/15  Intake/Output from previous day: 05/25 0701 - 05/26 0700 In: 1630 [P.O.:580; I.V.:1050] Out: 950 [Urine:950] Intake/Output this shift:    PE: General- In NAD Abdomen-soft, dressings dry, hypoactive bowel sounds  Lab Results:   Recent Labs  08/02/15 0451  WBC 8.5  HGB 11.2*  HCT 34.9*  PLT 255   BMET  Recent Labs  08/02/15 0451  NA 138  K 4.7  CL 111  CO2 24  GLUCOSE 137*  BUN 13  CREATININE 0.98  CALCIUM 7.9*   PT/INR No results for input(s): LABPROT, INR in the last 72 hours. Comprehensive Metabolic Panel:    Component Value Date/Time   NA 138 08/02/2015 0451   NA 138 07/26/2015 1140   K 4.7 08/02/2015 0451   K 3.9 07/26/2015 1140   CL 111 08/02/2015 0451   CL 107 07/26/2015 1140   CO2 24 08/02/2015 0451   CO2 26 07/26/2015 1140   BUN 13 08/02/2015 0451   BUN 35* 07/26/2015 1140   CREATININE 0.98 08/02/2015 0451   CREATININE 0.95 07/26/2015 1140   GLUCOSE 137* 08/02/2015 0451   GLUCOSE 116* 07/26/2015 1140   CALCIUM 7.9* 08/02/2015 0451   CALCIUM 9.5 07/26/2015 1140   AST 15 06/29/2015 1205   AST 14 01/19/2015 1527   ALT 11* 06/29/2015 1205   ALT 8 01/19/2015 1527   ALKPHOS 80 06/29/2015 1205   ALKPHOS 73 01/19/2015 1527   BILITOT 0.4 06/29/2015 1205   BILITOT 0.4 01/19/2015 1527   PROT 7.9 06/29/2015 1205   PROT 7.3 01/19/2015 1527   ALBUMIN 3.7 06/29/2015 1205   ALBUMIN 3.9 01/19/2015 1527   ALBUMIN 3.9 01/19/2015 1527     Studies/Results: No results found.  Anti-infectives: Anti-infectives    Start     Dose/Rate Route  Frequency Ordered Stop   08/01/15 1600  ceFAZolin (ANCEF) IVPB 2g/100 mL premix     2 g 200 mL/hr over 30 Minutes Intravenous Every 8 hours 08/01/15 1140 08/01/15 1857   08/01/15 1400  ceFAZolin (ANCEF) IVPB 2g/100 mL premix  Status:  Discontinued     2 g 200 mL/hr over 30 Minutes Intravenous Every 8 hours 08/01/15 1135 08/01/15 1140   08/01/15 0559  ceFAZolin (ANCEF) IVPB 2g/100 mL premix     2 g 200 mL/hr over 30 Minutes Intravenous On call to O.R. 08/01/15 0559 08/01/15 0745      Assessment Principal Problem:   Ventral hernia s/p laparoscopic repair with mesh 08/01/15-allergic reaction to Norco; some ileus Active Problems:   GERD (gastroesophageal reflux disease)-back on BID Protonix      Plan: Stop Norco and change to Tramadol.  Add Ibuprofen.  Start MOM.  Dulcolax suppository.   Kristen Castillo 08/04/2015

## 2015-08-05 DIAGNOSIS — K432 Incisional hernia without obstruction or gangrene: Secondary | ICD-10-CM | POA: Diagnosis not present

## 2015-08-05 NOTE — Progress Notes (Signed)
Patient ID: Kristen Castillo, female   DOB: June 18, 1940, 75 y.o.   MRN: [id number] [facility] [place] Surgery Progress Note:   4 Days Post-Op  Subjective: Mental status is alert.  She has a nurse helping her.  Rash has developed on abdomen that is photographed in EPIC.  It looks like the prep since there are linear red lines in the breast creases.   Objective: Vital signs in last 24 hours: Temp:  [98.2 F (36.8 C)-98.6 F (37 C)] 98.6 F (37 C) (05/27 0542) Pulse Rate:  [80-102] 80 (05/27 0542) Resp:  [14-16] 14 (05/27 0542) BP: (132-141)/(61-77) 132/64 mmHg (05/27 0542) SpO2:  [90 %-95 %] 90 % (05/27 0542)  Intake/Output from previous day: 05/26 0701 - 05/27 0700 In: 605 [P.O.:600; I.V.:5] Out: 1050 [Urine:1050] Intake/Output this shift:    Physical Exam: Work of breathing is not labored.  See photo of abdominal rash.  No flatus yet  Lab Results:  No results found for this or any previous visit (from the past 48 hour(s)).  Radiology/Results: No results found.  Anti-infectives: Anti-infectives    Start     Dose/Rate Route Frequency Ordered Stop   08/01/15 1600  ceFAZolin (ANCEF) IVPB 2g/100 mL premix     2 g 200 mL/hr over 30 Minutes Intravenous Every 8 hours 08/01/15 1140 08/01/15 1857   08/01/15 1400  ceFAZolin (ANCEF) IVPB 2g/100 mL premix  Status:  Discontinued     2 g 200 mL/hr over 30 Minutes Intravenous Every 8 hours 08/01/15 1135 08/01/15 1140   08/01/15 0559  ceFAZolin (ANCEF) IVPB 2g/100 mL premix     2 g 200 mL/hr over 30 Minutes Intravenous On call to O.R. 08/01/15 0559 08/01/15 0745      Assessment/Plan: Problem List: Patient Active Problem List   Diagnosis Date Noted  . Ventral hernia s/p laparoscopic repair with mesh 08/01/15 08/01/2015  . Elevated WBC count 07/05/2015  . Pneumonia 07/03/2015  . Acute upper respiratory infection 06/27/2015  . Hernia of abdominal wall 01/25/2015  . Abdominal pain, epigastric 10/04/2014  . Stress 10/04/2014  . CAP  (community acquired pneumonia) 10/04/2014  . Moderate malnutrition ([place]) 05/24/2014  . Anemia 05/24/2014  . Mild malnutrition ([place]) 05/08/2014  . Abdominal pain   . Dehydration   . Migraine without aura and without status migrainosus, not intractable   . Sigmoid diverticulitis 05/03/2014  . Difficulty tolerating colonoscopy bowel prep 04/29/2014  . Anxiety, mild 04/29/2014  . 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 Diverticulitis of colon with perforation 04/28/2014  . GERD (gastroesophageal reflux disease) 04/28/2014  . Cerumen impaction 03/21/2014  . Cough 03/21/2014  . Well adult exam 03/21/2014  . 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 Rectal discharge 09/07/2011  . Functional constipation 09/07/2011  . Nephrolithiasis 01/08/2011  . Diverticulosis of colon 01/08/2011  . HEMATURIA, MICROSCOPIC, HX OF 01/28/2008  . Vitamin D deficiency 04/01/2007  . HAIR LOSS 04/01/2007  . TREMOR 04/01/2007  . Allergic rhinitis 03/31/2007  . OSTEOARTHRITIS 03/31/2007  . MIGRAINES, HX OF 03/31/2007  . Osteoporosis 11/12/2006    Ileus after lap ventral hernia repair.  Rash on abdomen likely related to prep.   4 Days [place] Hassell Done, MD, [facility] [phone] beeper [phone]  08/05/2015 10:03 AM

## 2015-08-06 DIAGNOSIS — K432 Incisional hernia without obstruction or gangrene: Secondary | ICD-10-CM | POA: Diagnosis not present

## 2015-08-06 NOTE — Progress Notes (Signed)
Patient ID: Kristen Castillo, female   DOB: 26-Mar-1940, 75 y.o.   MRN: [id number] [facility] Surgery Progress Note:   5 Days Post-Op  Subjective: Mental status is clear.  Feeling better today.   Objective: Vital signs in last 24 hours: Temp:  [97.5 F (36.4 C)-98.6 F (37 C)] 98.6 F (37 C) (05/28 [id number]) Pulse Rate:  [82-83] 83 (05/28 0623) Resp:  [16] 16 (05/28 0623) BP: (118-130)/(57-75) 118/57 mmHg (05/28 0623) SpO2:  [93 %-95 %] 94 % (05/28 0623)  Intake/Output from previous day: 05/27 0701 - 05/28 0700 In: 480 [P.O.:480] Out: -  Intake/Output this shift:    Physical Exam: Work of breathing is normal. Incision photographed again and redness looks like rxn to Chloroprep  Lab Results:  No results found for this or any previous visit (from the past 48 hour(s)).  Radiology/Results: No results found.  Anti-infectives: Anti-infectives    Start     Dose/Rate Route Frequency Ordered Stop   08/01/15 1600  ceFAZolin (ANCEF) IVPB 2g/100 mL premix     2 g 200 mL/hr over 30 Minutes Intravenous Every 8 hours 08/01/15 1140 08/01/15 1857   08/01/15 1400  ceFAZolin (ANCEF) IVPB 2g/100 mL premix  Status:  Discontinued     2 g 200 mL/hr over 30 Minutes Intravenous Every 8 hours 08/01/15 1135 08/01/15 1140   08/01/15 0559  ceFAZolin (ANCEF) IVPB 2g/100 mL premix     2 g 200 mL/hr over 30 Minutes Intravenous On call to O.R. 08/01/15 0559 08/01/15 0745      Assessment/Plan: Problem List: Patient Active Problem List   Diagnosis Date Noted  . Ventral hernia s/p laparoscopic repair with mesh 08/01/15 08/01/2015  . Elevated WBC count 07/05/2015  . Pneumonia 07/03/2015  . Acute upper respiratory infection 06/27/2015  . Hernia of abdominal wall 01/25/2015  . Abdominal pain, epigastric 10/04/2014  . Stress 10/04/2014  . CAP (community acquired pneumonia) 10/04/2014  . Moderate malnutrition ([place]) 05/24/2014  . Anemia 05/24/2014  . Mild malnutrition ([place]) 05/08/2014  . Abdominal  pain   . Dehydration   . Migraine without aura and without status migrainosus, not intractable   . Sigmoid diverticulitis 05/03/2014  . Difficulty tolerating colonoscopy bowel prep 04/29/2014  . Anxiety, mild 04/29/2014  . 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 Diverticulitis of colon with perforation 04/28/2014  . GERD (gastroesophageal reflux disease) 04/28/2014  . Cerumen impaction 03/21/2014  . Cough 03/21/2014  . Well adult exam 03/21/2014  . 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 Rectal discharge 09/07/2011  . Functional constipation 09/07/2011  . Nephrolithiasis 01/08/2011  . Diverticulosis of colon 01/08/2011  . HEMATURIA, MICROSCOPIC, HX OF 01/28/2008  . Vitamin D deficiency 04/01/2007  . HAIR LOSS 04/01/2007  . TREMOR 04/01/2007  . Allergic rhinitis 03/31/2007  . OSTEOARTHRITIS 03/31/2007  . MIGRAINES, HX OF 03/31/2007  . Osteoporosis 11/12/2006    Doing well.  Having BMs and tolerating diet.  Will possibly discharge this afternoon.  Handwritten script for Tramadol given.   5 Days [place] Hassell Done, MD, [facility] [phone] beeper [phone]  08/06/2015 8:55 AM

## 2015-08-11 NOTE — Discharge Summary (Signed)
Physician Discharge Summary  Patient ID: Kristen Castillo MRN: [id number] DOB/AGE: [birth year] 75 y.o.  Admit date: 08/01/2015 Discharge date: 08/06/2015  Admission Diagnoses:  Ventral incisional hernias  Discharge Diagnoses:  Principal Problem:   Ventral hernia s/p laparoscopic repair with mesh 08/01/15 Active Problems:   GERD (gastroesophageal reflux disease)   Ileus  Discharged Condition: good  Hospital Course: She was admitted and underwent the above procedure.  Postoperatively, she developed an ileus that eventually resolved.  She was able to be discharged on POD #5.  Discharge instructions were given to her.   Discharge Exam: Blood pressure 103/52, pulse 86, temperature 98.6 F (37 C), temperature source Oral, resp. rate 15, height 5' 2.25" (1.581 m), weight 56.7 kg (125 lb), SpO2 92 %.   Disposition: 01-Home or Self Care  Discharge Instructions    Call MD for:  extreme fatigue    Complete by:  As directed      Call MD for:  hives    Complete by:  As directed      Call MD for:  persistant nausea and vomiting    Complete by:  As directed      Call MD for:  redness, tenderness, or signs of infection (pain, swelling, redness, odor or green/yellow discharge around incision site)    Complete by:  As directed      Call MD for:  severe uncontrolled pain    Complete by:  As directed      Call MD for:    Complete by:  As directed   Temperature > 101.71F     Diet - low sodium heart healthy    Complete by:  As directed   Start with bland, low residue diet for a few days, then advance to a heart healthy (low fat, high fiber) diet.  If you feel nauseated or constipated, simplify to a liquid only diet for 48 hours until you are feeling better (no more nausea, farting/passing gas, having a bowel movement, etc...).  If you cannot tolerate even drinking liquids, or feeling worse, let your surgeon know or go to the Emergency Department for help.     Discharge instructions    Complete by:   As directed   Please see discharge instruction sheets.   Also refer to any handouts/printouts that may have been given from the [facility] surgery office (if you visited [name] there before surgery) Please call our office if you have any questions or concerns [phone]     Discharge wound care:    Complete by:  As directed   If you have closed incisions: Shower and bathe over these incisions with soap and water every day.  It is OK to wash over the dressings: they are waterproof. Remove all surgical dressings on postoperative day #3.  You do not need to replace dressings over the closed incisions unless you feel more comfortable with a Band-Aid covering it.   If you have an open wound: That requires packing, so please see wound care instructions.   In general, remove all dressings, wash wound with soap and water and then replace with saline moistened gauze.  Do the dressing change at least every day.    Please call our office [phone] if you have further questions.     Driving Restrictions    Complete by:  As directed   No driving until off narcotics and can safely swerve away without pain during an emergency     Increase activity slowly  Complete by:  As directed   Walk an hour a day.  Use 20-30 minute walks.  When you can walk 30 minutes without difficulty, it is fine to restart low impact/moderate activities such as biking, jogging, swimming, sexual activity, etc.  Eventually you can increase to unrestricted activity when not feeling pain.  If you feel pain: STOP!Marland Kitchen   Let pain protect you from overdoing it.  Use ice/heat & over-the-counter pain medications to help minimize soreness.  If that is not enough, then use your narcotic pain prescription as needed to remain active.  It is better to take extra pain medications and be more active than to stay bedridden to avoid all pain medications.     Lifting restrictions    Complete by:  As directed   Avoid heavy lifting initially, <20 pounds at  first.   Do not push through pain.   You have no specific weight limit: If it hurts to do, DON'T DO IT.    If you feel no pain, you are not injuring anything.  Pain will protect you from injury.   Coughing and sneezing are far more stressful to your incision than any lifting.   Avoid resuming heavy lifting (>50 pounds) or other intense activity until off all narcotic pain medications.   When want to exercise more, give yourself 2 weeks to gradually get back to full intense exercise/activity.     May shower / Bathe    Complete by:  As directed   [place].  It is fine for dressings or wounds to be washed/rinsed.  Use gentle soap & water.  This will help the incisions and/or wounds get clean & minimize infection.     May walk up steps    Complete by:  As directed      Sexual Activity Restrictions    Complete by:  As directed   Sexual activity as tolerated.  Do not push through pain.  Pain will protect you from injury.     Walk with assistance    Complete by:  As directed   Walk over an hour a day.  May use a walker/cane/companion to help with balance and stamina.            Medication List    TAKE these medications        acetaminophen 325 MG tablet  Commonly known as:  TYLENOL  Take 650 mg by mouth every 6 (six) hours as needed.     AFRIN NASAL SPRAY NA  Place into the nose. As needed     benzonatate 200 MG capsule  Commonly known as:  TESSALON  Take 1 capsule (200 mg total) by mouth 2 (two) times daily as needed for cough.     calcium carbonate 600 MG Tabs tablet  Commonly known as:  OS-CAL  Take 600 mg by mouth 2 (two) times daily with a meal. Reported on 06/16/2015     cefdinir 300 MG capsule  Commonly known as:  OMNICEF  Take 1 capsule (300 mg total) by mouth 2 (two) times daily.     Cholecalciferol 1000 units tablet  Take 2,000 Units by mouth 2 (two) times daily. Reported on 07/03/2015     diazepam 5 MG tablet  Commonly known as:  VALIUM  Take 2.5 mg by  mouth every 6 (six) hours as needed for anxiety.     fluticasone 50 MCG/ACT nasal spray  Commonly known as:  FLONASE  Place into both nostrils daily.  fluticasone furoate-vilanterol 100-25 MCG/INH Aepb  Commonly known as:  BREO ELLIPTA  Inhale 1 puff into the lungs daily.     multivitamin tablet  Take 1 tablet by mouth daily.     ondansetron 4 MG disintegrating tablet  Commonly known as:  ZOFRAN ODT  Take 1 tablet (4 mg total) by mouth every 6 (six) hours as needed for nausea or vomiting.     OVER THE COUNTER MEDICATION  guiafenesin - sudafed er 600mg /60 mg as needed     pantoprazole 40 MG tablet  Commonly known as:  PROTONIX  Take 1 tablet (40 mg total) by mouth daily.     RHINOCORT ALLERGY 32 MCG/ACT nasal spray  Generic drug:  budesonide  Place 1 spray into both nostrils daily.           Follow-up Information    Follow up with Asa Baudoin J, MD In 2 weeks.   Specialty:  General Surgery   Why:  To follow up after your operation, To follow up after your hospital stay   Contact information:   [address] [place] [address] [phone]       Signed: [name] 08/11/2015, 1:19 PM

## 2015-08-15 ENCOUNTER — Other Ambulatory Visit: Payer: Self-pay | Admitting: Internal Medicine

## 2015-08-15 MED ORDER — BENZONATATE 200 MG PO CAPS: 200.0000 mg | ORAL_CAPSULE | Freq: Two times a day (BID) | ORAL | Status: DC | PRN

## 2015-08-22 ENCOUNTER — Other Ambulatory Visit: Payer: Self-pay | Admitting: General Surgery

## 2015-08-22 DIAGNOSIS — M79604 Pain in right leg: Secondary | ICD-10-CM

## 2015-08-23 ENCOUNTER — Ambulatory Visit
Admission: RE | Admit: 2015-08-23 | Discharge: 2015-08-23 | Disposition: A | Discharge status: Home / Self Care | Payer: Medicare Other | Source: Ambulatory Visit | Attending: General Surgery | Admitting: General Surgery

## 2015-08-23 ENCOUNTER — Inpatient Hospital Stay (HOSPITAL_COMMUNITY): Payer: Medicare Other

## 2015-08-23 ENCOUNTER — Encounter: Payer: Self-pay | Admitting: Internal Medicine

## 2015-08-23 ENCOUNTER — Ambulatory Visit (INDEPENDENT_AMBULATORY_CARE_PROVIDER_SITE_OTHER): Payer: Medicare Other | Admitting: Internal Medicine

## 2015-08-23 ENCOUNTER — Encounter (HOSPITAL_COMMUNITY): Payer: Self-pay | Admitting: Emergency Medicine

## 2015-08-23 ENCOUNTER — Emergency Department (HOSPITAL_COMMUNITY): Payer: Medicare Other

## 2015-08-23 ENCOUNTER — Other Ambulatory Visit: Payer: Self-pay | Admitting: General Surgery

## 2015-08-23 ENCOUNTER — Inpatient Hospital Stay (HOSPITAL_COMMUNITY)
Admission: EM | Admit: 2015-08-23 | Discharge: 2015-08-26 | DRG: 175 | Disposition: A | Discharge status: Home / Self Care | Payer: Medicare Other | Attending: Pulmonary Disease | Admitting: Pulmonary Disease

## 2015-08-23 ENCOUNTER — Telehealth: Payer: Self-pay

## 2015-08-23 VITALS — BP 140/80 | HR 125

## 2015-08-23 DIAGNOSIS — I2781 Cor pulmonale (chronic): Secondary | ICD-10-CM | POA: Diagnosis not present

## 2015-08-23 DIAGNOSIS — I82401 Acute embolism and thrombosis of unspecified deep veins of right lower extremity: Secondary | ICD-10-CM

## 2015-08-23 DIAGNOSIS — R071 Chest pain on breathing: Secondary | ICD-10-CM

## 2015-08-23 DIAGNOSIS — Z86711 Personal history of pulmonary embolism: Secondary | ICD-10-CM | POA: Diagnosis present

## 2015-08-23 DIAGNOSIS — R05 Cough: Secondary | ICD-10-CM | POA: Diagnosis present

## 2015-08-23 DIAGNOSIS — M19049 Primary osteoarthritis, unspecified hand: Secondary | ICD-10-CM | POA: Diagnosis present

## 2015-08-23 DIAGNOSIS — I2602 Saddle embolus of pulmonary artery with acute cor pulmonale: Secondary | ICD-10-CM | POA: Diagnosis not present

## 2015-08-23 DIAGNOSIS — I82409 Acute embolism and thrombosis of unspecified deep veins of unspecified lower extremity: Secondary | ICD-10-CM | POA: Diagnosis present

## 2015-08-23 DIAGNOSIS — Z85828 Personal history of other malignant neoplasm of skin: Secondary | ICD-10-CM

## 2015-08-23 DIAGNOSIS — J9601 Acute respiratory failure with hypoxia: Secondary | ICD-10-CM | POA: Diagnosis not present

## 2015-08-23 DIAGNOSIS — Z888 Allergy status to other drugs, medicaments and biological substances status: Secondary | ICD-10-CM

## 2015-08-23 DIAGNOSIS — Z882 Allergy status to sulfonamides status: Secondary | ICD-10-CM

## 2015-08-23 DIAGNOSIS — K219 Gastro-esophageal reflux disease without esophagitis: Secondary | ICD-10-CM | POA: Diagnosis present

## 2015-08-23 DIAGNOSIS — R0781 Pleurodynia: Secondary | ICD-10-CM | POA: Diagnosis not present

## 2015-08-23 DIAGNOSIS — E559 Vitamin D deficiency, unspecified: Secondary | ICD-10-CM | POA: Diagnosis present

## 2015-08-23 DIAGNOSIS — Z885 Allergy status to narcotic agent status: Secondary | ICD-10-CM

## 2015-08-23 DIAGNOSIS — R000 Tachycardia, unspecified: Secondary | ICD-10-CM | POA: Diagnosis present

## 2015-08-23 DIAGNOSIS — I82431 Acute embolism and thrombosis of right popliteal vein: Secondary | ICD-10-CM | POA: Diagnosis present

## 2015-08-23 DIAGNOSIS — Z79899 Other long term (current) drug therapy: Secondary | ICD-10-CM | POA: Diagnosis not present

## 2015-08-23 DIAGNOSIS — N289 Disorder of kidney and ureter, unspecified: Secondary | ICD-10-CM | POA: Diagnosis present

## 2015-08-23 DIAGNOSIS — Z86718 Personal history of other venous thrombosis and embolism: Secondary | ICD-10-CM | POA: Insufficient documentation

## 2015-08-23 DIAGNOSIS — M810 Age-related osteoporosis without current pathological fracture: Secondary | ICD-10-CM | POA: Diagnosis present

## 2015-08-23 DIAGNOSIS — E876 Hypokalemia: Secondary | ICD-10-CM

## 2015-08-23 DIAGNOSIS — I2699 Other pulmonary embolism without acute cor pulmonale: Secondary | ICD-10-CM | POA: Diagnosis not present

## 2015-08-23 DIAGNOSIS — D72829 Elevated white blood cell count, unspecified: Secondary | ICD-10-CM | POA: Diagnosis not present

## 2015-08-23 DIAGNOSIS — F419 Anxiety disorder, unspecified: Secondary | ICD-10-CM | POA: Diagnosis present

## 2015-08-23 DIAGNOSIS — K210 Gastro-esophageal reflux disease with esophagitis: Secondary | ICD-10-CM | POA: Diagnosis present

## 2015-08-23 DIAGNOSIS — R0602 Shortness of breath: Secondary | ICD-10-CM | POA: Diagnosis not present

## 2015-08-23 DIAGNOSIS — I2692 Saddle embolus of pulmonary artery without acute cor pulmonale: Secondary | ICD-10-CM | POA: Diagnosis not present

## 2015-08-23 DIAGNOSIS — J309 Allergic rhinitis, unspecified: Secondary | ICD-10-CM | POA: Diagnosis present

## 2015-08-23 DIAGNOSIS — M79604 Pain in right leg: Secondary | ICD-10-CM

## 2015-08-23 DIAGNOSIS — R079 Chest pain, unspecified: Secondary | ICD-10-CM | POA: Insufficient documentation

## 2015-08-23 LAB — BASIC METABOLIC PANEL
Anion gap: 12 (ref 5–15)
BUN: 27 mg/dL — ABNORMAL HIGH (ref 6–20)
CALCIUM: 9.4 mg/dL (ref 8.9–10.3)
CO2: 20 mmol/L — AB (ref 22–32)
Chloride: 106 mmol/L (ref 101–111)
Creatinine, Ser: 0.96 mg/dL (ref 0.44–1.00)
GFR calc Af Amer: >60 mL/min (ref >60)
GFR calc non Af Amer: 57 mL/min — ABNORMAL LOW (ref >60)
GLUCOSE: 144 mg/dL — AB (ref 65–99)
Potassium: 3.4 mmol/L — ABNORMAL LOW (ref 3.5–5.1)
Sodium: 138 mmol/L (ref 135–145)

## 2015-08-23 LAB — ECHOCARDIOGRAM COMPLETE
CHL CUP TV REG PEAK VELOCITY: 335 cm/s
EERAT: 6.98
EWDT: 116 ms
FS: 30 % (ref 28–44)
Height: 62.25 in
IV/PV OW: 0.97
LA diam index: 1.75 cm/m2
LA vol A4C: 38.6 ml
LA vol index: 25.2 mL/m2
LA vol: 38.8 mL
LASIZE: 27 mm
LEFT ATRIUM END SYS DIAM: 27 mm
LV E/e'average: 6.98
LV PW d: 11.3 mm — AB (ref 0.6–1.1)
LV SIMPSON'S DISK: 62
LV dias vol: 27 mL — AB (ref 46–106)
LV sys vol index: 7 mL/m2
LV sys vol: 10 mL — AB (ref 14–42)
LVDIAVOLIN: 17 mL/m2
LVEEMED: 6.98
LVOT area: 3.14 cm2
LVOT diameter: 20 mm
MV Dec: 116
MVPKAVEL: 94.6 m/s
MVPKEVEL: 40.3 m/s
RV TAPSE: 11.8 mm
Stroke v: 17 ml
TDI e' medial: 5.77
TR max vel: 335 cm/s
WEIGHTICAEL: 1904 [oz_av]

## 2015-08-23 LAB — CBC WITH DIFFERENTIAL/PLATELET
BASOS ABS: 0 10*3/uL (ref 0.0–0.1)
Basophils Relative: 0 %
EOS PCT: 0 %
Eosinophils Absolute: 0.1 10*3/uL (ref 0.0–0.7)
HEMATOCRIT: 39.4 % (ref 36.0–46.0)
Hemoglobin: 12.9 g/dL (ref 12.0–15.0)
LYMPHS ABS: 2 10*3/uL (ref 0.7–4.0)
LYMPHS PCT: 10 %
MCH: 28.6 pg (ref 26.0–34.0)
MCHC: 32.7 g/dL (ref 30.0–36.0)
MCV: 87.4 fL (ref 78.0–100.0)
MONO ABS: 1.4 10*3/uL — AB (ref 0.1–1.0)
MONOS PCT: 7 %
Neutro Abs: 16.8 10*3/uL — ABNORMAL HIGH (ref 1.7–7.7)
Neutrophils Relative %: 83 %
PLATELETS: 392 10*3/uL (ref 150–400)
RBC: 4.51 MIL/uL (ref 3.87–5.11)
RDW: 13.7 % (ref 11.5–15.5)
WBC: 20.4 10*3/uL — ABNORMAL HIGH (ref 4.0–10.5)

## 2015-08-23 LAB — I-STAT CHEM 8, ED
BUN: 27 mg/dL — ABNORMAL HIGH (ref 6–20)
CALCIUM ION: 1.19 mmol/L (ref 1.13–1.30)
Chloride: 108 mmol/L (ref 101–111)
Creatinine, Ser: 0.9 mg/dL (ref 0.44–1.00)
GLUCOSE: 141 mg/dL — AB (ref 65–99)
HCT: 42 % (ref 36.0–46.0)
HEMOGLOBIN: 14.3 g/dL (ref 12.0–15.0)
POTASSIUM: 3.5 mmol/L (ref 3.5–5.1)
SODIUM: 140 mmol/L (ref 135–145)
TCO2: 21 mmol/L (ref 0–100)

## 2015-08-23 LAB — PROTIME-INR
INR: 1.27 (ref 0.00–1.49)
PROTHROMBIN TIME: 15.5 s — AB (ref 11.6–15.2)

## 2015-08-23 LAB — MAGNESIUM: MAGNESIUM: 1.6 mg/dL — AB (ref 1.7–2.4)

## 2015-08-23 LAB — TROPONIN I: TROPONIN I: 1.62 ng/mL — AB (ref <0.031)

## 2015-08-23 LAB — APTT: aPTT: 27 seconds (ref 24–37)

## 2015-08-23 LAB — LACTIC ACID, PLASMA: LACTIC ACID, VENOUS: 1.3 mmol/L (ref 0.5–2.0)

## 2015-08-23 MED ORDER — ADULT MULTIVITAMIN W/MINERALS CH: 1.0000 | ORAL_TABLET | Freq: Every day | ORAL | Status: DC

## 2015-08-23 MED ORDER — SODIUM CHLORIDE 0.9 % IV SOLN
INTRAVENOUS | Status: DC
  Administered 2015-08-23: 35 mL/h via INTRAVENOUS

## 2015-08-23 MED ORDER — HEPARIN BOLUS VIA INFUSION
1600.0000 [IU] | Freq: Once | INTRAVENOUS | Status: AC
  Administered 2015-08-23: 1600 [IU] via INTRAVENOUS
  Filled 2015-08-23: qty 1600

## 2015-08-23 MED ORDER — SODIUM CHLORIDE 0.9 % IV SOLN
12.0000 mg | Freq: Once | INTRAVENOUS | Status: AC
  Administered 2015-08-23: 1 mg via INTRAVENOUS
  Filled 2015-08-23: qty 12

## 2015-08-23 MED ORDER — SENNOSIDES-DOCUSATE SODIUM 8.6-50 MG PO TABS
1.0000 | ORAL_TABLET | Freq: Every day | ORAL | Status: DC
  Administered 2015-08-24 – 2015-08-26 (×3): 1 via ORAL
  Filled 2015-08-23 (×3): qty 1

## 2015-08-23 MED ORDER — SODIUM CHLORIDE 0.9% FLUSH
3.0000 mL | Freq: Two times a day (BID) | INTRAVENOUS | Status: DC
  Administered 2015-08-24 – 2015-08-25 (×5): 3 mL via INTRAVENOUS

## 2015-08-23 MED ORDER — SODIUM CHLORIDE 0.9 % IV SOLN
INTRAVENOUS | Status: DC
  Administered 2015-08-24 (×2): via INTRAVENOUS

## 2015-08-23 MED ORDER — ONDANSETRON 4 MG PO TBDP: 4.0000 mg | ORAL_TABLET | Freq: Four times a day (QID) | ORAL | Status: DC | PRN

## 2015-08-23 MED ORDER — ONE-DAILY MULTI VITAMINS PO TABS: 1.0000 | ORAL_TABLET | Freq: Every day | ORAL | Status: DC

## 2015-08-23 MED ORDER — ONDANSETRON HCL 4 MG/2ML IJ SOLN: 4.0000 mg | Freq: Four times a day (QID) | INTRAMUSCULAR | Status: DC | PRN

## 2015-08-23 MED ORDER — PANTOPRAZOLE SODIUM 40 MG PO TBEC
40.0000 mg | DELAYED_RELEASE_TABLET | Freq: Every day | ORAL | Status: DC
  Administered 2015-08-24 – 2015-08-26 (×3): 40 mg via ORAL
  Filled 2015-08-23 (×3): qty 1

## 2015-08-23 MED ORDER — ONDANSETRON HCL 4 MG PO TABS
4.0000 mg | ORAL_TABLET | Freq: Four times a day (QID) | ORAL | Status: DC | PRN
Start: 2015-08-23 — End: 2015-08-26
  Administered 2015-08-24: 4 mg via ORAL
  Filled 2015-08-23: qty 1

## 2015-08-23 MED ORDER — SODIUM CHLORIDE 0.9 % IV SOLN
INTRAVENOUS | Status: DC
  Administered 2015-08-24: 10:00:00 via INTRAVENOUS

## 2015-08-23 MED ORDER — FLUTICASONE PROPIONATE 50 MCG/ACT NA SUSP
1.0000 | Freq: Every day | NASAL | Status: DC
  Filled 2015-08-23: qty 16

## 2015-08-23 MED ORDER — FENTANYL CITRATE (PF) 100 MCG/2ML IJ SOLN
INTRAMUSCULAR | Status: DC | PRN
  Administered 2015-08-23: 25 ug via INTRAVENOUS
  Administered 2015-08-23: 50 ug via INTRAVENOUS

## 2015-08-23 MED ORDER — CALCIUM CARBONATE 600 MG PO TABS: 600.0000 mg | ORAL_TABLET | Freq: Two times a day (BID) | ORAL | Status: DC

## 2015-08-23 MED ORDER — POTASSIUM CHLORIDE 10 MEQ/100ML IV SOLN
10.0000 meq | INTRAVENOUS | Status: AC
Start: 2015-08-23 — End: 2015-08-24
  Administered 2015-08-24 (×3): 10 meq via INTRAVENOUS
  Filled 2015-08-23 (×3): qty 100

## 2015-08-23 MED ORDER — CHOLECALCIFEROL 25 MCG (1000 UT) PO TABS: 2000.0000 [IU] | ORAL_TABLET | Freq: Two times a day (BID) | ORAL | Status: DC

## 2015-08-23 MED ORDER — CALCIUM CARBONATE 1250 (500 CA) MG PO TABS
1.0000 | ORAL_TABLET | Freq: Two times a day (BID) | ORAL | Status: DC
  Filled 2015-08-23: qty 1

## 2015-08-23 MED ORDER — BENZONATATE 100 MG PO CAPS
100.0000 mg | ORAL_CAPSULE | Freq: Three times a day (TID) | ORAL | Status: DC | PRN
  Filled 2015-08-23: qty 1

## 2015-08-23 MED ORDER — HEPARIN (PORCINE) IN NACL 100-0.45 UNIT/ML-% IJ SOLN
800.0000 [IU]/h | INTRAMUSCULAR | Status: DC
  Administered 2015-08-23: 900 [IU]/h via INTRAVENOUS
  Administered 2015-08-24: 800 [IU]/h via INTRAVENOUS
  Filled 2015-08-23 (×3): qty 250

## 2015-08-23 MED ORDER — SODIUM CHLORIDE 0.9 % IV BOLUS (SEPSIS)
1000.0000 mL | Freq: Once | INTRAVENOUS | Status: AC
  Administered 2015-08-23: 1000 mL via INTRAVENOUS

## 2015-08-23 MED ORDER — SENNA-DOCUSATE SODIUM 8.6-50 MG PO TABS: 1.0000 | ORAL_TABLET | Freq: Every day | ORAL | Status: DC

## 2015-08-23 MED ORDER — IOPAMIDOL (ISOVUE-370) INJECTION 76%
100.0000 mL | Freq: Once | INTRAVENOUS | Status: AC | PRN
  Administered 2015-08-23: 100 mL via INTRAVENOUS

## 2015-08-23 MED ORDER — SODIUM CHLORIDE 0.9 % IV SOLN: 250.0000 mL | INTRAVENOUS | Status: DC | PRN

## 2015-08-23 MED ORDER — SENNOSIDES-DOCUSATE SODIUM 8.6-50 MG PO TABS: 1.0000 | ORAL_TABLET | Freq: Every day | ORAL | Status: DC

## 2015-08-23 MED ORDER — MIDAZOLAM HCL 2 MG/2ML IJ SOLN
INTRAMUSCULAR | Status: DC | PRN
  Administered 2015-08-23: 1 mg via INTRAVENOUS
  Administered 2015-08-23: 0.5 mg via INTRAVENOUS

## 2015-08-23 MED ORDER — SODIUM CHLORIDE 0.9% FLUSH: 3.0000 mL | INTRAVENOUS | Status: DC | PRN

## 2015-08-23 MED ORDER — VITAMIN D3 25 MCG (1000 UNIT) PO TABS: 2000.0000 [IU] | ORAL_TABLET | Freq: Two times a day (BID) | ORAL | Status: DC

## 2015-08-23 NOTE — ED Notes (Signed)
ECHO technician is en route to [facility], ETA 25 minutes. He would like to complete the patient's ECHO before she is transported.

## 2015-08-23 NOTE — Progress Notes (Signed)
ANTICOAGULATION CONSULT NOTE - Initial Consult  Pharmacy Consult for heparin Indication: pulmonary embolus  Allergies  Allergen Reactions  . Codeine Nausea And Vomiting  . Dilaudid [Hydromorphone Hcl] Other (See Comments)    "too strong - I can't wake up"  . Sulfonamide Derivatives Other (See Comments)    Childhood allergy; reaction unknown  . Tape Other (See Comments)    REDDNESS, BLISTER/  Use paper tape  . Norco [Hydrocodone-Acetaminophen] Rash    Patient Measurements:   Heparin Dosing Weight: 56.7kg  Vital Signs: Temp: 97.7 F (36.5 C) (06/14 1534) Temp Source: Oral (06/14 1534) BP: 164/78 mmHg (06/14 1534) Pulse Rate: 131 (06/14 1534)  Labs:  Recent Labs  08/23/15 1549  HGB 14.3  HCT 42.0  CREATININE 0.90    CrCl cannot be calculated (Unknown ideal weight.).   Medical History: Past Medical History  Diagnosis Date  . Hematuria     in past  . Hair loss   . Unspecified vitamin D deficiency   . Migraines     in past  . Osteoporosis   . History of creation of ostomy     past partial colectomy in 05/03/14  . Complication of anesthesia   . PONV (postoperative nausea and vomiting)   . GERD (gastroesophageal reflux disease)   . Diverticulitis   . Kidney cysts   . Persistent cough   . History of skin cancer   . Deviated nasal septum   . Anxiety   . Cancer ([place])     hx skin cancer  . Ventral hernia   . Congestion of both ears   . Pneumonia     july 2016, pneumonia-06/2015   . Osteoarthritis     fingers   . Osteoporosis     Assessment: No H&P, 75yo female here with complaints of SOB, reports that she was at Dr. Dennison Nancy office today and diagnosed with DVT to right leg.  Recent surgery 3 weeks ago. Pharmacy consulted to dose heparin for PE.   CBC ordered APTT ordered PT/INR ordered Plts WNL H/H WNL  Goal of Therapy:  Heparin level 0.3-0.7 units/ml Monitor platelets by anticoagulation protocol    Plan:   Using [place] Nomogram    Heparin bolus 1600 Units x 1  Then heparin drip 900 units/hr  Check HL in 8 hours  Daily CBC and heparin level    Dolly Rias RPh 08/23/2015, 4:03 PM Pager [phone]

## 2015-08-23 NOTE — ED Notes (Signed)
Patient tearful, questions "is this something I can come out of". Patient states she has had 3 surgeries in the recent past, most recently multiple hernia repairs 3 months ago. Patient began having a "muscle cramp" on her right leg with swelling. Patient was diagnosed with a DVT to the right leg. Patient was also experiencing coughing and SOB as well as fatigue and was sent here to rule out PE. Patient is SOB when speaking, currently wearing 2.5L [place] O2. Patient husband at bedside. Patient and spouse updated on plan of care.

## 2015-08-23 NOTE — Consult Note (Signed)
Chief Complaint: Submassive PE  Referring Physician(s): McQuaid (DDM)  Patient Status: Inpatient  History of Present Illness: Kristen Castillo is a 75 y.o. female with history of recent ventral hernia repair who presented to ED with SOB, after out-pt LE venous doppler ultrasound demonstrated femoral and popliteal DVT.  Chest CTA obtained in ED demonstrated bilateral saddle PE and right sided heart strain.  Pt was evaluated by CCM and request made for bilateral USAT PE lysis.  As such, pt was transferred from [facility] to [facility] for this procedure.  Pt is accompanied by her husband though serves as her own historian.    Past Medical History  Diagnosis Date  . Hematuria     in past  . Hair loss   . Unspecified vitamin D deficiency   . Migraines     in past  . Osteoporosis   . History of creation of ostomy     past partial colectomy in 05/03/14  . Complication of anesthesia   . PONV (postoperative nausea and vomiting)   . GERD (gastroesophageal reflux disease)   . Diverticulitis   . Kidney cysts   . Persistent cough   . History of skin cancer   . Deviated nasal septum   . Anxiety   . Cancer ([place])     hx skin cancer  . Ventral hernia   . Congestion of both ears   . Pneumonia     july 2016, pneumonia-06/2015   . Osteoarthritis     fingers   . Osteoporosis     Past Surgical History  Procedure Laterality Date  . Abdominal hysterectomy  1972  . Oophorectomy  1976  . Tonsillectomy    . Lipoma resection      left scapula area  . Reduction mammaplasty  1994  . Rhinoplasty  1985  . Facial cosmetic surgery  2009    [name] MD in [place]  . Laparoscopic partial colectomy N/A 05/03/2014    Procedure: DIAGNOSTIC LAPAROSCOPY WITH DRAINAGE OF INTRA ABDOMINAL ABSCESS PARTIAL COLECTOMY ;  Surgeon: [name], MD;  Location: [facility];  Service: General;  Laterality: N/A;  supine position  . Laparotomy N/A 05/03/2014    Procedure: EXPLORATORY LAPAROTOMY WITH HARTMAN PROCEDURE;   Surgeon: [name], MD;  Location: [facility];  Service: General;  Laterality: N/A;  . Ileo loop colostomy closure N/A 11/08/2014    Procedure: LAPAROSCOPIC COLOSTOMY CLOSURE WITH PARTIAL COLECTOMY;  Surgeon: [name], MD;  Location: [facility];  Service: General;  Laterality: N/A;  . Laparoscopic lysis of adhesions N/A 11/08/2014    Procedure: LAPAROSCOPIC LYSIS OF ADHESIONS FOR 90 MINUTES;  Surgeon: [name], MD;  Location: [facility];  Service: General;  Laterality: N/A;  . Ventral hernia repair N/A 08/01/2015    Procedure: LAPAROSCOPIC VENTRAL INCISIONAL  HERNIA;  Surgeon: [name], MD;  Location: [facility];  Service: General;  Laterality: N/A;  . Insertion of mesh N/A 08/01/2015    Procedure: INSERTION OF MESH;  Surgeon: [name], MD;  Location: [facility];  Service: General;  Laterality: N/A;  . Laparoscopic lysis of adhesions N/A 08/01/2015    Procedure: LAPAROSCOPIC LYSIS OF ADHESIONS;  Surgeon: [name], MD;  Location: [facility];  Service: General;  Laterality: N/A;    Allergies: Codeine; Dilaudid; Sulfonamide derivatives; Tape; and Norco  Medications: Prior to Admission medications   Medication Sig Start Date End Date Taking? Authorizing Provider  benzonatate (TESSALON) 200 MG capsule Take 1 capsule (200 mg total) by mouth  2 (two) times daily as needed for cough. 08/15/15  Yes Evie Lacks Plotnikov, MD  budesonide (RHINOCORT ALLERGY) 32 MCG/ACT nasal spray Place 1 spray into both nostrils daily.   Yes Historical Provider, MD  fluticasone (FLONASE) 50 MCG/ACT nasal spray Place into both nostrils daily.   Yes Historical Provider, MD  ibuprofen (ADVIL,MOTRIN) 200 MG tablet Take 200 mg by mouth 2 (two) times daily.   Yes Historical Provider, MD  pantoprazole (PROTONIX) 40 MG tablet Take 1 tablet (40 mg total) by mouth daily. 10/18/14  Yes Amy S Esterwood, PA-C  sennosides-docusate sodium (SENOKOT-S) 8.6-50 MG tablet Take 1 tablet by mouth daily.   Yes Historical Provider, MD    benzonatate (TESSALON) 200 MG capsule TAKE 1 CAPSULE TWICE DAILY AS NEEDED FOR COUGH. Patient not taking: Reported on 08/23/2015 08/15/15   Evie Lacks Plotnikov, MD  calcium carbonate (OS-CAL) 600 MG TABS tablet Take 600 mg by mouth 2 (two) times daily with a meal. Reported on 06/16/2015    Historical Provider, MD  cefdinir (OMNICEF) 300 MG capsule Take 1 capsule (300 mg total) by mouth 2 (two) times daily. Patient not taking: Reported on 08/23/2015 06/30/15   Cassandria Anger, MD  Cholecalciferol 1000 UNITS tablet Take 2,000 Units by mouth 2 (two) times daily. Reported on 07/03/2015    Historical Provider, MD  fluticasone furoate-vilanterol (BREO ELLIPTA) 100-25 MCG/INH AEPB Inhale 1 puff into the lungs daily. Patient not taking: Reported on 08/23/2015 06/30/15   Cassandria Anger, MD  Multiple Vitamin (MULTIVITAMIN) tablet Take 1 tablet by mouth daily.    Historical Provider, MD  ondansetron (ZOFRAN ODT) 4 MG disintegrating tablet Take 1 tablet (4 mg total) by mouth every 6 (six) hours as needed for nausea or vomiting. 11/15/14   [name], MD  OVER THE COUNTER MEDICATION guiafenesin - sudafed er 600mg /60 mg as needed    Historical Provider, MD     Family History  Problem Relation Age of Onset  . Arthritis Mother   . Colon cancer Mother 30  . Heart disease Father   . Dementia Father   . Diabetes Sister   . Cancer Maternal Grandfather     Lung cancer  . Stomach cancer Neg Hx     Social History   Social History  . Marital Status: Married    Spouse Name: N/A  . Number of Children: N/A  . Years of Education: N/A   Social History Main Topics  . Smoking status: Never Smoker   . Smokeless tobacco: Never Used  . Alcohol Use: No  . Drug Use: No  . Sexual Activity: Not Asked   Other Topics Concern  . 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  All other systems are negative.  Review of Systems  Vital Signs: BP 140/103 mmHg  Pulse 115  Temp(Src) 97.7 F (36.5 C) (Oral)  Resp 25  Ht 5' 2.25" (1.581 m)  Wt 119 lb (53.978 kg)  BMI 21.59 kg/m2  SpO2 96%  Physical Exam  Mallampati Score:     Imaging: Dg Chest 2 View  07/26/2015  CLINICAL DATA:  Recent pneumonia.  Preoperative hernia repair EXAM: CHEST  2 VIEW COMPARISON:  Jul 17, 2015 and June 16, 2015 FINDINGS: Patchy opacity remains in the lateral right base. Lungs elsewhere clear. Heart size and pulmonary vascular normal. No adenopathy. No bone lesions. IMPRESSION: Patchy opacity concerning for a focus of pneumonia remains in the lateral right base, stable. No new opacity. Stable cardiac silhouette. Electronically Signed   By: Lowella Grip III M.D.   On: 07/26/2015 14:33   Ct Angio Chest Pe W/cm &/or Wo Cm  08/23/2015  CLINICAL DATA:  Shortness of breath. Right lower extremity deep venous thrombosis. Recent surgery. EXAM: CT ANGIOGRAPHY CHEST WITH CONTRAST TECHNIQUE: Multidetector CT imaging of the chest was performed using the standard protocol during bolus administration of intravenous contrast. Multiplanar CT image reconstructions and MIPs were obtained to evaluate the vascular anatomy. CONTRAST:  100 cc Isovue 370 IV. COMPARISON:  07/26/2015 chest radiograph.  No prior chest CT. FINDINGS: Mediastinum/Nodes: The study is high quality for the evaluation of pulmonary embolism. There is an acute saddle pulmonary embolus. There is a large burden of acute pulmonary embolism involving lobar, segmental and subsegmental branches of all lung lobes in both lungs. Minimally atherosclerotic nonaneurysmal thoracic aorta. Mildly dilated main pulmonary artery (3.1 cm diameter). Mild  cardiomegaly with dilated right ventricle and right atrium (RV-LV ratio 1.7). No pericardial fluid/thickening. Normal visualized thyroid. Normal esophagus. No pathologically enlarged axillary, mediastinal or hilar lymph nodes. Lungs/Pleura: No pneumothorax. No pleural effusion. Wedge-shaped small subpleural focus of ground-glass opacity in the basilar medial right upper lobe (series 7/ image 44). Posterior right upper lobe 3 mm pulmonary nodule (series 7/ image 27). Irregular 1.6 x 0.7 cm focus of consolidation in the posterior left upper lobe (series 7/ image 35). Additional patchy foci of ground-glass opacity in peribronchovascular left upper lobe. Subsegmental atelectasis in both lower lobes. Upper abdomen: Unremarkable. Musculoskeletal: No aggressive appearing focal osseous lesions. Mild degenerative changes in the thoracic spine. Review of the MIP images confirms the above findings. IMPRESSION: 1. Large burden acute pulmonary embolism with saddle pulmonary embolus. Pulmonary emboli involves lobar, segmental and subsegmental branches of all lung lobes in both lungs. 2. Dilated right atrium and right ventricle. Positive for acute PE with CT evidence of right heart strain (RV/LV Ratio = 1.7) consistent with at least submassive (intermediate risk) PE. The presence of right heart strain has been associated with an increased risk of morbidity and mortality. Please activate Code PE by paging [phone]. 3. Wedge-shaped small subpleural focus of ground-glass opacity in the basilar medial right upper lobe, probably a small pulmonary infarct. Additional mild patchy areas of ground-glass opacity in the left upper lobe probably represent areas of mild alveolar hemorrhage. 4. Additional irregular 1.6 cm focus of consolidation in the posterior left upper lobe, which is indeterminate. A follow-up chest CT is recommended in 3 months with attention to this focus. Critical Value/emergent results were called by telephone at the  time of interpretation on 08/23/2015 at 5:25 pm to Dr. Leo Grosser , who verbally acknowledged these results. Electronically Signed   By: Ilona Sorrel M.D.   On: 08/23/2015 17:37   US Venous Img Lower Unilateral Right  08/23/2015  CLINICAL DATA:  Right lower extremity pain and swelling. Evaluate for DVT. EXAM: Right LOWER EXTREMITY VENOUS DOPPLER ULTRASOUND TECHNIQUE: Gray-scale sonography with graded compression, as well as color Doppler and duplex ultrasound were performed to evaluate the lower extremity deep venous systems from the level of the common femoral vein and including  the common femoral, femoral, profunda femoral, popliteal and calf veins including the posterior tibial, peroneal and gastrocnemius veins when visible. The superficial great saphenous vein was also interrogated. Spectral Doppler was utilized to evaluate flow at rest and with distal augmentation maneuvers in the common femoral, femoral and popliteal veins. COMPARISON:  None. FINDINGS: Expanded veins with internal echoes affecting the distal right femoral vein, popliteal vein, and posterior tibial/peroneal calf veins consistent with occlusive thrombus. No internal color Doppler flow. Above the clot deep veins are patent with symmetric respiratory phasicity. These results will be called to the ordering clinician or representative by the Radiology Department at the imaging location prior to patient disposition. IMPRESSION: Positive for occlusive deep venous thrombosis from the distal right femoral vein into the calf. Electronically Signed   By: Monte Fantasia M.D.   On: 08/23/2015 13:53    Labs:  CBC:  Recent Labs  06/29/15 1205 07/26/15 1140 08/02/15 0451 08/23/15 1545 08/23/15 1549  WBC 16.1* 9.0 8.5 20.4*  --   HGB 13.6 13.7 11.2* 12.9 14.3  HCT 40.5 43.5 34.9* 39.4 42.0  PLT 431* 327 255 392  --     COAGS:  Recent Labs  11/03/14 1030 06/29/15 1205 08/23/15 1545  INR 1.03 1.13 1.27  APTT  --   --  27     BMP:  Recent Labs  06/29/15 1205 07/26/15 1140 08/02/15 0451 08/23/15 1545 08/23/15 1549  NA 139 138 138 138 140  K 4.6 3.9 4.7 3.4* 3.5  CL 106 107 111 106 108  CO2 24 26 24  20*  --   GLUCOSE 114* 116* 137* 144* 141*  BUN 34* 35* 13 27* 27*  CALCIUM 9.8 9.5 7.9* 9.4  --   CREATININE 0.90 0.95 0.98 0.96 0.90  GFRNONAA >60 58* 55* 57*  --   GFRAA >60 >60 >60 >60  --     LIVER FUNCTION TESTS:  Recent Labs  11/03/14 1030 01/19/15 1527 06/29/15 1205  BILITOT 0.4 0.4 0.4  AST 20 14 15   ALT 13* 8 11*  ALKPHOS 86 73 80  PROT 7.6 7.3 7.9  ALBUMIN 4.0 3.9  3.9 3.7    TUMOR MARKERS: No results for input(s): AFPTM, CEA, CA199, CHROMGRNA in the last 8760 hours.  Assessment and Plan:  Images reviewed and given associated clinical symptoms (as assessed by the CCM service), the pt is a candidate for bilateral USAT PE lysis.  Risks and Benefits of catheter directed bilateral pulmonary artery lysis were discussed with the patient and the patient's husband including, but not limited to bleeding, possible life threatening bleeding and need for blood product transfusion, vascular injury, stroke, contrast induced renal failure, limb loss and infection.  All of the patient's questions were answered, patient is agreeable to proceed.  Consent signed and in chart.  Thank you for this interesting consult.  I greatly enjoyed meeting Kristen Castillo and look forward to participating in their care.  A copy of this report was sent to the requesting provider on this date.  Electronically Signed: Sandi Mariscal 08/23/2015, 10:12 PM

## 2015-08-23 NOTE — Progress Notes (Signed)
Subjective:  Patient ID: Kristen Castillo, female    DOB: 1940/12/22  Age: 75 y.o. MRN: [id number]  CC: No chief complaint on file.   HPI Kristen Castillo presents for RLE swelling x 2 weeks and cramping. [name] w/RLE DVT. C/o R CP and cough x 2 weeks. C/o SOB x 1d  Past Medical History  Diagnosis Date  . Hematuria     in past  . Hair loss   . Unspecified vitamin D deficiency   . Migraines     in past  . Osteoporosis   . History of creation of ostomy     past partial colectomy in 05/03/14  . Complication of anesthesia   . PONV (postoperative nausea and vomiting)   . GERD (gastroesophageal reflux disease)   . Diverticulitis   . Kidney cysts   . Persistent cough   . History of skin cancer   . Deviated nasal septum   . Anxiety   . Cancer ([place])     hx skin cancer  . Ventral hernia   . Congestion of both ears   . Pneumonia     july 2016, pneumonia-06/2015   . Osteoarthritis     fingers   . Osteoporosis    Past Surgical History  Procedure Laterality Date  . Abdominal hysterectomy  1972  . Oophorectomy  1976  . Tonsillectomy    . Lipoma resection      left scapula area  . Reduction mammaplasty  1994  . Rhinoplasty  1985  . Facial cosmetic surgery  2009    [name] MD in [place]  . Laparoscopic partial colectomy N/A 05/03/2014    Procedure: DIAGNOSTIC LAPAROSCOPY WITH DRAINAGE OF INTRA ABDOMINAL ABSCESS PARTIAL COLECTOMY ;  Surgeon: [name], MD;  Location: [facility];  Service: General;  Laterality: N/A;  supine position  . Laparotomy N/A 05/03/2014    Procedure: EXPLORATORY LAPAROTOMY WITH HARTMAN PROCEDURE;  Surgeon: [name], MD;  Location: [facility];  Service: General;  Laterality: N/A;  . [place] loop colostomy closure N/A 11/08/2014    Procedure: LAPAROSCOPIC COLOSTOMY CLOSURE WITH PARTIAL COLECTOMY;  Surgeon: [name], MD;  Location: [facility];  Service: General;  Laterality: N/A;  . Laparoscopic lysis of adhesions N/A 11/08/2014    Procedure: LAPAROSCOPIC  LYSIS OF ADHESIONS FOR 90 MINUTES;  Surgeon: [name], MD;  Location: [facility];  Service: General;  Laterality: N/A;  . Ventral hernia repair N/A 08/01/2015    Procedure: LAPAROSCOPIC VENTRAL INCISIONAL  HERNIA;  Surgeon: [name], MD;  Location: [facility];  Service: General;  Laterality: N/A;  . Insertion of mesh N/A 08/01/2015    Procedure: INSERTION OF MESH;  Surgeon: [name], MD;  Location: [facility];  Service: General;  Laterality: N/A;  . Laparoscopic lysis of adhesions N/A 08/01/2015    Procedure: LAPAROSCOPIC LYSIS OF ADHESIONS;  Surgeon: [name], MD;  Location: [facility];  Service: General;  Laterality: N/A;    reports that she has never smoked. She has never used smokeless tobacco. She reports that she does not drink alcohol or use illicit drugs. family history includes Arthritis in her mother; Cancer in her maternal grandfather; Colon cancer (age of onset: 17) in her mother; Dementia in her father; Diabetes in her sister; Heart disease in her father. There is no history of Stomach cancer. Allergies  Allergen Reactions  . Codeine Nausea And Vomiting  . Dilaudid [Hydromorphone Hcl] Other (See Comments)    "too strong - I can't wake up"  .  Sulfonamide Derivatives Other (See Comments)    Childhood allergy; reaction unknown  . Tape Other (See Comments)    REDDNESS, BLISTER/  Use paper tape  . Norco [Hydrocodone-Acetaminophen] Rash     Outpatient Prescriptions Prior to Visit  Medication Sig Dispense Refill  . acetaminophen (TYLENOL) 325 MG tablet Take 650 mg by mouth every 6 (six) hours as needed.    . benzonatate (TESSALON) 200 MG capsule TAKE 1 CAPSULE TWICE DAILY AS NEEDED FOR COUGH. 20 capsule 0  . benzonatate (TESSALON) 200 MG capsule Take 1 capsule (200 mg total) by mouth 2 (two) times daily as needed for cough. 20 capsule 0  . budesonide (RHINOCORT ALLERGY) 32 MCG/ACT nasal spray Place 1 spray into both nostrils daily.    . calcium carbonate (OS-CAL) 600 MG  TABS tablet Take 600 mg by mouth 2 (two) times daily with a meal. Reported on 06/16/2015    . cefdinir (OMNICEF) 300 MG capsule Take 1 capsule (300 mg total) by mouth 2 (two) times daily. (Patient taking differently: Take 300 mg by mouth 2 (two) times daily. Completed on 07/10/2015) 20 capsule 0  . Cholecalciferol 1000 UNITS tablet Take 2,000 Units by mouth 2 (two) times daily. Reported on 07/03/2015    . diazepam (VALIUM) 5 MG tablet Take 2.5 mg by mouth every 6 (six) hours as needed for anxiety.    . fluticasone (FLONASE) 50 MCG/ACT nasal spray Place into both nostrils daily.    . fluticasone furoate-vilanterol (BREO ELLIPTA) 100-25 MCG/INH AEPB Inhale 1 puff into the lungs daily. (Patient taking differently: Inhale 1 puff into the lungs daily. Completed on 07/10/2015) 1 each 5  . Multiple Vitamin (MULTIVITAMIN) tablet Take 1 tablet by mouth daily.    . ondansetron (ZOFRAN ODT) 4 MG disintegrating tablet Take 1 tablet (4 mg total) by mouth every 6 (six) hours as needed for nausea or vomiting. 30 tablet 0  . OVER THE COUNTER MEDICATION guiafenesin - sudafed er 600mg /60 mg as needed    . Oxymetazoline HCl (AFRIN NASAL SPRAY NA) Place into the nose. As needed    . pantoprazole (PROTONIX) 40 MG tablet Take 1 tablet (40 mg total) by mouth daily. 30 tablet 6   No facility-administered medications prior to visit.    ROS Review of Systems  Constitutional: Positive for fatigue. Negative for chills, activity change, appetite change and unexpected weight change.  HENT: Negative for congestion, mouth sores and sinus pressure.   Eyes: Negative for visual disturbance.  Respiratory: Positive for cough, chest tightness and shortness of breath. Negative for wheezing.   Cardiovascular: Positive for chest pain and leg swelling. Negative for palpitations.  Gastrointestinal: Negative for nausea and abdominal pain.  Genitourinary: Negative for frequency, difficulty urinating and vaginal pain.  Musculoskeletal: Negative  for back pain and gait problem.  Skin: Negative for pallor and rash.  Neurological: Positive for weakness. Negative for dizziness, tremors, numbness and headaches.  Psychiatric/Behavioral: Negative for confusion and sleep disturbance.    Objective:  BP 140/80 mmHg  Pulse 125  SpO2 95%  BP Readings from Last 3 Encounters:  08/23/15 140/80  08/06/15 103/52  07/26/15 148/69    Wt Readings from Last 3 Encounters:  08/01/15 125 lb (56.7 kg)  07/26/15 125 lb (56.7 kg)  07/03/15 121 lb (54.885 kg)    Physical Exam  Constitutional: She appears well-developed. No distress.  HENT:  Head: Normocephalic.  Right Ear: External ear normal.  Left Ear: External ear normal.  Nose: Nose normal.  Mouth/Throat: Oropharynx  is clear and moist.  Eyes: Conjunctivae are normal. Pupils are equal, round, and reactive to light. Right eye exhibits no discharge. Left eye exhibits no discharge.  Neck: Normal range of motion. Neck supple. No JVD present. No tracheal deviation present. No thyromegaly present.  Cardiovascular: Regular rhythm and normal heart sounds.   Pulmonary/Chest: No stridor. No respiratory distress. She has no wheezes.  Abdominal: Soft. Bowel sounds are normal. She exhibits no distension and no mass. There is no tenderness. There is no rebound and no guarding.  Musculoskeletal: She exhibits edema. She exhibits no tenderness.  Lymphadenopathy:    She has no cervical adenopathy.  Neurological: She displays normal reflexes. No cranial nerve deficit. She exhibits normal muscle tone. Coordination normal.  Skin: No rash noted. No erythema.  Psychiatric: She has a normal mood and affect. Her behavior is normal. Judgment and thought content normal.  dyspneic, tachycardic RLE w/trace edema, sensitive  Lab Results  Component Value Date   WBC 8.5 08/02/2015   HGB 11.2* 08/02/2015   HCT 34.9* 08/02/2015   PLT 255 08/02/2015   GLUCOSE 137* 08/02/2015   CHOL 186 04/14/2013   TRIG 193*  05/09/2014   HDL 51.90 04/14/2013   LDLCALC 104* 04/14/2013   ALT 11* 06/29/2015   AST 15 06/29/2015   NA 138 08/02/2015   K 4.7 08/02/2015   CL 111 08/02/2015   CREATININE 0.98 08/02/2015   BUN 13 08/02/2015   CO2 24 08/02/2015   TSH 1.50 01/19/2015   INR 1.13 06/29/2015   HGBA1C 6.0* 11/03/2014    US Venous Img Lower Unilateral Right  08/23/2015  CLINICAL DATA:  Right lower extremity pain and swelling. Evaluate for DVT. EXAM: Right LOWER EXTREMITY VENOUS DOPPLER ULTRASOUND TECHNIQUE: Gray-scale sonography with graded compression, as well as color Doppler and duplex ultrasound were performed to evaluate the lower extremity deep venous systems from the level of the common femoral vein and including the common femoral, femoral, profunda femoral, popliteal and calf veins including the posterior tibial, peroneal and gastrocnemius veins when visible. The superficial great saphenous vein was also interrogated. Spectral Doppler was utilized to evaluate flow at rest and with distal augmentation maneuvers in the common femoral, femoral and popliteal veins. COMPARISON:  None. FINDINGS: Expanded veins with internal echoes affecting the distal right femoral vein, popliteal vein, and posterior tibial/peroneal calf veins consistent with occlusive thrombus. No internal color Doppler flow. Above the clot deep veins are patent with symmetric respiratory phasicity. These results will be called to the ordering clinician or representative by the Radiology Department at the imaging location prior to patient disposition. IMPRESSION: Positive for occlusive deep venous thrombosis from the distal right femoral vein into the calf. Electronically Signed   By: Monte Fantasia M.D.   On: 08/23/2015 13:53    Assessment & Plan:   There are no diagnoses linked to this encounter. I am having Ms. [name] maintain her Cholecalciferol, calcium carbonate, multivitamin, pantoprazole, ondansetron, diazepam, cefdinir, fluticasone  furoate-vilanterol, Oxymetazoline HCl (AFRIN NASAL SPRAY NA), fluticasone, OVER THE COUNTER MEDICATION, budesonide, acetaminophen, benzonatate, benzonatate, and ibuprofen.  Meds ordered this encounter  Medications  . ibuprofen (ADVIL,MOTRIN) 200 MG tablet    Sig: Take 200 mg by mouth 2 (two) times daily.     Follow-up: No Follow-up on file.  Walker Kehr, MD

## 2015-08-23 NOTE — Sedation Documentation (Signed)
Right Main 55/19 (34) pressures

## 2015-08-23 NOTE — Assessment & Plan Note (Signed)
R sided - r/o PE  To [facility] STAT

## 2015-08-23 NOTE — Procedures (Signed)
Successful initiation of bilateral ultrasound assisted pulmonary arterial thrombolysis for submassive PE. Main PE measurements: 55/19 (mean - 34) Access: R CFV (x2) No immediate post procedural complications.  Ronny Bacon, MD Pager #: [phone]

## 2015-08-23 NOTE — Telephone Encounter (Signed)
  [facility] called and said she has +DVT in leg/Patient also while on the phone with them showed up to the office.

## 2015-08-23 NOTE — ED Notes (Signed)
ECHO tech at bedside.

## 2015-08-23 NOTE — Consult Note (Signed)
PULMONARY / CRITICAL CARE MEDICINE   Name: Kristen Castillo MRN: [id number] DOB: [birth year]    ADMISSION DATE:  08/23/2015 CONSULTATION DATE:  08/23/2015  REFERRING MD:  Dr. 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 Colostomy was reversed in 10/2014. She had several abdominal hernias due to surgeries which were repaired 5/23 2017, she was discharged 5/28. Since time of discharge she has been having R leg cramping and swelling. She also developed shortness of breath and productive cough 2 days prior to arrival. Also with R sided shoulder pain. She made these complaints to her PCP who scheduled her for RLE doppler, which demonstrated DVT. She was very SOB during the doppler and tech was concerned that she had PE and referred her back to PCP, who sent her to ED. In ED she was found to be tachycardic, dyspneic, and tachypneic. With known DVT she was sent for CTA chest, which demonstrated saddle PE with large clot burden. RV to LV ratio on CT was measured at 1.7. Bedside US in ED demonstrated significant R heart strain. EKG concerning for R heart strain as well with S1Q3 and ?T3. She was planned for admission to SDU under the hospitalist team and pulmonary was consulted.   PAST MEDICAL HISTORY :  She  has a past medical history of Hematuria; Hair loss; Unspecified vitamin D deficiency; Migraines; Osteoporosis; History of creation of ostomy; Complication of anesthesia; PONV (postoperative nausea and vomiting); GERD (gastroesophageal reflux disease); Diverticulitis; Kidney cysts; Persistent cough; History of skin cancer; Deviated nasal septum; Anxiety; Cancer ([place]); Ventral hernia; Congestion of both ears; Pneumonia; Osteoarthritis; and Osteoporosis.  PAST SURGICAL HISTORY: She  has past surgical history that includes Abdominal hysterectomy (1972); Oophorectomy  (1976); Tonsillectomy; Lipoma resection; Reduction mammaplasty (1994); Rhinoplasty (1985); Facial cosmetic surgery (2009); Laparoscopic partial colectomy (N/A, 05/03/2014); laparotomy (N/A, 05/03/2014); Ileo loop colostomy closure (N/A, 11/08/2014); Laparoscopic lysis of adhesions (N/A, 11/08/2014); Ventral hernia repair (N/A, 08/01/2015); Insertion of mesh (N/A, 08/01/2015); and Laparoscopic lysis of adhesions (N/A, 08/01/2015).  Allergies  Allergen Reactions  . Codeine Nausea And Vomiting  . Dilaudid [Hydromorphone Hcl] Other (See Comments)    "too strong - I can't wake up"  . Sulfonamide Derivatives Other (See Comments)    Childhood allergy; reaction unknown  . Tape Other (See Comments)    REDDNESS, BLISTER/  Use paper tape  . Norco [Hydrocodone-Acetaminophen] Rash    No current facility-administered medications on file prior to encounter.   Current Outpatient Prescriptions on File Prior to Encounter  Medication Sig  . benzonatate (TESSALON) 200 MG capsule Take 1 capsule (200 mg total) by mouth 2 (two) times daily as needed for cough.  . budesonide (RHINOCORT ALLERGY) 32 MCG/ACT nasal spray Place 1 spray into both nostrils daily.  . fluticasone (FLONASE) 50 MCG/ACT nasal spray Place into both nostrils daily.  Marland Kitchen ibuprofen (ADVIL,MOTRIN) 200 MG tablet Take 200 mg by mouth 2 (two) times daily.  . pantoprazole (PROTONIX) 40 MG tablet Take 1 tablet (40 mg total) by mouth daily.  . benzonatate (TESSALON) 200 MG capsule TAKE 1 CAPSULE TWICE DAILY AS NEEDED FOR COUGH. (Patient not taking: Reported on 08/23/2015)  . calcium carbonate (OS-CAL) 600 MG TABS tablet Take 600 mg by mouth 2 (two) times daily with a meal. Reported on 06/16/2015  . cefdinir (OMNICEF) 300 MG capsule Take 1 capsule (300 mg total) by mouth  2 (two) times daily. (Patient not taking: Reported on 08/23/2015)  . Cholecalciferol 1000 UNITS tablet Take 2,000 Units by mouth 2 (two) times daily. Reported on 07/03/2015  . fluticasone  furoate-vilanterol (BREO ELLIPTA) 100-25 MCG/INH AEPB Inhale 1 puff into the lungs daily. (Patient not taking: Reported on 08/23/2015)  . Multiple Vitamin (MULTIVITAMIN) tablet Take 1 tablet by mouth daily.  . ondansetron (ZOFRAN ODT) 4 MG disintegrating tablet Take 1 tablet (4 mg total) by mouth every 6 (six) hours as needed for nausea or vomiting.  Marland Kitchen OVER THE COUNTER MEDICATION guiafenesin - sudafed er 600mg /60 mg as needed    FAMILY HISTORY:  Her indicated that her mother is deceased. She indicated that her father is deceased. She indicated that her sister is alive.   SOCIAL HISTORY: She  reports that she has never smoked. She has never used smokeless tobacco. She reports that she does not drink alcohol or use illicit drugs.  REVIEW OF SYSTEMS:   Bolds are positive  Constitutional: weight loss, gain, night sweats, Fevers, chills, fatigue .  HEENT: headaches, Sore throat, sneezing, nasal congestion, post nasal drip, Difficulty swallowing, Tooth/dental problems, visual complaints visual changes, ear ache CV:  chest pain, radiates: ,Orthopnea, PND, swelling in Right lower extremity, dizziness, palpitations, syncope.  GI  heartburn, indigestion, abdominal pain, nausea, vomiting, diarrhea, change in bowel habits, loss of appetite, bloody stools.  Resp: cough, productive: , hemoptysis, dyspnea, chest pain, pleuritic (r scapula area).  Skin: rash or itching or icterus GU: dysuria, change in color of urine, urgency or frequency. flank pain, hematuria  MS: joint pain or swelling. decreased range of motion  Psych: change in mood or affect. depression or anxiety.  Neuro: difficulty with speech, weakness, numbness, ataxia    SUBJECTIVE: As above.   VITAL SIGNS: BP 161/74 mmHg  Pulse 118  Temp(Src) 97.7 F (36.5 C) (Oral)  Resp 23  Ht 5' 2.25" (1.581 m)  Wt 53.978 kg (119 lb)  BMI 21.59 kg/m2  SpO2 96%  HEMODYNAMICS:    VENTILATOR SETTINGS:    INTAKE / OUTPUT:    PHYSICAL  EXAMINATION: General:  Female of normal body habitus in NAD Neuro:  Alert, oriented, non-focal HEENT:  [place]/AT, PERRL, no JVD Cardiovascular:  Tachy, regular, no JVD Lungs:  Tachypnea and increased WOB on 3 L [place]. Clear bilateral breath sounds. Abdomen:  Soft, non-tender, non-distended. Multiple scars (some recent) representing laproscopy Musculoskeletal:  No acute deformity or ROM limitation Skin:  Grossly intact with exception of abdominal scars.   LABS:  BMET  Recent Labs Lab 08/23/15 1545 08/23/15 1549  NA 138 140  K 3.4* 3.5  CL 106 108  CO2 20*  --   BUN 27* 27*  CREATININE 0.96 0.90  GLUCOSE 144* 141*    Electrolytes  Recent Labs Lab 08/23/15 1545  CALCIUM 9.4    CBC  Recent Labs Lab 08/23/15 1545 08/23/15 1549  WBC 20.4*  --   HGB 12.9 14.3  HCT 39.4 42.0  PLT 392  --     Coag's  Recent Labs Lab 08/23/15 1545  APTT 27  INR 1.27    Sepsis Markers No results for input(s): LATICACIDVEN, PROCALCITON, O2SATVEN in the last 168 hours.  ABG No results for input(s): PHART, PCO2ART, PO2ART in the last 168 hours.  Liver Enzymes No results for input(s): AST, ALT, ALKPHOS, BILITOT, ALBUMIN in the last 168 hours.  Cardiac Enzymes No results for input(s): TROPONINI, PROBNP in the last 168 hours.  Glucose No results for  input(s): GLUCAP in the last 168 hours.  Imaging Ct Angio Chest Pe W/cm &/or Wo Cm  08/23/2015  CLINICAL DATA:  Shortness of breath. Right lower extremity deep venous thrombosis. Recent surgery. EXAM: CT ANGIOGRAPHY CHEST WITH CONTRAST TECHNIQUE: Multidetector CT imaging of the chest was performed using the standard protocol during bolus administration of intravenous contrast. Multiplanar CT image reconstructions and MIPs were obtained to evaluate the vascular anatomy. CONTRAST:  100 cc Isovue 370 IV. COMPARISON:  07/26/2015 chest radiograph.  No prior chest CT. FINDINGS: Mediastinum/Nodes: The study is high quality for the evaluation of  pulmonary embolism. There is an acute saddle pulmonary embolus. There is a large burden of acute pulmonary embolism involving lobar, segmental and subsegmental branches of all lung lobes in both lungs. Minimally atherosclerotic nonaneurysmal thoracic aorta. Mildly dilated main pulmonary artery (3.1 cm diameter). Mild cardiomegaly with dilated right ventricle and right atrium (RV-LV ratio 1.7). No pericardial fluid/thickening. Normal visualized thyroid. Normal esophagus. No pathologically enlarged axillary, mediastinal or hilar lymph nodes. Lungs/Pleura: No pneumothorax. No pleural effusion. Wedge-shaped small subpleural focus of ground-glass opacity in the basilar medial right upper lobe (series 7/ image 44). Posterior right upper lobe 3 mm pulmonary nodule (series 7/ image 27). Irregular 1.6 x 0.7 cm focus of consolidation in the posterior left upper lobe (series 7/ image 35). Additional patchy foci of ground-glass opacity in peribronchovascular left upper lobe. Subsegmental atelectasis in both lower lobes. Upper abdomen: Unremarkable. Musculoskeletal: No aggressive appearing focal osseous lesions. Mild degenerative changes in the thoracic spine. Review of the MIP images confirms the above findings. IMPRESSION: 1. Large burden acute pulmonary embolism with saddle pulmonary embolus. Pulmonary emboli involves lobar, segmental and subsegmental branches of all lung lobes in both lungs. 2. Dilated right atrium and right ventricle. Positive for acute PE with CT evidence of right heart strain (RV/LV Ratio = 1.7) consistent with at least submassive (intermediate risk) PE. The presence of right heart strain has been associated with an increased risk of morbidity and mortality. Please activate Code PE by paging [phone]. 3. Wedge-shaped small subpleural focus of ground-glass opacity in the basilar medial right upper lobe, probably a small pulmonary infarct. Additional mild patchy areas of ground-glass opacity in the left  upper lobe probably represent areas of mild alveolar hemorrhage. 4. Additional irregular 1.6 cm focus of consolidation in the posterior left upper lobe, which is indeterminate. A follow-up chest CT is recommended in 3 months with attention to this focus. Critical Value/emergent results were called by telephone at the time of interpretation on 08/23/2015 at 5:25 pm to Dr. Leo Grosser , who verbally acknowledged these results. Electronically Signed   By: Ilona Sorrel M.D.   On: 08/23/2015 17:37   US Venous Img Lower Unilateral Right  08/23/2015  CLINICAL DATA:  Right lower extremity pain and swelling. Evaluate for DVT. EXAM: Right LOWER EXTREMITY VENOUS DOPPLER ULTRASOUND TECHNIQUE: Gray-scale sonography with graded compression, as well as color Doppler and duplex ultrasound were performed to evaluate the lower extremity deep venous systems from the level of the common femoral vein and including the common femoral, femoral, profunda femoral, popliteal and calf veins including the posterior tibial, peroneal and gastrocnemius veins when visible. The superficial great saphenous vein was also interrogated. Spectral Doppler was utilized to evaluate flow at rest and with distal augmentation maneuvers in the common femoral, femoral and popliteal veins. COMPARISON:  None. FINDINGS: Expanded veins with internal echoes affecting the distal right femoral vein, popliteal vein, and posterior tibial/peroneal calf veins consistent  with occlusive thrombus. No internal color Doppler flow. Above the clot deep veins are patent with symmetric respiratory phasicity. These results will be called to the ordering clinician or representative by the Radiology Department at the imaging location prior to patient disposition. IMPRESSION: Positive for occlusive deep venous thrombosis from the distal right femoral vein into the calf. Electronically Signed   By: Monte Fantasia M.D.   On: 08/23/2015 13:53    STUDIES:  CTA chest 6/14:  Large  saddle pulmonary embolus with bilateral significant clot burden. RV/LV 1.7. Right lower lobe pulmonary infarct. 1.6 cm nodule left upper lobe. LE dopplers 6/14:  Occlusive DVT distal right femoral vein into the calf. TTE 6/14:  EF 55-60 percent with normal wall motion. No aortic stenosis or regurgitation. No mitral stenosis or regurgitation. RV dilated with moderately reviewed to severely reduced systolic function. Flattening of the intraventricular septum consistent with RV volume and pressure overload. Pulmonary artery systolic pressure 53 mmHg. LA normal in size. RA mildly dilated.  MICROBIOLOGY: MRSA PCR >>  ANTIBIOTICS: NONE  SIGNIFICANT EVENTS: 04/2014 diverticulitis with perf. 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 Concern for RV strain on CT and bedside echo in ED. Admitted to ICU for likely catheter directed thrombolysis.   ASSESSMENT / PLAN:  HEMATOLOGIC A:   Acute saddle pulmonary embolism RLE DVT R arm pain concern for further DVT  P:  Heparin infusion per pharmacy protocol IR contacted and planning on catheter directed lysis of obvious pointing for respiratory, Consider filter which could be placed while removing EKOS catheters STAT echo pending > cardiology has been contacted and planning to read.  Will need ICU admission for EKOS Lactic and troponin pending Stat type & screen  PULMONARY A: Acute hypoxemic respiratory failure secondary to PE H/o recurrent PNA thought secondary to GERD  P:   Supplemental O2 to keep SpO2 > 92% Tessalon Perles prn cough See Heme  CARDIOVASCULAR A:  Tachycardia  P:  Telemetry monitoring in ICU Vitals per unit protocol  RENAL A:   Hypokalemia - Replacing IV.  P:   Follow electrolytes & renal  function Trending UOP Correct electrolytes as indicated Magnesium add-on to previous collection KCl 10 mEq IV x3 runs  GASTROINTESTINAL A:   Recent surgical repair of hernias GERD with esophagitis H/o diverticulitis with perforation s/p colostomy and revision 2016  P:   Protonix EC for SUP NPO pending EKOS  INFECTIOUS A:   Leukocytosis - Likely reactive/demargination.  P:   Monitor off ABX Trend WBC and fever curve  ENDOCRINE A:   No acute issues  P:   Follow glucose on daily chemistry  NEUROLOGIC A:   No acute issues  P:   Monitor   FAMILY  - Updates: Patient and husband and updated at length by [facility] in ED. Wish to move forward with EKOS if IR agrees.   - Inter-disciplinary family meet or Palliative Care meeting due by:  6/21   Georgann Housekeeper, AGACNP-BC [place] [facility]/Critical Care Pager [phone] or [phone]  08/23/2015 7:03 PM   PCCM Attending Note: Patient seen & examined with nurse practitioner. Please refer to his history & physical which I have reviewed. Patient status post ventral hernia repair with mesh placement on 5/23. Reports right lower extremity pain/cramping shortly  after discharge and for the last 2 weeks. Patient reports cough productive of intermittent clear mucus. Also describes pleuritic right chest pain. Denies any chest pressure, tightness, or pain at present. Reports dyspnea has improved slightly since arrival in the emergency department. Denies any syncope or near syncope. No recent trauma. No history of ulcers or gastrointestinal bleeding. No history of stroke. The patient significantly elevated cardiac biomarkers and findings on transthoracic echocardiogram with right ventricular pressure overload with the patient's high clot burden I do believe it is reasonable to consider and proceed with catheter directed thrombolytic therapy for her large pulmonary embolus. I discussed the plan of care at length with the patient and her  husband at bedside. I explained the potential risk of lack of treatment as well as the potential for seen and on scene bleeding including intracranial bleeding and death. The patient's abdominal surgery approximately 3 weeks ago is a relative contraindication and I did communicate this to the patient and her husband. Interventional radiology has been contacted and will assess the patient upon arrival from [facility] ED. patient has expressed a full CODE STATUS. Will monitor the patient closely in the intensive care unit overnight.  I have personally spent a total of 45 minutes of critical care time caring for the patient, discussing the plan of care with patient & husband at bedside, reviewing the plan of care with [facility] MD, and reviewing the patient's electronic medical record.  Sonia Baller Ashok Cordia, M.D. [facility]monary [facility] Critical [facility] Pager:  [phone] After 3pm or if no response, call [phone] 9:06 PM 08/23/2015

## 2015-08-23 NOTE — Progress Notes (Signed)
No Name Progress Note Patient Name: Kristen Castillo DOB: 1940/09/27 MRN: [id number]   Date of Service  08/23/2015  HPI/Events of Note  New patient with large PE, saddle on CT with RV strain, tachycardic, tachypneic (breathing 40 times per minute) Recent surgery   eICU Interventions  Based on clinical impression of critical condition I agree with IR thrombolysis if no contra-indication.  Will order stat echocardiogram now to evaluate RV strain.     Intervention Category Intermediate Interventions: Respiratory distress - evaluation and management  Simonne Maffucci 08/23/2015, 6:10 PM

## 2015-08-23 NOTE — ED Notes (Signed)
Urine sample sent to lab

## 2015-08-23 NOTE — ED Provider Notes (Signed)
CSN: [id number]     Arrival date & time 08/23/15  1521 History   First MD Initiated Contact with Patient 08/23/15 1538     Chief Complaint  Patient presents with  . Shortness of Breath  . DVT     (Consider location/radiation/quality/duration/timing/severity/associated sxs/prior Treatment) Patient is a 75 y.o. female presenting with shortness of breath. The history is provided by the patient.  Shortness of Breath Severity:  Moderate Onset quality:  Gradual Duration:  1 week Timing:  Constant Progression:  Worsening Chronicity:  New Relieved by:  Nothing Worsened by:  Nothing tried Ineffective treatments: cough syrup. Associated symptoms: cough   Associated symptoms: no abdominal pain, no fever, no hemoptysis and no vomiting   Risk factors: recent surgery (5/23 hernia repair )   Risk factors: no family hx of DVT     Past Medical History  Diagnosis Date  . Hematuria     in past  . Hair loss   . Unspecified vitamin D deficiency   . Migraines     in past  . Osteoporosis   . History of creation of ostomy     past partial colectomy in 05/03/14  . Complication of anesthesia   . PONV (postoperative nausea and vomiting)   . GERD (gastroesophageal reflux disease)   . Diverticulitis   . Kidney cysts   . Persistent cough   . History of skin cancer   . Deviated nasal septum   . Anxiety   . Cancer ([place])     hx skin cancer  . Ventral hernia   . Congestion of both ears   . Pneumonia     july 2016, pneumonia-06/2015   . Osteoarthritis     fingers   . Osteoporosis    Past Surgical History  Procedure Laterality Date  . Abdominal hysterectomy  1972  . Oophorectomy  1976  . Tonsillectomy    . Lipoma resection      left scapula area  . Reduction mammaplasty  1994  . Rhinoplasty  1985  . Facial cosmetic surgery  2009    [name] MD in [place]  . Laparoscopic partial colectomy N/A 05/03/2014    Procedure: DIAGNOSTIC LAPAROSCOPY WITH DRAINAGE OF INTRA ABDOMINAL ABSCESS  PARTIAL COLECTOMY ;  Surgeon: [name], MD;  Location: [facility];  Service: General;  Laterality: N/A;  supine position  . Laparotomy N/A 05/03/2014    Procedure: EXPLORATORY LAPAROTOMY WITH HARTMAN PROCEDURE;  Surgeon: [name], MD;  Location: [facility];  Service: General;  Laterality: N/A;  . Ileo loop colostomy closure N/A 11/08/2014    Procedure: LAPAROSCOPIC COLOSTOMY CLOSURE WITH PARTIAL COLECTOMY;  Surgeon: [name], MD;  Location: [facility];  Service: General;  Laterality: N/A;  . Laparoscopic lysis of adhesions N/A 11/08/2014    Procedure: LAPAROSCOPIC LYSIS OF ADHESIONS FOR 90 MINUTES;  Surgeon: [name], MD;  Location: [facility];  Service: General;  Laterality: N/A;  . Ventral hernia repair N/A 08/01/2015    Procedure: LAPAROSCOPIC VENTRAL INCISIONAL  HERNIA;  Surgeon: [name], MD;  Location: [facility];  Service: General;  Laterality: N/A;  . Insertion of mesh N/A 08/01/2015    Procedure: INSERTION OF MESH;  Surgeon: [name], MD;  Location: [facility];  Service: General;  Laterality: N/A;  . Laparoscopic lysis of adhesions N/A 08/01/2015    Procedure: LAPAROSCOPIC LYSIS OF ADHESIONS;  Surgeon: [name], MD;  Location: [facility];  Service: General;  Laterality: N/A;   Family History  Problem Relation Age of  Onset  . Arthritis Mother   . Colon cancer Mother 21  . Heart disease Father   . Dementia Father   . Diabetes Sister   . Cancer Maternal Grandfather     Lung cancer  . Stomach cancer Neg Hx    Social History  Substance Use Topics  . Smoking status: Never Smoker   . Smokeless tobacco: Never Used  . Alcohol Use: No   OB History    No data available     Review of Systems  Constitutional: Negative for fever.  Respiratory: Positive for cough and shortness of breath. Negative for hemoptysis.   Gastrointestinal: Negative for vomiting and abdominal pain.  All other systems reviewed and are negative.     Allergies  Codeine; Dilaudid; Sulfonamide  derivatives; Tape; and Norco  Home Medications   Prior to Admission medications   Medication Sig Start Date End Date Taking? Authorizing Provider  acetaminophen (TYLENOL) 325 MG tablet Take 650 mg by mouth every 6 (six) hours as needed.    Historical Provider, MD  benzonatate (TESSALON) 200 MG capsule TAKE 1 CAPSULE TWICE DAILY AS NEEDED FOR COUGH. 08/15/15   Cassandria Anger, MD  benzonatate (TESSALON) 200 MG capsule Take 1 capsule (200 mg total) by mouth 2 (two) times daily as needed for cough. 08/15/15   Evie Lacks Plotnikov, MD  budesonide (RHINOCORT ALLERGY) 32 MCG/ACT nasal spray Place 1 spray into both nostrils daily.    Historical Provider, MD  calcium carbonate (OS-CAL) 600 MG TABS tablet Take 600 mg by mouth 2 (two) times daily with a meal. Reported on 06/16/2015    Historical Provider, MD  cefdinir (OMNICEF) 300 MG capsule Take 1 capsule (300 mg total) by mouth 2 (two) times daily. Patient taking differently: Take 300 mg by mouth 2 (two) times daily. Completed on 07/10/2015 06/30/15   Cassandria Anger, MD  Cholecalciferol 1000 UNITS tablet Take 2,000 Units by mouth 2 (two) times daily. Reported on 07/03/2015    Historical Provider, MD  diazepam (VALIUM) 5 MG tablet Take 2.5 mg by mouth every 6 (six) hours as needed for anxiety.    Historical Provider, MD  fluticasone (FLONASE) 50 MCG/ACT nasal spray Place into both nostrils daily.    Historical Provider, MD  fluticasone furoate-vilanterol (BREO ELLIPTA) 100-25 MCG/INH AEPB Inhale 1 puff into the lungs daily. Patient taking differently: Inhale 1 puff into the lungs daily. Completed on 07/10/2015 06/30/15   Evie Lacks Plotnikov, MD  ibuprofen (ADVIL,MOTRIN) 200 MG tablet Take 200 mg by mouth 2 (two) times daily.    Historical Provider, MD  Multiple Vitamin (MULTIVITAMIN) tablet Take 1 tablet by mouth daily.    Historical Provider, MD  ondansetron (ZOFRAN ODT) 4 MG disintegrating tablet Take 1 tablet (4 mg total) by mouth every 6 (six) hours as  needed for nausea or vomiting. 11/15/14   [name], MD  OVER THE COUNTER MEDICATION guiafenesin - sudafed er 600mg /60 mg as needed    Historical Provider, MD  Oxymetazoline HCl (AFRIN NASAL SPRAY NA) Place into the nose. As needed    Historical Provider, MD  pantoprazole (PROTONIX) 40 MG tablet Take 1 tablet (40 mg total) by mouth daily. 10/18/14   Amy S Esterwood, PA-C   BP 164/78 mmHg  Pulse 131  Temp(Src) 97.7 F (36.5 C) (Oral)  Resp 27  SpO2 96% Physical Exam  Constitutional: She is oriented to person, place, and time. She appears well-developed and well-nourished. She appears toxic. She appears distressed.  HENT:  Head: Normocephalic.  Eyes: Conjunctivae are normal.  Neck: Neck supple. No tracheal deviation present.  Cardiovascular: Regular rhythm and normal heart sounds.  Tachycardia present.   Pulmonary/Chest: Effort normal and breath sounds normal. No accessory muscle usage. Tachypnea noted. No respiratory distress.  Abdominal: Soft. She exhibits no distension.  Musculoskeletal:       Right knee: Tenderness (in popliteal fossa) found.  Neurological: She is alert and oriented to person, place, and time.  Skin: Skin is warm and dry.  Psychiatric: She has a normal mood and affect.    ED Course  Procedures (including critical care time)  CRITICAL CARE Performed by: Leo Grosser Total critical care time: 30 minutes Critical care time was exclusive of separately billable procedures and treating other patients. Critical care was necessary to treat or prevent imminent or life-threatening deterioration. Critical care was time spent personally by me on the following activities: development of treatment plan with patient and/or surrogate as well as nursing, discussions with consultants, evaluation of patient's response to treatment, examination of patient, obtaining history from patient or surrogate, ordering and performing treatments and interventions, ordering and review of  laboratory studies, ordering and review of radiographic studies, pulse oximetry and re-evaluation of patient's condition.   Emergency Focused Ultrasound Exam Limited Ultrasound of the Heart and Pericardium  Performed and interpreted by Dr. Laneta Simmers Indication: shortness of breath Multiple views of the heart, pericardium, and IVC are obtained with a multi frequency probe.  Findings: nml contractility, no anechoic fluid, minimal IVC collapse, dilated RV with paradoxical septal motion and + D-sign Interpretation: nml ejection fraction, np pericardial effusion, no depressed CVP, evidence of acute right heart strain Images archived electronically.  CPT Code: 7636770449  Emergency Focused Ultrasound Exam Limited Ultrasound of Lower Extremity for DVT  Performed and interpreted by Dr. Laneta Simmers Indication: Leg swelling Transverse views of right lower extremity are obtained in real time for the purposes of evaluation for deep venous thrombosis.  Findings: + collapsible proximal femoral vein, no collapsible popliteal vein Interpretation: + popliteal deep venous thrombosis Images archived electronically.  CPT Code:   [id number]   Labs Review Labs Reviewed  CBC WITH DIFFERENTIAL/PLATELET - Abnormal; Notable for the following:    WBC 20.4 (*)    Neutro Abs 16.8 (*)    Monocytes Absolute 1.4 (*)    All other components within normal limits  BASIC METABOLIC PANEL - Abnormal; Notable for the following:    Potassium 3.4 (*)    CO2 20 (*)    Glucose, Bld 144 (*)    BUN 27 (*)    GFR calc non Af Amer 57 (*)    All other components within normal limits  PROTIME-INR - Abnormal; Notable for the following:    Prothrombin Time 15.5 (*)    All other components within normal limits  I-STAT CHEM 8, ED - Abnormal; Notable for the following:    BUN 27 (*)    Glucose, Bld 141 (*)    All other components within normal limits  APTT  HEPARIN LEVEL (UNFRACTIONATED)  CBC  BASIC METABOLIC PANEL  TROPONIN I   TROPONIN I  TROPONIN I  LACTIC ACID, PLASMA    Imaging Review Ct Angio Chest Pe W/cm &/or Wo Cm  08/23/2015  CLINICAL DATA:  Shortness of breath. Right lower extremity deep venous thrombosis. Recent surgery. EXAM: CT ANGIOGRAPHY CHEST WITH CONTRAST TECHNIQUE: Multidetector CT imaging of the chest was performed using the standard protocol during bolus administration of intravenous contrast. Multiplanar CT image  reconstructions and MIPs were obtained to evaluate the vascular anatomy. CONTRAST:  100 cc Isovue 370 IV. COMPARISON:  07/26/2015 chest radiograph.  No prior chest CT. FINDINGS: Mediastinum/Nodes: The study is high quality for the evaluation of pulmonary embolism. There is an acute saddle pulmonary embolus. There is a large burden of acute pulmonary embolism involving lobar, segmental and subsegmental branches of all lung lobes in both lungs. Minimally atherosclerotic nonaneurysmal thoracic aorta. Mildly dilated main pulmonary artery (3.1 cm diameter). Mild cardiomegaly with dilated right ventricle and right atrium (RV-LV ratio 1.7). No pericardial fluid/thickening. Normal visualized thyroid. Normal esophagus. No pathologically enlarged axillary, mediastinal or hilar lymph nodes. Lungs/Pleura: No pneumothorax. No pleural effusion. Wedge-shaped small subpleural focus of ground-glass opacity in the basilar medial right upper lobe (series 7/ image 44). Posterior right upper lobe 3 mm pulmonary nodule (series 7/ image 27). Irregular 1.6 x 0.7 cm focus of consolidation in the posterior left upper lobe (series 7/ image 35). Additional patchy foci of ground-glass opacity in peribronchovascular left upper lobe. Subsegmental atelectasis in both lower lobes. Upper abdomen: Unremarkable. Musculoskeletal: No aggressive appearing focal osseous lesions. Mild degenerative changes in the thoracic spine. Review of the MIP images confirms the above findings. IMPRESSION: 1. Large burden acute pulmonary embolism with  saddle pulmonary embolus. Pulmonary emboli involves lobar, segmental and subsegmental branches of all lung lobes in both lungs. 2. Dilated right atrium and right ventricle. Positive for acute PE with CT evidence of right heart strain (RV/LV Ratio = 1.7) consistent with at least submassive (intermediate risk) PE. The presence of right heart strain has been associated with an increased risk of morbidity and mortality. Please activate Code PE by paging [phone]. 3. Wedge-shaped small subpleural focus of ground-glass opacity in the basilar medial right upper lobe, probably a small pulmonary infarct. Additional mild patchy areas of ground-glass opacity in the left upper lobe probably represent areas of mild alveolar hemorrhage. 4. Additional irregular 1.6 cm focus of consolidation in the posterior left upper lobe, which is indeterminate. A follow-up chest CT is recommended in 3 months with attention to this focus. Critical Value/emergent results were called by telephone at the time of interpretation on 08/23/2015 at 5:25 pm to Dr. Leo Grosser , who verbally acknowledged these results. Electronically Signed   By: Ilona Sorrel M.D.   On: 08/23/2015 17:37   US Venous Img Lower Unilateral Right  08/23/2015  CLINICAL DATA:  Right lower extremity pain and swelling. Evaluate for DVT. EXAM: Right LOWER EXTREMITY VENOUS DOPPLER ULTRASOUND TECHNIQUE: Gray-scale sonography with graded compression, as well as color Doppler and duplex ultrasound were performed to evaluate the lower extremity deep venous systems from the level of the common femoral vein and including the common femoral, femoral, profunda femoral, popliteal and calf veins including the posterior tibial, peroneal and gastrocnemius veins when visible. The superficial great saphenous vein was also interrogated. Spectral Doppler was utilized to evaluate flow at rest and with distal augmentation maneuvers in the common femoral, femoral and popliteal veins.  COMPARISON:  None. FINDINGS: Expanded veins with internal echoes affecting the distal right femoral vein, popliteal vein, and posterior tibial/peroneal calf veins consistent with occlusive thrombus. No internal color Doppler flow. Above the clot deep veins are patent with symmetric respiratory phasicity. These results will be called to the ordering clinician or representative by the Radiology Department at the imaging location prior to patient disposition. IMPRESSION: Positive for occlusive deep venous thrombosis from the distal right femoral vein into the calf. Electronically Signed  By: Monte Fantasia M.D.   On: 08/23/2015 13:53   I have personally reviewed and evaluated these images and lab results as part of my medical decision-making.   EKG Interpretation   Date/Time:  Wednesday August 23 2015 15:34:08 EDT Ventricular Rate:  124 PR Interval:  142 QRS Duration: 81 QT Interval:  309 QTC Calculation: 444 R Axis:   23 Text Interpretation:  Sinus tachycardia Probable left atrial enlargement  Low voltage, precordial leads S1Q3T3 New since previous tracing suggesting  right heart strain Since last tracing rate faster Confirmed by Brigett Estell MD,  Quillian Quince [id number]) on 08/23/2015 3:56:33 PM      MDM   Final diagnoses:  Acute deep vein thrombosis (DVT) of popliteal vein of right lower extremity (HCC)  Acute saddle pulmonary embolism with acute cor pulmonale ([place])   44 old female presents with gradual onset generalized weakness over the last week with right lower chest pain and shortness of breath. She was found to have a right popliteal DVT extending into the distal femoral vein on outpatient imaging. On arrival she is significantly tachycardic and tachypneic, afebrile, has a confirmed right lower shoulder many DVT on my bedside study and evidence of right heart strain on bedside echo as well as EKG. I'm concerned for a submassive PE and heparinization was started prior to definitive imaging due to high  pretest probability and significant morbidity.  CT is confirmatory, I discussed with the hospitalist who will admit. D/w IR and pulmonology who will see the patient in consultation to consider lysis given significant saddle morphology and clot burden. Admit to [facility] for IR availability under hospitalist service.     Leo Grosser, MD 08/23/15 [phone]

## 2015-08-23 NOTE — Sedation Documentation (Signed)
Lines all attached per protocol, TPA started transported on monitor and O2 to 2M08

## 2015-08-23 NOTE — Progress Notes (Signed)
Echocardiogram 2D Echocardiogram has been performed.  Kristen Castillo 08/23/2015, 7:50 PM

## 2015-08-23 NOTE — ED Notes (Addendum)
Patient here with complaints of SOB. Reports that she was at Dr. Alain Marion office diagnosed with DVT to right leg. Recent surgery 3 weeks ago. Kristen Castillo

## 2015-08-23 NOTE — H&P (Signed)
History and Physical  Kristen Castillo [id number] DOB: [birth year] DOA: 08/23/2015  PCP: Walker Kehr, MD   Patient coming from: Home with husband.   Chief Complaint: Leg cramp and DVT on outpatient [name]  HPI: Kristen Castillo is a 75 y.o. female with medical history significant for recent ventral hernia repair here with Dr. Zella Richer who was just discharged home on 08/06/15. She says she has been having leg cramps behind her right knee that started a few days after she went home. Two days ago she started having some shortness of breath with exertion. No chest pain, no fevers, cough, dizziness or syncope. She says her recovery from surgery has been difficult and she has not been ambulating a lot, though she has been walking at home to take care of her ADLs. Her husband is present with her in the ER this PM.  ED Course: Patient was evaluated with bedside ECHO, concern for right heart strain was seen. Hospitalist was called for admission.  Started on Heparin drip and STAT CT PE protocol showed saddle embolus with heart strain. EDMD discussed with IR and PCCM.  Review of Systems: Please see HPI for pertinent positives and negatives. A complete 10 system review of systems are otherwise negative.  Past Medical History  Diagnosis Date  . Hematuria     in past  . Hair loss   . Unspecified vitamin D deficiency   . Migraines     in past  . Osteoporosis   . History of creation of ostomy     past partial colectomy in 05/03/14  . Complication of anesthesia   . PONV (postoperative nausea and vomiting)   . GERD (gastroesophageal reflux disease)   . Diverticulitis   . Kidney cysts   . Persistent cough   . History of skin cancer   . Deviated nasal septum   . Anxiety   . Cancer ([place])     hx skin cancer  . Ventral hernia   . Congestion of both ears   . Pneumonia     july 2016, pneumonia-06/2015   . Osteoarthritis     fingers   . Osteoporosis    Past Surgical History  Procedure Laterality  Date  . Abdominal hysterectomy  1972  . Oophorectomy  1976  . Tonsillectomy    . Lipoma resection      left scapula area  . Reduction mammaplasty  1994  . Rhinoplasty  1985  . Facial cosmetic surgery  2009    [name] MD in [place]  . Laparoscopic partial colectomy N/A 05/03/2014    Procedure: DIAGNOSTIC LAPAROSCOPY WITH DRAINAGE OF INTRA ABDOMINAL ABSCESS PARTIAL COLECTOMY ;  Surgeon: [name], MD;  Location: [facility];  Service: General;  Laterality: N/A;  supine position  . Laparotomy N/A 05/03/2014    Procedure: EXPLORATORY LAPAROTOMY WITH HARTMAN PROCEDURE;  Surgeon: [name], MD;  Location: [facility];  Service: General;  Laterality: N/A;  . Ileo loop colostomy closure N/A 11/08/2014    Procedure: LAPAROSCOPIC COLOSTOMY CLOSURE WITH PARTIAL COLECTOMY;  Surgeon: [name], MD;  Location: [facility];  Service: General;  Laterality: N/A;  . Laparoscopic lysis of adhesions N/A 11/08/2014    Procedure: LAPAROSCOPIC LYSIS OF ADHESIONS FOR 90 MINUTES;  Surgeon: [name], MD;  Location: [facility];  Service: General;  Laterality: N/A;  . Ventral hernia repair N/A 08/01/2015    Procedure: LAPAROSCOPIC VENTRAL INCISIONAL  HERNIA;  Surgeon: [name], MD;  Location: [facility];  Service: General;  Laterality:  N/A;  . Insertion of mesh N/A 08/01/2015    Procedure: INSERTION OF MESH;  Surgeon: [name], MD;  Location: [facility];  Service: General;  Laterality: N/A;  . Laparoscopic lysis of adhesions N/A 08/01/2015    Procedure: LAPAROSCOPIC LYSIS OF ADHESIONS;  Surgeon: [name], MD;  Location: [facility];  Service: General;  Laterality: N/A;    Social History:  reports that she has never smoked. She has never used smokeless tobacco. She reports that she does not drink alcohol or use illicit drugs.   Allergies  Allergen Reactions  . Codeine Nausea And Vomiting  . Dilaudid [Hydromorphone Hcl] Other (See Comments)    "too strong - I can't wake up"  . Sulfonamide Derivatives  Other (See Comments)    Childhood allergy; reaction unknown  . Tape Other (See Comments)    REDDNESS, BLISTER/  Use paper tape  . Norco [Hydrocodone-Acetaminophen] Rash    Family History  Problem Relation Age of Onset  . Arthritis Mother   . Colon cancer Mother 31  . Heart disease Father   . Dementia Father   . Diabetes Sister   . Cancer Maternal Grandfather     Lung cancer  . Stomach cancer Neg Hx      Prior to Admission medications   Medication Sig Start Date End Date Taking? Authorizing Provider  benzonatate (TESSALON) 200 MG capsule Take 1 capsule (200 mg total) by mouth 2 (two) times daily as needed for cough. 08/15/15  Yes Evie Lacks Plotnikov, MD  budesonide (RHINOCORT ALLERGY) 32 MCG/ACT nasal spray Place 1 spray into both nostrils daily.   Yes Historical Provider, MD  fluticasone (FLONASE) 50 MCG/ACT nasal spray Place into both nostrils daily.   Yes Historical Provider, MD  ibuprofen (ADVIL,MOTRIN) 200 MG tablet Take 200 mg by mouth 2 (two) times daily.   Yes Historical Provider, MD  pantoprazole (PROTONIX) 40 MG tablet Take 1 tablet (40 mg total) by mouth daily. 10/18/14  Yes Amy S Esterwood, PA-C  sennosides-docusate sodium (SENOKOT-S) 8.6-50 MG tablet Take 1 tablet by mouth daily.   Yes Historical Provider, MD  benzonatate (TESSALON) 200 MG capsule TAKE 1 CAPSULE TWICE DAILY AS NEEDED FOR COUGH. Patient not taking: Reported on 08/23/2015 08/15/15   Evie Lacks Plotnikov, MD  calcium carbonate (OS-CAL) 600 MG TABS tablet Take 600 mg by mouth 2 (two) times daily with a meal. Reported on 06/16/2015    Historical Provider, MD  cefdinir (OMNICEF) 300 MG capsule Take 1 capsule (300 mg total) by mouth 2 (two) times daily. Patient not taking: Reported on 08/23/2015 06/30/15   Cassandria Anger, MD  Cholecalciferol 1000 UNITS tablet Take 2,000 Units by mouth 2 (two) times daily. Reported on 07/03/2015    Historical Provider, MD  fluticasone furoate-vilanterol (BREO ELLIPTA) 100-25 MCG/INH AEPB  Inhale 1 puff into the lungs daily. Patient not taking: Reported on 08/23/2015 06/30/15   Cassandria Anger, MD  Multiple Vitamin (MULTIVITAMIN) tablet Take 1 tablet by mouth daily.    Historical Provider, MD  ondansetron (ZOFRAN ODT) 4 MG disintegrating tablet Take 1 tablet (4 mg total) by mouth every 6 (six) hours as needed for nausea or vomiting. 11/15/14   [name], MD  OVER THE COUNTER MEDICATION guiafenesin - sudafed er 600mg /60 mg as needed    Historical Provider, MD    Physical Exam: BP 149/90 mmHg  Pulse 115  Temp(Src) 97.7 F (36.5 C) (Oral)  Resp 28  Ht 5' 2.25" (1.581 m)  Wt 53.978 kg (  119 lb)  BMI 21.59 kg/m2  SpO2 95%  General:  Alert, oriented, calm, in no acute distress  Eyes: pupils round and reactive to light and accomodation, clear sclerea Neck: supple, no masses, trachea mildline  Cardiovascular: Tachycardic, RR, no murmurs or rubs, no peripheral edema  Respiratory: clear to auscultation bilaterally, no wheezes, no crackles  Abdomen: soft, nontender, nondistended, normal bowel tones heard  Skin: dry, no rashes  Musculoskeletal: no joint effusions, normal range of motion. Venous distension in right leg. Psychiatric: appropriate affect, normal speech  Neurologic: extraocular muscles intact, clear speech, moving all extremities with intact sensorium            Labs on Admission:  Basic Metabolic Panel:  Recent Labs Lab 08/23/15 1545 08/23/15 1549  NA 138 140  K 3.4* 3.5  CL 106 108  CO2 20*  --   GLUCOSE 144* 141*  BUN 27* 27*  CREATININE 0.96 0.90  CALCIUM 9.4  --    Liver Function Tests: No results for input(s): AST, ALT, ALKPHOS, BILITOT, PROT, ALBUMIN in the last 168 hours. No results for input(s): LIPASE, AMYLASE in the last 168 hours. No results for input(s): AMMONIA in the last 168 hours. CBC:  Recent Labs Lab 08/23/15 1545 08/23/15 1549  WBC 20.4*  --   NEUTROABS 16.8*  --   HGB 12.9 14.3  HCT 39.4 42.0  MCV 87.4  --   PLT  392  --    Cardiac Enzymes: No results for input(s): CKTOTAL, CKMB, CKMBINDEX, TROPONINI in the last 168 hours.  BNP (last 3 results) No results for input(s): BNP in the last 8760 hours.  ProBNP (last 3 results) No results for input(s): PROBNP in the last 8760 hours.  CBG: No results for input(s): GLUCAP in the last 168 hours.  Radiological Exams on Admission: Ct Angio Chest Pe W/cm &/or Wo Cm  08/23/2015  CLINICAL DATA:  Shortness of breath. Right lower extremity deep venous thrombosis. Recent surgery. EXAM: CT ANGIOGRAPHY CHEST WITH CONTRAST TECHNIQUE: Multidetector CT imaging of the chest was performed using the standard protocol during bolus administration of intravenous contrast. Multiplanar CT image reconstructions and MIPs were obtained to evaluate the vascular anatomy. CONTRAST:  100 cc Isovue 370 IV. COMPARISON:  07/26/2015 chest radiograph.  No prior chest CT. FINDINGS: Mediastinum/Nodes: The study is high quality for the evaluation of pulmonary embolism. There is an acute saddle pulmonary embolus. There is a large burden of acute pulmonary embolism involving lobar, segmental and subsegmental branches of all lung lobes in both lungs. Minimally atherosclerotic nonaneurysmal thoracic aorta. Mildly dilated main pulmonary artery (3.1 cm diameter). Mild cardiomegaly with dilated right ventricle and right atrium (RV-LV ratio 1.7). No pericardial fluid/thickening. Normal visualized thyroid. Normal esophagus. No pathologically enlarged axillary, mediastinal or hilar lymph nodes. Lungs/Pleura: No pneumothorax. No pleural effusion. Wedge-shaped small subpleural focus of ground-glass opacity in the basilar medial right upper lobe (series 7/ image 44). Posterior right upper lobe 3 mm pulmonary nodule (series 7/ image 27). Irregular 1.6 x 0.7 cm focus of consolidation in the posterior left upper lobe (series 7/ image 35). Additional patchy foci of ground-glass opacity in peribronchovascular left upper  lobe. Subsegmental atelectasis in both lower lobes. Upper abdomen: Unremarkable. Musculoskeletal: No aggressive appearing focal osseous lesions. Mild degenerative changes in the thoracic spine. Review of the MIP images confirms the above findings. IMPRESSION: 1. Large burden acute pulmonary embolism with saddle pulmonary embolus. Pulmonary emboli involves lobar, segmental and subsegmental branches of all lung lobes in both  lungs. 2. Dilated right atrium and right ventricle. Positive for acute PE with CT evidence of right heart strain (RV/LV Ratio = 1.7) consistent with at least submassive (intermediate risk) PE. The presence of right heart strain has been associated with an increased risk of morbidity and mortality. Please activate Code PE by paging [phone]. 3. Wedge-shaped small subpleural focus of ground-glass opacity in the basilar medial right upper lobe, probably a small pulmonary infarct. Additional mild patchy areas of ground-glass opacity in the left upper lobe probably represent areas of mild alveolar hemorrhage. 4. Additional irregular 1.6 cm focus of consolidation in the posterior left upper lobe, which is indeterminate. A follow-up chest CT is recommended in 3 months with attention to this focus. Critical Value/emergent results were called by telephone at the time of interpretation on 08/23/2015 at 5:25 pm to Dr. Leo Grosser , who verbally acknowledged these results. Electronically Signed   By: Ilona Sorrel M.D.   On: 08/23/2015 17:37   US Venous Img Lower Unilateral Right  08/23/2015  CLINICAL DATA:  Right lower extremity pain and swelling. Evaluate for DVT. EXAM: Right LOWER EXTREMITY VENOUS DOPPLER ULTRASOUND TECHNIQUE: Gray-scale sonography with graded compression, as well as color Doppler and duplex ultrasound were performed to evaluate the lower extremity deep venous systems from the level of the common femoral vein and including the common femoral, femoral, profunda femoral, popliteal and  calf veins including the posterior tibial, peroneal and gastrocnemius veins when visible. The superficial great saphenous vein was also interrogated. Spectral Doppler was utilized to evaluate flow at rest and with distal augmentation maneuvers in the common femoral, femoral and popliteal veins. COMPARISON:  None. FINDINGS: Expanded veins with internal echoes affecting the distal right femoral vein, popliteal vein, and posterior tibial/peroneal calf veins consistent with occlusive thrombus. No internal color Doppler flow. Above the clot deep veins are patent with symmetric respiratory phasicity. These results will be called to the ordering clinician or representative by the Radiology Department at the imaging location prior to patient disposition. IMPRESSION: Positive for occlusive deep venous thrombosis from the distal right femoral vein into the calf. Electronically Signed   By: Monte Fantasia M.D.   On: 08/23/2015 13:53   EKG: Independently reviewed. Sinus tachycardia with S1Q3T3 abnormality.   Assessment/Plan . Saddle Pulmonary embolism (HCC) with right heart strain - pt has already been started on Heparin Gtt in ER - ERMD spoke with PCCM and IR at my request - she will be transferred to [facility] for consideration of catheter directed lysis, in which case she will likely need to be transferred to the care of PCCM team - diagnosis, management and risks discussed with patient and husband, all questions answered  . Allergic rhinitis  . GERD (gastroesophageal reflux disease) - PO PPI  . DVT of leg (deep venous thrombosis) (HCC) - heparin gtt as above  DVT prophylaxis: On hep gtt   Code Status: FULL   Family Communication: Discussed with husband in ER this evening  Disposition Plan: Likely to home in 3-4 days.   Consults called: Pulmonary CC as well as IR were contacted by ER MD.   Admission status: Inpatient to Stepdown   Time spent: 65 minutes  Mir Marry Guan MD [facility] Pager [phone]  If 7PM-7AM, please contact night-coverage [email or web address] Password [email or web address]  08/23/2015, 5:50 PM

## 2015-08-23 NOTE — ED Notes (Signed)
[facility] here for transport of pt to [facility] 

## 2015-08-23 NOTE — Assessment & Plan Note (Signed)
08/23/15 acute RLE To [facility] ER STAT R/ PE

## 2015-08-23 NOTE — Progress Notes (Signed)
Pre visit review using our clinic review tool, if applicable. No additional management support is needed unless otherwise documented below in the visit note. 

## 2015-08-23 NOTE — ED Notes (Addendum)
Dr.Nester at bedside to evaluate pt prior to transfer to [facility].

## 2015-08-24 ENCOUNTER — Inpatient Hospital Stay (HOSPITAL_COMMUNITY): Payer: Medicare Other

## 2015-08-24 ENCOUNTER — Encounter (HOSPITAL_COMMUNITY): Payer: Self-pay | Admitting: *Deleted

## 2015-08-24 LAB — BASIC METABOLIC PANEL
ANION GAP: 10 (ref 5–15)
BUN: 16 mg/dL (ref 6–20)
CALCIUM: 7.8 mg/dL — AB (ref 8.9–10.3)
CO2: 18 mmol/L — ABNORMAL LOW (ref 22–32)
Chloride: 111 mmol/L (ref 101–111)
Creatinine, Ser: 0.84 mg/dL (ref 0.44–1.00)
Glucose, Bld: 149 mg/dL — ABNORMAL HIGH (ref 65–99)
POTASSIUM: 4.3 mmol/L (ref 3.5–5.1)
Sodium: 139 mmol/L (ref 135–145)

## 2015-08-24 LAB — TROPONIN I
TROPONIN I: 1.36 ng/mL — AB (ref <0.031)
TROPONIN I: 0.94 ng/mL — AB (ref <0.031)

## 2015-08-24 LAB — CBC
HCT: 32 % — ABNORMAL LOW (ref 36.0–46.0)
HEMATOCRIT: 32.2 % — AB (ref 36.0–46.0)
HEMATOCRIT: 32.9 % — AB (ref 36.0–46.0)
HEMATOCRIT: 30.8 % — AB (ref 36.0–46.0)
HEMOGLOBIN: 10.1 g/dL — AB (ref 12.0–15.0)
HEMOGLOBIN: 10 g/dL — AB (ref 12.0–15.0)
Hemoglobin: 9.9 g/dL — ABNORMAL LOW (ref 12.0–15.0)
Hemoglobin: 9.3 g/dL — ABNORMAL LOW (ref 12.0–15.0)
MCH: 27.3 pg (ref 26.0–34.0)
MCH: 27.1 pg (ref 26.0–34.0)
MCH: 26.9 pg (ref 26.0–34.0)
MCH: 26.8 pg (ref 26.0–34.0)
MCHC: 31.6 g/dL (ref 30.0–36.0)
MCHC: 30.7 g/dL (ref 30.0–36.0)
MCHC: 30.4 g/dL (ref 30.0–36.0)
MCHC: 30.2 g/dL (ref 30.0–36.0)
MCV: 86.5 fL (ref 78.0–100.0)
MCV: 88.2 fL (ref 78.0–100.0)
MCV: 88.4 fL (ref 78.0–100.0)
MCV: 88.8 fL (ref 78.0–100.0)
PLATELETS: 279 10*3/uL (ref 150–400)
PLATELETS: 260 10*3/uL (ref 150–400)
PLATELETS: 250 10*3/uL (ref 150–400)
Platelets: 241 10*3/uL (ref 150–400)
RBC: 3.7 MIL/uL — AB (ref 3.87–5.11)
RBC: 3.65 MIL/uL — AB (ref 3.87–5.11)
RBC: 3.72 MIL/uL — AB (ref 3.87–5.11)
RBC: 3.47 MIL/uL — AB (ref 3.87–5.11)
RDW: 13.8 % (ref 11.5–15.5)
RDW: 14 % (ref 11.5–15.5)
RDW: 14 % (ref 11.5–15.5)
RDW: 14.2 % (ref 11.5–15.5)
WBC: 12.7 10*3/uL — AB (ref 4.0–10.5)
WBC: 13.9 10*3/uL — AB (ref 4.0–10.5)
WBC: 20.1 10*3/uL — ABNORMAL HIGH (ref 4.0–10.5)
WBC: 13.5 10*3/uL — AB (ref 4.0–10.5)

## 2015-08-24 LAB — FIBRINOGEN
FIBRINOGEN: 506 mg/dL — AB (ref 204–475)
FIBRINOGEN: 523 mg/dL — AB (ref 204–475)
Fibrinogen: 438 mg/dL (ref 204–475)
Fibrinogen: 466 mg/dL (ref 204–475)

## 2015-08-24 LAB — ABO/RH: ABO/RH(D): A POS

## 2015-08-24 LAB — TYPE AND SCREEN
ABO/RH(D): A POS
Antibody Screen: NEGATIVE

## 2015-08-24 LAB — HEPARIN LEVEL (UNFRACTIONATED)
HEPARIN UNFRACTIONATED: 0.52 [IU]/mL (ref 0.30–0.70)
HEPARIN UNFRACTIONATED: 0.38 [IU]/mL (ref 0.30–0.70)
Heparin Unfractionated: 0.79 IU/mL — ABNORMAL HIGH (ref 0.30–0.70)

## 2015-08-24 LAB — MRSA PCR SCREENING: MRSA BY PCR: NEGATIVE

## 2015-08-24 MED ORDER — KETOROLAC TROMETHAMINE 15 MG/ML IJ SOLN
15.0000 mg | Freq: Once | INTRAMUSCULAR | Status: AC
  Administered 2015-08-24: 15 mg via INTRAVENOUS
  Filled 2015-08-24: qty 1

## 2015-08-24 MED ORDER — FENTANYL CITRATE (PF) 100 MCG/2ML IJ SOLN
25.0000 ug | INTRAMUSCULAR | Status: DC | PRN
  Administered 2015-08-24: 50 ug via INTRAVENOUS
  Filled 2015-08-24: qty 2

## 2015-08-24 MED ORDER — ALPRAZOLAM 0.25 MG PO TABS
0.2500 mg | ORAL_TABLET | Freq: Two times a day (BID) | ORAL | Status: DC | PRN
  Administered 2015-08-24 (×2): 0.25 mg via ORAL
  Filled 2015-08-24 (×2): qty 1

## 2015-08-24 MED ORDER — CETYLPYRIDINIUM CHLORIDE 0.05 % MT LIQD: 7.0000 mL | Freq: Two times a day (BID) | OROMUCOSAL | Status: DC

## 2015-08-24 MED ORDER — KETOROLAC TROMETHAMINE 15 MG/ML IJ SOLN
15.0000 mg | Freq: Three times a day (TID) | INTRAMUSCULAR | Status: DC | PRN
  Administered 2015-08-24 – 2015-08-25 (×2): 15 mg via INTRAVENOUS
  Filled 2015-08-24 (×3): qty 1

## 2015-08-24 NOTE — Progress Notes (Signed)
ANTICOAGULATION CONSULT NOTE - Follow-up Consult  Pharmacy Consult for heparin Indication: pulmonary embolus  Allergies  Allergen Reactions  . Codeine Nausea And Vomiting  . Dilaudid [Hydromorphone Hcl] Other (See Comments)    "too strong - I can't wake up"  . Sulfonamide Derivatives Other (See Comments)    Childhood allergy; reaction unknown  . Tape Other (See Comments)    REDDNESS, BLISTER/  Use paper tape  . Norco [Hydrocodone-Acetaminophen] Rash    Patient Measurements: Height: 5' 2.25" (158.1 cm) Weight: 116 lb 6.5 oz (52.8 kg) IBW/kg (Calculated) : 50.68 Heparin Dosing Weight: 56.7kg  Vital Signs: Temp: 98.1 F (36.7 C) (06/14 2357) Temp Source: Oral (06/14 2357) BP: 129/76 mmHg (06/15 0000) Pulse Rate: 105 (06/15 0000)  Labs:  Recent Labs  08/23/15 1545 08/23/15 1549 08/24/15 0129  HGB 12.9 14.3 10.1*  HCT 39.4 42.0 32.0*  PLT 392  --  279  APTT 27  --   --   LABPROT 15.5*  --   --   INR 1.27  --   --   HEPARINUNFRC  --   --  0.79*  CREATININE 0.96 0.90  --   TROPONINI 1.62*  --   --     Estimated Creatinine Clearance: 43.9 mL/min (by C-G formula based on Cr of 0.9).   Assessment: 75yo F on heparin for b/l PE and R leg DVT. CT showed large burden acute PE with saddle PE. PE involves lobar, segmental and subsegmental branches of all lung lobes in both lungs. Evidence of right heart strain (RV/LV Ratio = 1.7) consistent with at least submassive (intermediate risk) PE.  Receiving EKOS lysis (TPA 12mg  in 2 caths) - started ~2300 6/14 in IR.  Heparin level supratherapeutic on 900 units/hr. No issues with line or bleeding reported per RN.  Goal of Therapy:  Heparin level 0.3-0.7 units/ml Monitor platelets by anticoagulation protocol   Plan:  Decrease heparin to 800 units/hr Will f/u 6 hr heparin level post gtt change  Sherlon Handing, PharmD, BCPS Clinical pharmacist, pager [phone] 08/24/2015, 2:08 AM

## 2015-08-24 NOTE — Consult Note (Signed)
PULMONARY / CRITICAL CARE MEDICINE   Name: Kristen Castillo MRN: [id number] DOB: 1940-08-19    ADMISSION DATE:  08/23/2015 CONSULTATION DATE:  08/23/2015  REFERRING MD:  Dr. 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 Colostomy was reversed in 10/2014. She had several abdominal hernias due to surgeries which were repaired 5/23 2017, she was discharged 5/28. Since time of discharge she has been having R leg cramping and swelling. She also developed shortness of breath and productive cough 2 days prior to arrival. Also with R sided shoulder pain. She made these complaints to her PCP who scheduled her for RLE doppler, which demonstrated DVT. She was very SOB during the doppler and tech was concerned that she had PE and referred her back to PCP, who sent her to ED. In ED she was found to be tachycardic, dyspneic, and tachypneic. With known DVT she was sent for CTA chest, which demonstrated saddle PE with large clot burden. RV to LV ratio on CT was measured at 1.7. Bedside US in ED demonstrated significant R heart strain. EKG concerning for R heart strain as well with S1Q3 and ?T3. She was planned for admission to SDU under the hospitalist team and pulmonary was consulted.     SUBJECTIVE: NAD   VITAL SIGNS: BP 95/58 mmHg  Pulse 97  Temp(Src) 97.9 F (36.6 C) (Oral)  Resp 29  Ht 5' 2.25" (1.581 m)  Wt 116 lb 6.5 oz (52.8 kg)  BMI 21.12 kg/m2  SpO2 98%  HEMODYNAMICS:    VENTILATOR SETTINGS:    INTAKE / OUTPUT: I/O last 3 completed shifts: In: 1126.6 [I.V.:1026.6; IV Piggyback:100] Out: -   PHYSICAL EXAMINATION: General:  Female of normal body habitus in NAD. but complains of back pain Neuro:  Alert, oriented, non-focal HEENT:  [place]/AT, PERRL, no JVD Cardiovascular:  Tachy, regular, no JVD Lungs:  Tachypnea and increased WOB on 3 L [place]. Clear  bilateral breath sounds. Abdomen:  Soft, non-tender, non-distended. Multiple scars (some recent) representing laproscopy Musculoskeletal:  No acute deformity or ROM limitation Skin:  Grossly intact with exception of abdominal scars.   LABS:  BMET  Recent Labs Lab 08/23/15 1545 08/23/15 1549 08/24/15 0608  NA 138 140 139  K 3.4* 3.5 4.3  CL 106 108 111  CO2 20*  --  18*  BUN 27* 27* 16  CREATININE 0.96 0.90 0.84  GLUCOSE 144* 141* 149*    Electrolytes  Recent Labs Lab 08/23/15 1545 08/24/15 0608  CALCIUM 9.4 7.8*  MG 1.6*  --     CBC  Recent Labs Lab 08/23/15 1545 08/23/15 1549 08/24/15 0129 08/24/15 0608  WBC 20.4*  --  12.7* 13.9*  HGB 12.9 14.3 10.1* 9.9*  HCT 39.4 42.0 32.0* 32.2*  PLT 392  --  279 260    Coag's  Recent Labs Lab 08/23/15 1545  APTT 27  INR 1.27    Sepsis Markers  Recent Labs Lab 08/23/15 1855  LATICACIDVEN 1.3    ABG No results for input(s): PHART, PCO2ART, PO2ART in the last 168 hours.  Liver Enzymes No results for input(s): AST, ALT, ALKPHOS, BILITOT, ALBUMIN in the last 168 hours.  Cardiac Enzymes  Recent Labs Lab 08/23/15 1545 08/24/15 0129 08/24/15 0608  TROPONINI 1.62* 1.36* 0.94*    Glucose No results for input(s): GLUCAP in the last 168 hours.  Imaging Ct  Angio Chest Pe W/cm &/or Wo Cm  08/23/2015  CLINICAL DATA:  Shortness of breath. Right lower extremity deep venous thrombosis. Recent surgery. EXAM: CT ANGIOGRAPHY CHEST WITH CONTRAST TECHNIQUE: Multidetector CT imaging of the chest was performed using the standard protocol during bolus administration of intravenous contrast. Multiplanar CT image reconstructions and MIPs were obtained to evaluate the vascular anatomy. CONTRAST:  100 cc Isovue 370 IV. COMPARISON:  07/26/2015 chest radiograph.  No prior chest CT. FINDINGS: Mediastinum/Nodes: The study is high quality for the evaluation of pulmonary embolism. There is an acute saddle pulmonary embolus.  There is a large burden of acute pulmonary embolism involving lobar, segmental and subsegmental branches of all lung lobes in both lungs. Minimally atherosclerotic nonaneurysmal thoracic aorta. Mildly dilated main pulmonary artery (3.1 cm diameter). Mild cardiomegaly with dilated right ventricle and right atrium (RV-LV ratio 1.7). No pericardial fluid/thickening. Normal visualized thyroid. Normal esophagus. No pathologically enlarged axillary, mediastinal or hilar lymph nodes. Lungs/Pleura: No pneumothorax. No pleural effusion. Wedge-shaped small subpleural focus of ground-glass opacity in the basilar medial right upper lobe (series 7/ image 44). Posterior right upper lobe 3 mm pulmonary nodule (series 7/ image 27). Irregular 1.6 x 0.7 cm focus of consolidation in the posterior left upper lobe (series 7/ image 35). Additional patchy foci of ground-glass opacity in peribronchovascular left upper lobe. Subsegmental atelectasis in both lower lobes. Upper abdomen: Unremarkable. Musculoskeletal: No aggressive appearing focal osseous lesions. Mild degenerative changes in the thoracic spine. Review of the MIP images confirms the above findings. IMPRESSION: 1. Large burden acute pulmonary embolism with saddle pulmonary embolus. Pulmonary emboli involves lobar, segmental and subsegmental branches of all lung lobes in both lungs. 2. Dilated right atrium and right ventricle. Positive for acute PE with CT evidence of right heart strain (RV/LV Ratio = 1.7) consistent with at least submassive (intermediate risk) PE. The presence of right heart strain has been associated with an increased risk of morbidity and mortality. Please activate Code PE by paging [phone]. 3. Wedge-shaped small subpleural focus of ground-glass opacity in the basilar medial right upper lobe, probably a small pulmonary infarct. Additional mild patchy areas of ground-glass opacity in the left upper lobe probably represent areas of mild alveolar  hemorrhage. 4. Additional irregular 1.6 cm focus of consolidation in the posterior left upper lobe, which is indeterminate. A follow-up chest CT is recommended in 3 months with attention to this focus. Critical Value/emergent results were called by telephone at the time of interpretation on 08/23/2015 at 5:25 pm to Dr. Leo Grosser , who verbally acknowledged these results. Electronically Signed   By: Ilona Sorrel M.D.   On: 08/23/2015 17:37   Ir Angiogram Pulmonary Bilateral Selective  08/23/2015  INDICATION: History of sub massive pulmonary embolism and right-sided heart strain. Request made for initiation of bilateral ultrasound assisted pulmonary arterial catheter directed thrombolysis. EXAM: 1. ULTRASOUND GUIDANCE FOR VENOUS ACCESS X2 2. BILATERAL PULMONARY ARTERIOGRAPHY 3. FLUOROSCOPIC GUIDED PLACEMENT OF BILATERAL PULMONARY ARTERIAL LYTIC INFUSION CATHETERS COMPARISON:  Chest CTA - earlier same day; right lower extremity venous Doppler ultrasound - earlier same day MEDICATIONS: Moderate (conscious) sedation was employed during this procedure. A total of Versed 1.5 mg and Fentanyl 75 mcg was administered intravenously. Moderate Sedation Time: 30 minutes. The patient's level of consciousness and vital signs were monitored continuously by radiology nursing throughout the procedure under my direct supervision. CONTRAST:  20 cc Isovue-300 FLUOROSCOPY TIME:  5 minutes 36 seconds (15 mGy) COMPLICATIONS: None immediate. TECHNIQUE: Informed written consent was obtained from  the patient after a discussion of the risks, benefits and alternatives to treatment. Questions regarding the procedure were encouraged and answered. A timeout was performed prior to the initiation of the procedure. Ultrasound scanning was performed of the right groin demonstrated patency of the right common femoral vein. As such, decision was made to perform the bilateral pulmonary arterial catheter thrombolysis from right common femoral vein  approach. The right groin was draped in the usual sterile fashion, and a sterile drape was applied covering the operative field. Maximum barrier sterile technique with sterile gowns and gloves were used for the procedure. A timeout was performed prior to the initiation of the procedure. The right femoral head was marked fluoroscopically. The right common femoral vein was accessed with adjacent micro puncture kit under direct ultrasound guidance. Ultrasound images were saved for procedural documentation purposes. This allowed for placement of adjacent 6 French vascular sheaths. With the use of a stiff glidewire, a vertebral catheter was advanced into the right main pulmonary artery and a limited right pulmonary arteriogram was performed with a hand injection. Pressure measurements were then obtained from the main pulmonary artery. Over a Rosen wire, the vertebral catheter was exchanged for a 105/18 cm multi side-hole EKOS ultrasound assisted infusion catheter. With the use of the stiff glidewire, the vertebral catheter was advanced into the left main pulmonary artery. Over an a Rosen wire, the vertebral catheter was exchanged for a 105/12 cm multi side-hole EKOS ultrasound assisted infusion catheter. A postprocedural fluoroscopic image was obtained of the check demonstrating final catheter positioning. Both vascular sheath were secured at the right neck with interrupted 2-0 Prolene sutures. The external catheter tubing was secured at the right chest and the lytic therapy was initiated. The patient tolerated the procedure well without immediate postprocedural complication. FINDINGS: Acquired pressure measurements: Main pulmonary artery - 55/19; mean - 34 (normal: < 25/10) Following the procedure, both ultrasound assisted infusion catheter tips terminate within the distal aspects of the bilateral lower lobe sub segmental pulmonary arteries. IMPRESSION: 1. Successful fluoroscopic guided initiation of bilateral  ultrasound assisted catheter directed pulmonary arterial lysis for sub massive pulmonary embolism and right-sided heart strain. 2. Markedly elevated pressure measurements within the main pulmonary artery compatible with presumed acute pulmonary arterial hypertension. PLAN: - The patient will return to the interventional radiology suite in approximately 12 hours following the initiation of the catheter directed pulmonary arterial lysis for repeat pressure measurements. - Given presence of occlusive DVT within the right femoral and popliteal veins on right lower extremity venous Doppler ultrasound perform earlier today, the appropriateness of IVC filter placement for temporary caval interruption will be discussed with the providing critical care service prior to vascular sheath removal. Electronically Signed   By: Sandi Mariscal M.D.   On: 08/23/2015 23:28   Ir Angiogram Selective Each Additional Vessel  08/23/2015  INDICATION: History of sub massive pulmonary embolism and right-sided heart strain. Request made for initiation of bilateral ultrasound assisted pulmonary arterial catheter directed thrombolysis. EXAM: 1. ULTRASOUND GUIDANCE FOR VENOUS ACCESS X2 2. BILATERAL PULMONARY ARTERIOGRAPHY 3. FLUOROSCOPIC GUIDED PLACEMENT OF BILATERAL PULMONARY ARTERIAL LYTIC INFUSION CATHETERS COMPARISON:  Chest CTA - earlier same day; right lower extremity venous Doppler ultrasound - earlier same day MEDICATIONS: Moderate (conscious) sedation was employed during this procedure. A total of Versed 1.5 mg and Fentanyl 75 mcg was administered intravenously. Moderate Sedation Time: 30 minutes. The patient's level of consciousness and vital signs were monitored continuously by radiology nursing throughout the procedure under my direct supervision.  CONTRAST:  20 cc Isovue-300 FLUOROSCOPY TIME:  5 minutes 36 seconds (15 mGy) COMPLICATIONS: None immediate. TECHNIQUE: Informed written consent was obtained from the patient after a  discussion of the risks, benefits and alternatives to treatment. Questions regarding the procedure were encouraged and answered. A timeout was performed prior to the initiation of the procedure. Ultrasound scanning was performed of the right groin demonstrated patency of the right common femoral vein. As such, decision was made to perform the bilateral pulmonary arterial catheter thrombolysis from right common femoral vein approach. The right groin was draped in the usual sterile fashion, and a sterile drape was applied covering the operative field. Maximum barrier sterile technique with sterile gowns and gloves were used for the procedure. A timeout was performed prior to the initiation of the procedure. The right femoral head was marked fluoroscopically. The right common femoral vein was accessed with adjacent micro puncture kit under direct ultrasound guidance. Ultrasound images were saved for procedural documentation purposes. This allowed for placement of adjacent 6 French vascular sheaths. With the use of a stiff glidewire, a vertebral catheter was advanced into the right main pulmonary artery and a limited right pulmonary arteriogram was performed with a hand injection. Pressure measurements were then obtained from the main pulmonary artery. Over a Rosen wire, the vertebral catheter was exchanged for a 105/18 cm multi side-hole EKOS ultrasound assisted infusion catheter. With the use of the stiff glidewire, the vertebral catheter was advanced into the left main pulmonary artery. Over an a Rosen wire, the vertebral catheter was exchanged for a 105/12 cm multi side-hole EKOS ultrasound assisted infusion catheter. A postprocedural fluoroscopic image was obtained of the check demonstrating final catheter positioning. Both vascular sheath were secured at the right neck with interrupted 2-0 Prolene sutures. The external catheter tubing was secured at the right chest and the lytic therapy was initiated. The patient  tolerated the procedure well without immediate postprocedural complication. FINDINGS: Acquired pressure measurements: Main pulmonary artery - 55/19; mean - 34 (normal: < 25/10) Following the procedure, both ultrasound assisted infusion catheter tips terminate within the distal aspects of the bilateral lower lobe sub segmental pulmonary arteries. IMPRESSION: 1. Successful fluoroscopic guided initiation of bilateral ultrasound assisted catheter directed pulmonary arterial lysis for sub massive pulmonary embolism and right-sided heart strain. 2. Markedly elevated pressure measurements within the main pulmonary artery compatible with presumed acute pulmonary arterial hypertension. PLAN: - The patient will return to the interventional radiology suite in approximately 12 hours following the initiation of the catheter directed pulmonary arterial lysis for repeat pressure measurements. - Given presence of occlusive DVT within the right femoral and popliteal veins on right lower extremity venous Doppler ultrasound perform earlier today, the appropriateness of IVC filter placement for temporary caval interruption will be discussed with the providing critical care service prior to vascular sheath removal. Electronically Signed   By: Sandi Mariscal M.D.   On: 08/23/2015 23:28   Ir Angiogram Selective Each Additional Vessel  08/23/2015  INDICATION: History of sub massive pulmonary embolism and right-sided heart strain. Request made for initiation of bilateral ultrasound assisted pulmonary arterial catheter directed thrombolysis. EXAM: 1. ULTRASOUND GUIDANCE FOR VENOUS ACCESS X2 2. BILATERAL PULMONARY ARTERIOGRAPHY 3. FLUOROSCOPIC GUIDED PLACEMENT OF BILATERAL PULMONARY ARTERIAL LYTIC INFUSION CATHETERS COMPARISON:  Chest CTA - earlier same day; right lower extremity venous Doppler ultrasound - earlier same day MEDICATIONS: Moderate (conscious) sedation was employed during this procedure. A total of Versed 1.5 mg and Fentanyl 75  mcg was administered intravenously. Moderate  Sedation Time: 30 minutes. The patient's level of consciousness and vital signs were monitored continuously by radiology nursing throughout the procedure under my direct supervision. CONTRAST:  20 cc Isovue-300 FLUOROSCOPY TIME:  5 minutes 36 seconds (15 mGy) COMPLICATIONS: None immediate. TECHNIQUE: Informed written consent was obtained from the patient after a discussion of the risks, benefits and alternatives to treatment. Questions regarding the procedure were encouraged and answered. A timeout was performed prior to the initiation of the procedure. Ultrasound scanning was performed of the right groin demonstrated patency of the right common femoral vein. As such, decision was made to perform the bilateral pulmonary arterial catheter thrombolysis from right common femoral vein approach. The right groin was draped in the usual sterile fashion, and a sterile drape was applied covering the operative field. Maximum barrier sterile technique with sterile gowns and gloves were used for the procedure. A timeout was performed prior to the initiation of the procedure. The right femoral head was marked fluoroscopically. The right common femoral vein was accessed with adjacent micro puncture kit under direct ultrasound guidance. Ultrasound images were saved for procedural documentation purposes. This allowed for placement of adjacent 6 French vascular sheaths. With the use of a stiff glidewire, a vertebral catheter was advanced into the right main pulmonary artery and a limited right pulmonary arteriogram was performed with a hand injection. Pressure measurements were then obtained from the main pulmonary artery. Over a Rosen wire, the vertebral catheter was exchanged for a 105/18 cm multi side-hole EKOS ultrasound assisted infusion catheter. With the use of the stiff glidewire, the vertebral catheter was advanced into the left main pulmonary artery. Over an a Rosen wire, the  vertebral catheter was exchanged for a 105/12 cm multi side-hole EKOS ultrasound assisted infusion catheter. A postprocedural fluoroscopic image was obtained of the check demonstrating final catheter positioning. Both vascular sheath were secured at the right neck with interrupted 2-0 Prolene sutures. The external catheter tubing was secured at the right chest and the lytic therapy was initiated. The patient tolerated the procedure well without immediate postprocedural complication. FINDINGS: Acquired pressure measurements: Main pulmonary artery - 55/19; mean - 34 (normal: < 25/10) Following the procedure, both ultrasound assisted infusion catheter tips terminate within the distal aspects of the bilateral lower lobe sub segmental pulmonary arteries. IMPRESSION: 1. Successful fluoroscopic guided initiation of bilateral ultrasound assisted catheter directed pulmonary arterial lysis for sub massive pulmonary embolism and right-sided heart strain. 2. Markedly elevated pressure measurements within the main pulmonary artery compatible with presumed acute pulmonary arterial hypertension. PLAN: - The patient will return to the interventional radiology suite in approximately 12 hours following the initiation of the catheter directed pulmonary arterial lysis for repeat pressure measurements. - Given presence of occlusive DVT within the right femoral and popliteal veins on right lower extremity venous Doppler ultrasound perform earlier today, the appropriateness of IVC filter placement for temporary caval interruption will be discussed with the providing critical care service prior to vascular sheath removal. Electronically Signed   By: Sandi Mariscal M.D.   On: 08/23/2015 23:28   US Venous Img Lower Unilateral Right  08/23/2015  CLINICAL DATA:  Right lower extremity pain and swelling. Evaluate for DVT. EXAM: Right LOWER EXTREMITY VENOUS DOPPLER ULTRASOUND TECHNIQUE: Gray-scale sonography with graded compression, as well as  color Doppler and duplex ultrasound were performed to evaluate the lower extremity deep venous systems from the level of the common femoral vein and including the common femoral, femoral, profunda femoral, popliteal and calf veins including  the posterior tibial, peroneal and gastrocnemius veins when visible. The superficial great saphenous vein was also interrogated. Spectral Doppler was utilized to evaluate flow at rest and with distal augmentation maneuvers in the common femoral, femoral and popliteal veins. COMPARISON:  None. FINDINGS: Expanded veins with internal echoes affecting the distal right femoral vein, popliteal vein, and posterior tibial/peroneal calf veins consistent with occlusive thrombus. No internal color Doppler flow. Above the clot deep veins are patent with symmetric respiratory phasicity. These results will be called to the ordering clinician or representative by the Radiology Department at the imaging location prior to patient disposition. IMPRESSION: Positive for occlusive deep venous thrombosis from the distal right femoral vein into the calf. Electronically Signed   By: Monte Fantasia M.D.   On: 08/23/2015 13:53   Ir US Guide Vasc Access Right  08/23/2015  INDICATION: History of sub massive pulmonary embolism and right-sided heart strain. Request made for initiation of bilateral ultrasound assisted pulmonary arterial catheter directed thrombolysis. EXAM: 1. ULTRASOUND GUIDANCE FOR VENOUS ACCESS X2 2. BILATERAL PULMONARY ARTERIOGRAPHY 3. FLUOROSCOPIC GUIDED PLACEMENT OF BILATERAL PULMONARY ARTERIAL LYTIC INFUSION CATHETERS COMPARISON:  Chest CTA - earlier same day; right lower extremity venous Doppler ultrasound - earlier same day MEDICATIONS: Moderate (conscious) sedation was employed during this procedure. A total of Versed 1.5 mg and Fentanyl 75 mcg was administered intravenously. Moderate Sedation Time: 30 minutes. The patient's level of consciousness and vital signs were monitored  continuously by radiology nursing throughout the procedure under my direct supervision. CONTRAST:  20 cc Isovue-300 FLUOROSCOPY TIME:  5 minutes 36 seconds (15 mGy) COMPLICATIONS: None immediate. TECHNIQUE: Informed written consent was obtained from the patient after a discussion of the risks, benefits and alternatives to treatment. Questions regarding the procedure were encouraged and answered. A timeout was performed prior to the initiation of the procedure. Ultrasound scanning was performed of the right groin demonstrated patency of the right common femoral vein. As such, decision was made to perform the bilateral pulmonary arterial catheter thrombolysis from right common femoral vein approach. The right groin was draped in the usual sterile fashion, and a sterile drape was applied covering the operative field. Maximum barrier sterile technique with sterile gowns and gloves were used for the procedure. A timeout was performed prior to the initiation of the procedure. The right femoral head was marked fluoroscopically. The right common femoral vein was accessed with adjacent micro puncture kit under direct ultrasound guidance. Ultrasound images were saved for procedural documentation purposes. This allowed for placement of adjacent 6 French vascular sheaths. With the use of a stiff glidewire, a vertebral catheter was advanced into the right main pulmonary artery and a limited right pulmonary arteriogram was performed with a hand injection. Pressure measurements were then obtained from the main pulmonary artery. Over a Rosen wire, the vertebral catheter was exchanged for a 105/18 cm multi side-hole EKOS ultrasound assisted infusion catheter. With the use of the stiff glidewire, the vertebral catheter was advanced into the left main pulmonary artery. Over an a Rosen wire, the vertebral catheter was exchanged for a 105/12 cm multi side-hole EKOS ultrasound assisted infusion catheter. A postprocedural fluoroscopic  image was obtained of the check demonstrating final catheter positioning. Both vascular sheath were secured at the right neck with interrupted 2-0 Prolene sutures. The external catheter tubing was secured at the right chest and the lytic therapy was initiated. The patient tolerated the procedure well without immediate postprocedural complication. FINDINGS: Acquired pressure measurements: Main pulmonary artery - 55/19; mean -  34 (normal: < 25/10) Following the procedure, both ultrasound assisted infusion catheter tips terminate within the distal aspects of the bilateral lower lobe sub segmental pulmonary arteries. IMPRESSION: 1. Successful fluoroscopic guided initiation of bilateral ultrasound assisted catheter directed pulmonary arterial lysis for sub massive pulmonary embolism and right-sided heart strain. 2. Markedly elevated pressure measurements within the main pulmonary artery compatible with presumed acute pulmonary arterial hypertension. PLAN: - The patient will return to the interventional radiology suite in approximately 12 hours following the initiation of the catheter directed pulmonary arterial lysis for repeat pressure measurements. - Given presence of occlusive DVT within the right femoral and popliteal veins on right lower extremity venous Doppler ultrasound perform earlier today, the appropriateness of IVC filter placement for temporary caval interruption will be discussed with the providing critical care service prior to vascular sheath removal. Electronically Signed   By: Sandi Mariscal M.D.   On: 08/23/2015 23:28   Ir Infusion Thrombol Arterial Initial (ms)  08/23/2015  INDICATION: History of sub massive pulmonary embolism and right-sided heart strain. Request made for initiation of bilateral ultrasound assisted pulmonary arterial catheter directed thrombolysis. EXAM: 1. ULTRASOUND GUIDANCE FOR VENOUS ACCESS X2 2. BILATERAL PULMONARY ARTERIOGRAPHY 3. FLUOROSCOPIC GUIDED PLACEMENT OF BILATERAL  PULMONARY ARTERIAL LYTIC INFUSION CATHETERS COMPARISON:  Chest CTA - earlier same day; right lower extremity venous Doppler ultrasound - earlier same day MEDICATIONS: Moderate (conscious) sedation was employed during this procedure. A total of Versed 1.5 mg and Fentanyl 75 mcg was administered intravenously. Moderate Sedation Time: 30 minutes. The patient's level of consciousness and vital signs were monitored continuously by radiology nursing throughout the procedure under my direct supervision. CONTRAST:  20 cc Isovue-300 FLUOROSCOPY TIME:  5 minutes 36 seconds (15 mGy) COMPLICATIONS: None immediate. TECHNIQUE: Informed written consent was obtained from the patient after a discussion of the risks, benefits and alternatives to treatment. Questions regarding the procedure were encouraged and answered. A timeout was performed prior to the initiation of the procedure. Ultrasound scanning was performed of the right groin demonstrated patency of the right common femoral vein. As such, decision was made to perform the bilateral pulmonary arterial catheter thrombolysis from right common femoral vein approach. The right groin was draped in the usual sterile fashion, and a sterile drape was applied covering the operative field. Maximum barrier sterile technique with sterile gowns and gloves were used for the procedure. A timeout was performed prior to the initiation of the procedure. The right femoral head was marked fluoroscopically. The right common femoral vein was accessed with adjacent micro puncture kit under direct ultrasound guidance. Ultrasound images were saved for procedural documentation purposes. This allowed for placement of adjacent 6 French vascular sheaths. With the use of a stiff glidewire, a vertebral catheter was advanced into the right main pulmonary artery and a limited right pulmonary arteriogram was performed with a hand injection. Pressure measurements were then obtained from the main pulmonary  artery. Over a Rosen wire, the vertebral catheter was exchanged for a 105/18 cm multi side-hole EKOS ultrasound assisted infusion catheter. With the use of the stiff glidewire, the vertebral catheter was advanced into the left main pulmonary artery. Over an a Rosen wire, the vertebral catheter was exchanged for a 105/12 cm multi side-hole EKOS ultrasound assisted infusion catheter. A postprocedural fluoroscopic image was obtained of the check demonstrating final catheter positioning. Both vascular sheath were secured at the right neck with interrupted 2-0 Prolene sutures. The external catheter tubing was secured at the right chest and the lytic  therapy was initiated. The patient tolerated the procedure well without immediate postprocedural complication. FINDINGS: Acquired pressure measurements: Main pulmonary artery - 55/19; mean - 34 (normal: < 25/10) Following the procedure, both ultrasound assisted infusion catheter tips terminate within the distal aspects of the bilateral lower lobe sub segmental pulmonary arteries. IMPRESSION: 1. Successful fluoroscopic guided initiation of bilateral ultrasound assisted catheter directed pulmonary arterial lysis for sub massive pulmonary embolism and right-sided heart strain. 2. Markedly elevated pressure measurements within the main pulmonary artery compatible with presumed acute pulmonary arterial hypertension. PLAN: - The patient will return to the interventional radiology suite in approximately 12 hours following the initiation of the catheter directed pulmonary arterial lysis for repeat pressure measurements. - Given presence of occlusive DVT within the right femoral and popliteal veins on right lower extremity venous Doppler ultrasound perform earlier today, the appropriateness of IVC filter placement for temporary caval interruption will be discussed with the providing critical care service prior to vascular sheath removal. Electronically Signed   By: Sandi Mariscal M.D.    On: 08/23/2015 23:28   Ir Infusion Thrombol Arterial Initial (ms)  08/23/2015  INDICATION: History of sub massive pulmonary embolism and right-sided heart strain. Request made for initiation of bilateral ultrasound assisted pulmonary arterial catheter directed thrombolysis. EXAM: 1. ULTRASOUND GUIDANCE FOR VENOUS ACCESS X2 2. BILATERAL PULMONARY ARTERIOGRAPHY 3. FLUOROSCOPIC GUIDED PLACEMENT OF BILATERAL PULMONARY ARTERIAL LYTIC INFUSION CATHETERS COMPARISON:  Chest CTA - earlier same day; right lower extremity venous Doppler ultrasound - earlier same day MEDICATIONS: Moderate (conscious) sedation was employed during this procedure. A total of Versed 1.5 mg and Fentanyl 75 mcg was administered intravenously. Moderate Sedation Time: 30 minutes. The patient's level of consciousness and vital signs were monitored continuously by radiology nursing throughout the procedure under my direct supervision. CONTRAST:  20 cc Isovue-300 FLUOROSCOPY TIME:  5 minutes 36 seconds (15 mGy) COMPLICATIONS: None immediate. TECHNIQUE: Informed written consent was obtained from the patient after a discussion of the risks, benefits and alternatives to treatment. Questions regarding the procedure were encouraged and answered. A timeout was performed prior to the initiation of the procedure. Ultrasound scanning was performed of the right groin demonstrated patency of the right common femoral vein. As such, decision was made to perform the bilateral pulmonary arterial catheter thrombolysis from right common femoral vein approach. The right groin was draped in the usual sterile fashion, and a sterile drape was applied covering the operative field. Maximum barrier sterile technique with sterile gowns and gloves were used for the procedure. A timeout was performed prior to the initiation of the procedure. The right femoral head was marked fluoroscopically. The right common femoral vein was accessed with adjacent micro puncture kit under  direct ultrasound guidance. Ultrasound images were saved for procedural documentation purposes. This allowed for placement of adjacent 6 French vascular sheaths. With the use of a stiff glidewire, a vertebral catheter was advanced into the right main pulmonary artery and a limited right pulmonary arteriogram was performed with a hand injection. Pressure measurements were then obtained from the main pulmonary artery. Over a Rosen wire, the vertebral catheter was exchanged for a 105/18 cm multi side-hole EKOS ultrasound assisted infusion catheter. With the use of the stiff glidewire, the vertebral catheter was advanced into the left main pulmonary artery. Over an a Rosen wire, the vertebral catheter was exchanged for a 105/12 cm multi side-hole EKOS ultrasound assisted infusion catheter. A postprocedural fluoroscopic image was obtained of the check demonstrating final catheter positioning. Both vascular sheath  were secured at the right neck with interrupted 2-0 Prolene sutures. The external catheter tubing was secured at the right chest and the lytic therapy was initiated. The patient tolerated the procedure well without immediate postprocedural complication. FINDINGS: Acquired pressure measurements: Main pulmonary artery - 55/19; mean - 34 (normal: < 25/10) Following the procedure, both ultrasound assisted infusion catheter tips terminate within the distal aspects of the bilateral lower lobe sub segmental pulmonary arteries. IMPRESSION: 1. Successful fluoroscopic guided initiation of bilateral ultrasound assisted catheter directed pulmonary arterial lysis for sub massive pulmonary embolism and right-sided heart strain. 2. Markedly elevated pressure measurements within the main pulmonary artery compatible with presumed acute pulmonary arterial hypertension. PLAN: - The patient will return to the interventional radiology suite in approximately 12 hours following the initiation of the catheter directed pulmonary  arterial lysis for repeat pressure measurements. - Given presence of occlusive DVT within the right femoral and popliteal veins on right lower extremity venous Doppler ultrasound perform earlier today, the appropriateness of IVC filter placement for temporary caval interruption will be discussed with the providing critical care service prior to vascular sheath removal. Electronically Signed   By: Sandi Mariscal M.D.   On: 08/23/2015 23:28    STUDIES:  CTA chest 6/14:  Large saddle pulmonary embolus with bilateral significant clot burden. RV/LV 1.7. Right lower lobe pulmonary infarct. 1.6 cm nodule left upper lobe. LE dopplers 6/14:  Occlusive DVT distal right femoral vein into the calf. TTE 6/14:  EF 55-60 percent with normal wall motion. No aortic stenosis or regurgitation. No mitral stenosis or regurgitation. RV dilated with moderately reviewed to severely reduced systolic function. Flattening of the intraventricular septum consistent with RV volume and pressure overload. Pulmonary artery systolic pressure 53 mmHg. LA normal in size. RA mildly dilated.  MICROBIOLOGY: MRSA PCR >>  ANTIBIOTICS: NONE  SIGNIFICANT EVENTS: 04/2014 diverticulitis with perf. 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 Concern for RV strain on CT and bedside echo in ED. Admitted to ICU for likely catheter directed thrombolysis.   ASSESSMENT / PLAN:  HEMATOLOGIC A:   Acute saddle pulmonary embolism RLE DVT R arm pain concern for further DVT  P:  EKOS per IR, ending will transition to heparin drip  STAT echo pending > cardiology has been contacted and planning to read.   PULMONARY A: Acute hypoxemic respiratory failure secondary to PE H/o recurrent PNA thought secondary to GERD Pleuritic chest  pain  P:   Supplemental O2 to keep SpO2 > 92% Tessalon Perles prn cough See Heme toradol prn ordered 6/15 > follow renal fxn   CARDIOVASCULAR A:  Tachycardia  P:  Telemetry monitoring in ICU Vitals per unit protocol  RENAL A:   Hypokalemia - Replacing IV.  P:   Follow electrolytes & renal function Trending UOP Correct electrolytes as indicated Magnesium add-on to previous collection KCl 10 mEq IV x3 runs  GASTROINTESTINAL A:   Recent surgical repair of hernias GERD with esophagitis H/o diverticulitis with perforation s/p colostomy and revision 2016  P:   Protonix EC for SUP Start diet  INFECTIOUS A:   Leukocytosis - Likely reactive/demargination.  P:   Monitor off ABX Trend WBC and fever curve  ENDOCRINE A:   No acute issues  P:   Follow glucose  on daily chemistry  NEUROLOGIC A:   Pain  P:   Monitor Repeat Toradol x 1   FAMILY  - Updates: Patient and husband and updated 6/15. EKOS is ending.   - Inter-disciplinary family meet or Palliative Care meeting due by:  6/21   Richardson Landry Minor ACNP [facility] PCCM Pager [phone] till 3 pm If no answer page [phone] 08/24/2015, 12:12 PM   Attending Note:  I have examined patient, reviewed labs, studies and notes. I have discussed the case with S Minor, and I agree with the data and plans as amended above. 75 yo woman undergoing directed lytics for large saddle PE with R heart compromise. she will complete EKOS today. On my eval she is comfortable, has improved chest pain since she received toradol. We will plan to complete EKOS and then start heparin gtt. Likely transition to [place] or coumadin 6/16. Independent critical care time is 33 minutes.   Baltazar Apo, MD, PhD 08/24/2015, 1:47 PM [facility] [phone] or if no answer [phone]

## 2015-08-24 NOTE — Procedures (Signed)
Post 12 hrs PE lysis check Cahteters stable Post PE pressure - 55/19 (32) No comp/EBL

## 2015-08-24 NOTE — Progress Notes (Signed)
ANTICOAGULATION CONSULT NOTE - Follow Up Consult  Pharmacy Consult for heparin Indication: pulmonary embolus and DVT  Allergies  Allergen Reactions  . Codeine Nausea And Vomiting  . Dilaudid [Hydromorphone Hcl] Other (See Comments)    "too strong - I can't wake up"  . Sulfonamide Derivatives Other (See Comments)    Childhood allergy; reaction unknown  . Tape Other (See Comments)    REDDNESS, BLISTER/  Use paper tape  . Norco [Hydrocodone-Acetaminophen] Rash    Patient Measurements: Height: 5' 2.25" (158.1 cm) Weight: 116 lb 6.5 oz (52.8 kg) IBW/kg (Calculated) : 50.68 Heparin Dosing Weight: 52.8 kg  Vital Signs: Temp: 98.4 F (36.9 C) (06/15 1527) Temp Source: Oral (06/15 1527) BP: 121/71 mmHg (06/15 1700) Pulse Rate: 92 (06/15 1700)  Labs:  Recent Labs  08/23/15 1545 08/23/15 1549 08/24/15 0129 08/24/15 0608 08/24/15 0912 08/24/15 1155 08/24/15 1700  HGB 12.9 14.3 10.1* 9.9*  --  10.0*  --   HCT 39.4 42.0 32.0* 32.2*  --  32.9*  --   PLT 392  --  279 260  --  241  --   APTT 27  --   --   --   --   --   --   LABPROT 15.5*  --   --   --   --   --   --   INR 1.27  --   --   --   --   --   --   HEPARINUNFRC  --   --  0.79*  --  0.52  --  0.38  CREATININE 0.96 0.90  --  0.84  --   --   --   TROPONINI 1.62*  --  1.36* 0.94*  --   --   --     Estimated Creatinine Clearance: 47 mL/min (by C-G formula based on Cr of 0.84).   Medications:  Scheduled:  . pantoprazole  40 mg Oral Daily  . senna-docusate  1 tablet Oral Daily  . sodium chloride flush  3 mL Intravenous Q12H    Assessment: 75 yo woman admitted 08/23/2015 for VTE. Recent hernia repair discharged 5/28, presenting with leg gramping and SOB found to have submassive PE with RH strain. Pharmacy consulted to dose heparin.  The patient is s/p EKOS procedure with tPA stopped around 1100 this AM, heparin continued. A heparin level this evening remains therapeutic (HL 0.38 << 0.52, goal of 0.38 << 0.52, goal of  0.3-0.7)  Goal of Therapy:  Heparin level 0.3-0.7 units/ml Monitor platelets by anticoagulation protocol: Yes   Plan:  1. Continue heparin at 800 units/hr (8 ml/hr) 2. Will continue to monitor for any signs/symptoms of bleeding and will follow up with heparin level in the a.m.   Alycia Rossetti, PharmD, BCPS Clinical Pharmacist Pager: [phone] 08/24/2015 5:57 PM

## 2015-08-24 NOTE — Care Management Note (Signed)
Case Management Note  Patient Details  Name: Kristen Castillo MRN: [id number] Date of Birth: [birth year]  Subjective/Objective:    Pt admitted with PE                Action/Plan:  Pt is from home with husband.  CM will continue to monitor for discharge needs   Expected Discharge Date:   (unknown)               Expected Discharge Plan:  Home/Self Care  In-House Referral:     Discharge planning Services  CM Consult  Post Acute Care Choice:    Choice offered to:     DME Arranged:    DME Agency:     HH Arranged:    HH Agency:     Status of Service:  In process, will continue to follow  Medicare Important Message Given:    Date Medicare IM Given:    Medicare IM give by:    Date Additional Medicare IM Given:    Additional Medicare Important Message give by:     If discussed at [place] of Stay Meetings, dates discussed:    Additional Comments:  Maryclare Labrador, RN 08/24/2015, 8:47 AM

## 2015-08-24 NOTE — Progress Notes (Signed)
ANTICOAGULATION CONSULT NOTE - Follow Up Consult  Pharmacy Consult for heparin Indication: pulmonary embolus and DVT  Allergies  Allergen Reactions  . Codeine Nausea And Vomiting  . Dilaudid [Hydromorphone Hcl] Other (See Comments)    "too strong - I can't wake up"  . Sulfonamide Derivatives Other (See Comments)    Childhood allergy; reaction unknown  . Tape Other (See Comments)    REDDNESS, BLISTER/  Use paper tape  . Norco [Hydrocodone-Acetaminophen] Rash    Patient Measurements: Height: 5' 2.25" (158.1 cm) Weight: 116 lb 6.5 oz (52.8 kg) IBW/kg (Calculated) : 50.68 Heparin Dosing Weight: 52.8 kg  Vital Signs: Temp: 97.9 F (36.6 C) (06/15 1141) Temp Source: Oral (06/15 1141) BP: 95/58 mmHg (06/15 1100) Pulse Rate: 97 (06/15 1200)  Labs:  Recent Labs  08/23/15 1545 08/23/15 1549 08/24/15 0129 08/24/15 0608 08/24/15 0912 08/24/15 1155  HGB 12.9 14.3 10.1* 9.9*  --  10.0*  HCT 39.4 42.0 32.0* 32.2*  --  32.9*  PLT 392  --  279 260  --  241  APTT 27  --   --   --   --   --   LABPROT 15.5*  --   --   --   --   --   INR 1.27  --   --   --   --   --   HEPARINUNFRC  --   --  0.79*  --  0.52  --   CREATININE 0.96 0.90  --  0.84  --   --   TROPONINI 1.62*  --  1.36* 0.94*  --   --     Estimated Creatinine Clearance: 47 mL/min (by C-G formula based on Cr of 0.84).   Medications:  Scheduled:  . pantoprazole  40 mg Oral Daily  . senna-docusate  1 tablet Oral Daily  . sodium chloride flush  3 mL Intravenous Q12H    Assessment: 75 yo woman admitted 08/23/2015 for VTE. Recent hernia repair discharged 5/28, presenting with leg gramping and SOB found to have submassive PE with RH strain. Pharmacy consulted to dose heparin.  PMH: GERD, h/o skin ca, anxiety, osteoarthritis, osteoporosis  HL 0.52 therapeutic on heparin 800 units/h. Hgb 10, plt 241, no noted bleeding. Also receiving EKOS tPA started 6/14 @ 2300, fibrinogen 466. tPA stopped around 1100 this AM, to  continue heparin.  Goal of Therapy:  Heparin level 0.3-0.7 units/ml Monitor platelets by anticoagulation protocol: Yes   Plan:  Continue heparin 800 units/h  F/u HL after IR Daily HL, CBC Monitor s/sx bleeding   [name], Pharm.D., BCPS PGY2 Cardiology Pharmacy Resident Pager: [phone] 08/24/2015,12:25 PM

## 2015-08-24 NOTE — Progress Notes (Signed)
[facility] Progress Note Patient Name: Kristen Castillo DOB: 1940/11/04 MRN: [id number]   Date of Service  08/24/2015  HPI/Events of Note    eICU Interventions  OK to get out of bed to chair     Intervention Category Minor Interventions: Routine modifications to care plan (e.g. PRN medications for pain, fever)  Simonne Maffucci 08/24/2015, 4:54 PM

## 2015-08-25 ENCOUNTER — Ambulatory Visit: Payer: Medicare Other | Admitting: Internal Medicine

## 2015-08-25 DIAGNOSIS — J309 Allergic rhinitis, unspecified: Secondary | ICD-10-CM

## 2015-08-25 DIAGNOSIS — K219 Gastro-esophageal reflux disease without esophagitis: Secondary | ICD-10-CM

## 2015-08-25 LAB — BASIC METABOLIC PANEL
ANION GAP: 5 (ref 5–15)
BUN: 28 mg/dL — ABNORMAL HIGH (ref 6–20)
CALCIUM: 8.2 mg/dL — AB (ref 8.9–10.3)
CO2: 19 mmol/L — ABNORMAL LOW (ref 22–32)
Chloride: 113 mmol/L — ABNORMAL HIGH (ref 101–111)
Creatinine, Ser: 1.08 mg/dL — ABNORMAL HIGH (ref 0.44–1.00)
GFR, EST AFRICAN AMERICAN: 57 mL/min — AB (ref >60)
GFR, EST NON AFRICAN AMERICAN: 49 mL/min — AB (ref >60)
Glucose, Bld: 140 mg/dL — ABNORMAL HIGH (ref 65–99)
POTASSIUM: 4.3 mmol/L (ref 3.5–5.1)
SODIUM: 137 mmol/L (ref 135–145)

## 2015-08-25 LAB — CBC
HCT: 31.1 % — ABNORMAL LOW (ref 36.0–46.0)
Hemoglobin: 9.4 g/dL — ABNORMAL LOW (ref 12.0–15.0)
MCH: 26.8 pg (ref 26.0–34.0)
MCHC: 30.2 g/dL (ref 30.0–36.0)
MCV: 88.6 fL (ref 78.0–100.0)
PLATELETS: 256 10*3/uL (ref 150–400)
RBC: 3.51 MIL/uL — AB (ref 3.87–5.11)
RDW: 14.2 % (ref 11.5–15.5)
WBC: 12.5 10*3/uL — AB (ref 4.0–10.5)

## 2015-08-25 LAB — PROTIME-INR
INR: 1.54 — AB (ref 0.00–1.49)
PROTHROMBIN TIME: 18.5 s — AB (ref 11.6–15.2)

## 2015-08-25 LAB — HEPARIN LEVEL (UNFRACTIONATED): Heparin Unfractionated: 0.53 IU/mL (ref 0.30–0.70)

## 2015-08-25 LAB — PHOSPHORUS: PHOSPHORUS: 2.1 mg/dL — AB (ref 2.5–4.6)

## 2015-08-25 LAB — MAGNESIUM: Magnesium: 1.5 mg/dL — ABNORMAL LOW (ref 1.7–2.4)

## 2015-08-25 LAB — APTT: aPTT: 113 seconds — ABNORMAL HIGH (ref 24–37)

## 2015-08-25 MED ORDER — APIXABAN 5 MG PO TABS
10.0000 mg | ORAL_TABLET | Freq: Two times a day (BID) | ORAL | Status: DC
  Administered 2015-08-25 – 2015-08-26 (×3): 10 mg via ORAL
  Filled 2015-08-25 (×4): qty 2

## 2015-08-25 MED ORDER — APIXABAN 5 MG PO TABS: 5.0000 mg | ORAL_TABLET | Freq: Two times a day (BID) | ORAL | Status: DC

## 2015-08-25 MED ORDER — KETOROLAC TROMETHAMINE 30 MG/ML IJ SOLN
30.0000 mg | Freq: Once | INTRAMUSCULAR | Status: AC
  Administered 2015-08-25: 30 mg via INTRAVENOUS
  Filled 2015-08-25: qty 1

## 2015-08-25 MED ORDER — MAGNESIUM SULFATE 4 GM/100ML IV SOLN
4.0000 g | Freq: Once | INTRAVENOUS | Status: AC
  Administered 2015-08-25: 4 g via INTRAVENOUS
  Filled 2015-08-25 (×2): qty 100

## 2015-08-25 MED ORDER — DEXTROSE 5 % IV SOLN
20.0000 mmol | Freq: Once | INTRAVENOUS | Status: AC
  Administered 2015-08-25: 20 mmol via INTRAVENOUS
  Filled 2015-08-25: qty 6.67

## 2015-08-25 MED ORDER — APIXABAN 5 MG PO TABS
10.0000 mg | ORAL_TABLET | Freq: Two times a day (BID) | ORAL | Status: DC
  Filled 2015-08-25: qty 2

## 2015-08-25 NOTE — Progress Notes (Signed)
[facility] Progress Note Patient Name: Kristen Castillo DOB: [birth year] MRN: [id number]   Date of Service  08/25/2015  HPI/Events of Note  Pt wants toradol.  eICU Interventions  She gets toradol.     Intervention Category Major Interventions: Other:  Victorine Mcnee 08/25/2015, 5:58 PM

## 2015-08-25 NOTE — Progress Notes (Signed)
  Patient Saturations on Room Air at Rest = 96%  Patient Saturations on Room Air while Ambulating = 94%   

## 2015-08-25 NOTE — Discharge Instructions (Signed)
Information on my medicine - ELIQUIS (apixaban)  This medication education was reviewed with me or my healthcare representative as part of my discharge preparation.  The pharmacist that spoke with me during my hospital stay was:  Wynelle Fanny, [facility]  Why was Eliquis prescribed for you? Eliquis was prescribed to treat blood clots that may have been found in the veins of your legs (deep vein thrombosis) or in your lungs (pulmonary embolism) and to reduce the risk of them occurring again.  What do You need to know about Eliquis ? The starting dose is 10 mg (two 5 mg tablets) taken TWICE daily for the FIRST SEVEN (7) DAYS, then on (enter date)  6/23  the dose is reduced to ONE 5 mg tablet taken TWICE daily.  Eliquis may be taken with or without food.   Try to take the dose about the same time in the morning and in the evening. If you have difficulty swallowing the tablet whole please discuss with your pharmacist how to take the medication safely.  Take Eliquis exactly as prescribed and DO NOT stop taking Eliquis without talking to the doctor who prescribed the medication.  Stopping may increase your risk of developing a new blood clot.  Refill your prescription before you run out.  After discharge, you should have regular check-up appointments with your healthcare provider that is prescribing your Eliquis.    What do you do if you miss a dose? If a dose of ELIQUIS is not taken at the scheduled time, take it as soon as possible on the same day and twice-daily administration should be resumed. The dose should not be doubled to make up for a missed dose.  Important Safety Information A possible side effect of Eliquis is bleeding. You should call your healthcare provider right away if you experience any of the following: ? Bleeding from an injury or your nose that does not stop. ? Unusual colored urine (red or dark brown) or unusual colored stools (red or black). ? Unusual bruising for  unknown reasons. ? A serious fall or if you hit your head (even if there is no bleeding).  Some medicines may interact with Eliquis and might increase your risk of bleeding or clotting while on Eliquis. To help avoid this, consult your healthcare provider or pharmacist prior to using any new prescription or non-prescription medications, including herbals, vitamins, non-steroidal anti-inflammatory drugs (NSAIDs) and supplements.  This website has more information on Eliquis (apixaban): http://[email or web address].com/eliquis/home

## 2015-08-25 NOTE — Progress Notes (Addendum)
ANTICOAGULATION CONSULT NOTE - Follow Up Consult  Pharmacy Consult for heparin Indication: pulmonary embolus and DVT  Allergies  Allergen Reactions  . Codeine Nausea And Vomiting  . Dilaudid [Hydromorphone Hcl] Other (See Comments)    "too strong - I can't wake up"  . Sulfonamide Derivatives Other (See Comments)    Childhood allergy; reaction unknown  . Tape Other (See Comments)    REDDNESS, BLISTER/  Use paper tape  . Norco [Hydrocodone-Acetaminophen] Rash    Patient Measurements: Height: 5' 2.25" (158.1 cm) Weight: 116 lb 6.5 oz (52.8 kg) IBW/kg (Calculated) : 50.68 Heparin Dosing Weight: 52.8 kg  Vital Signs: Temp: 97.3 F (36.3 C) (06/16 0759) Temp Source: Oral (06/16 0759) BP: 103/65 mmHg (06/16 0800) Pulse Rate: 84 (06/16 0800)  Labs:  Recent Labs  08/23/15 1545 08/23/15 1549  08/24/15 0129 08/24/15 0608 08/24/15 0912 08/24/15 1155 08/24/15 1700 08/24/15 2130 08/25/15 0500  HGB 12.9 14.3  --  10.1* 9.9*  --  10.0*  --  9.3* 9.4*  HCT 39.4 42.0  --  32.0* 32.2*  --  32.9*  --  30.8* 31.1*  PLT 392  --   --  279 260  --  241  --  250 256  APTT 27  --   --   --   --   --   --   --   --  113*  LABPROT 15.5*  --   --   --   --   --   --   --   --  18.5*  INR 1.27  --   --   --   --   --   --   --   --  1.54*  HEPARINUNFRC  --   --   < > 0.79*  --  0.52  --  0.38  --  0.53  CREATININE 0.96 0.90  --   --  0.84  --   --   --   --  1.08*  TROPONINI 1.62*  --   --  1.36* 0.94*  --   --   --   --   --   < > = values in this interval not displayed.  Estimated Creatinine Clearance: 36.6 mL/min (by C-G formula based on Cr of 1.08).   Medications:  Scheduled:  . pantoprazole  40 mg Oral Daily  . senna-docusate  1 tablet Oral Daily  . sodium chloride flush  3 mL Intravenous Q12H    Assessment: 75 yo woman admitted 08/23/2015 for VTE. Recent hernia repair discharged 5/28, [name], [name], [name], [name]   [name], Pharm.D., BCPS PGY2 Cardiology Pharmacy Resident Pager: [phone]  08/25/2015 9:31 AM

## 2015-08-25 NOTE — Progress Notes (Signed)
Referring Physician(s): Dr. Simonne Maffucci  Supervising Physician: Corrie Mckusick  Patient Status:  Inpatient  Chief Complaint:  Pulmonary embolism  Subjective:  Kristen Castillo is doing well. No complaints. No SOB.  She tells me she is about to be transferred to another room.  Allergies: Codeine; Dilaudid; Sulfonamide derivatives; Tape; and Norco  Medications: Prior to Admission medications   Medication Sig Start Date End Date Taking? Authorizing Provider  benzonatate (TESSALON) 200 MG capsule Take 1 capsule (200 mg total) by mouth 2 (two) times daily as needed for cough. 08/15/15  Yes Evie Lacks Plotnikov, MD  budesonide (RHINOCORT ALLERGY) 32 MCG/ACT nasal spray Place 1 spray into both nostrils daily.   Yes Historical Provider, MD  fluticasone (FLONASE) 50 MCG/ACT nasal spray Place into both nostrils daily.   Yes Historical Provider, MD  ibuprofen (ADVIL,MOTRIN) 200 MG tablet Take 200 mg by mouth 2 (two) times daily.   Yes Historical Provider, MD  pantoprazole (PROTONIX) 40 MG tablet Take 1 tablet (40 mg total) by mouth daily. 10/18/14  Yes Amy S Esterwood, PA-C  sennosides-docusate sodium (SENOKOT-S) 8.6-50 MG tablet Take 1 tablet by mouth daily.   Yes Historical Provider, MD  benzonatate (TESSALON) 200 MG capsule TAKE 1 CAPSULE TWICE DAILY AS NEEDED FOR COUGH. Patient not taking: Reported on 08/23/2015 08/15/15   Evie Lacks Plotnikov, MD  calcium carbonate (OS-CAL) 600 MG TABS tablet Take 600 mg by mouth 2 (two) times daily with a meal. Reported on 06/16/2015    Historical Provider, MD  cefdinir (OMNICEF) 300 MG capsule Take 1 capsule (300 mg total) by mouth 2 (two) times daily. Patient not taking: Reported on 08/23/2015 06/30/15   Cassandria Anger, MD  Cholecalciferol 1000 UNITS tablet Take 2,000 Units by mouth 2 (two) times daily. Reported on 07/03/2015    Historical Provider, MD  fluticasone furoate-vilanterol (BREO ELLIPTA) 100-25 MCG/INH AEPB Inhale 1 puff into the lungs  daily. Patient not taking: Reported on 08/23/2015 06/30/15   Cassandria Anger, MD  Multiple Vitamin (MULTIVITAMIN) tablet Take 1 tablet by mouth daily.    Historical Provider, MD  ondansetron (ZOFRAN ODT) 4 MG disintegrating tablet Take 1 tablet (4 mg total) by mouth every 6 (six) hours as needed for nausea or vomiting. 11/15/14   [name], MD  OVER THE COUNTER MEDICATION guiafenesin - sudafed er 643m/60 mg as needed    Historical Provider, MD     Vital Signs: BP 164/147 mmHg  Pulse 89  Temp(Src) 97.5 F (36.4 C) (Axillary)  Resp 22  Ht 5' 2.25" (1.581 m)  Wt 116 lb 6.5 oz (52.8 kg)  BMI 21.12 kg/m2  SpO2 98%  Physical Exam Awake and alert Pulm = normal effort Groin stick site OK, no hematoma  Imaging: Ct Angio Chest Pe W/cm &/or Wo Cm  08/23/2015  CLINICAL DATA:  Shortness of breath. Right lower extremity deep venous thrombosis. Recent surgery. EXAM: CT ANGIOGRAPHY CHEST WITH CONTRAST TECHNIQUE: Multidetector CT imaging of the chest was performed using the standard protocol during bolus administration of intravenous contrast. Multiplanar CT image reconstructions and MIPs were obtained to evaluate the vascular anatomy. CONTRAST:  100 cc Isovue 370 IV. COMPARISON:  07/26/2015 chest radiograph.  No prior chest CT. FINDINGS: Mediastinum/Nodes: The study is high quality for the evaluation of pulmonary embolism. There is an acute saddle pulmonary embolus. There is a large burden of acute pulmonary embolism involving lobar, segmental and subsegmental branches of all lung lobes in both lungs. Minimally atherosclerotic nonaneurysmal thoracic aorta.  Mildly dilated main pulmonary artery (3.1 cm diameter). Mild cardiomegaly with dilated right ventricle and right atrium (RV-LV ratio 1.7). No pericardial fluid/thickening. Normal visualized thyroid. Normal esophagus. No pathologically enlarged axillary, mediastinal or hilar lymph nodes. Lungs/Pleura: No pneumothorax. No pleural effusion.  Wedge-shaped small subpleural focus of ground-glass opacity in the basilar medial right upper lobe (series 7/ image 44). Posterior right upper lobe 3 mm pulmonary nodule (series 7/ image 27). Irregular 1.6 x 0.7 cm focus of consolidation in the posterior left upper lobe (series 7/ image 35). Additional patchy foci of ground-glass opacity in peribronchovascular left upper lobe. Subsegmental atelectasis in both lower lobes. Upper abdomen: Unremarkable. Musculoskeletal: No aggressive appearing focal osseous lesions. Mild degenerative changes in the thoracic spine. Review of the MIP images confirms the above findings. IMPRESSION: 1. Large burden acute pulmonary embolism with saddle pulmonary embolus. Pulmonary emboli involves lobar, segmental and subsegmental branches of all lung lobes in both lungs. 2. Dilated right atrium and right ventricle. Positive for acute PE with CT evidence of right heart strain (RV/LV Ratio = 1.7) consistent with at least submassive (intermediate risk) PE. The presence of right heart strain has been associated with an increased risk of morbidity and mortality. Please activate Code PE by paging [phone]. 3. Wedge-shaped small subpleural focus of ground-glass opacity in the basilar medial right upper lobe, probably a small pulmonary infarct. Additional mild patchy areas of ground-glass opacity in the left upper lobe probably represent areas of mild alveolar hemorrhage. 4. Additional irregular 1.6 cm focus of consolidation in the posterior left upper lobe, which is indeterminate. A follow-up chest CT is recommended in 3 months with attention to this focus. Critical Value/emergent results were called by telephone at the time of interpretation on 08/23/2015 at 5:25 pm to Dr. Leo Grosser , who verbally acknowledged these results. Electronically Signed   By: Ilona Sorrel M.D.   On: 08/23/2015 17:37   Ir Angiogram Pulmonary Bilateral Selective  08/23/2015  INDICATION: History of sub massive  pulmonary embolism and right-sided heart strain. Request made for initiation of bilateral ultrasound assisted pulmonary arterial catheter directed thrombolysis. EXAM: 1. ULTRASOUND GUIDANCE FOR VENOUS ACCESS X2 2. BILATERAL PULMONARY ARTERIOGRAPHY 3. FLUOROSCOPIC GUIDED PLACEMENT OF BILATERAL PULMONARY ARTERIAL LYTIC INFUSION CATHETERS COMPARISON:  Chest CTA - earlier same day; right lower extremity venous Doppler ultrasound - earlier same day MEDICATIONS: Moderate (conscious) sedation was employed during this procedure. A total of Versed 1.5 mg and Fentanyl 75 mcg was administered intravenously. Moderate Sedation Time: 30 minutes. The patient's level of consciousness and vital signs were monitored continuously by radiology nursing throughout the procedure under my direct supervision. CONTRAST:  20 cc Isovue-300 FLUOROSCOPY TIME:  5 minutes 36 seconds (15 mGy) COMPLICATIONS: None immediate. TECHNIQUE: Informed written consent was obtained from the patient after a discussion of the risks, benefits and alternatives to treatment. Questions regarding the procedure were encouraged and answered. A timeout was performed prior to the initiation of the procedure. Ultrasound scanning was performed of the right groin demonstrated patency of the right common femoral vein. As such, decision was made to perform the bilateral pulmonary arterial catheter thrombolysis from right common femoral vein approach. The right groin was draped in the usual sterile fashion, and a sterile drape was applied covering the operative field. Maximum barrier sterile technique with sterile gowns and gloves were used for the procedure. A timeout was performed prior to the initiation of the procedure. The right femoral head was marked fluoroscopically. The right common femoral vein was accessed  with adjacent micro puncture kit under direct ultrasound guidance. Ultrasound images were saved for procedural documentation purposes. This allowed for placement  of adjacent 6 French vascular sheaths. With the use of a stiff glidewire, a vertebral catheter was advanced into the right main pulmonary artery and a limited right pulmonary arteriogram was performed with a hand injection. Pressure measurements were then obtained from the main pulmonary artery. Over a Rosen wire, the vertebral catheter was exchanged for a 105/18 cm multi side-hole EKOS ultrasound assisted infusion catheter. With the use of the stiff glidewire, the vertebral catheter was advanced into the left main pulmonary artery. Over an a Rosen wire, the vertebral catheter was exchanged for a 105/12 cm multi side-hole EKOS ultrasound assisted infusion catheter. A postprocedural fluoroscopic image was obtained of the check demonstrating final catheter positioning. Both vascular sheath were secured at the right neck with interrupted 2-0 Prolene sutures. The external catheter tubing was secured at the right chest and the lytic therapy was initiated. The patient tolerated the procedure well without immediate postprocedural complication. FINDINGS: Acquired pressure measurements: Main pulmonary artery - 55/19; mean - 34 (normal: < 25/10) Following the procedure, both ultrasound assisted infusion catheter tips terminate within the distal aspects of the bilateral lower lobe sub segmental pulmonary arteries. IMPRESSION: 1. Successful fluoroscopic guided initiation of bilateral ultrasound assisted catheter directed pulmonary arterial lysis for sub massive pulmonary embolism and right-sided heart strain. 2. Markedly elevated pressure measurements within the main pulmonary artery compatible with presumed acute pulmonary arterial hypertension. PLAN: - The patient will return to the interventional radiology suite in approximately 12 hours following the initiation of the catheter directed pulmonary arterial lysis for repeat pressure measurements. - Given presence of occlusive DVT within the right femoral and popliteal veins on  right lower extremity venous Doppler ultrasound perform earlier today, the appropriateness of IVC filter placement for temporary caval interruption will be discussed with the providing critical care service prior to vascular sheath removal. Electronically Signed   By: Sandi Mariscal M.D.   On: 08/23/2015 23:28   Ir Angiogram Selective Each Additional Vessel  08/23/2015  INDICATION: History of sub massive pulmonary embolism and right-sided heart strain. Request made for initiation of bilateral ultrasound assisted pulmonary arterial catheter directed thrombolysis. EXAM: 1. ULTRASOUND GUIDANCE FOR VENOUS ACCESS X2 2. BILATERAL PULMONARY ARTERIOGRAPHY 3. FLUOROSCOPIC GUIDED PLACEMENT OF BILATERAL PULMONARY ARTERIAL LYTIC INFUSION CATHETERS COMPARISON:  Chest CTA - earlier same day; right lower extremity venous Doppler ultrasound - earlier same day MEDICATIONS: Moderate (conscious) sedation was employed during this procedure. A total of Versed 1.5 mg and Fentanyl 75 mcg was administered intravenously. Moderate Sedation Time: 30 minutes. The patient's level of consciousness and vital signs were monitored continuously by radiology nursing throughout the procedure under my direct supervision. CONTRAST:  20 cc Isovue-300 FLUOROSCOPY TIME:  5 minutes 36 seconds (15 mGy) COMPLICATIONS: None immediate. TECHNIQUE: Informed written consent was obtained from the patient after a discussion of the risks, benefits and alternatives to treatment. Questions regarding the procedure were encouraged and answered. A timeout was performed prior to the initiation of the procedure. Ultrasound scanning was performed of the right groin demonstrated patency of the right common femoral vein. As such, decision was made to perform the bilateral pulmonary arterial catheter thrombolysis from right common femoral vein approach. The right groin was draped in the usual sterile fashion, and a sterile drape was applied covering the operative field. Maximum  barrier sterile technique with sterile gowns and gloves were used for the procedure.  A timeout was performed prior to the initiation of the procedure. The right femoral head was marked fluoroscopically. The right common femoral vein was accessed with adjacent micro puncture kit under direct ultrasound guidance. Ultrasound images were saved for procedural documentation purposes. This allowed for placement of adjacent 6 French vascular sheaths. With the use of a stiff glidewire, a vertebral catheter was advanced into the right main pulmonary artery and a limited right pulmonary arteriogram was performed with a hand injection. Pressure measurements were then obtained from the main pulmonary artery. Over a Rosen wire, the vertebral catheter was exchanged for a 105/18 cm multi side-hole EKOS ultrasound assisted infusion catheter. With the use of the stiff glidewire, the vertebral catheter was advanced into the left main pulmonary artery. Over an a Rosen wire, the vertebral catheter was exchanged for a 105/12 cm multi side-hole EKOS ultrasound assisted infusion catheter. A postprocedural fluoroscopic image was obtained of the check demonstrating final catheter positioning. Both vascular sheath were secured at the right neck with interrupted 2-0 Prolene sutures. The external catheter tubing was secured at the right chest and the lytic therapy was initiated. The patient tolerated the procedure well without immediate postprocedural complication. FINDINGS: Acquired pressure measurements: Main pulmonary artery - 55/19; mean - 34 (normal: < 25/10) Following the procedure, both ultrasound assisted infusion catheter tips terminate within the distal aspects of the bilateral lower lobe sub segmental pulmonary arteries. IMPRESSION: 1. Successful fluoroscopic guided initiation of bilateral ultrasound assisted catheter directed pulmonary arterial lysis for sub massive pulmonary embolism and right-sided heart strain. 2. Markedly  elevated pressure measurements within the main pulmonary artery compatible with presumed acute pulmonary arterial hypertension. PLAN: - The patient will return to the interventional radiology suite in approximately 12 hours following the initiation of the catheter directed pulmonary arterial lysis for repeat pressure measurements. - Given presence of occlusive DVT within the right femoral and popliteal veins on right lower extremity venous Doppler ultrasound perform earlier today, the appropriateness of IVC filter placement for temporary caval interruption will be discussed with the providing critical care service prior to vascular sheath removal. Electronically Signed   By: Sandi Mariscal M.D.   On: 08/23/2015 23:28   Ir Angiogram Selective Each Additional Vessel  08/23/2015  INDICATION: History of sub massive pulmonary embolism and right-sided heart strain. Request made for initiation of bilateral ultrasound assisted pulmonary arterial catheter directed thrombolysis. EXAM: 1. ULTRASOUND GUIDANCE FOR VENOUS ACCESS X2 2. BILATERAL PULMONARY ARTERIOGRAPHY 3. FLUOROSCOPIC GUIDED PLACEMENT OF BILATERAL PULMONARY ARTERIAL LYTIC INFUSION CATHETERS COMPARISON:  Chest CTA - earlier same day; right lower extremity venous Doppler ultrasound - earlier same day MEDICATIONS: Moderate (conscious) sedation was employed during this procedure. A total of Versed 1.5 mg and Fentanyl 75 mcg was administered intravenously. Moderate Sedation Time: 30 minutes. The patient's level of consciousness and vital signs were monitored continuously by radiology nursing throughout the procedure under my direct supervision. CONTRAST:  20 cc Isovue-300 FLUOROSCOPY TIME:  5 minutes 36 seconds (15 mGy) COMPLICATIONS: None immediate. TECHNIQUE: Informed written consent was obtained from the patient after a discussion of the risks, benefits and alternatives to treatment. Questions regarding the procedure were encouraged and answered. A timeout was  performed prior to the initiation of the procedure. Ultrasound scanning was performed of the right groin demonstrated patency of the right common femoral vein. As such, decision was made to perform the bilateral pulmonary arterial catheter thrombolysis from right common femoral vein approach. The right groin was draped in the usual sterile fashion,  and a sterile drape was applied covering the operative field. Maximum barrier sterile technique with sterile gowns and gloves were used for the procedure. A timeout was performed prior to the initiation of the procedure. The right femoral head was marked fluoroscopically. The right common femoral vein was accessed with adjacent micro puncture kit under direct ultrasound guidance. Ultrasound images were saved for procedural documentation purposes. This allowed for placement of adjacent 6 French vascular sheaths. With the use of a stiff glidewire, a vertebral catheter was advanced into the right main pulmonary artery and a limited right pulmonary arteriogram was performed with a hand injection. Pressure measurements were then obtained from the main pulmonary artery. Over a Rosen wire, the vertebral catheter was exchanged for a 105/18 cm multi side-hole EKOS ultrasound assisted infusion catheter. With the use of the stiff glidewire, the vertebral catheter was advanced into the left main pulmonary artery. Over an a Rosen wire, the vertebral catheter was exchanged for a 105/12 cm multi side-hole EKOS ultrasound assisted infusion catheter. A postprocedural fluoroscopic image was obtained of the check demonstrating final catheter positioning. Both vascular sheath were secured at the right neck with interrupted 2-0 Prolene sutures. The external catheter tubing was secured at the right chest and the lytic therapy was initiated. The patient tolerated the procedure well without immediate postprocedural complication. FINDINGS: Acquired pressure measurements: Main pulmonary artery -  55/19; mean - 34 (normal: < 25/10) Following the procedure, both ultrasound assisted infusion catheter tips terminate within the distal aspects of the bilateral lower lobe sub segmental pulmonary arteries. IMPRESSION: 1. Successful fluoroscopic guided initiation of bilateral ultrasound assisted catheter directed pulmonary arterial lysis for sub massive pulmonary embolism and right-sided heart strain. 2. Markedly elevated pressure measurements within the main pulmonary artery compatible with presumed acute pulmonary arterial hypertension. PLAN: - The patient will return to the interventional radiology suite in approximately 12 hours following the initiation of the catheter directed pulmonary arterial lysis for repeat pressure measurements. - Given presence of occlusive DVT within the right femoral and popliteal veins on right lower extremity venous Doppler ultrasound perform earlier today, the appropriateness of IVC filter placement for temporary caval interruption will be discussed with the providing critical care service prior to vascular sheath removal. Electronically Signed   By: Sandi Mariscal M.D.   On: 08/23/2015 23:28   US Venous Img Lower Unilateral Right  08/23/2015  CLINICAL DATA:  Right lower extremity pain and swelling. Evaluate for DVT. EXAM: Right LOWER EXTREMITY VENOUS DOPPLER ULTRASOUND TECHNIQUE: Gray-scale sonography with graded compression, as well as color Doppler and duplex ultrasound were performed to evaluate the lower extremity deep venous systems from the level of the common femoral vein and including the common femoral, femoral, profunda femoral, popliteal and calf veins including the posterior tibial, peroneal and gastrocnemius veins when visible. The superficial great saphenous vein was also interrogated. Spectral Doppler was utilized to evaluate flow at rest and with distal augmentation maneuvers in the common femoral, femoral and popliteal veins. COMPARISON:  None. FINDINGS: Expanded  veins with internal echoes affecting the distal right femoral vein, popliteal vein, and posterior tibial/peroneal calf veins consistent with occlusive thrombus. No internal color Doppler flow. Above the clot deep veins are patent with symmetric respiratory phasicity. These results will be called to the ordering clinician or representative by the Radiology Department at the imaging location prior to patient disposition. IMPRESSION: Positive for occlusive deep venous thrombosis from the distal right femoral vein into the calf. Electronically Signed   By: Angelica Chessman  Watts M.D.   On: 08/23/2015 13:53   Ir US Guide Vasc Access Right  08/23/2015  INDICATION: History of sub massive pulmonary embolism and right-sided heart strain. Request made for initiation of bilateral ultrasound assisted pulmonary arterial catheter directed thrombolysis. EXAM: 1. ULTRASOUND GUIDANCE FOR VENOUS ACCESS X2 2. BILATERAL PULMONARY ARTERIOGRAPHY 3. FLUOROSCOPIC GUIDED PLACEMENT OF BILATERAL PULMONARY ARTERIAL LYTIC INFUSION CATHETERS COMPARISON:  Chest CTA - earlier same day; right lower extremity venous Doppler ultrasound - earlier same day MEDICATIONS: Moderate (conscious) sedation was employed during this procedure. A total of Versed 1.5 mg and Fentanyl 75 mcg was administered intravenously. Moderate Sedation Time: 30 minutes. The patient's level of consciousness and vital signs were monitored continuously by radiology nursing throughout the procedure under my direct supervision. CONTRAST:  20 cc Isovue-300 FLUOROSCOPY TIME:  5 minutes 36 seconds (15 mGy) COMPLICATIONS: None immediate. TECHNIQUE: Informed written consent was obtained from the patient after a discussion of the risks, benefits and alternatives to treatment. Questions regarding the procedure were encouraged and answered. A timeout was performed prior to the initiation of the procedure. Ultrasound scanning was performed of the right groin demonstrated patency of the right  common femoral vein. As such, decision was made to perform the bilateral pulmonary arterial catheter thrombolysis from right common femoral vein approach. The right groin was draped in the usual sterile fashion, and a sterile drape was applied covering the operative field. Maximum barrier sterile technique with sterile gowns and gloves were used for the procedure. A timeout was performed prior to the initiation of the procedure. The right femoral head was marked fluoroscopically. The right common femoral vein was accessed with adjacent micro puncture kit under direct ultrasound guidance. Ultrasound images were saved for procedural documentation purposes. This allowed for placement of adjacent 6 French vascular sheaths. With the use of a stiff glidewire, a vertebral catheter was advanced into the right main pulmonary artery and a limited right pulmonary arteriogram was performed with a hand injection. Pressure measurements were then obtained from the main pulmonary artery. Over a Rosen wire, the vertebral catheter was exchanged for a 105/18 cm multi side-hole EKOS ultrasound assisted infusion catheter. With the use of the stiff glidewire, the vertebral catheter was advanced into the left main pulmonary artery. Over an a Rosen wire, the vertebral catheter was exchanged for a 105/12 cm multi side-hole EKOS ultrasound assisted infusion catheter. A postprocedural fluoroscopic image was obtained of the check demonstrating final catheter positioning. Both vascular sheath were secured at the right neck with interrupted 2-0 Prolene sutures. The external catheter tubing was secured at the right chest and the lytic therapy was initiated. The patient tolerated the procedure well without immediate postprocedural complication. FINDINGS: Acquired pressure measurements: Main pulmonary artery - 55/19; mean - 34 (normal: < 25/10) Following the procedure, both ultrasound assisted infusion catheter tips terminate within the distal  aspects of the bilateral lower lobe sub segmental pulmonary arteries. IMPRESSION: 1. Successful fluoroscopic guided initiation of bilateral ultrasound assisted catheter directed pulmonary arterial lysis for sub massive pulmonary embolism and right-sided heart strain. 2. Markedly elevated pressure measurements within the main pulmonary artery compatible with presumed acute pulmonary arterial hypertension. PLAN: - The patient will return to the interventional radiology suite in approximately 12 hours following the initiation of the catheter directed pulmonary arterial lysis for repeat pressure measurements. - Given presence of occlusive DVT within the right femoral and popliteal veins on right lower extremity venous Doppler ultrasound perform earlier today, the appropriateness of IVC filter placement for  temporary caval interruption will be discussed with the providing critical care service prior to vascular sheath removal. Electronically Signed   By: Sandi Mariscal M.D.   On: 08/23/2015 23:28   Ir Infusion Thrombol Arterial Initial (Kristen)  08/23/2015  INDICATION: History of sub massive pulmonary embolism and right-sided heart strain. Request made for initiation of bilateral ultrasound assisted pulmonary arterial catheter directed thrombolysis. EXAM: 1. ULTRASOUND GUIDANCE FOR VENOUS ACCESS X2 2. BILATERAL PULMONARY ARTERIOGRAPHY 3. FLUOROSCOPIC GUIDED PLACEMENT OF BILATERAL PULMONARY ARTERIAL LYTIC INFUSION CATHETERS COMPARISON:  Chest CTA - earlier same day; right lower extremity venous Doppler ultrasound - earlier same day MEDICATIONS: Moderate (conscious) sedation was employed during this procedure. A total of Versed 1.5 mg and Fentanyl 75 mcg was administered intravenously. Moderate Sedation Time: 30 minutes. The patient's level of consciousness and vital signs were monitored continuously by radiology nursing throughout the procedure under my direct supervision. CONTRAST:  20 cc Isovue-300 FLUOROSCOPY TIME:  5  minutes 36 seconds (15 mGy) COMPLICATIONS: None immediate. TECHNIQUE: Informed written consent was obtained from the patient after a discussion of the risks, benefits and alternatives to treatment. Questions regarding the procedure were encouraged and answered. A timeout was performed prior to the initiation of the procedure. Ultrasound scanning was performed of the right groin demonstrated patency of the right common femoral vein. As such, decision was made to perform the bilateral pulmonary arterial catheter thrombolysis from right common femoral vein approach. The right groin was draped in the usual sterile fashion, and a sterile drape was applied covering the operative field. Maximum barrier sterile technique with sterile gowns and gloves were used for the procedure. A timeout was performed prior to the initiation of the procedure. The right femoral head was marked fluoroscopically. The right common femoral vein was accessed with adjacent micro puncture kit under direct ultrasound guidance. Ultrasound images were saved for procedural documentation purposes. This allowed for placement of adjacent 6 French vascular sheaths. With the use of a stiff glidewire, a vertebral catheter was advanced into the right main pulmonary artery and a limited right pulmonary arteriogram was performed with a hand injection. Pressure measurements were then obtained from the main pulmonary artery. Over a Rosen wire, the vertebral catheter was exchanged for a 105/18 cm multi side-hole EKOS ultrasound assisted infusion catheter. With the use of the stiff glidewire, the vertebral catheter was advanced into the left main pulmonary artery. Over an a Rosen wire, the vertebral catheter was exchanged for a 105/12 cm multi side-hole EKOS ultrasound assisted infusion catheter. A postprocedural fluoroscopic image was obtained of the check demonstrating final catheter positioning. Both vascular sheath were secured at the right neck with  interrupted 2-0 Prolene sutures. The external catheter tubing was secured at the right chest and the lytic therapy was initiated. The patient tolerated the procedure well without immediate postprocedural complication. FINDINGS: Acquired pressure measurements: Main pulmonary artery - 55/19; mean - 34 (normal: < 25/10) Following the procedure, both ultrasound assisted infusion catheter tips terminate within the distal aspects of the bilateral lower lobe sub segmental pulmonary arteries. IMPRESSION: 1. Successful fluoroscopic guided initiation of bilateral ultrasound assisted catheter directed pulmonary arterial lysis for sub massive pulmonary embolism and right-sided heart strain. 2. Markedly elevated pressure measurements within the main pulmonary artery compatible with presumed acute pulmonary arterial hypertension. PLAN: - The patient will return to the interventional radiology suite in approximately 12 hours following the initiation of the catheter directed pulmonary arterial lysis for repeat pressure measurements. - Given presence of occlusive DVT  within the right femoral and popliteal veins on right lower extremity venous Doppler ultrasound perform earlier today, the appropriateness of IVC filter placement for temporary caval interruption will be discussed with the providing critical care service prior to vascular sheath removal. Electronically Signed   By: Sandi Mariscal M.D.   On: 08/23/2015 23:28   Ir Infusion Thrombol Arterial Initial (Kristen)  08/23/2015  INDICATION: History of sub massive pulmonary embolism and right-sided heart strain. Request made for initiation of bilateral ultrasound assisted pulmonary arterial catheter directed thrombolysis. EXAM: 1. ULTRASOUND GUIDANCE FOR VENOUS ACCESS X2 2. BILATERAL PULMONARY ARTERIOGRAPHY 3. FLUOROSCOPIC GUIDED PLACEMENT OF BILATERAL PULMONARY ARTERIAL LYTIC INFUSION CATHETERS COMPARISON:  Chest CTA - earlier same day; right lower extremity venous Doppler ultrasound  - earlier same day MEDICATIONS: Moderate (conscious) sedation was employed during this procedure. A total of Versed 1.5 mg and Fentanyl 75 mcg was administered intravenously. Moderate Sedation Time: 30 minutes. The patient's level of consciousness and vital signs were monitored continuously by radiology nursing throughout the procedure under my direct supervision. CONTRAST:  20 cc Isovue-300 FLUOROSCOPY TIME:  5 minutes 36 seconds (15 mGy) COMPLICATIONS: None immediate. TECHNIQUE: Informed written consent was obtained from the patient after a discussion of the risks, benefits and alternatives to treatment. Questions regarding the procedure were encouraged and answered. A timeout was performed prior to the initiation of the procedure. Ultrasound scanning was performed of the right groin demonstrated patency of the right common femoral vein. As such, decision was made to perform the bilateral pulmonary arterial catheter thrombolysis from right common femoral vein approach. The right groin was draped in the usual sterile fashion, and a sterile drape was applied covering the operative field. Maximum barrier sterile technique with sterile gowns and gloves were used for the procedure. A timeout was performed prior to the initiation of the procedure. The right femoral head was marked fluoroscopically. The right common femoral vein was accessed with adjacent micro puncture kit under direct ultrasound guidance. Ultrasound images were saved for procedural documentation purposes. This allowed for placement of adjacent 6 French vascular sheaths. With the use of a stiff glidewire, a vertebral catheter was advanced into the right main pulmonary artery and a limited right pulmonary arteriogram was performed with a hand injection. Pressure measurements were then obtained from the main pulmonary artery. Over a Rosen wire, the vertebral catheter was exchanged for a 105/18 cm multi side-hole EKOS ultrasound assisted infusion catheter.  With the use of the stiff glidewire, the vertebral catheter was advanced into the left main pulmonary artery. Over an a Rosen wire, the vertebral catheter was exchanged for a 105/12 cm multi side-hole EKOS ultrasound assisted infusion catheter. A postprocedural fluoroscopic image was obtained of the check demonstrating final catheter positioning. Both vascular sheath were secured at the right neck with interrupted 2-0 Prolene sutures. The external catheter tubing was secured at the right chest and the lytic therapy was initiated. The patient tolerated the procedure well without immediate postprocedural complication. FINDINGS: Acquired pressure measurements: Main pulmonary artery - 55/19; mean - 34 (normal: < 25/10) Following the procedure, both ultrasound assisted infusion catheter tips terminate within the distal aspects of the bilateral lower lobe sub segmental pulmonary arteries. IMPRESSION: 1. Successful fluoroscopic guided initiation of bilateral ultrasound assisted catheter directed pulmonary arterial lysis for sub massive pulmonary embolism and right-sided heart strain. 2. Markedly elevated pressure measurements within the main pulmonary artery compatible with presumed acute pulmonary arterial hypertension. PLAN: - The patient will return to the interventional radiology suite  in approximately 12 hours following the initiation of the catheter directed pulmonary arterial lysis for repeat pressure measurements. - Given presence of occlusive DVT within the right femoral and popliteal veins on right lower extremity venous Doppler ultrasound perform earlier today, the appropriateness of IVC filter placement for temporary caval interruption will be discussed with the providing critical care service prior to vascular sheath removal. Electronically Signed   By: Sandi Mariscal M.D.   On: 08/23/2015 23:28   Ir Jacolyn Reedy F/u Eval Art/ven Final Day (Kristen)  08/24/2015  INDICATION: 12 hours post bilateral pulmonary artery EKOS  lysis. The patient feels much better, with improved dyspnea. EXAM: IR THROMB F/U EVAL ART/VEN FINAL DAY COMPARISON:  None. MEDICATIONS: None. ANESTHESIA/SEDATION: None FLUOROSCOPY TIME:  Fluoroscopy Time:  minutes 6 seconds ( mGy). COMPLICATIONS: None immediate. TECHNIQUE: Fluoroscopy confirms stable position of the bilateral pulmonary artery catheters. The right pulmonary artery 1 catheter was retracted into the main pulmonary artery and a pressure was obtained. Post lysed cysts pressure was measured at 55/19 mm Hg with a mean of 32 mm Hg. The pulmonary artery catheters and right groin sheaths were removed. Hemostasis was achieved with direct pressure. FINDINGS: Imaging confirms stable position of the bilateral pulmonary artery catheters. IMPRESSION: After 12 hours of lysis, pulmonary artery pressures remained stable. Final mean pressure of 32 mm Hg was determined. Electronically Signed   By: Marybelle Killings M.D.   On: 08/24/2015 16:00    Labs:  CBC:  Recent Labs  08/24/15 0608 08/24/15 1155 08/24/15 2130 08/25/15 0500  WBC 13.9* 20.1* 13.5* 12.5*  HGB 9.9* 10.0* 9.3* 9.4*  HCT 32.2* 32.9* 30.8* 31.1*  PLT 260 241 250 256    COAGS:  Recent Labs  11/03/14 1030 06/29/15 1205 08/23/15 1545 08/25/15 0500  INR 1.03 1.13 1.27 1.54*  APTT  --   --  27 113*    BMP:  Recent Labs  08/02/15 0451 08/23/15 1545 08/23/15 1549 08/24/15 0608 08/25/15 0500  NA 138 138 140 139 137  K 4.7 3.4* 3.5 4.3 4.3  CL 111 106 108 111 113*  CO2 24 20*  --  18* 19*  GLUCOSE 137* 144* 141* 149* 140*  BUN 13 27* 27* 16 28*  CALCIUM 7.9* 9.4  --  7.8* 8.2*  CREATININE 0.98 0.96 0.90 0.84 1.08*  GFRNONAA 55* 57*  --  >60 49*  GFRAA >60 >60  --  >60 57*    LIVER FUNCTION TESTS:  Recent Labs  11/03/14 1030 01/19/15 1527 06/29/15 1205  BILITOT 0.4 0.4 0.4  AST 20 14 15   ALT 13* 8 11*  ALKPHOS 86 73 80  PROT 7.6 7.3 7.9  ALBUMIN 4.0 3.9  3.9 3.7    Assessment and Plan:  S/P PE lysis by  Dr. Pascal Lux and Dr. Barbie Banner Doing well.  Will sign off.  Electronically Signed: Murrell Redden 08/25/2015, 12:36 PM   I spent a total of 15 Minutes at the the patient's bedside AND on the patient's hospital floor or unit, greater than 50% of which was counseling/coordinating care for PE lysis.

## 2015-08-25 NOTE — Discharge Summary (Signed)
Physician Discharge Summary  Patient ID: SHEENA DONEGAN MRN: [id number] DOB/AGE: [birth year] 75 y.o.  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 BLamonte Sakaiarranged  Prior plan was to wean off Breo (see 07/03/15 Dr. 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  With resolution of clot, RV changes should resolve.   Hypokalemia  Acute Renal Insufficiency   Discharge Plan: Follow up BMP at time of discharge  GERD with Esophagitis  Recent Surgical Repair of Hernia Hx of Diverticulitis with Perforation s/p Colostomy with Revision 2016  Discharge Plan: Continue home protonix.  Previous considerations to change to H2 blocker with concerns for osteoporosis.  Pain secondary to PE  Discharge Plan: Resolved, no acute interventions at time of discharge.                   DISCHARGE SUMMARY   LAVIN UPPERMANis a 75y.o. y/o female with a PMH of osteoporosis, GERD on PPI, anxiety, recurrent PNA and which is significant for diverticulitis with bowel perforation 2/16 requiring colostomy. Colostomy was reversed in 10/2014. She had several abdominal hernias due to surgeries which were repaired 5/23 2017, she was discharged 5/28. Since time of discharge she has been having R leg cramping and swelling. She also developed shortness of breath and productive cough 2 days prior to arrival and R sided shoulder pain. She made these complaints to her PCP who scheduled her for RLE doppler, which was positive for DVT. She was very SOB during the doppler and tech was concerned that she had PE and referred her back to PCP, who sent her to ED. In ED she was found to be tachycardic, dyspneic, and tachypneic. With known DVT she was sent for CTA chest, which demonstrated saddle PE with large clot burden. RV to LV ratio on CT was measured at 1.7. Bedside [facility] demonstrated significant R heart strain. EKG concerning for R heart strain as well with S1Q3 and ?T3. She was planned for admission to SDU under the  hospitalist team and pulmonary was consulted. TTE demonstrated flattening of the intraventricular septum consistent with RV volume and pressure overload.  She was evaluated by Interventional Radiology for EKO's / catheter directed lysis.  The patient was agreeable to procedure.  PE felt to be provoked.  She was underwent EKO's per IR and was transferred to ICU for observation.  The patient was treated with IV heparin and transitioned to Eliquis on 6/16.  She was evaluated for home O2 needs prior to discharge and no O2 was required.  On 6/17, she was medically cleared for discharge with plans as above.      STUDIES:  CTA chest 6/14 >> Large saddle pulmonary embolus with bilateral significant clot burden. RV/LV 1.7. Right lower lobe pulmonary infarct. 1.6 cm nodule left upper lobe. LE dopplers  6/14 >> Occlusive DVT distal right femoral vein into the calf. TTE 6/14 >> EF 55-60 percent with normal wall motion. No aortic stenosis or regurgitation. No mitral stenosis or regurgitation. RV dilated with moderately reviewed to severely reduced systolic function. Flattening of the intraventricular septum consistent with RV volume and pressure overload. Pulmonary artery systolic pressure 53 mmHg. LA normal in size. RA mildly dilated.  MICROBIOLOGY: MRSA PCR >> negative  ANTIBIOTICS: NONE  SIGNIFICANT EVENTS: 04/2014  Diverticulitis with perf. Colostomy placed 10/2014  Colostomy revised 08/01/2015  Admit for hernia repair 6/14 admit for PE/DVT  LINES/TUBES: PIV x2  Discharge Exam: General: Female of normal body habitus in NAD.  Neuro: Alert, oriented, non-focal HEENT: Mark/AT, PERRL, no JVD Cardiovascular: Tachy, regular, no JVD Lungs: Clear bilateral breath sounds. No accessory use  Abdomen: Soft, non-tender, non-distended. Multiple scars (some recent) representing laproscopy Musculoskeletal: No acute deformity or ROM limitation Skin: Grossly intact with exception of abdominal scars.   Filed Vitals:   08/25/15 1908 08/25/15 2152 08/26/15 0454 08/26/15 1319  BP: 133/46 122/59 100/58 120/55  Pulse: 102 96 86 88  Temp: 97.8 F (36.6 C)   98.4 F (36.9 C)  TempSrc: Oral   Oral  Resp: 22 20 20 19   Height:      Weight:      SpO2: 96% 93% 96% 96%     Discharge Labs  BMET  Recent Labs Lab 08/23/15 1545 08/23/15 1549 08/24/15 0608 08/25/15 0500 08/26/15 0550  NA 138 140 139 137  --   K 3.4* 3.5 4.3 4.3  --   CL 106 108 111 113*  --   CO2 20*  --  18* 19*  --   GLUCOSE 144* 141* 149* 140*  --   BUN 27* 27* 16 28*  --   CREATININE 0.96 0.90 0.84 1.08*  --   CALCIUM 9.4  --  7.8* 8.2*  --   MG 1.6*  --   --  1.5* 2.6*  PHOS  --   --   --  2.1* 3.0   CBC  Recent Labs Lab 08/24/15 2130 08/25/15 0500 08/26/15 0550  HGB 9.3* 9.4* 9.1*  HCT 30.8* 31.1*  29.7*  WBC 13.5* 12.5* 9.2  PLT 250 256 252   Anti-Coagulation  Recent Labs Lab 08/23/15 1545 08/25/15 0500  INR 1.27 1.54*   Discharge Instructions    Diet - low sodium heart healthy    Complete by:  As directed      Discharge instructions    Complete by:  As directed   Complete the  1) Be sure to complete the loading dose of eliquis 10 mg (2 tabs twice a day) then  after these are complete start 40m twice a day 2) be sure to let any doctor know you are on blood thinners before you start any medication  3) be sure to let dentist know before dental work 4) report to the ER for any bleeding that you can not stop     Increase activity slowly    Complete by:  As directed                 Follow-up Information    Follow up with BCollene Gobble, MD On 10/17/2015.   Specialty:  Pulmonary Disease   Why:  Appt at 11:00 AM    Contact information:   [phone]      Follow up with AWalker Kehr MD. Schedule an appointment as soon as possible for a visit in 1 week.   Specialty:  Internal Medicine   Why:  Call for an appointment to be seen in 1-2 weeks for hospital follow up   Contact information:   [address] [place] [phone]         Medication List    STOP taking these medications        cefdinir 300 MG capsule  Commonly known as:  OMNICEF     fluticasone 50 MCG/ACT nasal spray  Commonly known as:  FLONASE      TAKE these medications        apixaban 5 MG Tabs tablet  Commonly known as:  ELIQUIS  Take 2 tablets (10 mg total) by mouth 2 (two) times daily.     apixaban 5 MG Tabs tablet  Commonly known as:  ELIQUIS  Take 1 tablet (5 mg total) by mouth 2 (two) times daily. Start after loading dose completed.  Start taking on:  08/31/2015     benzonatate 200 MG capsule  Commonly known as:  TESSALON  TAKE 1 CAPSULE TWICE DAILY AS NEEDED FOR COUGH.     calcium carbonate 600 MG Tabs tablet   Commonly known as:  OS-CAL  Take 600 mg by mouth 2 (two) times daily with a meal. Reported on 06/16/2015     Cholecalciferol 1000 units tablet  Take 2,000 Units by mouth 2 (two) times daily. Reported on 07/03/2015     fluticasone furoate-vilanterol 100-25 MCG/INH Aepb  Commonly known as:  BREO ELLIPTA  Inhale 1 puff into the lungs daily.     ibuprofen 200 MG tablet  Commonly known as:  ADVIL,MOTRIN  Take 200 mg by mouth 2 (two) times daily.     multivitamin tablet  Take 1 tablet by mouth daily.     ondansetron 4 MG disintegrating tablet  Commonly known as:  ZOFRAN ODT  Take 1 tablet (4 mg total) by mouth every 6 (six) hours as needed for nausea or vomiting.     OVER THE COUNTER MEDICATION  guiafenesin - sudafed er [id number] mg as needed     pantoprazole 40 MG tablet  Commonly known as:  PROTONIX  Take 1 tablet (40 mg total) by mouth daily.     RHINOCORT ALLERGY 32 MCG/ACT nasal spray  Generic drug:  budesonide  Place 1 spray into both nostrils daily.     sennosides-docusate sodium 8.6-50 MG tablet  Commonly known as:  SENOKOT-S  Take 1 tablet by mouth daily.          Disposition:  Home. No new home health needs identified prior to discharge.    Discharged  Condition: JAELYNE DEEG has met maximum benefit of inpatient care and is medically stable and cleared for discharge.  Patient is pending follow up as above.      Time spent on disposition:  Greater than 35 minutes.   Signed: Erick Colace ACNP-BC [place] Pager # [phone] OR # [phone] if no answer   Patient seen and examined, agree with above note.  I dictated the care and orders written for this patient under my direction.  Rush Farmer, MD [phone]

## 2015-08-25 NOTE — Progress Notes (Signed)
PULMONARY / CRITICAL CARE MEDICINE   Name: Kristen Castillo MRN: [id number] DOB: 1941-02-03    ADMISSION DATE:  08/23/2015 CONSULTATION DATE:  08/23/2015  REFERRING MD:  Dr. 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 Colostomy was reversed in 10/2014. She had several abdominal hernias due to surgeries which were repaired 5/23 2017, she was discharged 5/28. Since time of discharge she has been having R leg cramping and swelling. She also developed shortness of breath and productive cough 2 days prior to arrival. Also with R sided shoulder pain. She made these complaints to her PCP who scheduled her for RLE doppler, which demonstrated DVT. She was very SOB during the doppler and tech was concerned that she had PE and referred her back to PCP, who sent her to ED. In ED she was found to be tachycardic, dyspneic, and tachypneic. With known DVT she was sent for CTA chest, which demonstrated saddle PE with large clot burden. RV to LV ratio on CT was measured at 1.7. Bedside US in ED demonstrated significant R heart strain. EKG concerning for R heart strain as well with S1Q3 and ?T3. She was planned for admission to SDU under the hospitalist team and pulmonary was consulted.     SUBJECTIVE:  Comfortable No new issues Still on [place] O2   VITAL SIGNS: BP 103/65 mmHg  Pulse 84  Temp(Src) 97.3 F (36.3 C) (Oral)  Resp 22  Ht 5' 2.25" (1.581 m)  Wt 52.8 kg (116 lb 6.5 oz)  BMI 21.12 kg/m2  SpO2 97%  HEMODYNAMICS:    VENTILATOR SETTINGS:    INTAKE / OUTPUT: I/O last 3 completed shifts: In: 1875.5 [I.V.:1775.5; IV Piggyback:100] Out: 450 [Urine:450]  PHYSICAL EXAMINATION: General:  Female of normal body habitus in NAD.   Neuro:  Alert, oriented, non-focal HEENT:  [place]/AT, PERRL, no JVD Cardiovascular:  Tachy, regular, no JVD Lungs:  Tachypnea and  increased WOB on 3 L . Clear bilateral breath sounds. Abdomen:  Soft, non-tender, non-distended. Multiple scars (some recent) representing laproscopy Musculoskeletal:  No acute deformity or ROM limitation Skin:  Grossly intact with exception of abdominal scars.   LABS:  BMET  Recent Labs Lab 08/23/15 1545 08/23/15 1549 08/24/15 0608 08/25/15 0500  NA 138 140 139 137  K 3.4* 3.5 4.3 4.3  CL 106 108 111 113*  CO2 20*  --  18* 19*  BUN 27* 27* 16 28*  CREATININE 0.96 0.90 0.84 1.08*  GLUCOSE 144* 141* 149* 140*    Electrolytes  Recent Labs Lab 08/23/15 1545 08/24/15 0608 08/25/15 0500  CALCIUM 9.4 7.8* 8.2*  MG 1.6*  --  1.5*  PHOS  --   --  2.1*    CBC  Recent Labs Lab 08/24/15 1155 08/24/15 2130 08/25/15 0500  WBC 20.1* 13.5* 12.5*  HGB 10.0* 9.3* 9.4*  HCT 32.9* 30.8* 31.1*  PLT 241 250 256    Coag's  Recent Labs Lab 08/23/15 1545 08/25/15 0500  APTT 27 113*  INR 1.27 1.54*    Sepsis Markers  Recent Labs Lab 08/23/15 1855  LATICACIDVEN 1.3    ABG No results for input(s): PHART, PCO2ART, PO2ART in the last 168 hours.  Liver Enzymes No results for input(s): AST, ALT, ALKPHOS, BILITOT, ALBUMIN in the last 168 hours.  Cardiac Enzymes  Recent Labs Lab 08/23/15 1545 08/24/15 0129 08/24/15 [id number]  TROPONINI 1.62* 1.36* 0.94*    Glucose No results for input(s): GLUCAP in the last 168 hours.  Imaging Ir Jacolyn Reedy F/u Eval Art/ven Final Day (ms)  08/24/2015  INDICATION: 12 hours post bilateral pulmonary artery EKOS lysis. The patient feels much better, with improved dyspnea. EXAM: IR THROMB F/U EVAL ART/VEN FINAL DAY COMPARISON:  None. MEDICATIONS: None. ANESTHESIA/SEDATION: None FLUOROSCOPY TIME:  Fluoroscopy Time:  minutes 6 seconds ( mGy). COMPLICATIONS: None immediate. TECHNIQUE: Fluoroscopy confirms stable position of the bilateral pulmonary artery catheters. The right pulmonary artery 1 catheter was retracted into the main pulmonary  artery and a pressure was obtained. Post lysed cysts pressure was measured at 55/19 mm Hg with a mean of 32 mm Hg. The pulmonary artery catheters and right groin sheaths were removed. Hemostasis was achieved with direct pressure. FINDINGS: Imaging confirms stable position of the bilateral pulmonary artery catheters. IMPRESSION: After 12 hours of lysis, pulmonary artery pressures remained stable. Final mean pressure of 32 mm Hg was determined. Electronically Signed   By: Marybelle Killings M.D.   On: 08/24/2015 16:00    STUDIES:  CTA chest 6/14:  Large saddle pulmonary embolus with bilateral significant clot burden. RV/LV 1.7. Right lower lobe pulmonary infarct. 1.6 cm nodule left upper lobe. LE dopplers 6/14:  Occlusive DVT distal right femoral vein into the calf. TTE 6/14:  EF 55-60 percent with normal wall motion. No aortic stenosis or regurgitation. No mitral stenosis or regurgitation. RV dilated with moderately reviewed to severely reduced systolic function. Flattening of the intraventricular septum consistent with RV volume and pressure overload. Pulmonary artery systolic pressure 53 mmHg. LA normal in size. RA mildly dilated.  MICROBIOLOGY: MRSA PCR >> negative  ANTIBIOTICS: NONE  SIGNIFICANT EVENTS: 04/2014 diverticulitis with perf. 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 Concern for RV strain on CT and bedside echo in ED. Admitted to ICU, received catheter directed thrombolysis.   ASSESSMENT / PLAN:  HEMATOLOGIC A:   Acute saddle pulmonary embolism RLE DVT R arm pain concern for further DVT  P:  EKOS per IR, then to heparin gtt Plan start Eliquis on 6/16, d/c heparin hypercoag workup in the future after off anticoag. This appears to have been a provoked clot, which  means she will need 3-6 months anticoag  PULMONARY A: Acute hypoxemic respiratory failure secondary to PE H/o recurrent PNA thought secondary to GERD Pleuritic chest pain  P:   Supplemental O2 to keep SpO2 > 92% Tessalon Perles prn cough See Heme toradol prn ordered 6/15 > note increased S Cr 6/16, will stop this.   CARDIOVASCULAR A:  Moderate to severe RV dysfxn due to PE  P:  Will need repeat TTE in several months to reassess for improved fxn  RENAL A:   Hypokalemia - Replacing IV. Acute renal insuff, prob due to volume status and toradol  P:   Follow electrolytes & renal function Trending UOP Correct electrolytes as indicated  GASTROINTESTINAL A:   Recent surgical repair of hernias GERD with esophagitis H/o diverticulitis with perforation s/p colostomy and revision 2016  P:   Protonix EC for SUP Advance diet  INFECTIOUS A:   Leukocytosis - Likely reactive/demargination.  P:   Monitor off ABX Trend WBC and fever curve  ENDOCRINE A:   No  acute issues  P:   Follow glucose on daily chemistry  NEUROLOGIC A:   Pain  P:   Monitor Stopped toradol   FAMILY  - Updates: Patient and husband updated 6/16.   - Inter-disciplinary family meet or Palliative Care meeting due by:  6/21  To floor bed 6/16. Mobilize and try to wean O2 to off. Plan for d/c home on 6/17 if eliquis well tolerated.    Baltazar Apo, MD, PhD 08/25/2015, 10:45 AM  Pulmonary and Critical Care [phone] or if no answer [phone]

## 2015-08-25 NOTE — Care Management Important Message (Signed)
Important Message  Patient Details  Name: Kristen Castillo MRN: [id number] Date of Birth: March 30, 1940   Medicare Important Message Given:  Yes    Nathen May 08/25/2015, 12:06 PM

## 2015-08-26 LAB — CBC
HEMATOCRIT: 29.7 % — AB (ref 36.0–46.0)
Hemoglobin: 9.1 g/dL — ABNORMAL LOW (ref 12.0–15.0)
MCH: 26.9 pg (ref 26.0–34.0)
MCHC: 30.6 g/dL (ref 30.0–36.0)
MCV: 87.9 fL (ref 78.0–100.0)
Platelets: 252 10*3/uL (ref 150–400)
RBC: 3.38 MIL/uL — ABNORMAL LOW (ref 3.87–5.11)
RDW: 14.2 % (ref 11.5–15.5)
WBC: 9.2 10*3/uL (ref 4.0–10.5)

## 2015-08-26 LAB — GLUCOSE, CAPILLARY
Glucose-Capillary: 144 mg/dL — ABNORMAL HIGH (ref 65–99)
Glucose-Capillary: 115 mg/dL — ABNORMAL HIGH (ref 65–99)

## 2015-08-26 LAB — MAGNESIUM: Magnesium: 2.6 mg/dL — ABNORMAL HIGH (ref 1.7–2.4)

## 2015-08-26 LAB — PHOSPHORUS: Phosphorus: 3 mg/dL (ref 2.5–4.6)

## 2015-08-26 MED ORDER — APIXABAN 5 MG PO TABS: 10.0000 mg | ORAL_TABLET | Freq: Two times a day (BID) | ORAL | Status: DC

## 2015-08-26 MED ORDER — APIXABAN 5 MG PO TABS: 5.0000 mg | ORAL_TABLET | Freq: Two times a day (BID) | ORAL | Status: DC

## 2015-08-26 NOTE — Progress Notes (Signed)
  Patient Saturations on Room Air at Rest = 97%  Patient Saturations on Room Air while Ambulating = 90%  Patient does well ambulating without oxygen at the moderate speed she was walking at home.

## 2015-08-26 NOTE — Progress Notes (Signed)
[name] to be D/C'd to home per MD order.  Discussed with the patient and all questions fully answered.  VSS, Skin clean, dry and intact without evidence of skin break down, no evidence of skin tears noted. IV catheter discontinued intact. Site without signs and symptoms of complications. Dressing and pressure applied.  An After Visit Summary was printed and given to the patient. Patient received prescription.  D/c education completed with patient/family including follow up instructions, medication list, d/c activities limitations if indicated, with other d/c instructions as indicated by MD - patient able to verbalize understanding, all questions fully answered.   Patient instructed to return to ED, call 911, or call MD for any changes in condition.   Patient escorted via [place], and D/C home via private auto.  Morley Kos Price 08/26/2015 5:05 PM

## 2015-08-27 ENCOUNTER — Encounter: Payer: Self-pay | Admitting: Emergency Medicine

## 2015-08-28 ENCOUNTER — Encounter: Payer: Self-pay | Admitting: Emergency Medicine

## 2015-08-29 ENCOUNTER — Ambulatory Visit (INDEPENDENT_AMBULATORY_CARE_PROVIDER_SITE_OTHER): Payer: Medicare Other | Admitting: Emergency Medicine

## 2015-08-29 ENCOUNTER — Encounter: Payer: Self-pay | Admitting: Emergency Medicine

## 2015-08-29 VITALS — BP 136/82 | HR 97 | Ht 62.25 in | Wt 122.0 lb

## 2015-08-29 DIAGNOSIS — K219 Gastro-esophageal reflux disease without esophagitis: Secondary | ICD-10-CM

## 2015-08-29 DIAGNOSIS — R05 Cough: Secondary | ICD-10-CM

## 2015-08-29 DIAGNOSIS — I2602 Saddle embolus of pulmonary artery with acute cor pulmonale: Secondary | ICD-10-CM | POA: Diagnosis not present

## 2015-08-29 DIAGNOSIS — J309 Allergic rhinitis, unspecified: Secondary | ICD-10-CM

## 2015-08-29 DIAGNOSIS — R059 Cough, unspecified: Secondary | ICD-10-CM

## 2015-08-29 MED ORDER — PANTOPRAZOLE SODIUM 40 MG PO TBEC: DELAYED_RELEASE_TABLET | ORAL | Status: DC

## 2015-08-29 NOTE — Assessment & Plan Note (Signed)
Continue current regimen

## 2015-08-29 NOTE — Assessment & Plan Note (Signed)
Increase Protonix to twice a day for 2 weeks and then go back to once a day

## 2015-08-29 NOTE — Progress Notes (Signed)
Subjective:    Patient ID: Kristen Castillo, female    DOB: 1941-03-08, 75 y.o.   MRN: [id number]  HPI 75 year old never smoker known to me from recent hospitalization. 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 She had several abdominal hernias and required mesh placement in May 2017. I met her in June 2017 after she was admitted for a large right lower extremity DVT and associated saddle pulmonary embolism. She underwent targeted lytic therapy and was admitted to the ICU. She recovered well without any evidence of hypoxemia. Her echocardiogram initially showed evidence of right heart strain. She was discharged on Eliquis. She has been having cough since discharge home. Clear mucous. She has been on loratadine, rhinocort, restarted delsym and tessalon perles for a few months. She is on protonix qd.    Review of Systems As per HPI   Past Medical History  Diagnosis Date  . Hematuria     in past  . Hair loss   . Unspecified vitamin D deficiency   . Migraines     in past  . Osteoporosis   . History of creation of ostomy     past partial colectomy in 05/03/14  . Complication of anesthesia   . PONV (postoperative nausea and vomiting)   . GERD (gastroesophageal reflux disease)   . Diverticulitis   . Kidney cysts   . Persistent cough   . History of skin cancer   . Deviated nasal septum   . Anxiety   . Cancer ([place])     hx skin cancer  . Ventral hernia   . Congestion of both ears   . Pneumonia     july 2016, pneumonia-06/2015   . Osteoarthritis     fingers   . Osteoporosis      Family History  Problem Relation Age of Onset  . Arthritis Mother   . Colon cancer Mother 9  . Heart disease Father   . Dementia Father   . Diabetes Sister   . Cancer Maternal Grandfather     Lung cancer  . Stomach cancer Neg Hx      Social History   Social History  . Marital Status: Married    Spouse Name: N/A  .  Number of Children: N/A  . Years of Education: N/A   Occupational History  . Not on file.   Social History Main Topics  . Smoking status: Never Smoker   . Smokeless tobacco: Never Used  . Alcohol Use: No  . Drug Use: No  . Sexual Activity: Not on file   Other Topics Concern  . 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 Codeine Nausea And Vomiting  . Dilaudid [Hydromorphone Hcl] Other (See Comments)    "too strong - I can't wake up"  . Sulfonamide Derivatives Other (See Comments)    Childhood allergy; reaction unknown  . Tape Other (See Comments)    REDDNESS, BLISTER/  Use paper tape  . Norco [Hydrocodone-Acetaminophen] Rash     Outpatient Prescriptions Prior to Visit  Medication Sig Dispense Refill  . apixaban (ELIQUIS) 5 MG TABS tablet Take 2 tablets (10 mg total) by mouth  2 (two) times daily. 24 tablet 0  . budesonide (RHINOCORT ALLERGY) 32 MCG/ACT nasal spray Place 1 spray into both nostrils daily.    Marland Kitchen ibuprofen (ADVIL,MOTRIN) 200 MG tablet Take 200 mg by mouth 2 (two) times daily.    Marland Kitchen OVER THE COUNTER MEDICATION guiafenesin - sudafed er 68m/60 mg as needed    . pantoprazole (PROTONIX) 40 MG tablet Take 1 tablet (40 mg total) by mouth daily. 30 tablet 6  . sennosides-docusate sodium (SENOKOT-S) 8.6-50 MG tablet Take 1 tablet by mouth daily.    . calcium carbonate (OS-CAL) 600 MG TABS tablet Take 600 mg by mouth 2 (two) times daily with a meal. Reported on 08/29/2015    . Cholecalciferol 1000 UNITS tablet Take 2,000 Units by mouth 2 (two) times daily. Reported on 08/29/2015    . Multiple Vitamin (MULTIVITAMIN) tablet Take 1 tablet by mouth daily. Reported on 08/29/2015    . ondansetron (ZOFRAN ODT) 4 MG disintegrating tablet Take 1 tablet (4 mg total)  by mouth every 6 (six) hours as needed for nausea or vomiting. (Patient not taking: Reported on 08/29/2015) 30 tablet 0  . [START ON 08/31/2015] apixaban (ELIQUIS) 5 MG TABS tablet Take 1 tablet (5 mg total) by mouth 2 (two) times daily. Start after loading dose completed. 60 tablet 6  . benzonatate (TESSALON) 200 MG capsule TAKE 1 CAPSULE TWICE DAILY AS NEEDED FOR COUGH. (Patient not taking: Reported on 08/23/2015) 20 capsule 0  . fluticasone furoate-vilanterol (BREO ELLIPTA) 100-25 MCG/INH AEPB Inhale 1 puff into the lungs daily. (Patient not taking: Reported on 08/23/2015) 1 each 5   No facility-administered medications prior to visit.        Objective:   Physical Exam Filed Vitals:   08/29/15 1043  BP: 136/82  Pulse: 97  Height: 5' 2.25" (1.581 m)  Weight: 122 lb (55.339 kg)  SpO2: 96%    Gen: Pleasant, well-nourished, in no distress,  normal affect  ENT: No lesions,  mouth clear,  oropharynx clear, no postnasal drip  Neck: No JVD, no TMG, no carotid bruits  Lungs: No use of accessory muscles, small breaths, no wheeze.   Cardiovascular: RRR, heart sounds normal, no murmur or gallops, R > L LE edema  Abdomen: she has a binder in place  Musculoskeletal: No deformities, no cyanosis or clubbing  Neuro: alert, non focal  Skin: Warm, no lesions or rashes      Assessment & Plan:  Pulmonary embolism (HCC) On systemic anticoagulation. Continue same for at least 6 months and then assess to d/c  Cough History of chronic cough with influences from chronic postnasal drip, allergies, probable reflux. I suspect that her recent hospitalization, atelectasis, pulmonary emboli and potential injury to lung parenchyma are all contributors. We will try to address her allergies and her reflux more aggressively. Push pulmonary hygiene.   GERD (gastroesophageal reflux disease) Increase Protonix to twice a day for 2 weeks and then go back to once a day  Allergic rhinitis Continue current  regimen   RBaltazar Apo MD, PhD 08/29/2015, 2:32 PM [facility] [phone] if no answer [phone]

## 2015-08-29 NOTE — Assessment & Plan Note (Signed)
History of chronic cough with influences from chronic postnasal drip, allergies, probable reflux. I suspect that her recent hospitalization, atelectasis, pulmonary emboli and potential injury to lung parenchyma are all contributors. We will try to address her allergies and her reflux more aggressively. Push pulmonary hygiene.

## 2015-08-29 NOTE — Patient Instructions (Signed)
We will increase your protonix to 40mg  twice a day for 2 weeks, then go back to once a day until our next visit.  Please continue your rhinocort, loratadine, delsym as needed, tessalon perles.  Please continue your Eliquis as directed.  Follow with Dr Lamonte Sakai in August as planned.

## 2015-08-29 NOTE — Assessment & Plan Note (Signed)
On systemic anticoagulation. Continue same for at least 6 months and then assess to d/c

## 2015-08-31 ENCOUNTER — Telehealth: Payer: Self-pay | Admitting: Emergency Medicine

## 2015-08-31 MED ORDER — PANTOPRAZOLE SODIUM 40 MG PO TBEC: DELAYED_RELEASE_TABLET | ORAL | Status: DC

## 2015-08-31 NOTE — Telephone Encounter (Signed)
Pantoprazole prescription was sent in as 2 tablets BID x 14 days, then 1 tablet daily.  Is this correct?  If so, patient will need PA for the 4 tablets daily  [phone] for PA ID: [id number]   Dr. Lamonte Sakai, please advise.

## 2015-08-31 NOTE — Telephone Encounter (Signed)
It should be protonix 40mg  PO BID x 2 weeks, then back to 40mg  qd thereafter

## 2015-08-31 NOTE — Telephone Encounter (Signed)
Resubmitted the Protonix with the correct instructions. Nothing further needed.

## 2015-09-07 ENCOUNTER — Ambulatory Visit (INDEPENDENT_AMBULATORY_CARE_PROVIDER_SITE_OTHER): Payer: Medicare Other | Admitting: Internal Medicine

## 2015-09-07 ENCOUNTER — Encounter: Payer: Self-pay | Admitting: Internal Medicine

## 2015-09-07 VITALS — BP 100/60 | HR 81 | Wt 120.0 lb

## 2015-09-07 DIAGNOSIS — K219 Gastro-esophageal reflux disease without esophagitis: Secondary | ICD-10-CM

## 2015-09-07 DIAGNOSIS — Z5189 Encounter for other specified aftercare: Secondary | ICD-10-CM

## 2015-09-07 DIAGNOSIS — R1084 Generalized abdominal pain: Secondary | ICD-10-CM

## 2015-09-07 DIAGNOSIS — I2602 Saddle embolus of pulmonary artery with acute cor pulmonale: Secondary | ICD-10-CM

## 2015-09-07 DIAGNOSIS — I82431 Acute embolism and thrombosis of right popliteal vein: Secondary | ICD-10-CM

## 2015-09-07 MED ORDER — BENZONATATE 200 MG PO CAPS: 200.0000 mg | ORAL_CAPSULE | Freq: Two times a day (BID) | ORAL | Status: DC | PRN

## 2015-09-07 NOTE — Progress Notes (Signed)
Subjective:  Patient ID: Kristen Castillo, female    DOB: 21-Dec-1940  Age: 75 y.o. MRN: [id number]  CC: No chief complaint on file.   HPI TACHE BOBST presents for RLE DVT/PE post-hospital visit. The pt was hospitalized in the ICU for a pulmonary saddle embolus, s/p lysis w/a catheter. She was anticoagulated.  Feeling better C/o a new R hernia   Outpatient Prescriptions Prior to Visit  Medication Sig Dispense Refill  . apixaban (ELIQUIS) 5 MG TABS tablet Take 2 tablets (10 mg total) by mouth 2 (two) times daily. 24 tablet 0  . budesonide (RHINOCORT ALLERGY) 32 MCG/ACT nasal spray Place 1 spray into both nostrils daily.    . calcium carbonate (OS-CAL) 600 MG TABS tablet Take 600 mg by mouth 2 (two) times daily with a meal. Reported on 08/29/2015    . Cholecalciferol 1000 UNITS tablet Take 2,000 Units by mouth 2 (two) times daily. Reported on 08/29/2015    . ibuprofen (ADVIL,MOTRIN) 200 MG tablet Take 200 mg by mouth 2 (two) times daily.    . Multiple Vitamin (MULTIVITAMIN) tablet Take 1 tablet by mouth daily. Reported on 08/29/2015    . ondansetron (ZOFRAN ODT) 4 MG disintegrating tablet Take 1 tablet (4 mg total) by mouth every 6 (six) hours as needed for nausea or vomiting. 30 tablet 0  . OVER THE COUNTER MEDICATION guiafenesin - sudafed er 619m/60 mg as needed    . pantoprazole (PROTONIX) 40 MG tablet Take 1 tablet twice a day for 2 weeks, then stay on 1 tablet daily. 90 tablet 1  . sennosides-docusate sodium (SENOKOT-S) 8.6-50 MG tablet Take 1 tablet by mouth daily.     No facility-administered medications prior to visit.    ROS Review of Systems  Constitutional: Positive for fatigue. Negative for chills, activity change, appetite change and unexpected weight change.  HENT: Negative for congestion, mouth sores and sinus pressure.   Eyes: Negative for visual disturbance.  Respiratory: Positive for cough and shortness of breath. Negative for chest tightness and wheezing.     Gastrointestinal: Negative for nausea, vomiting and abdominal pain.  Genitourinary: Negative for frequency, difficulty urinating and vaginal pain.  Musculoskeletal: Positive for joint swelling. Negative for back pain and gait problem.  Skin: Negative for pallor and rash.  Neurological: Negative for dizziness, tremors, weakness, numbness and headaches.  Psychiatric/Behavioral: Negative for confusion and sleep disturbance.    Objective:  BP 100/60 mmHg  Pulse 81  Wt 120 lb (54.432 kg)  SpO2 97%  BP Readings from Last 3 Encounters:  09/07/15 100/60  08/29/15 136/82  08/26/15 120/55    Wt Readings from Last 3 Encounters:  09/07/15 120 lb (54.432 kg)  08/29/15 122 lb (55.339 kg)  08/24/15 116 lb 6.5 oz (52.8 kg)    Physical Exam  Constitutional: She appears well-developed. No distress.  HENT:  Head: Normocephalic.  Right Ear: External ear normal.  Left Ear: External ear normal.  Nose: Nose normal.  Mouth/Throat: Oropharynx is clear and moist.  Eyes: Conjunctivae are normal. Pupils are equal, round, and reactive to light. Right eye exhibits no discharge. Left eye exhibits no discharge.  Neck: Normal range of motion. Neck supple. No JVD present. No tracheal deviation present. No thyromegaly present.  Cardiovascular: Normal rate, regular rhythm and normal heart sounds.   Pulmonary/Chest: No stridor. No respiratory distress. She has no wheezes.  Abdominal: Soft. Bowel sounds are normal. She exhibits no distension and no mass. There is no tenderness. There is no rebound  and no guarding.  Musculoskeletal: She exhibits edema. She exhibits no tenderness.  Lymphadenopathy:    She has no cervical adenopathy.  Neurological: She displays normal reflexes. No cranial nerve deficit. She exhibits normal muscle tone. Coordination normal.  Skin: No rash noted. No erythema.  Psychiatric: She has a normal mood and affect. Her behavior is normal. Judgment and thought content normal.  R ankle is  swollen Small R abd wall hernia  Lab Results  Component Value Date   WBC 9.2 08/26/2015   HGB 9.1* 08/26/2015   HCT 29.7* 08/26/2015   PLT 252 08/26/2015   GLUCOSE 140* 08/25/2015   CHOL 186 04/14/2013   TRIG 193* 05/09/2014   HDL 51.90 04/14/2013   LDLCALC 104* 04/14/2013   ALT 11* 06/29/2015   AST 15 06/29/2015   NA 137 08/25/2015   K 4.3 08/25/2015   CL 113* 08/25/2015   CREATININE 1.08* 08/25/2015   BUN 28* 08/25/2015   CO2 19* 08/25/2015   TSH 1.50 01/19/2015   INR 1.54* 08/25/2015   HGBA1C 6.0* 11/03/2014    Ct Angio Chest Pe W/cm &/or Wo Cm  08/23/2015  CLINICAL DATA:  Shortness of breath. Right lower extremity deep venous thrombosis. Recent surgery. EXAM: CT ANGIOGRAPHY CHEST WITH CONTRAST TECHNIQUE: Multidetector CT imaging of the chest was performed using the standard protocol during bolus administration of intravenous contrast. Multiplanar CT image reconstructions and MIPs were obtained to evaluate the vascular anatomy. CONTRAST:  100 cc Isovue 370 IV. COMPARISON:  07/26/2015 chest radiograph.  No prior chest CT. FINDINGS: Mediastinum/Nodes: The study is high quality for the evaluation of pulmonary embolism. There is an acute saddle pulmonary embolus. There is a large burden of acute pulmonary embolism involving lobar, segmental and subsegmental branches of all lung lobes in both lungs. Minimally atherosclerotic nonaneurysmal thoracic aorta. Mildly dilated main pulmonary artery (3.1 cm diameter). Mild cardiomegaly with dilated right ventricle and right atrium (RV-LV ratio 1.7). No pericardial fluid/thickening. Normal visualized thyroid. Normal esophagus. No pathologically enlarged axillary, mediastinal or hilar lymph nodes. Lungs/Pleura: No pneumothorax. No pleural effusion. Wedge-shaped small subpleural focus of ground-glass opacity in the basilar medial right upper lobe (series 7/ image 44). Posterior right upper lobe 3 mm pulmonary nodule (series 7/ image 27). Irregular 1.6  x 0.7 cm focus of consolidation in the posterior left upper lobe (series 7/ image 35). Additional patchy foci of ground-glass opacity in peribronchovascular left upper lobe. Subsegmental atelectasis in both lower lobes. Upper abdomen: Unremarkable. Musculoskeletal: No aggressive appearing focal osseous lesions. Mild degenerative changes in the thoracic spine. Review of the MIP images confirms the above findings. IMPRESSION: 1. Large burden acute pulmonary embolism with saddle pulmonary embolus. Pulmonary emboli involves lobar, segmental and subsegmental branches of all lung lobes in both lungs. 2. Dilated right atrium and right ventricle. Positive for acute PE with CT evidence of right heart strain (RV/LV Ratio = 1.7) consistent with at least submassive (intermediate risk) PE. The presence of right heart strain has been associated with an increased risk of morbidity and mortality. Please activate Code PE by paging [phone]. 3. Wedge-shaped small subpleural focus of ground-glass opacity in the basilar medial right upper lobe, probably a small pulmonary infarct. Additional mild patchy areas of ground-glass opacity in the left upper lobe probably represent areas of mild alveolar hemorrhage. 4. Additional irregular 1.6 cm focus of consolidation in the posterior left upper lobe, which is indeterminate. A follow-up chest CT is recommended in 3 months with attention to this focus. Critical Value/emergent results  were called by telephone at the time of interpretation on 08/23/2015 at 5:25 pm to Dr. Leo Grosser , who verbally acknowledged these results. Electronically Signed   By: Ilona Sorrel M.D.   On: 08/23/2015 17:37   Ir Angiogram Pulmonary Bilateral Selective  08/23/2015  INDICATION: History of sub massive pulmonary embolism and right-sided heart strain. Request made for initiation of bilateral ultrasound assisted pulmonary arterial catheter directed thrombolysis. EXAM: 1. ULTRASOUND GUIDANCE FOR VENOUS ACCESS  X2 2. BILATERAL PULMONARY ARTERIOGRAPHY 3. FLUOROSCOPIC GUIDED PLACEMENT OF BILATERAL PULMONARY ARTERIAL LYTIC INFUSION CATHETERS COMPARISON:  Chest CTA - earlier same day; right lower extremity venous Doppler ultrasound - earlier same day MEDICATIONS: Moderate (conscious) sedation was employed during this procedure. A total of Versed 1.5 mg and Fentanyl 75 mcg was administered intravenously. Moderate Sedation Time: 30 minutes. The patient's level of consciousness and vital signs were monitored continuously by radiology nursing throughout the procedure under my direct supervision. CONTRAST:  20 cc Isovue-300 FLUOROSCOPY TIME:  5 minutes 36 seconds (15 mGy) COMPLICATIONS: None immediate. TECHNIQUE: Informed written consent was obtained from the patient after a discussion of the risks, benefits and alternatives to treatment. Questions regarding the procedure were encouraged and answered. A timeout was performed prior to the initiation of the procedure. Ultrasound scanning was performed of the right groin demonstrated patency of the right common femoral vein. As such, decision was made to perform the bilateral pulmonary arterial catheter thrombolysis from right common femoral vein approach. The right groin was draped in the usual sterile fashion, and a sterile drape was applied covering the operative field. Maximum barrier sterile technique with sterile gowns and gloves were used for the procedure. A timeout was performed prior to the initiation of the procedure. The right femoral head was marked fluoroscopically. The right common femoral vein was accessed with adjacent micro puncture kit under direct ultrasound guidance. Ultrasound images were saved for procedural documentation purposes. This allowed for placement of adjacent 6 French vascular sheaths. With the use of a stiff glidewire, a vertebral catheter was advanced into the right main pulmonary artery and a limited right pulmonary arteriogram was performed with a  hand injection. Pressure measurements were then obtained from the main pulmonary artery. Over a Rosen wire, the vertebral catheter was exchanged for a 105/18 cm multi side-hole EKOS ultrasound assisted infusion catheter. With the use of the stiff glidewire, the vertebral catheter was advanced into the left main pulmonary artery. Over an a Rosen wire, the vertebral catheter was exchanged for a 105/12 cm multi side-hole EKOS ultrasound assisted infusion catheter. A postprocedural fluoroscopic image was obtained of the check demonstrating final catheter positioning. Both vascular sheath were secured at the right neck with interrupted 2-0 Prolene sutures. The external catheter tubing was secured at the right chest and the lytic therapy was initiated. The patient tolerated the procedure well without immediate postprocedural complication. FINDINGS: Acquired pressure measurements: Main pulmonary artery - 55/19; mean - 34 (normal: < 25/10) Following the procedure, both ultrasound assisted infusion catheter tips terminate within the distal aspects of the bilateral lower lobe sub segmental pulmonary arteries. IMPRESSION: 1. Successful fluoroscopic guided initiation of bilateral ultrasound assisted catheter directed pulmonary arterial lysis for sub massive pulmonary embolism and right-sided heart strain. 2. Markedly elevated pressure measurements within the main pulmonary artery compatible with presumed acute pulmonary arterial hypertension. PLAN: - The patient will return to the interventional radiology suite in approximately 12 hours following the initiation of the catheter directed pulmonary arterial lysis for repeat pressure measurements. -  Given presence of occlusive DVT within the right femoral and popliteal veins on right lower extremity venous Doppler ultrasound perform earlier today, the appropriateness of IVC filter placement for temporary caval interruption will be discussed with the providing critical care service  prior to vascular sheath removal. Electronically Signed   By: Sandi Mariscal M.D.   On: 08/23/2015 23:28   Ir Angiogram Selective Each Additional Vessel  08/23/2015  INDICATION: History of sub massive pulmonary embolism and right-sided heart strain. Request made for initiation of bilateral ultrasound assisted pulmonary arterial catheter directed thrombolysis. EXAM: 1. ULTRASOUND GUIDANCE FOR VENOUS ACCESS X2 2. BILATERAL PULMONARY ARTERIOGRAPHY 3. FLUOROSCOPIC GUIDED PLACEMENT OF BILATERAL PULMONARY ARTERIAL LYTIC INFUSION CATHETERS COMPARISON:  Chest CTA - earlier same day; right lower extremity venous Doppler ultrasound - earlier same day MEDICATIONS: Moderate (conscious) sedation was employed during this procedure. A total of Versed 1.5 mg and Fentanyl 75 mcg was administered intravenously. Moderate Sedation Time: 30 minutes. The patient's level of consciousness and vital signs were monitored continuously by radiology nursing throughout the procedure under my direct supervision. CONTRAST:  20 cc Isovue-300 FLUOROSCOPY TIME:  5 minutes 36 seconds (15 mGy) COMPLICATIONS: None immediate. TECHNIQUE: Informed written consent was obtained from the patient after a discussion of the risks, benefits and alternatives to treatment. Questions regarding the procedure were encouraged and answered. A timeout was performed prior to the initiation of the procedure. Ultrasound scanning was performed of the right groin demonstrated patency of the right common femoral vein. As such, decision was made to perform the bilateral pulmonary arterial catheter thrombolysis from right common femoral vein approach. The right groin was draped in the usual sterile fashion, and a sterile drape was applied covering the operative field. Maximum barrier sterile technique with sterile gowns and gloves were used for the procedure. A timeout was performed prior to the initiation of the procedure. The right femoral head was marked fluoroscopically. The  right common femoral vein was accessed with adjacent micro puncture kit under direct ultrasound guidance. Ultrasound images were saved for procedural documentation purposes. This allowed for placement of adjacent 6 French vascular sheaths. With the use of a stiff glidewire, a vertebral catheter was advanced into the right main pulmonary artery and a limited right pulmonary arteriogram was performed with a hand injection. Pressure measurements were then obtained from the main pulmonary artery. Over a Rosen wire, the vertebral catheter was exchanged for a 105/18 cm multi side-hole EKOS ultrasound assisted infusion catheter. With the use of the stiff glidewire, the vertebral catheter was advanced into the left main pulmonary artery. Over an a Rosen wire, the vertebral catheter was exchanged for a 105/12 cm multi side-hole EKOS ultrasound assisted infusion catheter. A postprocedural fluoroscopic image was obtained of the check demonstrating final catheter positioning. Both vascular sheath were secured at the right neck with interrupted 2-0 Prolene sutures. The external catheter tubing was secured at the right chest and the lytic therapy was initiated. The patient tolerated the procedure well without immediate postprocedural complication. FINDINGS: Acquired pressure measurements: Main pulmonary artery - 55/19; mean - 34 (normal: < 25/10) Following the procedure, both ultrasound assisted infusion catheter tips terminate within the distal aspects of the bilateral lower lobe sub segmental pulmonary arteries. IMPRESSION: 1. Successful fluoroscopic guided initiation of bilateral ultrasound assisted catheter directed pulmonary arterial lysis for sub massive pulmonary embolism and right-sided heart strain. 2. Markedly elevated pressure measurements within the main pulmonary artery compatible with presumed acute pulmonary arterial hypertension. PLAN: - The patient will return  to the interventional radiology suite in  approximately 12 hours following the initiation of the catheter directed pulmonary arterial lysis for repeat pressure measurements. - Given presence of occlusive DVT within the right femoral and popliteal veins on right lower extremity venous Doppler ultrasound perform earlier today, the appropriateness of IVC filter placement for temporary caval interruption will be discussed with the providing critical care service prior to vascular sheath removal. Electronically Signed   By: Sandi Mariscal M.D.   On: 08/23/2015 23:28   Ir Angiogram Selective Each Additional Vessel  08/23/2015  INDICATION: History of sub massive pulmonary embolism and right-sided heart strain. Request made for initiation of bilateral ultrasound assisted pulmonary arterial catheter directed thrombolysis. EXAM: 1. ULTRASOUND GUIDANCE FOR VENOUS ACCESS X2 2. BILATERAL PULMONARY ARTERIOGRAPHY 3. FLUOROSCOPIC GUIDED PLACEMENT OF BILATERAL PULMONARY ARTERIAL LYTIC INFUSION CATHETERS COMPARISON:  Chest CTA - earlier same day; right lower extremity venous Doppler ultrasound - earlier same day MEDICATIONS: Moderate (conscious) sedation was employed during this procedure. A total of Versed 1.5 mg and Fentanyl 75 mcg was administered intravenously. Moderate Sedation Time: 30 minutes. The patient's level of consciousness and vital signs were monitored continuously by radiology nursing throughout the procedure under my direct supervision. CONTRAST:  20 cc Isovue-300 FLUOROSCOPY TIME:  5 minutes 36 seconds (15 mGy) COMPLICATIONS: None immediate. TECHNIQUE: Informed written consent was obtained from the patient after a discussion of the risks, benefits and alternatives to treatment. Questions regarding the procedure were encouraged and answered. A timeout was performed prior to the initiation of the procedure. Ultrasound scanning was performed of the right groin demonstrated patency of the right common femoral vein. As such, decision was made to perform the  bilateral pulmonary arterial catheter thrombolysis from right common femoral vein approach. The right groin was draped in the usual sterile fashion, and a sterile drape was applied covering the operative field. Maximum barrier sterile technique with sterile gowns and gloves were used for the procedure. A timeout was performed prior to the initiation of the procedure. The right femoral head was marked fluoroscopically. The right common femoral vein was accessed with adjacent micro puncture kit under direct ultrasound guidance. Ultrasound images were saved for procedural documentation purposes. This allowed for placement of adjacent 6 French vascular sheaths. With the use of a stiff glidewire, a vertebral catheter was advanced into the right main pulmonary artery and a limited right pulmonary arteriogram was performed with a hand injection. Pressure measurements were then obtained from the main pulmonary artery. Over a Rosen wire, the vertebral catheter was exchanged for a 105/18 cm multi side-hole EKOS ultrasound assisted infusion catheter. With the use of the stiff glidewire, the vertebral catheter was advanced into the left main pulmonary artery. Over an a Rosen wire, the vertebral catheter was exchanged for a 105/12 cm multi side-hole EKOS ultrasound assisted infusion catheter. A postprocedural fluoroscopic image was obtained of the check demonstrating final catheter positioning. Both vascular sheath were secured at the right neck with interrupted 2-0 Prolene sutures. The external catheter tubing was secured at the right chest and the lytic therapy was initiated. The patient tolerated the procedure well without immediate postprocedural complication. FINDINGS: Acquired pressure measurements: Main pulmonary artery - 55/19; mean - 34 (normal: < 25/10) Following the procedure, both ultrasound assisted infusion catheter tips terminate within the distal aspects of the bilateral lower lobe sub segmental pulmonary  arteries. IMPRESSION: 1. Successful fluoroscopic guided initiation of bilateral ultrasound assisted catheter directed pulmonary arterial lysis for sub massive pulmonary embolism and right-sided  heart strain. 2. Markedly elevated pressure measurements within the main pulmonary artery compatible with presumed acute pulmonary arterial hypertension. PLAN: - The patient will return to the interventional radiology suite in approximately 12 hours following the initiation of the catheter directed pulmonary arterial lysis for repeat pressure measurements. - Given presence of occlusive DVT within the right femoral and popliteal veins on right lower extremity venous Doppler ultrasound perform earlier today, the appropriateness of IVC filter placement for temporary caval interruption will be discussed with the providing critical care service prior to vascular sheath removal. Electronically Signed   By: Simonne Come M.D.   On: 08/23/2015 23:28   US Venous Img Lower Unilateral Right  08/23/2015  CLINICAL DATA:  Right lower extremity pain and swelling. Evaluate for DVT. EXAM: Right LOWER EXTREMITY VENOUS DOPPLER ULTRASOUND TECHNIQUE: Gray-scale sonography with graded compression, as well as color Doppler and duplex ultrasound were performed to evaluate the lower extremity deep venous systems from the level of the common femoral vein and including the common femoral, femoral, profunda femoral, popliteal and calf veins including the posterior tibial, peroneal and gastrocnemius veins when visible. The superficial great saphenous vein was also interrogated. Spectral Doppler was utilized to evaluate flow at rest and with distal augmentation maneuvers in the common femoral, femoral and popliteal veins. COMPARISON:  None. FINDINGS: Expanded veins with internal echoes affecting the distal right femoral vein, popliteal vein, and posterior tibial/peroneal calf veins consistent with occlusive thrombus. No internal color Doppler flow. Above  the clot deep veins are patent with symmetric respiratory phasicity. These results will be called to the ordering clinician or representative by the Radiology Department at the imaging location prior to patient disposition. IMPRESSION: Positive for occlusive deep venous thrombosis from the distal right femoral vein into the calf. Electronically Signed   By: Marnee Spring M.D.   On: 08/23/2015 13:53   Ir US Guide Vasc Access Right  08/23/2015  INDICATION: History of sub massive pulmonary embolism and right-sided heart strain. Request made for initiation of bilateral ultrasound assisted pulmonary arterial catheter directed thrombolysis. EXAM: 1. ULTRASOUND GUIDANCE FOR VENOUS ACCESS X2 2. BILATERAL PULMONARY ARTERIOGRAPHY 3. FLUOROSCOPIC GUIDED PLACEMENT OF BILATERAL PULMONARY ARTERIAL LYTIC INFUSION CATHETERS COMPARISON:  Chest CTA - earlier same day; right lower extremity venous Doppler ultrasound - earlier same day MEDICATIONS: Moderate (conscious) sedation was employed during this procedure. A total of Versed 1.5 mg and Fentanyl 75 mcg was administered intravenously. Moderate Sedation Time: 30 minutes. The patient's level of consciousness and vital signs were monitored continuously by radiology nursing throughout the procedure under my direct supervision. CONTRAST:  20 cc Isovue-300 FLUOROSCOPY TIME:  5 minutes 36 seconds (15 mGy) COMPLICATIONS: None immediate. TECHNIQUE: Informed written consent was obtained from the patient after a discussion of the risks, benefits and alternatives to treatment. Questions regarding the procedure were encouraged and answered. A timeout was performed prior to the initiation of the procedure. Ultrasound scanning was performed of the right groin demonstrated patency of the right common femoral vein. As such, decision was made to perform the bilateral pulmonary arterial catheter thrombolysis from right common femoral vein approach. The right groin was draped in the usual sterile  fashion, and a sterile drape was applied covering the operative field. Maximum barrier sterile technique with sterile gowns and gloves were used for the procedure. A timeout was performed prior to the initiation of the procedure. The right femoral head was marked fluoroscopically. The right common femoral vein was accessed with adjacent micro puncture kit under  direct ultrasound guidance. Ultrasound images were saved for procedural documentation purposes. This allowed for placement of adjacent 6 French vascular sheaths. With the use of a stiff glidewire, a vertebral catheter was advanced into the right main pulmonary artery and a limited right pulmonary arteriogram was performed with a hand injection. Pressure measurements were then obtained from the main pulmonary artery. Over a Rosen wire, the vertebral catheter was exchanged for a 105/18 cm multi side-hole EKOS ultrasound assisted infusion catheter. With the use of the stiff glidewire, the vertebral catheter was advanced into the left main pulmonary artery. Over an a Rosen wire, the vertebral catheter was exchanged for a 105/12 cm multi side-hole EKOS ultrasound assisted infusion catheter. A postprocedural fluoroscopic image was obtained of the check demonstrating final catheter positioning. Both vascular sheath were secured at the right neck with interrupted 2-0 Prolene sutures. The external catheter tubing was secured at the right chest and the lytic therapy was initiated. The patient tolerated the procedure well without immediate postprocedural complication. FINDINGS: Acquired pressure measurements: Main pulmonary artery - 55/19; mean - 34 (normal: < 25/10) Following the procedure, both ultrasound assisted infusion catheter tips terminate within the distal aspects of the bilateral lower lobe sub segmental pulmonary arteries. IMPRESSION: 1. Successful fluoroscopic guided initiation of bilateral ultrasound assisted catheter directed pulmonary arterial lysis for  sub massive pulmonary embolism and right-sided heart strain. 2. Markedly elevated pressure measurements within the main pulmonary artery compatible with presumed acute pulmonary arterial hypertension. PLAN: - The patient will return to the interventional radiology suite in approximately 12 hours following the initiation of the catheter directed pulmonary arterial lysis for repeat pressure measurements. - Given presence of occlusive DVT within the right femoral and popliteal veins on right lower extremity venous Doppler ultrasound perform earlier today, the appropriateness of IVC filter placement for temporary caval interruption will be discussed with the providing critical care service prior to vascular sheath removal. Electronically Signed   By: Sandi Mariscal M.D.   On: 08/23/2015 23:28   Ir Infusion Thrombol Arterial Initial (ms)  08/23/2015  INDICATION: History of sub massive pulmonary embolism and right-sided heart strain. Request made for initiation of bilateral ultrasound assisted pulmonary arterial catheter directed thrombolysis. EXAM: 1. ULTRASOUND GUIDANCE FOR VENOUS ACCESS X2 2. BILATERAL PULMONARY ARTERIOGRAPHY 3. FLUOROSCOPIC GUIDED PLACEMENT OF BILATERAL PULMONARY ARTERIAL LYTIC INFUSION CATHETERS COMPARISON:  Chest CTA - earlier same day; right lower extremity venous Doppler ultrasound - earlier same day MEDICATIONS: Moderate (conscious) sedation was employed during this procedure. A total of Versed 1.5 mg and Fentanyl 75 mcg was administered intravenously. Moderate Sedation Time: 30 minutes. The patient's level of consciousness and vital signs were monitored continuously by radiology nursing throughout the procedure under my direct supervision. CONTRAST:  20 cc Isovue-300 FLUOROSCOPY TIME:  5 minutes 36 seconds (15 mGy) COMPLICATIONS: None immediate. TECHNIQUE: Informed written consent was obtained from the patient after a discussion of the risks, benefits and alternatives to treatment. Questions  regarding the procedure were encouraged and answered. A timeout was performed prior to the initiation of the procedure. Ultrasound scanning was performed of the right groin demonstrated patency of the right common femoral vein. As such, decision was made to perform the bilateral pulmonary arterial catheter thrombolysis from right common femoral vein approach. The right groin was draped in the usual sterile fashion, and a sterile drape was applied covering the operative field. Maximum barrier sterile technique with sterile gowns and gloves were used for the procedure. A timeout was performed prior to the  initiation of the procedure. The right femoral head was marked fluoroscopically. The right common femoral vein was accessed with adjacent micro puncture kit under direct ultrasound guidance. Ultrasound images were saved for procedural documentation purposes. This allowed for placement of adjacent 6 French vascular sheaths. With the use of a stiff glidewire, a vertebral catheter was advanced into the right main pulmonary artery and a limited right pulmonary arteriogram was performed with a hand injection. Pressure measurements were then obtained from the main pulmonary artery. Over a Rosen wire, the vertebral catheter was exchanged for a 105/18 cm multi side-hole EKOS ultrasound assisted infusion catheter. With the use of the stiff glidewire, the vertebral catheter was advanced into the left main pulmonary artery. Over an a Rosen wire, the vertebral catheter was exchanged for a 105/12 cm multi side-hole EKOS ultrasound assisted infusion catheter. A postprocedural fluoroscopic image was obtained of the check demonstrating final catheter positioning. Both vascular sheath were secured at the right neck with interrupted 2-0 Prolene sutures. The external catheter tubing was secured at the right chest and the lytic therapy was initiated. The patient tolerated the procedure well without immediate postprocedural complication.  FINDINGS: Acquired pressure measurements: Main pulmonary artery - 55/19; mean - 34 (normal: < 25/10) Following the procedure, both ultrasound assisted infusion catheter tips terminate within the distal aspects of the bilateral lower lobe sub segmental pulmonary arteries. IMPRESSION: 1. Successful fluoroscopic guided initiation of bilateral ultrasound assisted catheter directed pulmonary arterial lysis for sub massive pulmonary embolism and right-sided heart strain. 2. Markedly elevated pressure measurements within the main pulmonary artery compatible with presumed acute pulmonary arterial hypertension. PLAN: - The patient will return to the interventional radiology suite in approximately 12 hours following the initiation of the catheter directed pulmonary arterial lysis for repeat pressure measurements. - Given presence of occlusive DVT within the right femoral and popliteal veins on right lower extremity venous Doppler ultrasound perform earlier today, the appropriateness of IVC filter placement for temporary caval interruption will be discussed with the providing critical care service prior to vascular sheath removal. Electronically Signed   By: Sandi Mariscal M.D.   On: 08/23/2015 23:28   Ir Infusion Thrombol Arterial Initial (ms)  08/23/2015  INDICATION: History of sub massive pulmonary embolism and right-sided heart strain. Request made for initiation of bilateral ultrasound assisted pulmonary arterial catheter directed thrombolysis. EXAM: 1. ULTRASOUND GUIDANCE FOR VENOUS ACCESS X2 2. BILATERAL PULMONARY ARTERIOGRAPHY 3. FLUOROSCOPIC GUIDED PLACEMENT OF BILATERAL PULMONARY ARTERIAL LYTIC INFUSION CATHETERS COMPARISON:  Chest CTA - earlier same day; right lower extremity venous Doppler ultrasound - earlier same day MEDICATIONS: Moderate (conscious) sedation was employed during this procedure. A total of Versed 1.5 mg and Fentanyl 75 mcg was administered intravenously. Moderate Sedation Time: 30 minutes. The  patient's level of consciousness and vital signs were monitored continuously by radiology nursing throughout the procedure under my direct supervision. CONTRAST:  20 cc Isovue-300 FLUOROSCOPY TIME:  5 minutes 36 seconds (15 mGy) COMPLICATIONS: None immediate. TECHNIQUE: Informed written consent was obtained from the patient after a discussion of the risks, benefits and alternatives to treatment. Questions regarding the procedure were encouraged and answered. A timeout was performed prior to the initiation of the procedure. Ultrasound scanning was performed of the right groin demonstrated patency of the right common femoral vein. As such, decision was made to perform the bilateral pulmonary arterial catheter thrombolysis from right common femoral vein approach. The right groin was draped in the usual sterile fashion, and a sterile drape was applied covering  the operative field. Maximum barrier sterile technique with sterile gowns and gloves were used for the procedure. A timeout was performed prior to the initiation of the procedure. The right femoral head was marked fluoroscopically. The right common femoral vein was accessed with adjacent micro puncture kit under direct ultrasound guidance. Ultrasound images were saved for procedural documentation purposes. This allowed for placement of adjacent 6 French vascular sheaths. With the use of a stiff glidewire, a vertebral catheter was advanced into the right main pulmonary artery and a limited right pulmonary arteriogram was performed with a hand injection. Pressure measurements were then obtained from the main pulmonary artery. Over a Rosen wire, the vertebral catheter was exchanged for a 105/18 cm multi side-hole EKOS ultrasound assisted infusion catheter. With the use of the stiff glidewire, the vertebral catheter was advanced into the left main pulmonary artery. Over an a Rosen wire, the vertebral catheter was exchanged for a 105/12 cm multi side-hole EKOS  ultrasound assisted infusion catheter. A postprocedural fluoroscopic image was obtained of the check demonstrating final catheter positioning. Both vascular sheath were secured at the right neck with interrupted 2-0 Prolene sutures. The external catheter tubing was secured at the right chest and the lytic therapy was initiated. The patient tolerated the procedure well without immediate postprocedural complication. FINDINGS: Acquired pressure measurements: Main pulmonary artery - 55/19; mean - 34 (normal: < 25/10) Following the procedure, both ultrasound assisted infusion catheter tips terminate within the distal aspects of the bilateral lower lobe sub segmental pulmonary arteries. IMPRESSION: 1. Successful fluoroscopic guided initiation of bilateral ultrasound assisted catheter directed pulmonary arterial lysis for sub massive pulmonary embolism and right-sided heart strain. 2. Markedly elevated pressure measurements within the main pulmonary artery compatible with presumed acute pulmonary arterial hypertension. PLAN: - The patient will return to the interventional radiology suite in approximately 12 hours following the initiation of the catheter directed pulmonary arterial lysis for repeat pressure measurements. - Given presence of occlusive DVT within the right femoral and popliteal veins on right lower extremity venous Doppler ultrasound perform earlier today, the appropriateness of IVC filter placement for temporary caval interruption will be discussed with the providing critical care service prior to vascular sheath removal. Electronically Signed   By: Sandi Mariscal M.D.   On: 08/23/2015 23:28   Ir Jacolyn Reedy F/u Eval Art/ven Final Day (ms)  08/24/2015  INDICATION: 12 hours post bilateral pulmonary artery EKOS lysis. The patient feels much better, with improved dyspnea. EXAM: IR THROMB F/U EVAL ART/VEN FINAL DAY COMPARISON:  None. MEDICATIONS: None. ANESTHESIA/SEDATION: None FLUOROSCOPY TIME:  Fluoroscopy Time:   minutes 6 seconds ( mGy). COMPLICATIONS: None immediate. TECHNIQUE: Fluoroscopy confirms stable position of the bilateral pulmonary artery catheters. The right pulmonary artery 1 catheter was retracted into the main pulmonary artery and a pressure was obtained. Post lysed cysts pressure was measured at 55/19 mm Hg with a mean of 32 mm Hg. The pulmonary artery catheters and right groin sheaths were removed. Hemostasis was achieved with direct pressure. FINDINGS: Imaging confirms stable position of the bilateral pulmonary artery catheters. IMPRESSION: After 12 hours of lysis, pulmonary artery pressures remained stable. Final mean pressure of 32 mm Hg was determined. Electronically Signed   By: Marybelle Killings M.D.   On: 08/24/2015 16:00    Assessment & Plan:   There are no diagnoses linked to this encounter. I am having Ms. [name] maintain her Cholecalciferol, calcium carbonate, multivitamin, ondansetron, OVER THE COUNTER MEDICATION, budesonide, ibuprofen, sennosides-docusate sodium, apixaban, and pantoprazole.  No orders  of the defined types were placed in this encounter.     Follow-up: Return in about 4 months (around 01/07/2016).  Walker Kehr, MD

## 2015-09-07 NOTE — Progress Notes (Signed)
Pre visit review using our clinic review tool, if applicable. No additional management support is needed unless otherwise documented below in the visit note. 

## 2015-09-07 NOTE — Patient Instructions (Signed)
GERD wedge/pillow Leg wedge

## 2015-09-07 NOTE — Assessment & Plan Note (Signed)
Leg wedge

## 2015-09-09 ENCOUNTER — Encounter: Payer: Self-pay | Admitting: Internal Medicine

## 2015-09-09 NOTE — Assessment & Plan Note (Signed)
6/17 severe PE, s/p lysis w/a catheter On Eliquis

## 2015-09-09 NOTE — Assessment & Plan Note (Addendum)
Protonix GERD wedge pillow

## 2015-09-09 NOTE — Assessment & Plan Note (Signed)
Hernia related S/p mesh repair

## 2015-09-11 ENCOUNTER — Encounter: Payer: Self-pay | Admitting: Emergency Medicine

## 2015-09-11 NOTE — Telephone Encounter (Signed)
Patient's husband notified of Dr. Gustavus Bryant recommendations. Nothing further needed.

## 2015-09-11 NOTE — Telephone Encounter (Signed)
Patients husband sent message via mychart because patient is very concerned about getting another blood clot.  Patient is still taking Eliquis.  She still has some swelling in her legs but the swelling is the same, no change.  She is having some tingling in her ankle and she just wanted reassurance from a provider that this is not another blood clot forming.  Husband says that since her last blood clot she has been paranoid that she will get another one and is very nervous.  She says that she is not having any chest discomfort or breathing issues.  Dr. Melvyn Novas, please advise in Dr. Agustina Caroli absence.   Patient Instructions     We will increase your protonix to 40mg  twice a day for 2 weeks, then go back to once a day until our next visit.  Please continue your rhinocort, loratadine, delsym as needed, tessalon perles.  Please continue your Eliquis as directed.  Follow with Dr Lamonte Sakai in August as planned.

## 2015-09-12 ENCOUNTER — Encounter: Payer: Self-pay | Admitting: Emergency Medicine

## 2015-09-13 MED ORDER — PANTOPRAZOLE SODIUM 40 MG PO TBEC: 40.0000 mg | DELAYED_RELEASE_TABLET | Freq: Every day | ORAL | Status: DC

## 2015-10-17 ENCOUNTER — Encounter: Payer: Self-pay | Admitting: Emergency Medicine

## 2015-10-17 ENCOUNTER — Ambulatory Visit (INDEPENDENT_AMBULATORY_CARE_PROVIDER_SITE_OTHER): Payer: Medicare Other | Admitting: Emergency Medicine

## 2015-10-17 DIAGNOSIS — I2602 Saddle embolus of pulmonary artery with acute cor pulmonale: Secondary | ICD-10-CM

## 2015-10-17 DIAGNOSIS — R059 Cough, unspecified: Secondary | ICD-10-CM

## 2015-10-17 DIAGNOSIS — R05 Cough: Secondary | ICD-10-CM | POA: Diagnosis not present

## 2015-10-17 NOTE — Assessment & Plan Note (Signed)
Doing well on Eliquis following large pulmonary embolism. Exercise tolerance is improved. We will continue through mid December and then plan to stop. She will have a hypercoagulable panel after she's been off medication for a month.

## 2015-10-17 NOTE — Patient Instructions (Signed)
Please continue your Eliquis as you have been taking it. We will continue through December and then stop, discuss any further evaluation at that time Try stopping your Protonix to see if you miss it. If you develop reflux or cough then you probably need to restart.  Follow with Dr Lamonte Sakai in mid-December to discuss stopping the eliquis.

## 2015-10-17 NOTE — Assessment & Plan Note (Signed)
Improved since we increased her Protonix temporarily. Since she's better and not experience any GERD symptoms we will try stopping the Protonix altogether. She knows that if she has recurrent cough then we will restart and then discuss with Dr Hilarie Fredrickson. She is concerned about staying on a PPI due to the association with osteoporosis.

## 2015-10-17 NOTE — Progress Notes (Signed)
Subjective:    Patient ID: Kristen Castillo, female    DOB: 08/02/1940, 75 y.o.   MRN: [id number]  HPI 75 year old never smoker known to me from recent hospitalization. 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 She had several abdominal hernias and required mesh placement in May 2017. I met her in June 2017 after she was admitted for a large right lower extremity DVT and associated saddle pulmonary embolism. She underwent targeted lytic therapy and was admitted to the ICU. She recovered well without any evidence of hypoxemia. Her echocardiogram initially showed evidence of right heart strain. She was discharged on Eliquis. She has been having cough since discharge home. Clear mucous. She has been on loratadine, rhinocort, restarted delsym and tessalon perles for a few months. She is on protonix qd.   ROV 10/17/15 -- This a follow-up visit for history of saddle pulmonary embolism that required targeted lysis in the ICU. She was discharged on Eliquis. She also has a history of chronic cough that appears to be associated with chronic rhinitis and GERD. At her last visit we increased her Protonix temporarily to see if she would benefit, continued her allergy regimen. Her cough is now better.  She remains on eliquis.    Review of Systems As per HPI   Past Medical History:  Diagnosis Date  . Anxiety   . Cancer ([place])    hx skin cancer  . Complication of anesthesia   . Congestion of both ears   . Deviated nasal septum   . Diverticulitis   . GERD (gastroesophageal reflux disease)   . Hair loss   . Hematuria    in past  . History of creation of ostomy    past partial colectomy in 05/03/14  . History of skin cancer   . Kidney cysts   . Migraines    in past  . Osteoarthritis    fingers   . Osteoporosis   . Osteoporosis   . Persistent cough   . Pneumonia    july 2016, pneumonia-06/2015   . PONV (postoperative  nausea and vomiting)   . Unspecified vitamin D deficiency   . Ventral hernia      Family History  Problem Relation Age of Onset  . Arthritis Mother   . Colon cancer Mother 44  . Heart disease Father   . Dementia Father   . Diabetes Sister   . Cancer Maternal Grandfather     Lung cancer  . Stomach cancer Neg Hx      Social History   Social History  . Marital status: Married    Spouse name: N/A  . Number of children: N/A  . Years of education: N/A   Occupational History  . Not on file.   Social History Main Topics  . Smoking status: Never Smoker  . Smokeless tobacco: Never Used  . Alcohol use No  . Drug use: No  . Sexual activity: Not on file   Other Topics Concern  . Not on file   [place]- education   Married '64   Work: Pharmacist, hospital, [employer] of [employer] in [place], retired   2 adopted sons '69, '71   Marriage in good health- Life is good     Allergies  Allergen Reactions  . Codeine Nausea And Vomiting  . Dilaudid [Hydromorphone Hcl] Other (See Comments)    "too strong - I  can't wake up"  . Sulfonamide Derivatives Other (See Comments)    Childhood allergy; reaction unknown  . Tape Other (See Comments)    REDDNESS, BLISTER/  Use paper tape  . Norco [Hydrocodone-Acetaminophen] Rash     Outpatient Medications Prior to Visit  Medication Sig Dispense Refill  . apixaban (ELIQUIS) 5 MG TABS tablet Take 2 tablets (10 mg total) by mouth 2 (two) times daily. 24 tablet 0  . budesonide (RHINOCORT ALLERGY) 32 MCG/ACT nasal spray Place 1 spray into both nostrils daily.    . calcium carbonate (OS-CAL) 600 MG TABS tablet Take 600 mg by mouth 2 (two) times daily with a meal. Reported on 08/29/2015    . Cholecalciferol 1000 UNITS tablet Take 2,000 Units by mouth 2 (two) times daily. Reported on 08/29/2015    . Multiple Vitamin (MULTIVITAMIN) tablet Take 1 tablet by mouth daily. Reported on 08/29/2015    . OVER THE COUNTER  MEDICATION guiafenesin - sudafed er 626m/60 mg as needed    . pantoprazole (PROTONIX) 40 MG tablet Take 1 tablet (40 mg total) by mouth daily. 30 tablet 5  . benzonatate (TESSALON) 200 MG capsule Take 1 capsule (200 mg total) by mouth 2 (two) times daily as needed for cough. 60 capsule 1  . ondansetron (ZOFRAN ODT) 4 MG disintegrating tablet Take 1 tablet (4 mg total) by mouth every 6 (six) hours as needed for nausea or vomiting. 30 tablet 0  . sennosides-docusate sodium (SENOKOT-S) 8.6-50 MG tablet Take 1 tablet by mouth daily.     No facility-administered medications prior to visit.         Objective:   Physical Exam Vitals:   10/17/15 1100  BP: 104/68  Pulse: 89  SpO2: 96%  Weight: 121 lb (54.9 kg)  Height: 5' 2"  (1.575 m)    Gen: Pleasant, well-nourished, in no distress,  normal affect  ENT: No lesions,  mouth clear,  oropharynx clear, no postnasal drip  Neck: No JVD, no TMG, no carotid bruits  Lungs: No use of accessory muscles, small breaths, no wheeze.   Cardiovascular: RRR, heart sounds normal, no murmur or gallops, R > L LE edema  Abdomen: she has a binder in place  Musculoskeletal: No deformities, no cyanosis or clubbing  Neuro: alert, non focal  Skin: Warm, no lesions or rashes      Assessment & Plan:  Pulmonary embolism (HCC) Doing well on Eliquis following large pulmonary embolism. Exercise tolerance is improved. We will continue through mid December and then plan to stop. She will have a hypercoagulable panel after she's been off medication for a month.  Cough Improved since we increased her Protonix temporarily. Since she's better and not experience any GERD symptoms we will try stopping the Protonix altogether. She knows that if she has recurrent cough then we will restart and then discuss with Dr PHilarie Fredrickson She is concerned about staying on a PPI due to the association with osteoporosis.  RBaltazar Apo MD, PhD 10/17/2015, 11:40 AM [facility] [phone] if no answer [phone]

## 2015-10-21 ENCOUNTER — Other Ambulatory Visit: Payer: Self-pay | Admitting: Internal Medicine

## 2015-10-23 ENCOUNTER — Other Ambulatory Visit: Payer: Self-pay | Admitting: Internal Medicine

## 2015-10-25 ENCOUNTER — Encounter (HOSPITAL_COMMUNITY): Payer: Self-pay

## 2015-10-27 ENCOUNTER — Other Ambulatory Visit: Payer: Self-pay | Admitting: Internal Medicine

## 2015-10-27 NOTE — Telephone Encounter (Signed)
Phoned into [facility].

## 2015-11-02 DIAGNOSIS — H40832 Aqueous misdirection, left eye: Secondary | ICD-10-CM | POA: Diagnosis not present

## 2015-11-02 DIAGNOSIS — H40031 Anatomical narrow angle, right eye: Secondary | ICD-10-CM | POA: Diagnosis not present

## 2015-11-02 DIAGNOSIS — Z961 Presence of intraocular lens: Secondary | ICD-10-CM | POA: Diagnosis not present

## 2015-11-07 ENCOUNTER — Ambulatory Visit (INDEPENDENT_AMBULATORY_CARE_PROVIDER_SITE_OTHER): Payer: Medicare Other | Admitting: Internal Medicine

## 2015-11-07 ENCOUNTER — Encounter: Payer: Self-pay | Admitting: Internal Medicine

## 2015-11-07 DIAGNOSIS — E559 Vitamin D deficiency, unspecified: Secondary | ICD-10-CM

## 2015-11-07 DIAGNOSIS — R0989 Other specified symptoms and signs involving the circulatory and respiratory systems: Secondary | ICD-10-CM

## 2015-11-07 DIAGNOSIS — I2602 Saddle embolus of pulmonary artery with acute cor pulmonale: Secondary | ICD-10-CM

## 2015-11-07 DIAGNOSIS — I82401 Acute embolism and thrombosis of unspecified deep veins of right lower extremity: Secondary | ICD-10-CM | POA: Diagnosis not present

## 2015-11-07 DIAGNOSIS — J029 Acute pharyngitis, unspecified: Secondary | ICD-10-CM

## 2015-11-07 NOTE — Assessment & Plan Note (Signed)
Doppler US 

## 2015-11-07 NOTE — Assessment & Plan Note (Signed)
Eliquis 

## 2015-11-07 NOTE — Assessment & Plan Note (Signed)
Vit D 

## 2015-11-07 NOTE — Progress Notes (Signed)
Subjective:  Patient ID: Kristen Castillo, female    DOB: 07/10/1940  Age: 75 y.o. MRN: [id number]  CC: No chief complaint on file.   HPI Kristen Castillo presents for DVT/PE, anxiety, GERD f/u  Outpatient Medications Prior to Visit  Medication Sig Dispense Refill  . ALPRAZolam (XANAX) 0.25 MG tablet TAKE (1) TABLET TWICE DAILY AS NEEDED FOR ANXIETY. 30 tablet 3  . apixaban (ELIQUIS) 5 MG TABS tablet Take 2 tablets (10 mg total) by mouth 2 (two) times daily. 24 tablet 0  . budesonide (RHINOCORT ALLERGY) 32 MCG/ACT nasal spray Place 1 spray into both nostrils daily.    . calcium carbonate (OS-CAL) 600 MG TABS tablet Take 600 mg by mouth 2 (two) times daily with a meal. Reported on 08/29/2015    . Cholecalciferol 1000 UNITS tablet Take 2,000 Units by mouth 2 (two) times daily. Reported on 08/29/2015    . Multiple Vitamin (MULTIVITAMIN) tablet Take 1 tablet by mouth daily. Reported on 08/29/2015    . OVER THE COUNTER MEDICATION guiafenesin - sudafed er 647m/60 mg as needed    . pantoprazole (PROTONIX) 40 MG tablet Take 1 tablet (40 mg total) by mouth daily. (Patient not taking: Reported on 11/07/2015) 30 tablet 5   No facility-administered medications prior to visit.     ROS Review of Systems  Constitutional: Positive for fatigue. Negative for activity change, appetite change, chills and unexpected weight change.  HENT: Negative for congestion, mouth sores and sinus pressure.   Eyes: Negative for visual disturbance.  Respiratory: Negative for cough and chest tightness.   Gastrointestinal: Positive for abdominal distention. Negative for abdominal pain and nausea.  Genitourinary: Negative for difficulty urinating, frequency and vaginal pain.  Musculoskeletal: Negative for back pain and gait problem.  Skin: Negative for pallor and rash.  Neurological: Negative for dizziness, tremors, weakness, numbness and headaches.  Psychiatric/Behavioral: Negative for confusion and sleep disturbance. The  patient is nervous/anxious.     Objective:  BP 120/80   Pulse 87   Temp 97.5 F (36.4 C) (Oral)   Wt 119 lb (54 kg)   SpO2 97%   BMI 21.77 kg/m   BP Readings from Last 3 Encounters:  11/07/15 120/80  10/17/15 104/68  09/07/15 100/60    Wt Readings from Last 3 Encounters:  11/07/15 119 lb (54 kg)  10/17/15 121 lb (54.9 kg)  09/07/15 120 lb (54.4 kg)    Physical Exam  Constitutional: She appears well-developed. No distress.  HENT:  Head: Normocephalic.  Right Ear: External ear normal.  Left Ear: External ear normal.  Nose: Nose normal.  Mouth/Throat: Oropharynx is clear and moist.  Eyes: Conjunctivae are normal. Pupils are equal, round, and reactive to light. Right eye exhibits no discharge. Left eye exhibits no discharge.  Neck: Normal range of motion. Neck supple. No JVD present. No tracheal deviation present. No thyromegaly present.  Cardiovascular: Normal rate, regular rhythm and normal heart sounds.   Pulmonary/Chest: No stridor. No respiratory distress. She has no wheezes.  Abdominal: Soft. Bowel sounds are normal. She exhibits distension. She exhibits no mass. There is no tenderness. There is no rebound and no guarding.  Musculoskeletal: She exhibits no edema or tenderness.  Lymphadenopathy:    She has no cervical adenopathy.  Neurological: She displays normal reflexes. No cranial nerve deficit. She exhibits normal muscle tone. Coordination normal.  Skin: No rash noted. No erythema.  Psychiatric: She has a normal mood and affect. Her behavior is normal. Judgment and thought content  normal.  Abd gird bandage  Lab Results  Component Value Date   WBC 9.2 08/26/2015   HGB 9.1 (L) 08/26/2015   HCT 29.7 (L) 08/26/2015   PLT 252 08/26/2015   GLUCOSE 140 (H) 08/25/2015   CHOL 186 04/14/2013   TRIG 193 (H) 05/09/2014   HDL 51.90 04/14/2013   LDLCALC 104 (H) 04/14/2013   ALT 11 (L) 06/29/2015   AST 15 06/29/2015   NA 137 08/25/2015   K 4.3 08/25/2015   CL 113  (H) 08/25/2015   CREATININE 1.08 (H) 08/25/2015   BUN 28 (H) 08/25/2015   CO2 19 (L) 08/25/2015   TSH 1.50 01/19/2015   INR 1.54 (H) 08/25/2015   HGBA1C 6.0 (H) 11/03/2014    Ct Angio Chest Pe W/cm &/or Wo Cm  Result Date: 08/23/2015 CLINICAL DATA:  Shortness of breath. Right lower extremity deep venous thrombosis. Recent surgery. EXAM: CT ANGIOGRAPHY CHEST WITH CONTRAST TECHNIQUE: Multidetector CT imaging of the chest was performed using the standard protocol during bolus administration of intravenous contrast. Multiplanar CT image reconstructions and MIPs were obtained to evaluate the vascular anatomy. CONTRAST:  100 cc Isovue 370 IV. COMPARISON:  07/26/2015 chest radiograph.  No prior chest CT. FINDINGS: Mediastinum/Nodes: The study is high quality for the evaluation of pulmonary embolism. There is an acute saddle pulmonary embolus. There is a large burden of acute pulmonary embolism involving lobar, segmental and subsegmental branches of all lung lobes in both lungs. Minimally atherosclerotic nonaneurysmal thoracic aorta. Mildly dilated main pulmonary artery (3.1 cm diameter). Mild cardiomegaly with dilated right ventricle and right atrium (RV-LV ratio 1.7). No pericardial fluid/thickening. Normal visualized thyroid. Normal esophagus. No pathologically enlarged axillary, mediastinal or hilar lymph nodes. Lungs/Pleura: No pneumothorax. No pleural effusion. Wedge-shaped small subpleural focus of ground-glass opacity in the basilar medial right upper lobe (series 7/ image 44). Posterior right upper lobe 3 mm pulmonary nodule (series 7/ image 27). Irregular 1.6 x 0.7 cm focus of consolidation in the posterior left upper lobe (series 7/ image 35). Additional patchy foci of ground-glass opacity in peribronchovascular left upper lobe. Subsegmental atelectasis in both lower lobes. Upper abdomen: Unremarkable. Musculoskeletal: No aggressive appearing focal osseous lesions. Mild degenerative changes in the  thoracic spine. Review of the MIP images confirms the above findings. IMPRESSION: 1. Large burden acute pulmonary embolism with saddle pulmonary embolus. Pulmonary emboli involves lobar, segmental and subsegmental branches of all lung lobes in both lungs. 2. Dilated right atrium and right ventricle. Positive for acute PE with CT evidence of right heart strain (RV/LV Ratio = 1.7) consistent with at least submassive (intermediate risk) PE. The presence of right heart strain has been associated with an increased risk of morbidity and mortality. Please activate Code PE by paging [phone]. 3. Wedge-shaped small subpleural focus of ground-glass opacity in the basilar medial right upper lobe, probably a small pulmonary infarct. Additional mild patchy areas of ground-glass opacity in the left upper lobe probably represent areas of mild alveolar hemorrhage. 4. Additional irregular 1.6 cm focus of consolidation in the posterior left upper lobe, which is indeterminate. A follow-up chest CT is recommended in 3 months with attention to this focus. Critical Value/emergent results were called by telephone at the time of interpretation on 08/23/2015 at 5:25 pm to Dr. Leo Grosser , who verbally acknowledged these results. Electronically Signed   By: Ilona Sorrel M.D.   On: 08/23/2015 17:37   Ir Angiogram Pulmonary Bilateral Selective  Result Date: 08/23/2015 INDICATION: History of sub massive pulmonary embolism  and right-sided heart strain. Request made for initiation of bilateral ultrasound assisted pulmonary arterial catheter directed thrombolysis. EXAM: 1. ULTRASOUND GUIDANCE FOR VENOUS ACCESS X2 2. BILATERAL PULMONARY ARTERIOGRAPHY 3. FLUOROSCOPIC GUIDED PLACEMENT OF BILATERAL PULMONARY ARTERIAL LYTIC INFUSION CATHETERS COMPARISON:  Chest CTA - earlier same day; right lower extremity venous Doppler ultrasound - earlier same day MEDICATIONS: Moderate (conscious) sedation was employed during this procedure. A total of  Versed 1.5 mg and Fentanyl 75 mcg was administered intravenously. Moderate Sedation Time: 30 minutes. The patient's level of consciousness and vital signs were monitored continuously by radiology nursing throughout the procedure under my direct supervision. CONTRAST:  20 cc Isovue-300 FLUOROSCOPY TIME:  5 minutes 36 seconds (15 mGy) COMPLICATIONS: None immediate. TECHNIQUE: Informed written consent was obtained from the patient after a discussion of the risks, benefits and alternatives to treatment. Questions regarding the procedure were encouraged and answered. A timeout was performed prior to the initiation of the procedure. Ultrasound scanning was performed of the right groin demonstrated patency of the right common femoral vein. As such, decision was made to perform the bilateral pulmonary arterial catheter thrombolysis from right common femoral vein approach. The right groin was draped in the usual sterile fashion, and a sterile drape was applied covering the operative field. Maximum barrier sterile technique with sterile gowns and gloves were used for the procedure. A timeout was performed prior to the initiation of the procedure. The right femoral head was marked fluoroscopically. The right common femoral vein was accessed with adjacent micro puncture kit under direct ultrasound guidance. Ultrasound images were saved for procedural documentation purposes. This allowed for placement of adjacent 6 French vascular sheaths. With the use of a stiff glidewire, a vertebral catheter was advanced into the right main pulmonary artery and a limited right pulmonary arteriogram was performed with a hand injection. Pressure measurements were then obtained from the main pulmonary artery. Over a Rosen wire, the vertebral catheter was exchanged for a 105/18 cm multi side-hole EKOS ultrasound assisted infusion catheter. With the use of the stiff glidewire, the vertebral catheter was advanced into the left main pulmonary  artery. Over an a Rosen wire, the vertebral catheter was exchanged for a 105/12 cm multi side-hole EKOS ultrasound assisted infusion catheter. A postprocedural fluoroscopic image was obtained of the check demonstrating final catheter positioning. Both vascular sheath were secured at the right neck with interrupted 2-0 Prolene sutures. The external catheter tubing was secured at the right chest and the lytic therapy was initiated. The patient tolerated the procedure well without immediate postprocedural complication. FINDINGS: Acquired pressure measurements: Main pulmonary artery - 55/19; mean - 34 (normal: < 25/10) Following the procedure, both ultrasound assisted infusion catheter tips terminate within the distal aspects of the bilateral lower lobe sub segmental pulmonary arteries. IMPRESSION: 1. Successful fluoroscopic guided initiation of bilateral ultrasound assisted catheter directed pulmonary arterial lysis for sub massive pulmonary embolism and right-sided heart strain. 2. Markedly elevated pressure measurements within the main pulmonary artery compatible with presumed acute pulmonary arterial hypertension. PLAN: - The patient will return to the interventional radiology suite in approximately 12 hours following the initiation of the catheter directed pulmonary arterial lysis for repeat pressure measurements. - Given presence of occlusive DVT within the right femoral and popliteal veins on right lower extremity venous Doppler ultrasound perform earlier today, the appropriateness of IVC filter placement for temporary caval interruption will be discussed with the providing critical care service prior to vascular sheath removal. Electronically Signed   By: Sandi Mariscal  M.D.   On: 08/23/2015 23:28   Ir Angiogram Selective Each Additional Vessel  Result Date: 08/23/2015 INDICATION: History of sub massive pulmonary embolism and right-sided heart strain. Request made for initiation of bilateral ultrasound assisted  pulmonary arterial catheter directed thrombolysis. EXAM: 1. ULTRASOUND GUIDANCE FOR VENOUS ACCESS X2 2. BILATERAL PULMONARY ARTERIOGRAPHY 3. FLUOROSCOPIC GUIDED PLACEMENT OF BILATERAL PULMONARY ARTERIAL LYTIC INFUSION CATHETERS COMPARISON:  Chest CTA - earlier same day; right lower extremity venous Doppler ultrasound - earlier same day MEDICATIONS: Moderate (conscious) sedation was employed during this procedure. A total of Versed 1.5 mg and Fentanyl 75 mcg was administered intravenously. Moderate Sedation Time: 30 minutes. The patient's level of consciousness and vital signs were monitored continuously by radiology nursing throughout the procedure under my direct supervision. CONTRAST:  20 cc Isovue-300 FLUOROSCOPY TIME:  5 minutes 36 seconds (15 mGy) COMPLICATIONS: None immediate. TECHNIQUE: Informed written consent was obtained from the patient after a discussion of the risks, benefits and alternatives to treatment. Questions regarding the procedure were encouraged and answered. A timeout was performed prior to the initiation of the procedure. Ultrasound scanning was performed of the right groin demonstrated patency of the right common femoral vein. As such, decision was made to perform the bilateral pulmonary arterial catheter thrombolysis from right common femoral vein approach. The right groin was draped in the usual sterile fashion, and a sterile drape was applied covering the operative field. Maximum barrier sterile technique with sterile gowns and gloves were used for the procedure. A timeout was performed prior to the initiation of the procedure. The right femoral head was marked fluoroscopically. The right common femoral vein was accessed with adjacent micro puncture kit under direct ultrasound guidance. Ultrasound images were saved for procedural documentation purposes. This allowed for placement of adjacent 6 French vascular sheaths. With the use of a stiff glidewire, a vertebral catheter was advanced  into the right main pulmonary artery and a limited right pulmonary arteriogram was performed with a hand injection. Pressure measurements were then obtained from the main pulmonary artery. Over a Rosen wire, the vertebral catheter was exchanged for a 105/18 cm multi side-hole EKOS ultrasound assisted infusion catheter. With the use of the stiff glidewire, the vertebral catheter was advanced into the left main pulmonary artery. Over an a Rosen wire, the vertebral catheter was exchanged for a 105/12 cm multi side-hole EKOS ultrasound assisted infusion catheter. A postprocedural fluoroscopic image was obtained of the check demonstrating final catheter positioning. Both vascular sheath were secured at the right neck with interrupted 2-0 Prolene sutures. The external catheter tubing was secured at the right chest and the lytic therapy was initiated. The patient tolerated the procedure well without immediate postprocedural complication. FINDINGS: Acquired pressure measurements: Main pulmonary artery - 55/19; mean - 34 (normal: < 25/10) Following the procedure, both ultrasound assisted infusion catheter tips terminate within the distal aspects of the bilateral lower lobe sub segmental pulmonary arteries. IMPRESSION: 1. Successful fluoroscopic guided initiation of bilateral ultrasound assisted catheter directed pulmonary arterial lysis for sub massive pulmonary embolism and right-sided heart strain. 2. Markedly elevated pressure measurements within the main pulmonary artery compatible with presumed acute pulmonary arterial hypertension. PLAN: - The patient will return to the interventional radiology suite in approximately 12 hours following the initiation of the catheter directed pulmonary arterial lysis for repeat pressure measurements. - Given presence of occlusive DVT within the right femoral and popliteal veins on right lower extremity venous Doppler ultrasound perform earlier today, the appropriateness of IVC filter  placement  for temporary caval interruption will be discussed with the providing critical care service prior to vascular sheath removal. Electronically Signed   By: Sandi Mariscal M.D.   On: 08/23/2015 23:28   Ir Angiogram Selective Each Additional Vessel  Result Date: 08/23/2015 INDICATION: History of sub massive pulmonary embolism and right-sided heart strain. Request made for initiation of bilateral ultrasound assisted pulmonary arterial catheter directed thrombolysis. EXAM: 1. ULTRASOUND GUIDANCE FOR VENOUS ACCESS X2 2. BILATERAL PULMONARY ARTERIOGRAPHY 3. FLUOROSCOPIC GUIDED PLACEMENT OF BILATERAL PULMONARY ARTERIAL LYTIC INFUSION CATHETERS COMPARISON:  Chest CTA - earlier same day; right lower extremity venous Doppler ultrasound - earlier same day MEDICATIONS: Moderate (conscious) sedation was employed during this procedure. A total of Versed 1.5 mg and Fentanyl 75 mcg was administered intravenously. Moderate Sedation Time: 30 minutes. The patient's level of consciousness and vital signs were monitored continuously by radiology nursing throughout the procedure under my direct supervision. CONTRAST:  20 cc Isovue-300 FLUOROSCOPY TIME:  5 minutes 36 seconds (15 mGy) COMPLICATIONS: None immediate. TECHNIQUE: Informed written consent was obtained from the patient after a discussion of the risks, benefits and alternatives to treatment. Questions regarding the procedure were encouraged and answered. A timeout was performed prior to the initiation of the procedure. Ultrasound scanning was performed of the right groin demonstrated patency of the right common femoral vein. As such, decision was made to perform the bilateral pulmonary arterial catheter thrombolysis from right common femoral vein approach. The right groin was draped in the usual sterile fashion, and a sterile drape was applied covering the operative field. Maximum barrier sterile technique with sterile gowns and gloves were used for the procedure. A  timeout was performed prior to the initiation of the procedure. The right femoral head was marked fluoroscopically. The right common femoral vein was accessed with adjacent micro puncture kit under direct ultrasound guidance. Ultrasound images were saved for procedural documentation purposes. This allowed for placement of adjacent 6 French vascular sheaths. With the use of a stiff glidewire, a vertebral catheter was advanced into the right main pulmonary artery and a limited right pulmonary arteriogram was performed with a hand injection. Pressure measurements were then obtained from the main pulmonary artery. Over a Rosen wire, the vertebral catheter was exchanged for a 105/18 cm multi side-hole EKOS ultrasound assisted infusion catheter. With the use of the stiff glidewire, the vertebral catheter was advanced into the left main pulmonary artery. Over an a Rosen wire, the vertebral catheter was exchanged for a 105/12 cm multi side-hole EKOS ultrasound assisted infusion catheter. A postprocedural fluoroscopic image was obtained of the check demonstrating final catheter positioning. Both vascular sheath were secured at the right neck with interrupted 2-0 Prolene sutures. The external catheter tubing was secured at the right chest and the lytic therapy was initiated. The patient tolerated the procedure well without immediate postprocedural complication. FINDINGS: Acquired pressure measurements: Main pulmonary artery - 55/19; mean - 34 (normal: < 25/10) Following the procedure, both ultrasound assisted infusion catheter tips terminate within the distal aspects of the bilateral lower lobe sub segmental pulmonary arteries. IMPRESSION: 1. Successful fluoroscopic guided initiation of bilateral ultrasound assisted catheter directed pulmonary arterial lysis for sub massive pulmonary embolism and right-sided heart strain. 2. Markedly elevated pressure measurements within the main pulmonary artery compatible with presumed  acute pulmonary arterial hypertension. PLAN: - The patient will return to the interventional radiology suite in approximately 12 hours following the initiation of the catheter directed pulmonary arterial lysis for repeat pressure measurements. - Given presence of  occlusive DVT within the right femoral and popliteal veins on right lower extremity venous Doppler ultrasound perform earlier today, the appropriateness of IVC filter placement for temporary caval interruption will be discussed with the providing critical care service prior to vascular sheath removal. Electronically Signed   By: Sandi Mariscal M.D.   On: 08/23/2015 23:28   US Venous Img Lower Unilateral Right  Result Date: 08/23/2015 CLINICAL DATA:  Right lower extremity pain and swelling. Evaluate for DVT. EXAM: Right LOWER EXTREMITY VENOUS DOPPLER ULTRASOUND TECHNIQUE: Gray-scale sonography with graded compression, as well as color Doppler and duplex ultrasound were performed to evaluate the lower extremity deep venous systems from the level of the common femoral vein and including the common femoral, femoral, profunda femoral, popliteal and calf veins including the posterior tibial, peroneal and gastrocnemius veins when visible. The superficial great saphenous vein was also interrogated. Spectral Doppler was utilized to evaluate flow at rest and with distal augmentation maneuvers in the common femoral, femoral and popliteal veins. COMPARISON:  None. FINDINGS: Expanded veins with internal echoes affecting the distal right femoral vein, popliteal vein, and posterior tibial/peroneal calf veins consistent with occlusive thrombus. No internal color Doppler flow. Above the clot deep veins are patent with symmetric respiratory phasicity. These results will be called to the ordering clinician or representative by the Radiology Department at the imaging location prior to patient disposition. IMPRESSION: Positive for occlusive deep venous thrombosis from the  distal right femoral vein into the calf. Electronically Signed   By: Monte Fantasia M.D.   On: 08/23/2015 13:53   Ir US Guide Vasc Access Right  Result Date: 08/23/2015 INDICATION: History of sub massive pulmonary embolism and right-sided heart strain. Request made for initiation of bilateral ultrasound assisted pulmonary arterial catheter directed thrombolysis. EXAM: 1. ULTRASOUND GUIDANCE FOR VENOUS ACCESS X2 2. BILATERAL PULMONARY ARTERIOGRAPHY 3. FLUOROSCOPIC GUIDED PLACEMENT OF BILATERAL PULMONARY ARTERIAL LYTIC INFUSION CATHETERS COMPARISON:  Chest CTA - earlier same day; right lower extremity venous Doppler ultrasound - earlier same day MEDICATIONS: Moderate (conscious) sedation was employed during this procedure. A total of Versed 1.5 mg and Fentanyl 75 mcg was administered intravenously. Moderate Sedation Time: 30 minutes. The patient's level of consciousness and vital signs were monitored continuously by radiology nursing throughout the procedure under my direct supervision. CONTRAST:  20 cc Isovue-300 FLUOROSCOPY TIME:  5 minutes 36 seconds (15 mGy) COMPLICATIONS: None immediate. TECHNIQUE: Informed written consent was obtained from the patient after a discussion of the risks, benefits and alternatives to treatment. Questions regarding the procedure were encouraged and answered. A timeout was performed prior to the initiation of the procedure. Ultrasound scanning was performed of the right groin demonstrated patency of the right common femoral vein. As such, decision was made to perform the bilateral pulmonary arterial catheter thrombolysis from right common femoral vein approach. The right groin was draped in the usual sterile fashion, and a sterile drape was applied covering the operative field. Maximum barrier sterile technique with sterile gowns and gloves were used for the procedure. A timeout was performed prior to the initiation of the procedure. The right femoral head was marked  fluoroscopically. The right common femoral vein was accessed with adjacent micro puncture kit under direct ultrasound guidance. Ultrasound images were saved for procedural documentation purposes. This allowed for placement of adjacent 6 French vascular sheaths. With the use of a stiff glidewire, a vertebral catheter was advanced into the right main pulmonary artery and a limited right pulmonary arteriogram was performed with a hand  injection. Pressure measurements were then obtained from the main pulmonary artery. Over a Rosen wire, the vertebral catheter was exchanged for a 105/18 cm multi side-hole EKOS ultrasound assisted infusion catheter. With the use of the stiff glidewire, the vertebral catheter was advanced into the left main pulmonary artery. Over an a Rosen wire, the vertebral catheter was exchanged for a 105/12 cm multi side-hole EKOS ultrasound assisted infusion catheter. A postprocedural fluoroscopic image was obtained of the check demonstrating final catheter positioning. Both vascular sheath were secured at the right neck with interrupted 2-0 Prolene sutures. The external catheter tubing was secured at the right chest and the lytic therapy was initiated. The patient tolerated the procedure well without immediate postprocedural complication. FINDINGS: Acquired pressure measurements: Main pulmonary artery - 55/19; mean - 34 (normal: < 25/10) Following the procedure, both ultrasound assisted infusion catheter tips terminate within the distal aspects of the bilateral lower lobe sub segmental pulmonary arteries. IMPRESSION: 1. Successful fluoroscopic guided initiation of bilateral ultrasound assisted catheter directed pulmonary arterial lysis for sub massive pulmonary embolism and right-sided heart strain. 2. Markedly elevated pressure measurements within the main pulmonary artery compatible with presumed acute pulmonary arterial hypertension. PLAN: - The patient will return to the interventional radiology  suite in approximately 12 hours following the initiation of the catheter directed pulmonary arterial lysis for repeat pressure measurements. - Given presence of occlusive DVT within the right femoral and popliteal veins on right lower extremity venous Doppler ultrasound perform earlier today, the appropriateness of IVC filter placement for temporary caval interruption will be discussed with the providing critical care service prior to vascular sheath removal. Electronically Signed   By: Sandi Mariscal M.D.   On: 08/23/2015 23:28   Ir Infusion Thrombol Arterial Initial (ms)  Result Date: 08/23/2015 INDICATION: History of sub massive pulmonary embolism and right-sided heart strain. Request made for initiation of bilateral ultrasound assisted pulmonary arterial catheter directed thrombolysis. EXAM: 1. ULTRASOUND GUIDANCE FOR VENOUS ACCESS X2 2. BILATERAL PULMONARY ARTERIOGRAPHY 3. FLUOROSCOPIC GUIDED PLACEMENT OF BILATERAL PULMONARY ARTERIAL LYTIC INFUSION CATHETERS COMPARISON:  Chest CTA - earlier same day; right lower extremity venous Doppler ultrasound - earlier same day MEDICATIONS: Moderate (conscious) sedation was employed during this procedure. A total of Versed 1.5 mg and Fentanyl 75 mcg was administered intravenously. Moderate Sedation Time: 30 minutes. The patient's level of consciousness and vital signs were monitored continuously by radiology nursing throughout the procedure under my direct supervision. CONTRAST:  20 cc Isovue-300 FLUOROSCOPY TIME:  5 minutes 36 seconds (15 mGy) COMPLICATIONS: None immediate. TECHNIQUE: Informed written consent was obtained from the patient after a discussion of the risks, benefits and alternatives to treatment. Questions regarding the procedure were encouraged and answered. A timeout was performed prior to the initiation of the procedure. Ultrasound scanning was performed of the right groin demonstrated patency of the right common femoral vein. As such, decision was made  to perform the bilateral pulmonary arterial catheter thrombolysis from right common femoral vein approach. The right groin was draped in the usual sterile fashion, and a sterile drape was applied covering the operative field. Maximum barrier sterile technique with sterile gowns and gloves were used for the procedure. A timeout was performed prior to the initiation of the procedure. The right femoral head was marked fluoroscopically. The right common femoral vein was accessed with adjacent micro puncture kit under direct ultrasound guidance. Ultrasound images were saved for procedural documentation purposes. This allowed for placement of adjacent 6 French vascular sheaths. With the use of  a stiff glidewire, a vertebral catheter was advanced into the right main pulmonary artery and a limited right pulmonary arteriogram was performed with a hand injection. Pressure measurements were then obtained from the main pulmonary artery. Over a Rosen wire, the vertebral catheter was exchanged for a 105/18 cm multi side-hole EKOS ultrasound assisted infusion catheter. With the use of the stiff glidewire, the vertebral catheter was advanced into the left main pulmonary artery. Over an a Rosen wire, the vertebral catheter was exchanged for a 105/12 cm multi side-hole EKOS ultrasound assisted infusion catheter. A postprocedural fluoroscopic image was obtained of the check demonstrating final catheter positioning. Both vascular sheath were secured at the right neck with interrupted 2-0 Prolene sutures. The external catheter tubing was secured at the right chest and the lytic therapy was initiated. The patient tolerated the procedure well without immediate postprocedural complication. FINDINGS: Acquired pressure measurements: Main pulmonary artery - 55/19; mean - 34 (normal: < 25/10) Following the procedure, both ultrasound assisted infusion catheter tips terminate within the distal aspects of the bilateral lower lobe sub segmental  pulmonary arteries. IMPRESSION: 1. Successful fluoroscopic guided initiation of bilateral ultrasound assisted catheter directed pulmonary arterial lysis for sub massive pulmonary embolism and right-sided heart strain. 2. Markedly elevated pressure measurements within the main pulmonary artery compatible with presumed acute pulmonary arterial hypertension. PLAN: - The patient will return to the interventional radiology suite in approximately 12 hours following the initiation of the catheter directed pulmonary arterial lysis for repeat pressure measurements. - Given presence of occlusive DVT within the right femoral and popliteal veins on right lower extremity venous Doppler ultrasound perform earlier today, the appropriateness of IVC filter placement for temporary caval interruption will be discussed with the providing critical care service prior to vascular sheath removal. Electronically Signed   By: Sandi Mariscal M.D.   On: 08/23/2015 23:28   Ir Infusion Thrombol Arterial Initial (ms)  Result Date: 08/23/2015 INDICATION: History of sub massive pulmonary embolism and right-sided heart strain. Request made for initiation of bilateral ultrasound assisted pulmonary arterial catheter directed thrombolysis. EXAM: 1. ULTRASOUND GUIDANCE FOR VENOUS ACCESS X2 2. BILATERAL PULMONARY ARTERIOGRAPHY 3. FLUOROSCOPIC GUIDED PLACEMENT OF BILATERAL PULMONARY ARTERIAL LYTIC INFUSION CATHETERS COMPARISON:  Chest CTA - earlier same day; right lower extremity venous Doppler ultrasound - earlier same day MEDICATIONS: Moderate (conscious) sedation was employed during this procedure. A total of Versed 1.5 mg and Fentanyl 75 mcg was administered intravenously. Moderate Sedation Time: 30 minutes. The patient's level of consciousness and vital signs were monitored continuously by radiology nursing throughout the procedure under my direct supervision. CONTRAST:  20 cc Isovue-300 FLUOROSCOPY TIME:  5 minutes 36 seconds (15 mGy)  COMPLICATIONS: None immediate. TECHNIQUE: Informed written consent was obtained from the patient after a discussion of the risks, benefits and alternatives to treatment. Questions regarding the procedure were encouraged and answered. A timeout was performed prior to the initiation of the procedure. Ultrasound scanning was performed of the right groin demonstrated patency of the right common femoral vein. As such, decision was made to perform the bilateral pulmonary arterial catheter thrombolysis from right common femoral vein approach. The right groin was draped in the usual sterile fashion, and a sterile drape was applied covering the operative field. Maximum barrier sterile technique with sterile gowns and gloves were used for the procedure. A timeout was performed prior to the initiation of the procedure. The right femoral head was marked fluoroscopically. The right common femoral vein was accessed with adjacent micro puncture kit under  direct ultrasound guidance. Ultrasound images were saved for procedural documentation purposes. This allowed for placement of adjacent 6 French vascular sheaths. With the use of a stiff glidewire, a vertebral catheter was advanced into the right main pulmonary artery and a limited right pulmonary arteriogram was performed with a hand injection. Pressure measurements were then obtained from the main pulmonary artery. Over a Rosen wire, the vertebral catheter was exchanged for a 105/18 cm multi side-hole EKOS ultrasound assisted infusion catheter. With the use of the stiff glidewire, the vertebral catheter was advanced into the left main pulmonary artery. Over an a Rosen wire, the vertebral catheter was exchanged for a 105/12 cm multi side-hole EKOS ultrasound assisted infusion catheter. A postprocedural fluoroscopic image was obtained of the check demonstrating final catheter positioning. Both vascular sheath were secured at the right neck with interrupted 2-0 Prolene sutures. The  external catheter tubing was secured at the right chest and the lytic therapy was initiated. The patient tolerated the procedure well without immediate postprocedural complication. FINDINGS: Acquired pressure measurements: Main pulmonary artery - 55/19; mean - 34 (normal: < 25/10) Following the procedure, both ultrasound assisted infusion catheter tips terminate within the distal aspects of the bilateral lower lobe sub segmental pulmonary arteries. IMPRESSION: 1. Successful fluoroscopic guided initiation of bilateral ultrasound assisted catheter directed pulmonary arterial lysis for sub massive pulmonary embolism and right-sided heart strain. 2. Markedly elevated pressure measurements within the main pulmonary artery compatible with presumed acute pulmonary arterial hypertension. PLAN: - The patient will return to the interventional radiology suite in approximately 12 hours following the initiation of the catheter directed pulmonary arterial lysis for repeat pressure measurements. - Given presence of occlusive DVT within the right femoral and popliteal veins on right lower extremity venous Doppler ultrasound perform earlier today, the appropriateness of IVC filter placement for temporary caval interruption will be discussed with the providing critical care service prior to vascular sheath removal. Electronically Signed   By: Sandi Mariscal M.D.   On: 08/23/2015 23:28   Ir Jacolyn Reedy F/u Eval Art/ven Final Day (ms)  Result Date: 08/24/2015 INDICATION: 12 hours post bilateral pulmonary artery EKOS lysis. The patient feels much better, with improved dyspnea. EXAM: IR THROMB F/U EVAL ART/VEN FINAL DAY COMPARISON:  None. MEDICATIONS: None. ANESTHESIA/SEDATION: None FLUOROSCOPY TIME:  Fluoroscopy Time:  minutes 6 seconds ( mGy). COMPLICATIONS: None immediate. TECHNIQUE: Fluoroscopy confirms stable position of the bilateral pulmonary artery catheters. The right pulmonary artery 1 catheter was retracted into the main pulmonary  artery and a pressure was obtained. Post lysed cysts pressure was measured at 55/19 mm Hg with a mean of 32 mm Hg. The pulmonary artery catheters and right groin sheaths were removed. Hemostasis was achieved with direct pressure. FINDINGS: Imaging confirms stable position of the bilateral pulmonary artery catheters. IMPRESSION: After 12 hours of lysis, pulmonary artery pressures remained stable. Final mean pressure of 32 mm Hg was determined. Electronically Signed   By: Marybelle Killings M.D.   On: 08/24/2015 16:00    Assessment & Plan:   Diagnoses and all orders for this visit:  Acute saddle pulmonary embolism with acute cor pulmonale (HCC)  Acute deep vein thrombosis (DVT) of right lower extremity, unspecified vein (HCC)  Acute pharyngitis, unspecified etiology  Bilateral carotid bruits -     US Carotid Bilateral   I am having Kristen Castillo maintain her Cholecalciferol, calcium carbonate, multivitamin, OVER THE COUNTER MEDICATION, budesonide, apixaban, pantoprazole, and ALPRAZolam.  No orders of the defined types were placed in this encounter.  Follow-up: Return in about 3 months (around 02/07/2016) for a follow-up visit.  Walker Kehr, MD

## 2015-11-07 NOTE — Patient Instructions (Addendum)
Use over-the-counter  "cold" medicines  such as "Tylenol cold" , "Advil cold",  "Mucinex" or" Mucinex D"  for cough and congestion.   Avoid decongestants if you have high blood pressure and use "Afrin" nasal spray for nasal congestion as directed instead. Use" Delsym" or" Robitussin" cough syrup varietis for cough.  You can use plain "Tylenol" or "Advil" for fever, chills and achyness. Use Halls or Ricola cough drops.   "Common cold" symptoms are usually triggered by a virus.  The antibiotics are usually not necessary. On average, a" viral cold" illness would take 4-7 days to resolve. Please, make an appointment if you are not better or if you're worse.   CMET, CBC

## 2015-11-07 NOTE — Assessment & Plan Note (Signed)
On Eliquis

## 2015-11-07 NOTE — Progress Notes (Signed)
Pre visit review using our clinic review tool, if applicable. No additional management support is needed unless otherwise documented below in the visit note. 

## 2015-11-07 NOTE — Assessment & Plan Note (Addendum)
8/17 Strep test

## 2015-11-08 ENCOUNTER — Encounter: Payer: Self-pay | Admitting: Internal Medicine

## 2015-11-08 DIAGNOSIS — Z1321 Encounter for screening for nutritional disorder: Secondary | ICD-10-CM | POA: Diagnosis not present

## 2015-11-08 DIAGNOSIS — Z13228 Encounter for screening for other metabolic disorders: Secondary | ICD-10-CM | POA: Diagnosis not present

## 2015-11-08 DIAGNOSIS — Z01419 Encounter for gynecological examination (general) (routine) without abnormal findings: Secondary | ICD-10-CM | POA: Diagnosis not present

## 2015-11-08 DIAGNOSIS — Z130 Encounter for screening for diseases of the blood and blood-forming organs and certain disorders involving the immune mechanism: Secondary | ICD-10-CM | POA: Diagnosis not present

## 2015-11-08 DIAGNOSIS — E559 Vitamin D deficiency, unspecified: Secondary | ICD-10-CM | POA: Diagnosis not present

## 2015-11-08 DIAGNOSIS — Z6821 Body mass index (BMI) 21.0-21.9, adult: Secondary | ICD-10-CM | POA: Diagnosis not present

## 2015-11-08 DIAGNOSIS — Z1231 Encounter for screening mammogram for malignant neoplasm of breast: Secondary | ICD-10-CM | POA: Diagnosis not present

## 2015-11-08 DIAGNOSIS — R978 Other abnormal tumor markers: Secondary | ICD-10-CM | POA: Diagnosis not present

## 2015-11-08 DIAGNOSIS — L929 Granulomatous disorder of the skin and subcutaneous tissue, unspecified: Secondary | ICD-10-CM | POA: Diagnosis not present

## 2015-11-15 DIAGNOSIS — L918 Other hypertrophic disorders of the skin: Secondary | ICD-10-CM | POA: Diagnosis not present

## 2015-11-15 DIAGNOSIS — D225 Melanocytic nevi of trunk: Secondary | ICD-10-CM | POA: Diagnosis not present

## 2015-11-15 DIAGNOSIS — Z85828 Personal history of other malignant neoplasm of skin: Secondary | ICD-10-CM | POA: Diagnosis not present

## 2015-11-15 DIAGNOSIS — L720 Epidermal cyst: Secondary | ICD-10-CM | POA: Diagnosis not present

## 2015-11-15 DIAGNOSIS — L821 Other seborrheic keratosis: Secondary | ICD-10-CM | POA: Diagnosis not present

## 2015-11-15 DIAGNOSIS — D1801 Hemangioma of skin and subcutaneous tissue: Secondary | ICD-10-CM | POA: Diagnosis not present

## 2015-11-15 DIAGNOSIS — L570 Actinic keratosis: Secondary | ICD-10-CM | POA: Diagnosis not present

## 2015-11-15 DIAGNOSIS — L814 Other melanin hyperpigmentation: Secondary | ICD-10-CM | POA: Diagnosis not present

## 2015-11-15 DIAGNOSIS — D170 Benign lipomatous neoplasm of skin and subcutaneous tissue of head, face and neck: Secondary | ICD-10-CM | POA: Diagnosis not present

## 2015-11-21 ENCOUNTER — Encounter: Payer: Self-pay | Admitting: Internal Medicine

## 2015-11-26 ENCOUNTER — Encounter: Payer: Self-pay | Admitting: Internal Medicine

## 2015-11-27 NOTE — Telephone Encounter (Signed)
Pt is requesting status on the referral for Carotid doppler that was ordered back on 8/29. Pls call pt w/status...Kristen Castillo

## 2015-11-28 ENCOUNTER — Ambulatory Visit (INDEPENDENT_AMBULATORY_CARE_PROVIDER_SITE_OTHER): Payer: Medicare Other | Admitting: Psychology

## 2015-11-28 DIAGNOSIS — F4320 Adjustment disorder, unspecified: Secondary | ICD-10-CM | POA: Diagnosis not present

## 2015-11-29 DIAGNOSIS — H903 Sensorineural hearing loss, bilateral: Secondary | ICD-10-CM | POA: Diagnosis not present

## 2015-12-07 ENCOUNTER — Other Ambulatory Visit: Payer: Medicare Other

## 2015-12-12 ENCOUNTER — Other Ambulatory Visit: Payer: Medicare Other

## 2015-12-18 ENCOUNTER — Ambulatory Visit
Admission: RE | Admit: 2015-12-18 | Discharge: 2015-12-18 | Disposition: A | Discharge status: Home / Self Care | Payer: Medicare Other | Source: Ambulatory Visit | Attending: Internal Medicine | Admitting: Internal Medicine

## 2015-12-18 DIAGNOSIS — I6523 Occlusion and stenosis of bilateral carotid arteries: Secondary | ICD-10-CM | POA: Diagnosis not present

## 2015-12-19 ENCOUNTER — Encounter: Payer: Self-pay | Admitting: Internal Medicine

## 2015-12-19 DIAGNOSIS — H524 Presbyopia: Secondary | ICD-10-CM | POA: Diagnosis not present

## 2015-12-26 ENCOUNTER — Ambulatory Visit (INDEPENDENT_AMBULATORY_CARE_PROVIDER_SITE_OTHER): Payer: No Typology Code available for payment source | Admitting: Psychology

## 2015-12-26 ENCOUNTER — Ambulatory Visit (INDEPENDENT_AMBULATORY_CARE_PROVIDER_SITE_OTHER): Payer: Medicare Other

## 2015-12-26 DIAGNOSIS — F4320 Adjustment disorder, unspecified: Secondary | ICD-10-CM | POA: Diagnosis not present

## 2015-12-26 DIAGNOSIS — Z23 Encounter for immunization: Secondary | ICD-10-CM | POA: Diagnosis not present

## 2015-12-28 ENCOUNTER — Encounter: Payer: Self-pay | Admitting: Emergency Medicine

## 2015-12-29 ENCOUNTER — Encounter: Payer: Self-pay | Admitting: Emergency Medicine

## 2015-12-29 NOTE — Telephone Encounter (Signed)
Please let her know that if this pain continues next week that I would like for her to her a doppler US of that leg. Have her call us next week to let us know if it resolves.

## 2015-12-29 NOTE — Telephone Encounter (Signed)
Spoke with the pt  She states last night she had a pretty bad cramp in her rt leg, same place her clot was before  She is now stating that the pain is better today, and moved behind her knee  There is no swelling or redness  She is on Eliquis  She is asking if she needs [name] or just wait and see if it completely resolves  Please advise thanks!

## 2015-12-29 NOTE — Telephone Encounter (Signed)
Spoke with pt over the phone. She is aware of RB's recommendation. Pt will call us next week and let us know if the pain is still present or if it has resolved. Nothing further was needed at this time.

## 2016-01-01 ENCOUNTER — Telehealth: Payer: Self-pay | Admitting: Emergency Medicine

## 2016-01-01 DIAGNOSIS — I82401 Acute embolism and thrombosis of unspecified deep veins of right lower extremity: Secondary | ICD-10-CM

## 2016-01-01 NOTE — Telephone Encounter (Signed)
I believe for completeness that we should repeat her venous doppler US of B LE to insure no evidence DVT. thanks

## 2016-01-01 NOTE — Telephone Encounter (Signed)
Spoke with pt . Still has tightness in back of right leg but denies pain, redness or swelling.  Pt reports she is walking an hour a day without difficulty and denies sob with exercise. Please advise

## 2016-01-01 NOTE — Telephone Encounter (Signed)
Called and spoke with pt and she is aware of RB recs.  Order has been placed and pt is aware that we will call to schedule this for her.  Nothing futher is needed.

## 2016-01-02 ENCOUNTER — Encounter: Payer: Self-pay | Admitting: Emergency Medicine

## 2016-01-03 ENCOUNTER — Telehealth: Payer: Self-pay | Admitting: Emergency Medicine

## 2016-01-03 DIAGNOSIS — M79604 Pain in right leg: Secondary | ICD-10-CM

## 2016-01-03 DIAGNOSIS — Z86711 Personal history of pulmonary embolism: Secondary | ICD-10-CM

## 2016-01-03 NOTE — Telephone Encounter (Signed)
Order has not been put in.  Please place order & we will schedule.

## 2016-01-03 NOTE — Telephone Encounter (Signed)
Spoke with pt. She is supposed to be scheduled for an ultrasound of her leg. Pt has not been contacted about this. The order was placed on 01/01/16.  [facility] - can you please look into this. Thanks.

## 2016-01-03 NOTE — Telephone Encounter (Signed)
appt 01/05/16 for lower venous doppler Kristen Castillo

## 2016-01-03 NOTE — Telephone Encounter (Signed)
Order has been placed.

## 2016-01-05 ENCOUNTER — Other Ambulatory Visit (HOSPITAL_COMMUNITY): Payer: Self-pay | Admitting: Pulmonary Disease

## 2016-01-05 ENCOUNTER — Ambulatory Visit (HOSPITAL_COMMUNITY)
Admission: RE | Admit: 2016-01-05 | Discharge: 2016-01-05 | Disposition: A | Discharge status: Home / Self Care | Payer: Medicare Other | Source: Ambulatory Visit | Attending: Cardiovascular Disease | Admitting: Cardiovascular Disease

## 2016-01-05 DIAGNOSIS — I82439 Acute embolism and thrombosis of unspecified popliteal vein: Secondary | ICD-10-CM | POA: Diagnosis not present

## 2016-01-05 DIAGNOSIS — Z86711 Personal history of pulmonary embolism: Secondary | ICD-10-CM | POA: Diagnosis not present

## 2016-01-05 DIAGNOSIS — M79604 Pain in right leg: Secondary | ICD-10-CM | POA: Diagnosis not present

## 2016-01-23 ENCOUNTER — Ambulatory Visit: Payer: Medicare Other | Admitting: Psychology

## 2016-02-05 DIAGNOSIS — H2000 Unspecified acute and subacute iridocyclitis: Secondary | ICD-10-CM | POA: Diagnosis not present

## 2016-02-07 ENCOUNTER — Encounter: Payer: Self-pay | Admitting: Internal Medicine

## 2016-02-07 ENCOUNTER — Ambulatory Visit (INDEPENDENT_AMBULATORY_CARE_PROVIDER_SITE_OTHER): Payer: Medicare Other | Admitting: Internal Medicine

## 2016-02-07 ENCOUNTER — Other Ambulatory Visit (INDEPENDENT_AMBULATORY_CARE_PROVIDER_SITE_OTHER): Payer: Medicare Other

## 2016-02-07 VITALS — BP 110/76 | HR 61 | Wt 112.0 lb

## 2016-02-07 DIAGNOSIS — N760 Acute vaginitis: Secondary | ICD-10-CM

## 2016-02-07 DIAGNOSIS — E785 Hyperlipidemia, unspecified: Secondary | ICD-10-CM

## 2016-02-07 DIAGNOSIS — I2602 Saddle embolus of pulmonary artery with acute cor pulmonale: Secondary | ICD-10-CM | POA: Diagnosis not present

## 2016-02-07 DIAGNOSIS — D508 Other iron deficiency anemias: Secondary | ICD-10-CM | POA: Diagnosis not present

## 2016-02-07 DIAGNOSIS — I82401 Acute embolism and thrombosis of unspecified deep veins of right lower extremity: Secondary | ICD-10-CM | POA: Diagnosis not present

## 2016-02-07 DIAGNOSIS — E559 Vitamin D deficiency, unspecified: Secondary | ICD-10-CM

## 2016-02-07 DIAGNOSIS — M818 Other osteoporosis without current pathological fracture: Secondary | ICD-10-CM

## 2016-02-07 LAB — CBC WITH DIFFERENTIAL/PLATELET
BASOS PCT: 0.5 % (ref 0.0–3.0)
Basophils Absolute: 0.1 10*3/uL (ref 0.0–0.1)
EOS PCT: 1.5 % (ref 0.0–5.0)
Eosinophils Absolute: 0.2 10*3/uL (ref 0.0–0.7)
HEMATOCRIT: 42.4 % (ref 36.0–46.0)
HEMOGLOBIN: 14.2 g/dL (ref 12.0–15.0)
LYMPHS PCT: 14.4 % (ref 12.0–46.0)
Lymphs Abs: 1.7 10*3/uL (ref 0.7–4.0)
MCHC: 33.5 g/dL (ref 30.0–36.0)
MCV: 87.9 fl (ref 78.0–100.0)
MONOS PCT: 7.3 % (ref 3.0–12.0)
Monocytes Absolute: 0.8 10*3/uL (ref 0.1–1.0)
NEUTROS ABS: 8.8 10*3/uL — AB (ref 1.4–7.7)
Neutrophils Relative %: 76.3 % (ref 43.0–77.0)
PLATELETS: 329 10*3/uL (ref 150.0–400.0)
RBC: 4.82 Mil/uL (ref 3.87–5.11)
RDW: 14.8 % (ref 11.5–15.5)
WBC: 11.6 10*3/uL — AB (ref 4.0–10.5)

## 2016-02-07 LAB — BASIC METABOLIC PANEL
BUN: 40 mg/dL — AB (ref 6–23)
CALCIUM: 10.1 mg/dL (ref 8.4–10.5)
CO2: 29 meq/L (ref 19–32)
CREATININE: 1.04 mg/dL (ref 0.40–1.20)
Chloride: 104 mEq/L (ref 96–112)
GFR: 54.88 mL/min — ABNORMAL LOW (ref >60.00)
Glucose, Bld: 103 mg/dL — ABNORMAL HIGH (ref 70–99)
Potassium: 4.1 mEq/L (ref 3.5–5.1)
Sodium: 140 mEq/L (ref 135–145)

## 2016-02-07 LAB — LIPID PANEL
CHOL/HDL RATIO: 4
CHOLESTEROL: 210 mg/dL — AB (ref 0–200)
HDL: 56.3 mg/dL (ref >39.00)
LDL Cholesterol: 130 mg/dL — ABNORMAL HIGH (ref 0–99)
NonHDL: 153.72
TRIGLYCERIDES: 119 mg/dL (ref 0.0–149.0)
VLDL: 23.8 mg/dL (ref 0.0–40.0)

## 2016-02-07 LAB — HEPATIC FUNCTION PANEL
ALBUMIN: 3.9 g/dL (ref 3.5–5.2)
ALT: 10 U/L (ref 0–35)
AST: 12 U/L (ref 0–37)
Alkaline Phosphatase: 58 U/L (ref 39–117)
BILIRUBIN DIRECT: 0.1 mg/dL (ref 0.0–0.3)
TOTAL PROTEIN: 7.3 g/dL (ref 6.0–8.3)
Total Bilirubin: 0.7 mg/dL (ref 0.2–1.2)

## 2016-02-07 LAB — TSH: TSH: 2.27 u[IU]/mL (ref 0.35–4.50)

## 2016-02-07 MED ORDER — MEGARED OMEGA-3 KRILL OIL 500 MG PO CAPS: 1.0000 | ORAL_CAPSULE | Freq: Every morning | ORAL | 3 refills | Status: DC

## 2016-02-07 NOTE — Assessment & Plan Note (Signed)
On Eliquis

## 2016-02-07 NOTE — Assessment & Plan Note (Signed)
On Vit D 

## 2016-02-07 NOTE — Progress Notes (Signed)
Subjective:  Patient ID: Kristen Castillo, female    DOB: May 14, 1940  Age: 75 y.o. MRN: [id number]  CC: No chief complaint on file.   HPI Kristen Castillo presents for anxiety, carot artery stenosis, GERD and PE f/u  Outpatient Medications Prior to Visit  Medication Sig Dispense Refill  . ALPRAZolam (XANAX) 0.25 MG tablet TAKE (1) TABLET TWICE DAILY AS NEEDED FOR ANXIETY. 30 tablet 3  . apixaban (ELIQUIS) 5 MG TABS tablet Take 2 tablets (10 mg total) by mouth 2 (two) times daily. 24 tablet 0  . budesonide (RHINOCORT ALLERGY) 32 MCG/ACT nasal spray Place 1 spray into both nostrils daily.    . calcium carbonate (OS-CAL) 600 MG TABS tablet Take 600 mg by mouth 2 (two) times daily with a meal. Reported on 08/29/2015    . Cholecalciferol 1000 UNITS tablet Take 2,000 Units by mouth 2 (two) times daily. Reported on 08/29/2015    . Multiple Vitamin (MULTIVITAMIN) tablet Take 1 tablet by mouth daily. Reported on 08/29/2015    . OVER THE COUNTER MEDICATION guiafenesin - sudafed er 600mg /60 mg as needed    . pantoprazole (PROTONIX) 40 MG tablet Take 1 tablet (40 mg total) by mouth daily. (Patient not taking: Reported on 02/07/2016) 30 tablet 5   No facility-administered medications prior to visit.     ROS Review of Systems  Constitutional: Negative for activity change, appetite change, chills, fatigue and unexpected weight change.  HENT: Negative for congestion, mouth sores and sinus pressure.   Eyes: Negative for visual disturbance.  Respiratory: Negative for cough and chest tightness.   Gastrointestinal: Negative for abdominal pain and nausea.  Genitourinary: Negative for difficulty urinating, frequency and vaginal pain.  Musculoskeletal: Negative for back pain and gait problem.  Skin: Negative for pallor and rash.  Neurological: Negative for dizziness, tremors, weakness, numbness and headaches.  Psychiatric/Behavioral: Negative for confusion, sleep disturbance and suicidal ideas.     Objective:  BP 110/76   Pulse 61   Wt 112 lb (50.8 kg)   SpO2 99%   BMI 20.49 kg/m   BP Readings from Last 3 Encounters:  02/07/16 110/76  11/07/15 120/80  10/17/15 104/68    Wt Readings from Last 3 Encounters:  02/07/16 112 lb (50.8 kg)  11/07/15 119 lb (54 kg)  10/17/15 121 lb (54.9 kg)    Physical Exam  Constitutional: She appears well-developed. No distress.  HENT:  Head: Normocephalic.  Right Ear: External ear normal.  Left Ear: External ear normal.  Nose: Nose normal.  Mouth/Throat: Oropharynx is clear and moist.  Eyes: Conjunctivae are normal. Pupils are equal, round, and reactive to light. Right eye exhibits no discharge. Left eye exhibits no discharge.  Neck: Normal range of motion. Neck supple. No JVD present. No tracheal deviation present. No thyromegaly present.  Cardiovascular: Normal rate, regular rhythm and normal heart sounds.   Pulmonary/Chest: No stridor. No respiratory distress. She has no wheezes.  Abdominal: Soft. Bowel sounds are normal. She exhibits no distension and no mass. There is no tenderness. There is no rebound and no guarding.  Musculoskeletal: She exhibits no edema or tenderness.  Lymphadenopathy:    She has no cervical adenopathy.  Neurological: She displays normal reflexes. No cranial nerve deficit. She exhibits normal muscle tone. Coordination normal.  Skin: No rash noted. No erythema.  Psychiatric: She has a normal mood and affect. Her behavior is normal. Judgment and thought content normal.  abd scars  Lab Results  Component Value Date  WBC 9.2 08/26/2015   HGB 9.1 (L) 08/26/2015   HCT 29.7 (L) 08/26/2015   PLT 252 08/26/2015   GLUCOSE 140 (H) 08/25/2015   CHOL 186 04/14/2013   TRIG 193 (H) 05/09/2014   HDL 51.90 04/14/2013   LDLCALC 104 (H) 04/14/2013   ALT 11 (L) 06/29/2015   AST 15 06/29/2015   NA 137 08/25/2015   K 4.3 08/25/2015   CL 113 (H) 08/25/2015   CREATININE 1.08 (H) 08/25/2015   BUN 28 (H) 08/25/2015    CO2 19 (L) 08/25/2015   TSH 1.50 01/19/2015   INR 1.54 (H) 08/25/2015   HGBA1C 6.0 (H) 11/03/2014    No results found.  Assessment & Plan:   There are no diagnoses linked to this encounter. I am having Kristen Castillo maintain her Cholecalciferol, calcium carbonate, multivitamin, OVER THE COUNTER MEDICATION, budesonide, apixaban, pantoprazole, and ALPRAZolam.  No orders of the defined types were placed in this encounter.    Follow-up: No Follow-up on file.  Walker Kehr, MD

## 2016-02-07 NOTE — Assessment & Plan Note (Signed)
x20+ years - Dr Altheimer - used Forteo 2011-13 and Fosamax x1 Prolia discussed DEXA

## 2016-02-07 NOTE — Progress Notes (Signed)
Pre visit review using our clinic review tool, if applicable. No additional management support is needed unless otherwise documented below in the visit note. 

## 2016-02-19 DIAGNOSIS — H31013 Macula scars of posterior pole (postinflammatory) (post-traumatic), bilateral: Secondary | ICD-10-CM | POA: Diagnosis not present

## 2016-02-19 DIAGNOSIS — H52203 Unspecified astigmatism, bilateral: Secondary | ICD-10-CM | POA: Diagnosis not present

## 2016-02-19 DIAGNOSIS — H11153 Pinguecula, bilateral: Secondary | ICD-10-CM | POA: Diagnosis not present

## 2016-02-19 DIAGNOSIS — H40832 Aqueous misdirection, left eye: Secondary | ICD-10-CM | POA: Diagnosis not present

## 2016-02-19 DIAGNOSIS — H40031 Anatomical narrow angle, right eye: Secondary | ICD-10-CM | POA: Diagnosis not present

## 2016-02-19 DIAGNOSIS — H2000 Unspecified acute and subacute iridocyclitis: Secondary | ICD-10-CM | POA: Diagnosis not present

## 2016-02-20 ENCOUNTER — Encounter: Payer: Self-pay | Admitting: Internal Medicine

## 2016-02-26 ENCOUNTER — Ambulatory Visit: Payer: Medicare Other | Admitting: Emergency Medicine

## 2016-02-27 ENCOUNTER — Encounter: Payer: Self-pay | Admitting: Emergency Medicine

## 2016-02-27 ENCOUNTER — Ambulatory Visit (INDEPENDENT_AMBULATORY_CARE_PROVIDER_SITE_OTHER): Payer: Medicare Other | Admitting: Emergency Medicine

## 2016-02-27 DIAGNOSIS — I2602 Saddle embolus of pulmonary artery with acute cor pulmonale: Secondary | ICD-10-CM | POA: Diagnosis not present

## 2016-02-27 DIAGNOSIS — R059 Cough, unspecified: Secondary | ICD-10-CM

## 2016-02-27 DIAGNOSIS — R05 Cough: Secondary | ICD-10-CM | POA: Diagnosis not present

## 2016-02-27 NOTE — Assessment & Plan Note (Signed)
Inactive at this time. Informed her that she needs to watch closely to see if the fish oil influences her GERD and cough. She will let me know if this occurs.

## 2016-02-27 NOTE — Progress Notes (Signed)
Subjective:    Patient ID: Kristen Castillo, female    DOB: 23-Oct-1940, 75 y.o.   MRN: [id number]  HPI 75 year old never smoker known to me from recent hospitalization. 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 She had several abdominal hernias and required mesh placement in May 2017. I met her in June 2017 after she was admitted for a large right lower extremity DVT and associated saddle pulmonary embolism. She underwent targeted lytic therapy and was admitted to the ICU. She recovered well without any evidence of hypoxemia. Her echocardiogram initially showed evidence of right heart strain. She was discharged on Eliquis. She has been having cough since discharge home. Clear mucous. She has been on loratadine, rhinocort, restarted delsym and tessalon perles for a few months. She is on protonix qd.   ROV 10/17/15 -- This a follow-up visit for history of saddle pulmonary embolism that required targeted lysis in the ICU. She was discharged on Eliquis. She also has a history of chronic cough that appears to be associated with chronic rhinitis and GERD. At her last visit we increased her Protonix temporarily to see if she would benefit, continued her allergy regimen. Her cough is now better.  She remains on eliquis.   ROV 02/27/16 -- Patient has a history of a large saddle pulmonary embolism with hemodynamic compromise and targeted lysis in the ICU performed in July 2017. She has been on Eliquis since then. She also has a history of chronic cough with impact from GERD and chronic rhinitis. Her cough appears to be be well managed. She is about to start fish oil.    Review of Systems As per HPI   Past Medical History:  Diagnosis Date  . Anxiety   . Cancer ([place])    hx skin cancer  . Complication of anesthesia   . Congestion of both ears   . Deviated nasal septum   . Diverticulitis   . GERD (gastroesophageal reflux disease)    . Hair loss   . Hematuria    in past  . History of creation of ostomy    past partial colectomy in 05/03/14  . History of skin cancer   . Kidney cysts   . Migraines    in past  . Osteoarthritis    fingers   . Osteoporosis   . Osteoporosis   . Persistent cough   . Pneumonia    july 2016, pneumonia-06/2015   . PONV (postoperative nausea and vomiting)   . Unspecified vitamin D deficiency   . Ventral hernia      Family History  Problem Relation Age of Onset  . Arthritis Mother   . Colon cancer Mother 90  . Heart disease Father   . Dementia Father   . Diabetes Sister   . Cancer Maternal Grandfather     Lung cancer  . Stomach cancer Neg Hx      Social History   Social History  . Marital status: Married    Spouse name: N/A  . Number of children: N/A  . Years of education: N/A   Occupational History  . Not on file.   Social History Main Topics  . Smoking status: Never Smoker  . Smokeless tobacco: Never Used  . Alcohol use No  . Drug use: No  . Sexual activity: Not on file   Other Topics Concern  . Not on file   Social History Narrative  [place]- education   Married '64   Work: Pharmacist, hospital, [employer] of [employer] in [place], retired   2 adopted sons '69, '71   Marriage in good health- Life is good     Allergies  Allergen Reactions  . Codeine Nausea And Vomiting  . Dilaudid [Hydromorphone Hcl] Other (See Comments)    "too strong - I can't wake up"  . Sulfonamide Derivatives Other (See Comments)    Childhood allergy; reaction unknown  . Tape Other (See Comments)    REDDNESS, BLISTER/  Use paper tape  . Norco [Hydrocodone-Acetaminophen] Rash     Outpatient Medications Prior to Visit  Medication Sig Dispense Refill  . ALPRAZolam (XANAX) 0.25 MG tablet TAKE (1) TABLET TWICE DAILY AS NEEDED FOR ANXIETY. 30 tablet 3  . apixaban (ELIQUIS) 5 MG TABS tablet Take 2 tablets (10 mg total) by mouth 2 (two) times daily. 24 tablet 0  .  budesonide (RHINOCORT ALLERGY) 32 MCG/ACT nasal spray Place 1 spray into both nostrils daily.    . calcium carbonate (OS-CAL) 600 MG TABS tablet Take 600 mg by mouth 2 (two) times daily with a meal. Reported on 08/29/2015    . Cholecalciferol 1000 UNITS tablet Take 2,000 Units by mouth 2 (two) times daily. Reported on 08/29/2015    . MEGARED OMEGA-3 KRILL OIL 500 MG CAPS Take 1 capsule by mouth every morning. 100 capsule 3  . Multiple Vitamin (MULTIVITAMIN) tablet Take 1 tablet by mouth daily. Reported on 08/29/2015    . OVER THE COUNTER MEDICATION guiafenesin - sudafed er 656m/60 mg as needed    . pantoprazole (PROTONIX) 40 MG tablet Take 1 tablet (40 mg total) by mouth daily. 30 tablet 5   No facility-administered medications prior to visit.         Objective:   Physical Exam Vitals:   02/27/16 0909  BP: 140/82  Pulse: 85  SpO2: 97%  Weight: 113 lb (51.3 kg)  Height: 5' 2.25" (1.581 m)    Gen: Pleasant, well-nourished, in no distress,  normal affect  ENT: No lesions,  mouth clear,  oropharynx clear, no postnasal drip  Neck: No JVD, no TMG, no carotid bruits  Lungs: No use of accessory muscles, small breaths, no wheeze.   Cardiovascular: RRR, heart sounds normal, no murmur or gallops, R > L LE edema  Abdomen: she has a binder in place  Musculoskeletal: No deformities, no cyanosis or clubbing  Neuro: alert, non focal  Skin: Warm, no lesions or rashes      Assessment & Plan:  Pulmonary embolism (HCC) We will continue your Eliquis at current dosing until June 2018. In June we will consider changing the dose versus stopping it altogether.  Follow with Dr BLamonte Sakaiin June 2018 or sooner if you have any problems.   Cough Inactive at this time. Informed her that she needs to watch closely to see if the fish oil influences her GERD and cough. She will let me know if this occurs.  RBaltazar Apo MD, PhD 02/27/2016, 9:39 AM [facility] [phone] if  no answer [phone]

## 2016-02-27 NOTE — Assessment & Plan Note (Signed)
We will continue your Eliquis at current dosing until June 2018. In June we will consider changing the dose versus stopping it altogether.  Follow with Dr Lamonte Sakai in June 2018 or sooner if you have any problems.

## 2016-02-27 NOTE — Patient Instructions (Signed)
We will continue your Eliquis at current dosing until June 2018. In June we will consider changing the dose versus stopping it altogether.  Follow with Dr Lamonte Sakai in June 2018 or sooner if you have any problems.

## 2016-03-14 ENCOUNTER — Other Ambulatory Visit: Payer: Self-pay | Admitting: *Deleted

## 2016-03-14 MED ORDER — APIXABAN 5 MG PO TABS: 5.0000 mg | ORAL_TABLET | Freq: Two times a day (BID) | ORAL | 2 refills | Status: DC

## 2016-04-01 ENCOUNTER — Encounter: Payer: Self-pay | Admitting: Internal Medicine

## 2016-04-01 DIAGNOSIS — M810 Age-related osteoporosis without current pathological fracture: Secondary | ICD-10-CM | POA: Diagnosis not present

## 2016-04-01 DIAGNOSIS — M8589 Other specified disorders of bone density and structure, multiple sites: Secondary | ICD-10-CM | POA: Diagnosis not present

## 2016-04-04 DIAGNOSIS — H40032 Anatomical narrow angle, left eye: Secondary | ICD-10-CM | POA: Diagnosis not present

## 2016-04-04 DIAGNOSIS — H4020X0 Unspecified primary angle-closure glaucoma, stage unspecified: Secondary | ICD-10-CM | POA: Diagnosis not present

## 2016-04-05 ENCOUNTER — Encounter: Payer: Self-pay | Admitting: Gynecology

## 2016-04-11 ENCOUNTER — Encounter: Payer: Self-pay | Admitting: Gynecology

## 2016-04-11 NOTE — Telephone Encounter (Signed)
The bone density shows osteoporosis. It listed Dr. Alain Marion as the ordering physician and I'm assuming that she is under his care for this. I have not seen her for some time.

## 2016-04-23 DIAGNOSIS — C44319 Basal cell carcinoma of skin of other parts of face: Secondary | ICD-10-CM | POA: Diagnosis not present

## 2016-04-23 DIAGNOSIS — D1801 Hemangioma of skin and subcutaneous tissue: Secondary | ICD-10-CM | POA: Diagnosis not present

## 2016-04-23 DIAGNOSIS — L821 Other seborrheic keratosis: Secondary | ICD-10-CM | POA: Diagnosis not present

## 2016-04-23 DIAGNOSIS — Z85828 Personal history of other malignant neoplasm of skin: Secondary | ICD-10-CM | POA: Diagnosis not present

## 2016-04-23 DIAGNOSIS — D1724 Benign lipomatous neoplasm of skin and subcutaneous tissue of left leg: Secondary | ICD-10-CM | POA: Diagnosis not present

## 2016-04-30 ENCOUNTER — Ambulatory Visit (INDEPENDENT_AMBULATORY_CARE_PROVIDER_SITE_OTHER): Payer: No Typology Code available for payment source | Admitting: Psychology

## 2016-04-30 ENCOUNTER — Encounter: Payer: Self-pay | Admitting: Internal Medicine

## 2016-04-30 DIAGNOSIS — F4320 Adjustment disorder, unspecified: Secondary | ICD-10-CM | POA: Diagnosis not present

## 2016-04-30 NOTE — Progress Notes (Unsigned)
[facility] report abstracted.

## 2016-05-07 DIAGNOSIS — R1084 Generalized abdominal pain: Secondary | ICD-10-CM | POA: Diagnosis not present

## 2016-05-07 DIAGNOSIS — N281 Cyst of kidney, acquired: Secondary | ICD-10-CM | POA: Diagnosis not present

## 2016-05-07 DIAGNOSIS — R3129 Other microscopic hematuria: Secondary | ICD-10-CM | POA: Diagnosis not present

## 2016-05-15 ENCOUNTER — Ambulatory Visit: Payer: Self-pay | Admitting: Internal Medicine

## 2016-05-21 ENCOUNTER — Ambulatory Visit: Payer: Self-pay | Admitting: Internal Medicine

## 2016-05-28 ENCOUNTER — Telehealth: Payer: Self-pay | Admitting: Pulmonary Disease

## 2016-05-28 ENCOUNTER — Other Ambulatory Visit: Payer: Self-pay | Admitting: Emergency Medicine

## 2016-05-28 NOTE — Telephone Encounter (Signed)
Pt has minor emergency this morning and thinks she missed her dose of Apixaban. She is on it for PE in June 2017 that occurred after surgery. Her question was whether she should take a dose now (which might be her second dose of the day) or wait until tomorrow and risk having missed a dose. I recommended waiting and getting back on her schedule tomorrow. I reassured her that the risk of recurrent thromboembolic event due to one missed dose is vanishingly small.   Merton Border, MD PCCM service Mobile [phone] Pager [phone] 05/28/2016

## 2016-05-29 ENCOUNTER — Ambulatory Visit (INDEPENDENT_AMBULATORY_CARE_PROVIDER_SITE_OTHER): Payer: No Typology Code available for payment source | Admitting: Psychology

## 2016-05-29 DIAGNOSIS — F4320 Adjustment disorder, unspecified: Secondary | ICD-10-CM

## 2016-06-04 ENCOUNTER — Ambulatory Visit (INDEPENDENT_AMBULATORY_CARE_PROVIDER_SITE_OTHER): Payer: Medicare Other | Admitting: Internal Medicine

## 2016-06-04 ENCOUNTER — Other Ambulatory Visit (INDEPENDENT_AMBULATORY_CARE_PROVIDER_SITE_OTHER): Payer: Medicare Other

## 2016-06-04 ENCOUNTER — Encounter: Payer: Self-pay | Admitting: Internal Medicine

## 2016-06-04 VITALS — BP 124/72 | HR 77 | Temp 97.5°F | Resp 16 | Ht 62.0 in | Wt 120.8 lb

## 2016-06-04 DIAGNOSIS — M818 Other osteoporosis without current pathological fracture: Secondary | ICD-10-CM

## 2016-06-04 DIAGNOSIS — E785 Hyperlipidemia, unspecified: Secondary | ICD-10-CM

## 2016-06-04 DIAGNOSIS — I82401 Acute embolism and thrombosis of unspecified deep veins of right lower extremity: Secondary | ICD-10-CM

## 2016-06-04 DIAGNOSIS — K219 Gastro-esophageal reflux disease without esophagitis: Secondary | ICD-10-CM

## 2016-06-04 LAB — CBC WITH DIFFERENTIAL/PLATELET
BASOS PCT: 1.4 % (ref 0.0–3.0)
Basophils Absolute: 0.1 10*3/uL (ref 0.0–0.1)
EOS PCT: 1.8 % (ref 0.0–5.0)
Eosinophils Absolute: 0.1 10*3/uL (ref 0.0–0.7)
HCT: 42.4 % (ref 36.0–46.0)
Hemoglobin: 14.2 g/dL (ref 12.0–15.0)
LYMPHS ABS: 1.6 10*3/uL (ref 0.7–4.0)
Lymphocytes Relative: 22.5 % (ref 12.0–46.0)
MCHC: 33.5 g/dL (ref 30.0–36.0)
MCV: 90.3 fl (ref 78.0–100.0)
MONO ABS: 0.6 10*3/uL (ref 0.1–1.0)
MONOS PCT: 7.9 % (ref 3.0–12.0)
NEUTROS ABS: 4.7 10*3/uL (ref 1.4–7.7)
NEUTROS PCT: 66.4 % (ref 43.0–77.0)
PLATELETS: 331 10*3/uL (ref 150.0–400.0)
RBC: 4.7 Mil/uL (ref 3.87–5.11)
RDW: 14 % (ref 11.5–15.5)
WBC: 7.1 10*3/uL (ref 4.0–10.5)

## 2016-06-04 LAB — BASIC METABOLIC PANEL
BUN: 22 mg/dL (ref 6–23)
CALCIUM: 9.4 mg/dL (ref 8.4–10.5)
CO2: 26 meq/L (ref 19–32)
CREATININE: 0.81 mg/dL (ref 0.40–1.20)
Chloride: 106 mEq/L (ref 96–112)
GFR: 73.17 mL/min (ref >60.00)
Glucose, Bld: 76 mg/dL (ref 70–99)
Potassium: 3.7 mEq/L (ref 3.5–5.1)
SODIUM: 139 meq/L (ref 135–145)

## 2016-06-04 LAB — HEPATIC FUNCTION PANEL
ALT: 9 U/L (ref 0–35)
AST: 15 U/L (ref 0–37)
Albumin: 4 g/dL (ref 3.5–5.2)
Alkaline Phosphatase: 79 U/L (ref 39–117)
BILIRUBIN DIRECT: 0.1 mg/dL (ref 0.0–0.3)
BILIRUBIN TOTAL: 0.4 mg/dL (ref 0.2–1.2)
TOTAL PROTEIN: 7.3 g/dL (ref 6.0–8.3)

## 2016-06-04 LAB — VITAMIN D 25 HYDROXY (VIT D DEFICIENCY, FRACTURES): VITD: 59.41 ng/mL (ref 30.00–100.00)

## 2016-06-04 LAB — TSH: TSH: 2.08 u[IU]/mL (ref 0.35–4.50)

## 2016-06-04 NOTE — Progress Notes (Signed)
Pre-visit discussion using our clinic review tool. No additional management support is needed unless otherwise documented below in the visit note.  

## 2016-06-04 NOTE — Assessment & Plan Note (Signed)
Off protonix

## 2016-06-04 NOTE — Progress Notes (Signed)
Subjective:  Patient ID: Kristen Castillo, female    DOB: 1940/08/08  Age: 76 y.o. MRN: [id number]  CC: Follow-up (dexa scan, colostomy recheck , ) and Back Pain (sharp pain, )   HPI Kristen Castillo presents for DVT/PEs, insomnia, anemia f/u  Outpatient Medications Prior to Visit  Medication Sig Dispense Refill  . budesonide (RHINOCORT ALLERGY) 32 MCG/ACT nasal spray Place 1 spray into both nostrils daily.    . calcium carbonate (OS-CAL) 600 MG TABS tablet Take 600 mg by mouth 2 (two) times daily with a meal. Reported on 08/29/2015    . Cholecalciferol 1000 UNITS tablet Take 2,000 Units by mouth 2 (two) times daily. Reported on 08/29/2015    . ELIQUIS 5 MG TABS tablet TAKE 1 TABLET TWICE DAILY. 60 tablet 1  . Multiple Vitamin (MULTIVITAMIN) tablet Take 1 tablet by mouth daily. Reported on 08/29/2015    . ALPRAZolam (XANAX) 0.25 MG tablet TAKE (1) TABLET TWICE DAILY AS NEEDED FOR ANXIETY. (Patient not taking: Reported on 06/04/2016) 30 tablet 3  . MEGARED OMEGA-3 KRILL OIL 500 MG CAPS Take 1 capsule by mouth every morning. (Patient not taking: Reported on 06/04/2016) 100 capsule 3   No facility-administered medications prior to visit.     ROS Review of Systems  Constitutional: Negative for activity change, appetite change, chills, fatigue and unexpected weight change.  HENT: Negative for congestion, mouth sores and sinus pressure.   Eyes: Negative for visual disturbance.  Respiratory: Negative for cough and chest tightness.   Gastrointestinal: Negative for abdominal pain and nausea.  Genitourinary: Negative for difficulty urinating, frequency and vaginal pain.  Musculoskeletal: Negative for back pain and gait problem.  Skin: Negative for pallor and rash.  Neurological: Negative for dizziness, tremors, weakness, numbness and headaches.  Psychiatric/Behavioral: Negative for confusion and sleep disturbance.    Objective:  BP 124/72   Pulse 77   Temp 97.5 F (36.4 C) (Oral)   Resp 16    Ht 5\' 2"  (1.575 m)   Wt 120 lb 12 oz (54.8 kg)   SpO2 97%   BMI 22.09 kg/m   BP Readings from Last 3 Encounters:  06/04/16 124/72  02/27/16 140/82  02/07/16 110/76    Wt Readings from Last 3 Encounters:  06/04/16 120 lb 12 oz (54.8 kg)  02/27/16 113 lb (51.3 kg)  02/07/16 112 lb (50.8 kg)    Physical Exam  Constitutional: She appears well-developed. No distress.  HENT:  Head: Normocephalic.  Right Ear: External ear normal.  Left Ear: External ear normal.  Nose: Nose normal.  Mouth/Throat: Oropharynx is clear and moist.  Eyes: Conjunctivae are normal. Pupils are equal, round, and reactive to light. Right eye exhibits no discharge. Left eye exhibits no discharge.  Neck: Normal range of motion. Neck supple. No JVD present. No tracheal deviation present. No thyromegaly present.  Cardiovascular: Normal rate, regular rhythm and normal heart sounds.   Pulmonary/Chest: No stridor. No respiratory distress. She has no wheezes.  Abdominal: Soft. Bowel sounds are normal. She exhibits no distension and no mass. There is no tenderness. There is no rebound and no guarding.  Musculoskeletal: She exhibits no edema or tenderness.  Lymphadenopathy:    She has no cervical adenopathy.  Neurological: She displays normal reflexes. No cranial nerve deficit. She exhibits normal muscle tone. Coordination normal.  Skin: No rash noted. No erythema.  Psychiatric: She has a normal mood and affect. Her behavior is normal. Judgment and thought content normal.    Lab Results  Component Value Date   WBC 11.6 (H) 02/07/2016   HGB 14.2 02/07/2016   HCT 42.4 02/07/2016   PLT 329.0 02/07/2016   GLUCOSE 103 (H) 02/07/2016   CHOL 210 (H) 02/07/2016   TRIG 119.0 02/07/2016   HDL 56.30 02/07/2016   LDLCALC 130 (H) 02/07/2016   ALT 10 02/07/2016   AST 12 02/07/2016   NA 140 02/07/2016   K 4.1 02/07/2016   CL 104 02/07/2016   CREATININE 1.04 02/07/2016   BUN 40 (H) 02/07/2016   CO2 29 02/07/2016    TSH 2.27 02/07/2016   INR 1.54 (H) 08/25/2015   HGBA1C 6.0 (H) 11/03/2014    No results found.  Assessment & Plan:   There are no diagnoses linked to this encounter. I am having Kristen Castillo maintain her Cholecalciferol, calcium carbonate, multivitamin, budesonide, ALPRAZolam, MEGARED OMEGA-3 KRILL OIL, and ELIQUIS.  No orders of the defined types were placed in this encounter.    Follow-up: No Follow-up on file.  Kristen Kehr, MD

## 2016-06-04 NOTE — Assessment & Plan Note (Signed)
Eliquis 

## 2016-06-04 NOTE — Assessment & Plan Note (Signed)
Start Prolia Vit D Labs

## 2016-06-06 ENCOUNTER — Encounter: Payer: Self-pay | Admitting: Internal Medicine

## 2016-06-11 NOTE — Progress Notes (Signed)
Pre visit review using our clinic review tool, if applicable. No additional management support is needed unless otherwise documented below in the visit note. 

## 2016-06-11 NOTE — Progress Notes (Addendum)
Subjective:   Kristen Castillo is a 76 y.o. female who presents for Medicare Annual (Subsequent) preventive examination.  Review of Systems:  No ROS.  Medicare Wellness Visit.  Cardiac Risk Factors include: family history of premature cardiovascular disease;advanced age (>45men, >36 women) Sleep patterns: has interrupted sleep, gets up 2 times nightly to void and sleeps 5-6 hours nightly, has daytime sleepiness. Discussed recommended sleep tips   Home Safety/Smoke Alarms:  Feels safe in home. Smoke alarms in place.   Living environment; residence and Firearm Safety: 2-story house, no firearms. Lives with husband and son. Seat Belt Safety/Bike Helmet: Wears seat belt.   Counseling:   Eye Exam- appointment yearly,Dr.Tanner, Dr. Franchot Mimes  Dental- Every 2 months appointments   Female:   Pap- N/A      Mammo- Last 03/01/14, has an upcoming appointment with Dr. Phineas Real      Dexa scan- Last 04/05/16, T-score -2.7        CCS- Last 08/24/14, benign polyps, no recall due to age      Objective:     Vitals: BP 108/64   Pulse 64   Resp 20   Ht 5\' 2"  (1.575 m)   Wt 122 lb (55.3 kg)   SpO2 100%   BMI 22.31 kg/m   Body mass index is 22.31 kg/m.  Tobacco History  Smoking Status  . Never Smoker  Smokeless Tobacco  . Never Used     Counseling given: Not Answered   Past Medical History:  Diagnosis Date  . Anxiety   . Cancer ([place])    hx skin cancer  . Complication of anesthesia   . Congestion of both ears   . Deviated nasal septum   . Diverticulitis   . GERD (gastroesophageal reflux disease)   . Hair loss   . Hematuria    in past  . History of creation of ostomy    past partial colectomy in 05/03/14  . History of skin cancer   . Kidney cysts   . Migraines    in past  . Osteoarthritis    fingers   . Osteoporosis   . Osteoporosis   . Persistent cough   . Pneumonia    july 2016, pneumonia-06/2015   . PONV (postoperative nausea and vomiting)   . Unspecified vitamin D  deficiency   . Ventral hernia    Past Surgical History:  Procedure Laterality Date  . ABDOMINAL HYSTERECTOMY  1972  . FACIAL COSMETIC SURGERY  2009   [name] MD in [place]  . ILEO LOOP COLOSTOMY CLOSURE N/A 11/08/2014   Procedure: LAPAROSCOPIC COLOSTOMY CLOSURE WITH PARTIAL COLECTOMY;  Surgeon: [name], MD;  Location: [facility];  Service: General;  Laterality: N/A;  . INSERTION OF MESH N/A 08/01/2015   Procedure: INSERTION OF MESH;  Surgeon: [name], MD;  Location: [facility];  Service: General;  Laterality: N/A;  . LAPAROSCOPIC LYSIS OF ADHESIONS N/A 11/08/2014   Procedure: LAPAROSCOPIC LYSIS OF ADHESIONS FOR 90 MINUTES;  Surgeon: [name], MD;  Location: [facility];  Service: General;  Laterality: N/A;  . LAPAROSCOPIC LYSIS OF ADHESIONS N/A 08/01/2015   Procedure: LAPAROSCOPIC LYSIS OF ADHESIONS;  Surgeon: [name], MD;  Location: [facility];  Service: General;  Laterality: N/A;  . LAPAROSCOPIC PARTIAL COLECTOMY N/A 05/03/2014   Procedure: DIAGNOSTIC LAPAROSCOPY WITH DRAINAGE OF INTRA ABDOMINAL ABSCESS PARTIAL COLECTOMY ;  Surgeon: [name], MD;  Location: [facility];  Service: General;  Laterality: N/A;  supine position  . LAPAROTOMY N/A 05/03/2014  Procedure: EXPLORATORY LAPAROTOMY WITH HARTMAN PROCEDURE;  Surgeon: [name], MD;  Location: [facility];  Service: General;  Laterality: N/A;  . LIPOMA RESECTION     left scapula area  . OOPHORECTOMY  1976  . REDUCTION MAMMAPLASTY  1994  . RHINOPLASTY  1985  . TONSILLECTOMY    . VENTRAL HERNIA REPAIR N/A 08/01/2015   Procedure: LAPAROSCOPIC VENTRAL INCISIONAL  HERNIA;  Surgeon: [name], MD;  Location: [facility];  Service: General;  Laterality: N/A;   Family History  Problem Relation Age of Onset  . Arthritis Mother   . Colon cancer Mother 88  . Heart disease Father   . Dementia Father   . Diabetes Sister   . Cancer Maternal Grandfather     Lung cancer  . Stomach cancer Neg Hx    History  Sexual Activity  .  Sexual activity: Yes    Outpatient Encounter Prescriptions as of 06/12/2016  Medication Sig  . budesonide (RHINOCORT ALLERGY) 32 MCG/ACT nasal spray Place 1 spray into both nostrils daily.  . calcium carbonate (OS-CAL) 600 MG TABS tablet Take 600 mg by mouth 2 (two) times daily with a meal. Reported on 08/29/2015  . Cholecalciferol 1000 UNITS tablet Take 2,000 Units by mouth 2 (two) times daily. Reported on 08/29/2015  . ELIQUIS 5 MG TABS tablet TAKE 1 TABLET TWICE DAILY.  Marland Kitchen MEGARED OMEGA-3 KRILL OIL 500 MG CAPS Take 1 capsule by mouth every morning.  . Multiple Vitamin (MULTIVITAMIN) tablet Take 1 tablet by mouth daily. Reported on 08/29/2015  . [DISCONTINUED] ALPRAZolam (XANAX) 0.25 MG tablet TAKE (1) TABLET TWICE DAILY AS NEEDED FOR ANXIETY.   No facility-administered encounter medications on file as of 06/12/2016.     Activities of Daily Living In your present state of health, do you have any difficulty performing the following activities: 06/12/2016 08/23/2015  Hearing? N Y  Vision? N N  Difficulty concentrating or making decisions? N N  Walking or climbing stairs? N N  Dressing or bathing? N N  Doing errands, shopping? N N  Preparing Food and eating ? N -  Using the Toilet? N -  In the past six months, have you accidently leaked urine? N -  Do you have problems with loss of bowel control? N -  Managing your Medications? N -  Managing your Finances? N -  Housekeeping or managing your Housekeeping? N -  Some recent data might be hidden    [name], MD as PCP - General (Internal Medicine) Lorne Skeens, MD (Endocrinology) [name], MD as Consulting Physician (General Surgery) Jerene Bears, [name] Marygrace Drought, MD as Consulting Physician (Ophthalmology) Collene Gobble, MD as Consulting Physician (Pulmonary Disease) Oren Binet, PhD as Consulting Physician (Psychology) Anastasio Auerbach, MD as  Consulting Physician (Gynecology)    Assessment:    Physical assessment deferred to PCP.  Exercise Activities and Dietary recommendations Current Exercise Habits: Structured exercise class, Type of exercise: walking;stretching;Other - see comments (zumba classes), Time (Minutes): 40, Frequency (Times/Week): 6, Weekly Exercise (Minutes/Week): 240, Intensity: Mild (mild to moderate)   Diet (meal preparation, eat out, water intake, caffeinated beverages, dairy products, fruits and vegetables): in general, a "healthy" diet  , low fat/ cholesterol, low salt, limits sugar, eats a variety of fruits and vegetables, drinks almost exclusively water and consumes 6-8 16 ounce bottles daily.   Goals    . Maintain current health status  Continue to exercise, eat healthy, and enjoy my family and grandchildren      Fall Risk Fall Risk  06/12/2016 05/25/2015 05/24/2014  Falls in the past year? No No No   Depression Screen PHQ 2/9 Scores 06/12/2016 05/25/2015 05/24/2014  PHQ - 2 Score 1 0 0     Cognitive Function       Ad8 score reviewed for issues:  Issues making decisions: no  Less interest in hobbies / activities: no  Repeats questions, stories (family complaining): no  Trouble using ordinary gadgets (microwave, computer, phone): no  Forgets the month or year: no  Mismanaging finances: no  Remembering appts: no  Daily problems with thinking and/or memory: no Ad8 score is= 0   Immunization History  Administered Date(s) Administered  . H1N1 04/16/2008  . Influenza Whole 12/25/2009, 12/09/2011  . Influenza, High Dose Seasonal PF 12/26/2015  . Influenza,inj,Quad PF,36+ Mos 11/25/2014  . Influenza-Unspecified 03/01/2014  . Pneumococcal Conjugate-13 04/14/2013  . Pneumococcal Polysaccharide-23 03/11/2002, 12/25/2009  . Td 02/09/2009  . Zoster 02/17/2009   Screening Tests Health Maintenance  Topic Date Due  . INFLUENZA VACCINE  10/09/2016  . TETANUS/TDAP  02/10/2019  .  COLONOSCOPY  08/23/2024  . DEXA SCAN  Completed  . PNA vac Low Risk Adult  Completed      Plan:     Continue to eat heart healthy diet (full of fruits, vegetables, whole grains, lean protein, water--limit salt, fat, and sugar intake) and increase physical activity as tolerated.  Continue doing brain stimulating activities (puzzles, reading, adult coloring books, staying active) to keep memory sharp.   Bring a copy of your advance directives to your next office visit.  Start to take omega 3 for cholesterol  Health Coach will research healthy foods to eat when needing to maintain or gain weight and inform patient. Will send education via email regarding upper back exercises. Insomnia and stress reduction education attached to patient's AVS. Will contact patient in response to Medicare coverage questions concerning Prolia  During the course of the visit the patient was educated and counseled about the following appropriate screening and preventive services:   Vaccines to include Pneumoccal, Influenza, Hepatitis B, Td, Zostavax, HCV  Cardiovascular Disease  Colorectal cancer screening  Bone density screening  Diabetes screening  Glaucoma screening  Mammography/PAP  Nutrition counseling   Patient Instructions (the written plan) was given to the patient.   Michiel Cowboy, RN  06/12/2016  Medical screening examination/treatment/procedure(s) were performed by non-physician practitioner and as supervising physician I was immediately available for consultation/collaboration. I agree with above. Walker Kehr, MD

## 2016-06-12 ENCOUNTER — Ambulatory Visit (INDEPENDENT_AMBULATORY_CARE_PROVIDER_SITE_OTHER): Payer: Medicare Other | Admitting: *Deleted

## 2016-06-12 VITALS — BP 108/64 | HR 64 | Resp 20 | Ht 62.0 in | Wt 122.0 lb

## 2016-06-12 DIAGNOSIS — Z0000 Encounter for general adult medical examination without abnormal findings: Secondary | ICD-10-CM

## 2016-06-12 NOTE — Patient Instructions (Addendum)
Continue to eat heart healthy diet (full of fruits, vegetables, whole grains, lean protein, water--limit salt, fat, and sugar intake) and increase physical activity as tolerated.  Continue doing brain stimulating activities (puzzles, reading, adult coloring books, staying active) to keep memory sharp.   Bring a copy of your advance directives to your next office visit.  Start to take omega 3 for cholesterol  Kristen Castillo [phone] call for any questions [email or web address]    Kristen Castillo , Thank you for taking time to come for your Medicare Wellness Visit. I appreciate your ongoing commitment to your health goals. Please review the following plan we discussed and let me know if I can assist you in the future.   These are the goals we discussed: Goals    . Maintain current health status          Continue to exercise, eat healthy, and enjoy my family and grandchildren       This is a list of the screening recommended for you and due dates:  Health Maintenance  Topic Date Due  . Flu Shot  10/09/2016  . Tetanus Vaccine  02/10/2019  . Colon Cancer Screening  08/23/2024  . DEXA scan (bone density measurement)  Completed  . Pneumonia vaccines  Completed    Stress and Stress Management Stress is a normal reaction to life events. It is what you feel when life demands more than you are used to or more than you can handle. Some stress can be useful. For example, the stress reaction can help you catch the last bus of the day, study for a test, or meet a deadline at work. But stress that occurs too often or for too long can cause problems. It can affect your emotional health and interfere with relationships and normal daily activities. Too much stress can weaken your immune system and increase your risk for physical illness. If you already have a medical problem, stress can make it worse. What are the causes? All sorts of life events may cause stress. An event that causes stress for one  person may not be stressful for another person. Major life events commonly cause stress. These may be positive or negative. Examples include losing your job, moving into a new home, getting married, having a baby, or losing a loved one. Less obvious life events may also cause stress, especially if they occur day after day or in combination. Examples include working long hours, driving in traffic, caring for children, being in debt, or being in a difficult relationship. What are the signs or symptoms? Stress may cause emotional symptoms including, the following:  Anxiety. This is feeling worried, afraid, on edge, overwhelmed, or out of control.  Anger. This is feeling irritated or impatient.  Depression. This is feeling sad, down, helpless, or guilty.  Difficulty focusing, remembering, or making decisions. Stress may cause physical symptoms, including the following:  Aches and pains. These may affect your head, neck, back, stomach, or other areas of your body.  Tight muscles or clenched jaw.  Low energy or trouble sleeping. Stress may cause unhealthy behaviors, including the following:  Eating to feel better (overeating) or skipping meals.  Sleeping too little, too much, or both.  Working too much or putting off tasks (procrastination).  Smoking, drinking alcohol, or using drugs to feel better. How is this diagnosed? Stress is diagnosed through an assessment by your health care provider. Your health care provider will ask questions about your symptoms and any stressful life events.Your health  care provider will also ask about your medical history and may order blood tests or other tests. Certain medical conditions and medicine can cause physical symptoms similar to stress. Mental illness can cause emotional symptoms and unhealthy behaviors similar to stress. Your health care provider may refer you to a mental health professional for further evaluation. How is this treated? Stress  management is the recommended treatment for stress.The goals of stress management are reducing stressful life events and coping with stress in healthy ways. Techniques for reducing stressful life events include the following:  Stress identification. Self-monitor for stress and identify what causes stress for you. These skills may help you to avoid some stressful events.  Time management. Set your priorities, keep a calendar of events, and learn to say "no." These tools can help you avoid making too many commitments. Techniques for coping with stress include the following:  Rethinking the problem. Try to think realistically about stressful events rather than ignoring them or overreacting. Try to find the positives in a stressful situation rather than focusing on the negatives.  Exercise. Physical exercise can release both physical and emotional tension. The key is to find a form of exercise you enjoy and do it regularly.  Relaxation techniques. These relax the body and mind. Examples include yoga, meditation, tai chi, biofeedback, deep breathing, progressive muscle relaxation, listening to music, being out in nature, journaling, and other hobbies. Again, the key is to find one or more that you enjoy and can do regularly.  Healthy lifestyle. Eat a balanced diet, get plenty of sleep, and do not smoke. Avoid using alcohol or drugs to relax.  Strong support network. Spend time with family, friends, or other people you enjoy being around.Express your feelings and talk things over with someone you trust. Counseling or talktherapy with a mental health professional may be helpful if you are having difficulty managing stress on your own. Medicine is typically not recommended for the treatment of stress.Talk to your health care provider if you think you need medicine for symptoms of stress. Follow these instructions at home:  Keep all follow-up visits as directed by your health care provider.  Take all  medicines as directed by your health care provider. Contact a health care provider if:  Your symptoms get worse or you start having new symptoms.  You feel overwhelmed by your problems and can no longer manage them on your own. Get help right away if:  You feel like hurting yourself or someone else. This information is not intended to replace advice given to you by your health care provider. Make sure you discuss any questions you have with your health care provider. Document Released: 08/21/2000 Document Revised: 08/03/2015 Document Reviewed: 10/20/2012 Elsevier Interactive Patient Education  2017 [employer]. Insomnia Insomnia is a sleep disorder that makes it difficult to fall asleep or to stay asleep. Insomnia can cause tiredness (fatigue), low energy, difficulty concentrating, mood swings, and poor performance at work or school. There are three different ways to classify insomnia:  Difficulty falling asleep.  Difficulty staying asleep.  Waking up too early in the morning. Any type of insomnia can be long-term (chronic) or short-term (acute). Both are common. Short-term insomnia usually lasts for three months or less. Chronic insomnia occurs at least three times a week for longer than three months. What are the causes? Insomnia may be caused by another condition, situation, or substance, such as:  Anxiety.  Certain medicines.  Gastroesophageal reflux disease (GERD) or other gastrointestinal conditions.  Asthma or other breathing conditions.  Restless legs syndrome, sleep apnea, or other sleep disorders.  Chronic pain.  Menopause. This may include hot flashes.  Stroke.  Abuse of alcohol, tobacco, or illegal drugs.  Depression.  Caffeine.  Neurological disorders, such as Alzheimer disease.  An overactive thyroid (hyperthyroidism). The cause of insomnia may not be known. What increases the risk? Risk factors for insomnia include:  Gender. Women are more  commonly affected than men.  Age. Insomnia is more common as you get older.  Stress. This may involve your professional or personal life.  Income. Insomnia is more common in people with lower income.  Lack of exercise.  Irregular work schedule or night shifts.  Traveling between different time zones. What are the signs or symptoms? If you have insomnia, trouble falling asleep or trouble staying asleep is the main symptom. This may lead to other symptoms, such as:  Feeling fatigued.  Feeling nervous about going to sleep.  Not feeling rested in the morning.  Having trouble concentrating.  Feeling irritable, anxious, or depressed. How is this treated? Treatment for insomnia depends on the cause. If your insomnia is caused by an underlying condition, treatment will focus on addressing the condition. Treatment may also include:  Medicines to help you sleep.  Counseling or therapy.  Lifestyle adjustments. Follow these instructions at home:  Take medicines only as directed by your health care provider.  Keep regular sleeping and waking hours. Avoid naps.  Keep a sleep diary to help you and your health care provider figure out what could be causing your insomnia. Include:  When you sleep.  When you wake up during the night.  How well you sleep.  How rested you feel the next day.  Any side effects of medicines you are taking.  What you eat and drink.  Make your bedroom a comfortable place where it is easy to fall asleep:  Put up shades or special blackout curtains to block light from outside.  Use a white noise machine to block noise.  Keep the temperature cool.  Exercise regularly as directed by your health care provider. Avoid exercising right before bedtime.  Use relaxation techniques to manage stress. Ask your health care provider to suggest some techniques that may work well for you. These may include:  Breathing exercises.  Routines to release muscle  tension.  Visualizing peaceful scenes.  Cut back on alcohol, caffeinated beverages, and cigarettes, especially close to bedtime. These can disrupt your sleep.  Do not overeat or eat spicy foods right before bedtime. This can lead to digestive discomfort that can make it hard for you to sleep.  Limit screen use before bedtime. This includes:  Watching TV.  Using your smartphone, tablet, and computer.  Stick to a routine. This can help you fall asleep faster. Try to do a quiet activity, brush your teeth, and go to bed at the same time each night.  Get out of bed if you are still awake after 15 minutes of trying to sleep. Keep the lights down, but try reading or doing a quiet activity. When you feel sleepy, go back to bed.  Make sure that you drive carefully. Avoid driving if you feel very sleepy.  Keep all follow-up appointments as directed by your health care provider. This is important. Contact a health care provider if:  You are tired throughout the day or have trouble in your daily routine due to sleepiness.  You continue to have sleep problems or your sleep  problems get worse. Get help right away if:  You have serious thoughts about hurting yourself or someone else. This information is not intended to replace advice given to you by your health care provider. Make sure you discuss any questions you have with your health care provider. Document Released: 02/23/2000 Document Revised: 07/28/2015 Document Reviewed: 11/26/2013 Elsevier Interactive Patient Education  2017 [employer].

## 2016-06-26 ENCOUNTER — Ambulatory Visit (INDEPENDENT_AMBULATORY_CARE_PROVIDER_SITE_OTHER): Payer: No Typology Code available for payment source | Admitting: Psychology

## 2016-06-26 DIAGNOSIS — F4320 Adjustment disorder, unspecified: Secondary | ICD-10-CM

## 2016-07-02 DIAGNOSIS — Z85828 Personal history of other malignant neoplasm of skin: Secondary | ICD-10-CM | POA: Diagnosis not present

## 2016-07-02 DIAGNOSIS — L03011 Cellulitis of right finger: Secondary | ICD-10-CM | POA: Diagnosis not present

## 2016-07-19 DIAGNOSIS — H524 Presbyopia: Secondary | ICD-10-CM | POA: Diagnosis not present

## 2016-07-24 ENCOUNTER — Ambulatory Visit: Payer: Self-pay | Admitting: Gynecology

## 2016-07-26 ENCOUNTER — Ambulatory Visit (INDEPENDENT_AMBULATORY_CARE_PROVIDER_SITE_OTHER): Payer: No Typology Code available for payment source | Admitting: Psychology

## 2016-07-26 DIAGNOSIS — F4320 Adjustment disorder, unspecified: Secondary | ICD-10-CM | POA: Diagnosis not present

## 2016-08-01 ENCOUNTER — Other Ambulatory Visit: Payer: Self-pay | Admitting: Emergency Medicine

## 2016-08-08 ENCOUNTER — Ambulatory Visit (INDEPENDENT_AMBULATORY_CARE_PROVIDER_SITE_OTHER): Payer: Medicare Other | Admitting: Gynecology

## 2016-08-08 ENCOUNTER — Encounter: Payer: Self-pay | Admitting: Gynecology

## 2016-08-08 VITALS — BP 118/76 | Ht 62.0 in | Wt 122.0 lb

## 2016-08-08 DIAGNOSIS — N952 Postmenopausal atrophic vaginitis: Secondary | ICD-10-CM | POA: Diagnosis not present

## 2016-08-08 DIAGNOSIS — N941 Unspecified dyspareunia: Secondary | ICD-10-CM

## 2016-08-08 DIAGNOSIS — R87615 Unsatisfactory cytologic smear of cervix: Secondary | ICD-10-CM

## 2016-08-08 NOTE — Progress Notes (Signed)
    Kristen Castillo Dec 24, 1940 [id number]        75 y.o.  G1P0010 new patient who presents to establish care. She states that she is here just to talk and she is within the year of her last normal GYN exam. She does have several issues that she wanted to talk to me about:  1. 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 Had a subsequent BSO for benign ovarian cysts four years later. Follow up beta-hCGs returned to negative and she has been followed with annual hCG's which have been reportedly all negative. 2. 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 3. Dyspareunia. Is using water-based OTC lubricants without success. Asked about alternatives. 4. Osteoporosis with history of being treated with Fosamax and Forteo actively followed by Dr. Alain Marion     Past medical history,surgical history, problem list, medications, allergies, family history and social history were all reviewed and documented in the EPIC chart.  Directed ROS with pertinent positives and negatives documented in the history of present illness/assessment and plan.  Exam: Vitals:   08/08/16 1155  BP: 118/76  Weight: 122 lb (55.3 kg)  Height: 5\' 2"  (1.575 m)   General appearance:  Normal   Assessment/Plan:  76 y.o. G1P0010 Who presents to establish care with several issues as noted above.  1. 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 Patient very reluctant to consider vaginal estrogen. Recommended patient try oil-based products to start. If it continues to be an issue I discussed the possibility of the [employer] treatment. Issues with this  discussed to include has not been available for a long time and long-term safety not proven. The cost and whether further treatments in the future would be required also discussed. Patient will go ahead and try the oil-based products and then follow up with me at the end of summer when she is due for her physical exam and will rediscuss this. 2. History of molar pregnancy number of years ago. Is doing annual quantitative hCGs and feels very comfortable with continuing this. Will plan to draw this when she comes back for her annual exam. 3. History of unsatisfactory Pap smears. Reviewed with patient not unusual in a 76 year old patient with atrophic changes. She is status post hysterectomy in the past. Reviewed options to stop screening altogether per current screening guidelines were to continue with screening and except the unsatisfactory reading. As long as no atypical cells I would feel comfortable with this. 4. Osteoporosis. Several treatments previously. Actively being followed by Dr. Alain Marion continue to do so.  Assuming patient has no other issues then she will follow up late summer for full physical exam.  Greater than 50% of my time was spent in direct face to face counseling and coordination of care with the patient.   Anastasio Auerbach MD, 1:05 PM 08/08/2016

## 2016-08-08 NOTE — Patient Instructions (Signed)
Follow-up end of summer for annual exam 

## 2016-08-15 ENCOUNTER — Telehealth: Payer: Self-pay

## 2016-08-15 NOTE — Telephone Encounter (Signed)
Left message advising patient that insurance has been verified for prolia injections---estimated $215 copay---can talk with tamara if any further questions, or can schedule on nurse visit at patient's earliest convenience, this will be first prolia injection

## 2016-08-22 ENCOUNTER — Encounter: Payer: Self-pay | Admitting: Internal Medicine

## 2016-08-27 ENCOUNTER — Ambulatory Visit (INDEPENDENT_AMBULATORY_CARE_PROVIDER_SITE_OTHER): Payer: No Typology Code available for payment source | Admitting: Psychology

## 2016-08-27 DIAGNOSIS — F4320 Adjustment disorder, unspecified: Secondary | ICD-10-CM

## 2016-08-30 DIAGNOSIS — D1801 Hemangioma of skin and subcutaneous tissue: Secondary | ICD-10-CM | POA: Diagnosis not present

## 2016-08-30 DIAGNOSIS — D485 Neoplasm of uncertain behavior of skin: Secondary | ICD-10-CM | POA: Diagnosis not present

## 2016-08-30 DIAGNOSIS — Z85828 Personal history of other malignant neoplasm of skin: Secondary | ICD-10-CM | POA: Diagnosis not present

## 2016-08-30 DIAGNOSIS — D225 Melanocytic nevi of trunk: Secondary | ICD-10-CM | POA: Diagnosis not present

## 2016-09-02 ENCOUNTER — Other Ambulatory Visit: Payer: Self-pay | Admitting: Emergency Medicine

## 2016-09-10 ENCOUNTER — Telehealth: Payer: Self-pay | Admitting: Emergency Medicine

## 2016-09-10 DIAGNOSIS — H1032 Unspecified acute conjunctivitis, left eye: Secondary | ICD-10-CM | POA: Diagnosis not present

## 2016-09-10 NOTE — Telephone Encounter (Signed)
Spoke with pt. States that she does not have a follow up with RB in September. She is wanting to know if she should continue Eliquis until then. Advised her that she should continue taking this until her upcoming appointment. Nothing further was needed.

## 2016-09-10 NOTE — Telephone Encounter (Signed)
Left message advising that prolia should not cause pain at injection site, should not make you feel sick and there is no need to rest or change any of your daily activities---any further questions, can talk with Kristen Castillo

## 2016-09-10 NOTE — Telephone Encounter (Signed)
Patient has sent up an appointment for 7/9 @9am . She has some questions. She is wanting to know if she is going to hurting during the day. If she will be sick all day. If she will need to rest. She is worried about these things. She states if she does not answer she asked for you to leave a VM with this information. Thank you.

## 2016-09-16 ENCOUNTER — Ambulatory Visit (INDEPENDENT_AMBULATORY_CARE_PROVIDER_SITE_OTHER): Payer: Medicare Other

## 2016-09-16 ENCOUNTER — Ambulatory Visit: Payer: Self-pay

## 2016-09-16 DIAGNOSIS — M810 Age-related osteoporosis without current pathological fracture: Secondary | ICD-10-CM

## 2016-09-16 MED ORDER — DENOSUMAB 60 MG/ML ~~LOC~~ SOLN
60.0000 mg | Freq: Once | SUBCUTANEOUS | Status: AC
  Administered 2016-09-16: 60 mg via SUBCUTANEOUS

## 2016-10-01 ENCOUNTER — Ambulatory Visit (INDEPENDENT_AMBULATORY_CARE_PROVIDER_SITE_OTHER): Payer: No Typology Code available for payment source | Admitting: Psychology

## 2016-10-01 DIAGNOSIS — F4320 Adjustment disorder, unspecified: Secondary | ICD-10-CM | POA: Diagnosis not present

## 2016-10-02 ENCOUNTER — Other Ambulatory Visit (INDEPENDENT_AMBULATORY_CARE_PROVIDER_SITE_OTHER): Payer: Medicare Other

## 2016-10-02 ENCOUNTER — Ambulatory Visit (INDEPENDENT_AMBULATORY_CARE_PROVIDER_SITE_OTHER): Payer: Medicare Other | Admitting: Internal Medicine

## 2016-10-02 ENCOUNTER — Encounter: Payer: Self-pay | Admitting: Internal Medicine

## 2016-10-02 DIAGNOSIS — I2692 Saddle embolus of pulmonary artery without acute cor pulmonale: Secondary | ICD-10-CM | POA: Diagnosis not present

## 2016-10-02 DIAGNOSIS — M818 Other osteoporosis without current pathological fracture: Secondary | ICD-10-CM

## 2016-10-02 DIAGNOSIS — D509 Iron deficiency anemia, unspecified: Secondary | ICD-10-CM

## 2016-10-02 DIAGNOSIS — F439 Reaction to severe stress, unspecified: Secondary | ICD-10-CM

## 2016-10-02 DIAGNOSIS — E559 Vitamin D deficiency, unspecified: Secondary | ICD-10-CM

## 2016-10-02 LAB — CBC WITH DIFFERENTIAL/PLATELET
Basophils Absolute: 0.1 10*3/uL (ref 0.0–0.1)
Basophils Relative: 1.4 % (ref 0.0–3.0)
EOS PCT: 2 % (ref 0.0–5.0)
Eosinophils Absolute: 0.1 10*3/uL (ref 0.0–0.7)
HCT: 44.2 % (ref 36.0–46.0)
HEMOGLOBIN: 14.6 g/dL (ref 12.0–15.0)
Lymphocytes Relative: 24.3 % (ref 12.0–46.0)
Lymphs Abs: 1.8 10*3/uL (ref 0.7–4.0)
MCHC: 33.1 g/dL (ref 30.0–36.0)
MCV: 90.9 fl (ref 78.0–100.0)
MONO ABS: 0.5 10*3/uL (ref 0.1–1.0)
MONOS PCT: 6.7 % (ref 3.0–12.0)
Neutro Abs: 4.9 10*3/uL (ref 1.4–7.7)
Neutrophils Relative %: 65.6 % (ref 43.0–77.0)
Platelets: 283 10*3/uL (ref 150.0–400.0)
RBC: 4.86 Mil/uL (ref 3.87–5.11)
RDW: 14.4 % (ref 11.5–15.5)
WBC: 7.4 10*3/uL (ref 4.0–10.5)

## 2016-10-02 LAB — BASIC METABOLIC PANEL
BUN: 27 mg/dL — AB (ref 6–23)
CALCIUM: 9.2 mg/dL (ref 8.4–10.5)
CO2: 26 meq/L (ref 19–32)
CREATININE: 0.98 mg/dL (ref 0.40–1.20)
Chloride: 108 mEq/L (ref 96–112)
GFR: 58.67 mL/min — AB (ref >60.00)
GLUCOSE: 104 mg/dL — AB (ref 70–99)
Potassium: 4.2 mEq/L (ref 3.5–5.1)
SODIUM: 139 meq/L (ref 135–145)

## 2016-10-02 MED ORDER — LORAZEPAM 0.5 MG PO TABS
0.5000 mg | ORAL_TABLET | Freq: Two times a day (BID) | ORAL | 1 refills | Status: DC | PRN
Start: 2016-10-02 — End: 2017-03-17

## 2016-10-02 MED ORDER — ZOSTER VAC RECOMB ADJUVANTED 50 MCG/0.5ML IM SUSR: 0.5000 mL | Freq: Once | INTRAMUSCULAR | 1 refills | Status: AC

## 2016-10-02 NOTE — Assessment & Plan Note (Signed)
Prolia 2018

## 2016-10-02 NOTE — Assessment & Plan Note (Signed)
Valerian prn Lorazepam prn

## 2016-10-02 NOTE — Assessment & Plan Note (Signed)
CBC

## 2016-10-02 NOTE — Assessment & Plan Note (Signed)
On Vit D 

## 2016-10-02 NOTE — Assessment & Plan Note (Signed)
On Eliquis F/u w/Dr Lamonte Sakai

## 2016-10-02 NOTE — Patient Instructions (Signed)
Valerian root as needed

## 2016-10-02 NOTE — Progress Notes (Signed)
Subjective:  Patient ID: Kristen Castillo, female    DOB: 05-27-1940  Age: 75 y.o. MRN: [id number]  CC: Follow-up   HPI Kristen Castillo presents for PE, osteoporosis, wt loss  C/o stress - kid's divorce    Outpatient Medications Prior to Visit  Medication Sig Dispense Refill  . budesonide (RHINOCORT ALLERGY) 32 MCG/ACT nasal spray Place 1 spray into both nostrils daily.    . calcium carbonate (OS-CAL) 600 MG TABS tablet Take 600 mg by mouth 2 (two) times daily with a meal. Reported on 08/29/2015    . Cholecalciferol 1000 UNITS tablet Take 2,000 Units by mouth 2 (two) times daily. Reported on 08/29/2015    . ELIQUIS 5 MG TABS tablet TAKE 1 TABLET TWICE DAILY. 60 tablet 2  . Multiple Vitamin (MULTIVITAMIN) tablet Take 1 tablet by mouth daily. Reported on 08/29/2015    . MEGARED OMEGA-3 KRILL OIL 500 MG CAPS Take 1 capsule by mouth every morning. (Patient not taking: Reported on 10/02/2016) 100 capsule 3   No facility-administered medications prior to visit.     ROS Review of Systems  Constitutional: Negative for activity change, appetite change, chills, fatigue and unexpected weight change.  HENT: Negative for congestion, mouth sores and sinus pressure.   Eyes: Negative for visual disturbance.  Respiratory: Negative for cough and chest tightness.   Gastrointestinal: Negative for abdominal pain and nausea.  Genitourinary: Negative for difficulty urinating, frequency and vaginal pain.  Musculoskeletal: Negative for back pain and gait problem.  Skin: Negative for pallor and rash.  Neurological: Negative for dizziness, tremors, weakness, numbness and headaches.  Psychiatric/Behavioral: Negative for confusion and sleep disturbance. The patient is nervous/anxious.     Objective:  BP (!) 144/80 (BP Location: Left Arm, Patient Position: Sitting, Cuff Size: Normal)   Pulse 71   Temp 97.8 F (36.6 C) (Oral)   Resp 12   Ht 5\' 2"  (1.575 m)   Wt 122 lb (55.3 kg)   SpO2 97%   BMI 22.31  kg/m   BP Readings from Last 3 Encounters:  10/02/16 (!) 144/80  08/08/16 118/76  06/12/16 108/64    Wt Readings from Last 3 Encounters:  10/02/16 122 lb (55.3 kg)  08/08/16 122 lb (55.3 kg)  06/12/16 122 lb (55.3 kg)    Physical Exam  Constitutional: She appears well-developed. No distress.  HENT:  Head: Normocephalic.  Right Ear: External ear normal.  Left Ear: External ear normal.  Nose: Nose normal.  Mouth/Throat: Oropharynx is clear and moist.  Eyes: Pupils are equal, round, and reactive to light. Conjunctivae are normal. Right eye exhibits no discharge. Left eye exhibits no discharge.  Neck: Normal range of motion. Neck supple. No JVD present. No tracheal deviation present. No thyromegaly present.  Cardiovascular: Normal rate, regular rhythm and normal heart sounds.   Pulmonary/Chest: No stridor. No respiratory distress. She has no wheezes.  Abdominal: Soft. Bowel sounds are normal. She exhibits no distension and no mass. There is no tenderness. There is no rebound and no guarding.  Musculoskeletal: She exhibits no edema or tenderness.  Lymphadenopathy:    She has no cervical adenopathy.  Neurological: She displays normal reflexes. No cranial nerve deficit. She exhibits normal muscle tone. Coordination normal.  Skin: No rash noted. No erythema.  Psychiatric: She has a normal mood and affect. Her behavior is normal. Judgment and thought content normal.  tearful  Lab Results  Component Value Date   WBC 7.1 06/04/2016   HGB 14.2 06/04/2016  HCT 42.4 06/04/2016   PLT 331.0 06/04/2016   GLUCOSE 76 06/04/2016   CHOL 210 (H) 02/07/2016   TRIG 119.0 02/07/2016   HDL 56.30 02/07/2016   LDLCALC 130 (H) 02/07/2016   ALT 9 06/04/2016   AST 15 06/04/2016   NA 139 06/04/2016   K 3.7 06/04/2016   CL 106 06/04/2016   CREATININE 0.81 06/04/2016   BUN 22 06/04/2016   CO2 26 06/04/2016   TSH 2.08 06/04/2016   INR 1.54 (H) 08/25/2015   HGBA1C 6.0 (H) 11/03/2014    No  results found.  Assessment & Plan:   There are no diagnoses linked to this encounter. I am having Ms. Birkhead maintain her Cholecalciferol, calcium carbonate, multivitamin, budesonide, MEGARED OMEGA-3 KRILL OIL, and ELIQUIS.  No orders of the defined types were placed in this encounter.    Follow-up: No Follow-up on file.  Walker Kehr, MD

## 2016-10-20 NOTE — Progress Notes (Signed)
I was available to supervise the injection. A. Tejuan Gholson, MD  

## 2016-11-08 ENCOUNTER — Ambulatory Visit: Payer: Self-pay | Admitting: Emergency Medicine

## 2016-11-10 ENCOUNTER — Other Ambulatory Visit: Payer: Self-pay | Admitting: Emergency Medicine

## 2016-11-12 ENCOUNTER — Ambulatory Visit: Payer: No Typology Code available for payment source | Admitting: Psychology

## 2016-11-13 ENCOUNTER — Ambulatory Visit (INDEPENDENT_AMBULATORY_CARE_PROVIDER_SITE_OTHER): Payer: No Typology Code available for payment source | Admitting: Psychology

## 2016-11-13 DIAGNOSIS — F4320 Adjustment disorder, unspecified: Secondary | ICD-10-CM | POA: Diagnosis not present

## 2016-11-20 ENCOUNTER — Telehealth: Payer: Self-pay | Admitting: Emergency Medicine

## 2016-11-20 ENCOUNTER — Ambulatory Visit (INDEPENDENT_AMBULATORY_CARE_PROVIDER_SITE_OTHER): Payer: Medicare Other | Admitting: Emergency Medicine

## 2016-11-20 ENCOUNTER — Encounter: Payer: Self-pay | Admitting: Emergency Medicine

## 2016-11-20 VITALS — BP 130/76 | HR 86 | Ht 62.25 in | Wt 127.0 lb

## 2016-11-20 DIAGNOSIS — I2602 Saddle embolus of pulmonary artery with acute cor pulmonale: Secondary | ICD-10-CM | POA: Diagnosis not present

## 2016-11-20 MED ORDER — APIXABAN 2.5 MG PO TABS: 2.5000 mg | ORAL_TABLET | Freq: Two times a day (BID) | ORAL | 2 refills | Status: DC

## 2016-11-20 NOTE — Progress Notes (Signed)
Subjective:    Patient ID: Kristen Castillo, female    DOB: [birth year], 76 y.o.   MRN: [id number]  HPI 76 year old never smoker known to me from recent hospitalization. 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 She had several abdominal hernias and required mesh placement in May 2017. I met her in June 2017 after she was admitted for a large right lower extremity DVT and associated saddle pulmonary embolism. She underwent targeted lytic therapy and was admitted to the ICU. She recovered well without any evidence of hypoxemia. Her echocardiogram initially showed evidence of right heart strain. She was discharged on Eliquis. She has been having cough since discharge home. Clear mucous. She has been on loratadine, rhinocort, restarted delsym and tessalon perles for a few months. She is on protonix qd.   ROV 10/17/15 -- This a follow-up visit for history of saddle pulmonary embolism that required targeted lysis in the ICU. She was discharged on Eliquis. She also has a history of chronic cough that appears to be associated with chronic rhinitis and GERD. At her last visit we increased her Protonix temporarily to see if she would benefit, continued her allergy regimen. Her cough is now better.  She remains on eliquis.   ROV 02/27/16 -- Patient has a history of a large saddle pulmonary embolism with hemodynamic compromise and targeted lysis in the ICU performed in July 2017. She has been on Eliquis since then. She also has a history of chronic cough with impact from GERD and chronic rhinitis. Her cough appears to be be well managed. She is about to start fish oil.   ROV 11/19/16 --  This follow-up visit for patient who was hospitalized in the setting of a large saddle pulmonary embolism with hemodynamic compromise. She had targeted lysis. She has been on Eliquis since.  Tolerated well. No bleeding events. Her breathing has been doing  well.     Review of Systems As per HPI   Past Medical History:  Diagnosis Date  . Anxiety   . Cancer ([place])    hx skin cancer  . Complication of anesthesia   . Congestion of both ears   . Deviated nasal septum   . Diverticulitis   . DVT (deep venous thrombosis) ([place])   . GERD (gastroesophageal reflux disease)   . Hair loss   . Hematuria    in past  . History of creation of ostomy [facility])    past partial colectomy in 05/03/14  . History of skin cancer   . Kidney cysts   . Migraines    in past  . Molar pregnancy   . Osteoarthritis    fingers   . Osteoporosis   . Osteoporosis   . Persistent cough   . Pneumonia    july 2016, pneumonia-06/2015   . PONV (postoperative nausea and vomiting)   . Unspecified vitamin D deficiency   . Ventral hernia      Family History  Problem Relation Age of Onset  . Arthritis Mother   . Colon cancer Mother 79  . Heart disease Father   . Dementia Father   . Diabetes Sister   . Cancer Maternal Grandfather        Lung cancer  . Heart disease Maternal Grandmother   . Stomach cancer Neg Hx      Social History   Social History  . Marital status: Married    Spouse name: N/A  .  Number of children: N/A  . Years of education: N/A   Occupational History  . Not on file.   Social History Main Topics  . Smoking status: Never Smoker  . Smokeless tobacco: Never Used  . Alcohol use No  . Drug use: No  . Sexual activity: Not Currently   Other Topics Concern  . 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 Codeine Nausea And Vomiting  . Dilaudid [Hydromorphone Hcl] Other (See Comments)    "too strong - I can't wake up"  . Sulfonamide Derivatives Other (See Comments)    Childhood allergy; reaction unknown  . Tape  Other (See Comments)    REDDNESS, BLISTER/  Use paper tape  . Norco [Hydrocodone-Acetaminophen] Rash     Outpatient Medications Prior to Visit  Medication Sig Dispense Refill  . budesonide (RHINOCORT ALLERGY) 32 MCG/ACT nasal spray Place 1 spray into both nostrils daily.    . calcium carbonate (OS-CAL) 600 MG TABS tablet Take 600 mg by mouth 2 (two) times daily with a meal. Reported on 08/29/2015    . Cholecalciferol 1000 UNITS tablet Take 2,000 Units by mouth 2 (two) times daily. Reported on 08/29/2015    . ELIQUIS 5 MG TABS tablet TAKE 1 TABLET TWICE DAILY. 60 tablet 2  . LORazepam (ATIVAN) 0.5 MG tablet Take 1 tablet (0.5 mg total) by mouth 2 (two) times daily as needed for anxiety. 30 tablet 1  . Multiple Vitamin (MULTIVITAMIN) tablet Take 1 tablet by mouth daily. Reported on 08/29/2015    . MEGARED OMEGA-3 KRILL OIL 500 MG CAPS Take 1 capsule by mouth every morning. (Patient not taking: Reported on 10/02/2016) 100 capsule 3   No facility-administered medications prior to visit.         Objective:   Physical Exam Vitals:   11/20/16 1337 11/20/16 1338  BP:  130/76  Pulse:  86  SpO2:  98%  Weight: 127 lb (57.6 kg)   Height: 5' 2.25" (1.581 m)     Gen: Pleasant, well-nourished, in no distress,  normal affect  ENT: No lesions,  mouth clear,  oropharynx clear, no postnasal drip  Neck: No JVD, no TMG, no carotid bruits  Lungs: No use of accessory muscles, small breaths, no wheeze.   Cardiovascular: RRR, heart sounds normal, no murmur or gallops, R > L LE edema  Musculoskeletal: No deformities, no cyanosis or clubbing  Neuro: alert, non focal  Skin: Warm, no lesions or rashes      Assessment & Plan:  Pulmonary embolism (HCC)  We will plan to change her to prophylactic dose Eliquis afte r15 months treatment. Her clot was provoked, so we could revisoit in the future, consider stopping altoigether,.   We will change your Eliquis to 2.74m twice a day  We can revisit whether  we will stay on this medication indefinitely as we go forward.  Follow with Dr BLamonte Sakaiin 6 months or sooner if you have any problems  RBaltazar Apo MD, PhD 11/20/2016, 2:22 PM [facility] [phone] if no answer [phone]

## 2016-11-20 NOTE — Patient Instructions (Addendum)
We will change your Eliquis to 2.5mg  twice a day  We can revisit whether we will stay on this medication indefinitely as we go forward.  Follow with Dr Lamonte Sakai in 6 months or sooner if you have any problems

## 2016-11-20 NOTE — Assessment & Plan Note (Signed)
We will plan to change her to prophylactic dose Eliquis afte r15 months treatment. Her clot was provoked, so we could revisoit in the future, consider stopping altoigether,.   We will change your Eliquis to 2.5mg  twice a day  We can revisit whether we will stay on this medication indefinitely as we go forward.  Follow with Dr Lamonte Sakai in 6 months or sooner if you have any problems

## 2016-11-21 MED ORDER — APIXABAN 2.5 MG PO TABS: 2.5000 mg | ORAL_TABLET | Freq: Two times a day (BID) | ORAL | 5 refills | Status: DC

## 2016-11-21 NOTE — Telephone Encounter (Signed)
When pt saw RB yesterday, her Eliquis was changed. This prescription was not sent in. This has been taken care of. Pt is aware and I apologized for our not taking care of this yesterday. Nothing further was needed.

## 2016-11-28 ENCOUNTER — Encounter: Payer: Self-pay | Admitting: Gynecology

## 2016-11-28 ENCOUNTER — Ambulatory Visit (INDEPENDENT_AMBULATORY_CARE_PROVIDER_SITE_OTHER): Payer: Medicare Other | Admitting: Gynecology

## 2016-11-28 VITALS — BP 120/70 | Ht 62.0 in | Wt 127.0 lb

## 2016-11-28 DIAGNOSIS — Z8759 Personal history of other complications of pregnancy, childbirth and the puerperium: Secondary | ICD-10-CM | POA: Diagnosis not present

## 2016-11-28 DIAGNOSIS — Z01411 Encounter for gynecological examination (general) (routine) with abnormal findings: Secondary | ICD-10-CM

## 2016-11-28 DIAGNOSIS — N952 Postmenopausal atrophic vaginitis: Secondary | ICD-10-CM

## 2016-11-28 DIAGNOSIS — O020 Blighted ovum and nonhydatidiform mole: Secondary | ICD-10-CM

## 2016-11-28 DIAGNOSIS — M810 Age-related osteoporosis without current pathological fracture: Secondary | ICD-10-CM

## 2016-11-28 NOTE — Patient Instructions (Signed)
Call to Schedule your mammogram  Facilities in [place]: 1)  [facility]. [facility], [address] Phone: [phone] 2)  Dr. Bertrand at [facility]  [address] Phone: [phone]     Mammogram A mammogram is an X-ray test to find changes in a woman's breast. You should get a mammogram if:  You are 76 years of age or older  You have risk factors.   Your doctor recommends that you have one.  BEFORE THE TEST  Do not schedule the test the week before your period, especially if your breasts are sore during this time.  On the day of your mammogram:  Wash your breasts and armpits well. After washing, do not put on any deodorant or talcum powder on until after your test.   Eat and drink as you usually do.   Take your medicines as usual.   If you are diabetic and take insulin, make sure you:   Eat before coming for your test.   Take your insulin as usual.   If you cannot keep your appointment, call before the appointment to cancel. Schedule another appointment.  TEST  You will need to undress from the waist up. You will put on a hospital gown.   Your breast will be put on the mammogram machine, and it will press firmly on your breast with a piece of plastic called a compression paddle. This will make your breast flatter so that the machine can X-ray all parts of your breast.   Both breasts will be X-rayed. Each breast will be X-rayed from above and from the side. An X-ray might need to be taken again if the picture is not good enough.   The mammogram will last about 15 to 30 minutes.  AFTER THE TEST Finding out the results of your test Ask when your test results will be ready. Make sure you get your test results.  Document Released: 05/24/2008 Document Revised: 02/14/2011 Document Reviewed: 05/24/2008 ExitCare Patient Information 2012 [employer].   

## 2016-11-28 NOTE — Progress Notes (Signed)
    Kristen Castillo 1941-01-27 [id number]        75 y.o.  G1P0010 for breast and pelvic exam  Past medical history,surgical history, problem list, medications, allergies, family history and social history were all reviewed and documented as reviewed in the EPIC chart.  ROS:  Performed with pertinent positives and negatives included in the history, assessment and plan.   Additional significant findings :  None   Exam: [name] assistant Vitals:   11/28/16 0845  BP: 120/70  Weight: 127 lb (57.6 kg)  Height: 5\' 2"  (1.575 m)   Body mass index is 23.23 kg/m.  General appearance:  Normal affect, orientation and appearance. Skin: Grossly normal HEENT: Without gross lesions.  No cervical or supraclavicular adenopathy. Thyroid normal.  Lungs:  Clear without wheezing, rales or rhonchi Cardiac: RR, without RMG Abdominal:  Soft, nontender, without masses, guarding, rebound, organomegaly or hernia Breasts:  Examined lying and sitting without masses, retractions, discharge or axillary adenopathy.  Well-healed reduction scars bilaterally Pelvic:  Ext, BUS, Vagina: With atrophic changes  Adnexa: Without masses or tenderness    Anus and perineum: Normal   Rectovaginal: Normal sphincter tone without palpated masses or tenderness.    Assessment/Plan:  76 y.o. G49P0010 female for breast and pelvic exam.   1. Postmenopausal/atrophic genital changes. History of molar pregnancy with ultimate hysterectomy 1972. Subsequent BSO for benign ovarian cysts for years later. Having some dyspareunia but using OTC lubricants which is acceptable to her. No significant hot flushes or sweats. 2. History of molar pregnancy a number of years ago. Has had negative hCG follow up's. She continues to take quantitative hCGs annually which makes her feel better. Quantitative hCG today. 3. Pap smear reported 2016. No Pap smear done today. No history of abnormal Pap smears. Reviewed current screening guidelines. Options  to stop screening based on age and hysterectomy history reviewed. Will readdress on annual basis.  4. Mammography due now and patients going to call to schedule. Names and numbers provided. Breast exam normal today. 5. Colonoscopy 2016. Repeat at their recommended interval. 6. Osteoporosis. DEXA 03/2016 T score -2.7. Currently on Forteo and followed by Dr. Alain Marion. 7. Health maintenance. No routine lab work done as patient does this at Dr. Judeen Hammans office. Follow up 1 year, sooner as needed.    Anastasio Auerbach MD, 9:16 AM 11/28/2016

## 2016-11-29 ENCOUNTER — Other Ambulatory Visit: Payer: Self-pay | Admitting: Gynecology

## 2016-11-29 DIAGNOSIS — Z1231 Encounter for screening mammogram for malignant neoplasm of breast: Secondary | ICD-10-CM

## 2016-11-29 LAB — HCG, QUANTITATIVE, PREGNANCY: HCG, Total, QN: 5 m[IU]/mL

## 2016-12-04 DIAGNOSIS — Z85828 Personal history of other malignant neoplasm of skin: Secondary | ICD-10-CM | POA: Diagnosis not present

## 2016-12-04 DIAGNOSIS — L821 Other seborrheic keratosis: Secondary | ICD-10-CM | POA: Diagnosis not present

## 2016-12-04 DIAGNOSIS — D1801 Hemangioma of skin and subcutaneous tissue: Secondary | ICD-10-CM | POA: Diagnosis not present

## 2016-12-04 DIAGNOSIS — L723 Sebaceous cyst: Secondary | ICD-10-CM | POA: Diagnosis not present

## 2016-12-12 ENCOUNTER — Ambulatory Visit
Admission: RE | Admit: 2016-12-12 | Discharge: 2016-12-12 | Disposition: A | Discharge status: Home / Self Care | Payer: Medicare Other | Source: Ambulatory Visit | Attending: Gynecology | Admitting: Gynecology

## 2016-12-12 DIAGNOSIS — Z1231 Encounter for screening mammogram for malignant neoplasm of breast: Secondary | ICD-10-CM | POA: Diagnosis not present

## 2016-12-16 DIAGNOSIS — H0014 Chalazion left upper eyelid: Secondary | ICD-10-CM | POA: Diagnosis not present

## 2016-12-23 ENCOUNTER — Ambulatory Visit (INDEPENDENT_AMBULATORY_CARE_PROVIDER_SITE_OTHER): Payer: No Typology Code available for payment source | Admitting: Psychology

## 2016-12-23 DIAGNOSIS — F4320 Adjustment disorder, unspecified: Secondary | ICD-10-CM | POA: Diagnosis not present

## 2017-01-01 ENCOUNTER — Ambulatory Visit (INDEPENDENT_AMBULATORY_CARE_PROVIDER_SITE_OTHER): Payer: Medicare Other

## 2017-01-01 DIAGNOSIS — Z23 Encounter for immunization: Secondary | ICD-10-CM

## 2017-01-10 DIAGNOSIS — H2000 Unspecified acute and subacute iridocyclitis: Secondary | ICD-10-CM | POA: Diagnosis not present

## 2017-01-17 DIAGNOSIS — H2000 Unspecified acute and subacute iridocyclitis: Secondary | ICD-10-CM | POA: Diagnosis not present

## 2017-02-04 ENCOUNTER — Other Ambulatory Visit: Payer: Self-pay

## 2017-02-04 ENCOUNTER — Ambulatory Visit: Payer: Medicare Other | Admitting: Physician Assistant

## 2017-02-04 ENCOUNTER — Ambulatory Visit (INDEPENDENT_AMBULATORY_CARE_PROVIDER_SITE_OTHER): Payer: Medicare Other

## 2017-02-04 ENCOUNTER — Encounter: Payer: Self-pay | Admitting: Physician Assistant

## 2017-02-04 VITALS — BP 140/78 | HR 98 | Temp 98.3°F | Resp 16 | Ht 62.0 in | Wt 129.0 lb

## 2017-02-04 DIAGNOSIS — R059 Cough, unspecified: Secondary | ICD-10-CM

## 2017-02-04 DIAGNOSIS — R05 Cough: Secondary | ICD-10-CM | POA: Diagnosis not present

## 2017-02-04 DIAGNOSIS — J189 Pneumonia, unspecified organism: Secondary | ICD-10-CM | POA: Diagnosis not present

## 2017-02-04 LAB — POCT CBC
Granulocyte percent: 80.1 %G — AB (ref 37–80)
HCT, POC: 43.1 % (ref 37.7–47.9)
HEMOGLOBIN: 14.2 g/dL (ref 12.2–16.2)
LYMPH, POC: 1.4 (ref 0.6–3.4)
MCH: 29.6 pg (ref 27–31.2)
MCHC: 33 g/dL (ref 31.8–35.4)
MCV: 89.8 fL (ref 80–97)
MID (CBC): 0.3 (ref 0–0.9)
MPV: 7.5 fL (ref 0–99.8)
PLATELET COUNT, POC: 235 10*3/uL (ref 142–424)
POC Granulocyte: 6.7 (ref 2–6.9)
POC LYMPH PERCENT: 16.1 %L (ref 10–50)
POC MID %: 3.8 %M (ref 0–12)
RBC: 4.79 M/uL (ref 4.04–5.48)
RDW, POC: 13.8 %
WBC: 8.4 10*3/uL (ref 4.6–10.2)

## 2017-02-04 MED ORDER — AZITHROMYCIN 250 MG PO TABS: 500.0000 mg | ORAL_TABLET | Freq: Every day | ORAL | 0 refills | Status: DC

## 2017-02-04 NOTE — Progress Notes (Signed)
02/04/2017 5:18 PM   DOB: [birth year] / MRN: [id number]  SUBJECTIVE:  Kristen Castillo is a 76 y.o. female presenting for isolated cough that started about 10 days ago.  Tells me she had pneumonia in both 2016 and 2017.  Has a history of provoked DVT and is now taking elloquis withou missing doses.  No history of asthma.  No night sweats or recent travel. History of GERD treated at one time but now asymptomatic.  Has cough about this time every year. Normal echo obtained in 08/23/15 2/2 pulmonary embolus.   She is allergic to codeine; dilaudid [hydromorphone hcl]; sulfonamide derivatives; tape; and norco [hydrocodone-acetaminophen].   She  has a past medical history of Anxiety, Cancer ([place]), Complication of anesthesia, Congestion of both ears, Deviated nasal septum, Diverticulitis, DVT (deep venous thrombosis) ([place]), GERD (gastroesophageal reflux disease), Hair loss, Hematuria, History of creation of ostomy ([place]), History of skin cancer, Kidney cysts, Migraines, Molar pregnancy, Molar pregnancy, Osteoarthritis, Osteoporosis, Osteoporosis, Persistent cough, Pneumonia, PONV (postoperative nausea and vomiting), Unspecified vitamin D deficiency, and Ventral hernia.    She  reports that  has never smoked. she has never used smokeless tobacco. She reports that she does not drink alcohol or use drugs. She  reports that she currently engages in sexual activity. The patient  has a past surgical history that includes Abdominal hysterectomy (1972); Oophorectomy (1976); Tonsillectomy; Lipoma resection; Reduction mammaplasty (1994); Rhinoplasty (1985); Facial cosmetic surgery (2009); Laparoscopic partial colectomy (N/A, 05/03/2014); laparotomy (N/A, 05/03/2014); Ileo loop colostomy closure (N/A, 11/08/2014); Laparoscopic lysis of adhesions (N/A, 11/08/2014); Ventral hernia repair (N/A, 08/01/2015); Insertion of mesh (N/A, 08/01/2015); and Laparoscopic lysis of adhesions (N/A, 08/01/2015).  Her family history includes  Arthritis in her mother; Cancer in her maternal grandfather; Colon cancer (age of onset: 34) in her mother; Dementia in her father; Diabetes in her sister; Heart disease in her father and maternal grandmother.  Review of Systems  Constitutional: Negative for chills, diaphoresis and fever.  Respiratory: Negative for shortness of breath.   Cardiovascular: Negative for chest pain, orthopnea and leg swelling.  Gastrointestinal: Negative for nausea.  Skin: Negative for rash.  Neurological: Negative for dizziness.    The problem list and medications were reviewed and updated by myself where necessary and exist elsewhere in the encounter.   OBJECTIVE:  BP 140/78 (BP Location: Right Arm, Patient Position: Sitting, Cuff Size: Normal)   Pulse 98   Temp 98.3 F (36.8 C) (Oral)   Resp 16   Ht 5\' 2"  (1.575 m)   Wt 129 lb (58.5 kg)   SpO2 94%   BMI 23.59 kg/m   BP Readings from Last 3 Encounters:  02/04/17 140/78  11/28/16 120/70  11/20/16 130/76   Pulse Readings from Last 3 Encounters:  02/04/17 98  11/20/16 86  10/02/16 71     Physical Exam  Constitutional: She is oriented to person, place, and time. She appears well-developed and well-nourished.  HENT:  Mouth/Throat: No oropharyngeal exudate.  Cardiovascular: Normal rate and regular rhythm.  Pulmonary/Chest: Effort normal and breath sounds normal. No respiratory distress. She has no wheezes. She has no rales. She exhibits no tenderness.  Musculoskeletal: Normal range of motion.  Neurological: She is alert and oriented to person, place, and time.  Skin: Skin is warm and dry.   Lab Results  Component Value Date   CREATININE 0.98 10/02/2016    Results for orders placed or performed in visit on 02/04/17 (from the past 72 hour(s))  POCT CBC  Status: Abnormal   Collection Time: 02/04/17  4:38 PM  Result Value Ref Range   WBC 8.4 4.6 - 10.2 K/uL   Lymph, poc 1.4 0.6 - 3.4   POC LYMPH PERCENT 16.1 10 - 50 %L   MID (cbc)  0.3 0 - 0.9   POC MID % 3.8 0 - 12 %M   POC Granulocyte 6.7 2 - 6.9   Granulocyte percent 80.1 (A) 37 - 80 %G   RBC 4.79 4.04 - 5.48 M/uL   Hemoglobin 14.2 12.2 - 16.2 g/dL   HCT, POC 43.1 37.7 - 47.9 %   MCV 89.8 80 - 97 fL   MCH, POC 29.6 27 - 31.2 pg   MCHC 33.0 31.8 - 35.4 g/dL   RDW, POC 13.8 %   Platelet Count, POC 235 142 - 424 K/uL   MPV 7.5 0 - 99.8 fL    Dg Chest 2 View  Result Date: 02/04/2017 CLINICAL DATA:  Cough, history of pneumonia EXAM: CHEST  2 VIEW COMPARISON:  Chest x-ray of 07/26/2015 FINDINGS: On the lateral view there is parenchymal opacity extending posteriorly into lower lobe suspicious for a focus of pneumonia. This area is not as well seen on the frontal view. No definite pleural effusion is seen. Mediastinal and hilar contours are unremarkable. The heart is within normal limits in size. No bony abnormality is seen. IMPRESSION: Focal opacity posteriorly on the lateral view in a lower lobe suspicious for pneumonia, not well seen on the frontal view appear Electronically Signed   By: Ivar Drape M.D.   On: 02/04/2017 16:25    ASSESSMENT AND PLAN:  Kristen Castillo was seen today for cough.  Diagnoses and all orders for this visit:  Cough -     DG Chest 2 View; Future -     POCT CBC  Pneumonia due to infectious organism, unspecified laterality, unspecified part of lung: Exam benign.  Vitals mildly abnormal for her. CBC notable for mild leukocytosis.  All signs point to an early pneumonia. Will treat with zpack at pneumonia dosing as no history of QT prolongation and excellent kidney function.   Is taking factor 10 inhibitor and not missing doses.   -     BUN -     azithromycin (ZITHROMAX) 250 MG tablet; Take 2 tablets (500 mg total) by mouth daily.  Other orders -     Cancel: DG Chest 2 View    The patient is advised to call or return to clinic if she does not see an improvement in symptoms, or to seek the care of the closest emergency department if she worsens  with the above plan.   Kristen Castillo, MHS, PA-C [facility] 02/04/2017 5:18 PM

## 2017-02-04 NOTE — Patient Instructions (Addendum)
You have an early pneumonia.  I am glad you came in today.  I would like for you to come to our Saturday clinic for a recheck.  If you are not feeling better at anytime then please come back here, or go to the [facility], or go directly to the ED.   Dg Chest 2 View  Result Date: 02/04/2017 CLINICAL DATA:  Cough, history of pneumonia EXAM: CHEST  2 VIEW COMPARISON:  Chest x-ray of 07/26/2015 FINDINGS: On the lateral view there is parenchymal opacity extending posteriorly into lower lobe suspicious for a focus of pneumonia. This area is not as well seen on the frontal view. No definite pleural effusion is seen. Mediastinal and hilar contours are unremarkable. The heart is within normal limits in size. No bony abnormality is seen. IMPRESSION: Focal opacity posteriorly on the lateral view in a lower lobe suspicious for pneumonia, not well seen on the frontal view appear Electronically Signed   By: Ivar Drape M.D.   On: 02/04/2017 16:25

## 2017-02-05 LAB — BUN: BUN: 26 mg/dL (ref 8–27)

## 2017-02-06 ENCOUNTER — Other Ambulatory Visit: Payer: Self-pay | Admitting: Emergency Medicine

## 2017-02-07 DIAGNOSIS — H2000 Unspecified acute and subacute iridocyclitis: Secondary | ICD-10-CM | POA: Diagnosis not present

## 2017-02-08 ENCOUNTER — Ambulatory Visit: Payer: Medicare Other | Admitting: Osteopathic Medicine

## 2017-02-08 ENCOUNTER — Encounter: Payer: Self-pay | Admitting: Osteopathic Medicine

## 2017-02-08 VITALS — BP 122/76 | HR 81 | Temp 97.3°F | Resp 16 | Ht 62.0 in | Wt 129.0 lb

## 2017-02-08 DIAGNOSIS — R05 Cough: Secondary | ICD-10-CM

## 2017-02-08 DIAGNOSIS — J189 Pneumonia, unspecified organism: Secondary | ICD-10-CM | POA: Diagnosis not present

## 2017-02-08 DIAGNOSIS — R059 Cough, unspecified: Secondary | ICD-10-CM

## 2017-02-08 MED ORDER — BUDESONIDE-FORMOTEROL FUMARATE 80-4.5 MCG/ACT IN AERO: 2.0000 | INHALATION_SPRAY | Freq: Two times a day (BID) | RESPIRATORY_TRACT | 0 refills | Status: DC

## 2017-02-08 MED ORDER — BENZONATATE 200 MG PO CAPS: 200.0000 mg | ORAL_CAPSULE | Freq: Three times a day (TID) | ORAL | 0 refills | Status: DC | PRN

## 2017-02-08 NOTE — Progress Notes (Signed)
HPI: Kristen Castillo is a 76 y.o. female with has a past medical history of Anxiety, Cancer ([place]), Complication of anesthesia, Congestion of both ears, Deviated nasal septum, Diverticulitis, DVT (deep venous thrombosis) ([place]), GERD (gastroesophageal reflux disease), Hair loss, Hematuria, History of creation of ostomy ([place]), History of skin cancer, Kidney cysts, Migraines, Molar pregnancy, Molar pregnancy, Osteoarthritis, Osteoporosis, Osteoporosis, Persistent cough, Pneumonia, PONV (postoperative nausea and vomiting), Unspecified vitamin D deficiency, and Ventral hernia.  who presents to Primary Care at [facility] today, 02/08/17,  for chief complaint of:  Chief Complaint  Patient presents with  . Follow-up    cough seen 11/27, wheezing at night, cough isn't gone      Reviewed [name] PA-C 4 days ago. Reviewed CXR results suspicious for lower lobe PNA on lateral view. CBC not too concerning.Treated for CAP w/ azithromycin.   Overall feeling better, no fever/chills, no shortness of breath.  Coughing is still bothering her especially at night. Hearing a wheezing sound at night.  No choking or apneic episodes.   Patient is accompanied by husband who assists with history-taking.   Past medical, surgical, social and family history reviewed:  Patient Active Problem List   Diagnosis Date Noted  . Carotid bruit 11/07/2015  . Chest pain 08/23/2015  . DVT of leg (deep venous thrombosis) ([place]) 08/23/2015  . Pulmonary embolism ([place]) 08/23/2015  . Ventral hernia s/p laparoscopic repair with mesh 08/01/15 08/01/2015  . Elevated WBC count 07/05/2015  . Hernia of abdominal wall 01/25/2015  . Abdominal pain, epigastric 10/04/2014  . Stress 10/04/2014  . Moderate malnutrition ([place]) 05/24/2014  . Anemia 05/24/2014  . Mild malnutrition ([place]) 05/08/2014  . Abdominal pain   . Dehydration   . Migraine without aura and without status migrainosus, not intractable   . Sigmoid diverticulitis 05/03/2014   . Difficulty tolerating colonoscopy bowel prep 04/29/2014  . Anxiety, mild 04/29/2014  . 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 Diverticulitis of colon with perforation 04/28/2014  . GERD (gastroesophageal reflux disease) 04/28/2014  . Cerumen impaction 03/21/2014  . Cough 03/21/2014  . Well adult exam 03/21/2014  . 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 Rectal discharge 09/07/2011  . Functional constipation 09/07/2011  . Nephrolithiasis 01/08/2011  . Diverticulosis of colon 01/08/2011  . HEMATURIA, MICROSCOPIC, HX OF 01/28/2008  . Vitamin D deficiency 04/01/2007  . HAIR LOSS 04/01/2007  . TREMOR 04/01/2007  . Allergic rhinitis 03/31/2007  . OSTEOARTHRITIS 03/31/2007  . MIGRAINES, HX OF 03/31/2007  . Osteoporosis 11/12/2006    Past Surgical History:  Procedure Laterality Date  . ABDOMINAL HYSTERECTOMY  1972  . FACIAL COSMETIC SURGERY  2009   [name] MD in [place]  . ILEO LOOP COLOSTOMY CLOSURE N/A 11/08/2014   Procedure: LAPAROSCOPIC COLOSTOMY CLOSURE WITH PARTIAL COLECTOMY;  Surgeon: [name], MD;  Location: [facility];  Service: General;  Laterality: N/A;  . INSERTION OF MESH N/A 08/01/2015   Procedure: INSERTION OF MESH;  Surgeon: [name], MD;  Location: [facility];  Service: General;  Laterality: N/A;  . LAPAROSCOPIC LYSIS OF ADHESIONS N/A 11/08/2014   Procedure: LAPAROSCOPIC LYSIS OF ADHESIONS FOR 90 MINUTES;  Surgeon: [name], MD;  Location: [facility];  Service: General;  Laterality: N/A;  . LAPAROSCOPIC LYSIS OF ADHESIONS N/A 08/01/2015   Procedure: LAPAROSCOPIC LYSIS OF ADHESIONS;  Surgeon: [name], MD;  Location: [facility];  Service: General;  Laterality: N/A;  . LAPAROSCOPIC PARTIAL COLECTOMY N/A 05/03/2014   Procedure: DIAGNOSTIC LAPAROSCOPY WITH DRAINAGE OF INTRA ABDOMINAL  ABSCESS PARTIAL COLECTOMY ;  Surgeon: [name], MD;  Location: [facility];  Service: General;  Laterality: N/A;  supine position  . LAPAROTOMY N/A 05/03/2014    Procedure: EXPLORATORY LAPAROTOMY WITH HARTMAN PROCEDURE;  Surgeon: [name], MD;  Location: [facility];  Service: General;  Laterality: N/A;  . LIPOMA RESECTION     left scapula area  . OOPHORECTOMY  1976  . REDUCTION MAMMAPLASTY  1994  . RHINOPLASTY  1985  . TONSILLECTOMY    . VENTRAL HERNIA REPAIR N/A 08/01/2015   Procedure: LAPAROSCOPIC VENTRAL INCISIONAL  HERNIA;  Surgeon: [name], MD;  Location: [facility];  Service: General;  Laterality: N/A;    Social History   Tobacco Use  . Smoking status: Never Smoker  . Smokeless tobacco: Never Used  Substance Use Topics  . Alcohol use: No    Alcohol/week: 0.0 oz    Family History  Problem Relation Age of Onset  . Arthritis Mother   . Colon cancer Mother 31  . Heart disease Father   . Dementia Father   . Diabetes Sister   . Cancer Maternal Grandfather        Lung cancer  . Heart disease Maternal Grandmother   . Stomach cancer Neg Hx      Current medication list and allergy/intolerance information reviewed:    Current Outpatient Medications  Medication Sig Dispense Refill  . azithromycin (ZITHROMAX) 250 MG tablet Take 2 tablets (500 mg total) by mouth daily. (Patient not taking: Reported on 02/08/2017) 6 tablet 0  . budesonide (RHINOCORT ALLERGY) 32 MCG/ACT nasal spray Place 1 spray into both nostrils daily.    . calcium carbonate (OS-CAL) 600 MG TABS tablet Take 600 mg by mouth 2 (two) times daily with a meal. Reported on 08/29/2015    . Cholecalciferol 1000 UNITS tablet Take 2,000 Units by mouth 2 (two) times daily. Reported on 08/29/2015    . denosumab (PROLIA) 60 MG/ML SOLN injection Inject 60 mg into the skin every 6 (six) months. Administer in upper arm, thigh, or abdomen    . ELIQUIS 2.5 MG TABS tablet TAKE 1 TABLET BY MOUTH TWICE DAILY. 60 tablet 2  . LORazepam (ATIVAN) 0.5 MG tablet Take 1 tablet (0.5 mg total) by mouth 2 (two) times daily as needed for anxiety. (Patient not taking: Reported on 02/04/2017) 30 tablet  1  . Multiple Vitamin (MULTIVITAMIN) tablet Take 1 tablet by mouth daily. Reported on 08/29/2015     No current facility-administered medications for this visit.     Allergies  Allergen Reactions  . Codeine Nausea And Vomiting  . Dilaudid [Hydromorphone Hcl] Other (See Comments)    "too strong - I can't wake up"  . Sulfonamide Derivatives Other (See Comments)    Childhood allergy; reaction unknown  . Tape Other (See Comments)    REDDNESS, BLISTER/  Use paper tape  . Norco [Hydrocodone-Acetaminophen] Rash      Review of Systems:  Constitutional:  No  fever, no chills, +recent illness, No unintentional weight changes. No significant fatigue.   HEENT: No  headache, no vision change  Cardiac: No  chest pain, No  pressure, No palpitations, No  Orthopnea  Respiratory:  No  shortness of breath. +Cough  Neurologic: No  weakness, No  dizziness,   Exam:  BP 122/76   Pulse 81   Temp (!) 97.3 F (36.3 C)   Resp 16   Ht 5\' 2"  (1.575 m)   Wt 129 lb (58.5 kg)   SpO2 97%  BMI 23.59 kg/m    Constitutional: VS see above. General Appearance: alert, well-developed, well-nourished, NAD  Eyes: Normal lids and conjunctive, non-icteric sclera  Ears, Nose, Mouth, Throat: MMM, Normal external inspection ears/nares/mouth/lips/gums.   Neck: No masses, trachea midline. No thyroid enlargement. No tenderness/mass appreciated. No lymphadenopathy  Respiratory: Normal respiratory effort. no wheeze, no rhonchi, no rales  Cardiovascular: S1/S2 normal, no murmur, no rub/gallop auscultated. RRR.   Neurological: Normal balance/coordination. No tremor.   Skin: warm, dry   Psychiatric: Normal judgment/insight. Normal mood and affect. Oriented x3.    No results found for this or any previous visit (from the past 72 hour(s)).  Dg Chest 2 View  Result Date: 02/04/2017 CLINICAL DATA:  Cough, history of pneumonia EXAM: CHEST  2 VIEW COMPARISON:  Chest x-ray of 07/26/2015 FINDINGS: On the  lateral view there is parenchymal opacity extending posteriorly into lower lobe suspicious for a focus of pneumonia. This area is not as well seen on the frontal view. No definite pleural effusion is seen. Mediastinal and hilar contours are unremarkable. The heart is within normal limits in size. No bony abnormality is seen. IMPRESSION: Focal opacity posteriorly on the lateral view in a lower lobe suspicious for pneumonia, not well seen on the frontal view appear Electronically Signed   By: Ivar Drape M.D.   On: 02/04/2017 16:25     ASSESSMENT/PLAN:   Given clinical improvement, I don't think chest x-ray warranted at this time for recheck.  Cough more likely due to clearing of infiltrate, possible viral irritation.  Patient unable to tolerate codeine. We will go ahead and send Tessalon Perles and inhaler. Consider steroids.   Pneumonia due to infectious organism, unspecified laterality, unspecified part of lung - Plan: benzonatate (TESSALON) 200 MG capsule, budesonide-formoterol (SYMBICORT) 80-4.5 MCG/ACT inhaler  Cough - Plan: benzonatate (TESSALON) 200 MG capsule, budesonide-formoterol (SYMBICORT) 80-4.5 MCG/ACT inhaler    Patient Instructions   I sent Rx for tessalon perles and inhaler to help cough. See below for other meds which might be helpful.     Over-the-Counter Medications & Home Remedies   Cough Prescription cough pills or syrups as directed Dextromethorphan (Robitussin, others) - cough suppressant Guaifenesin (Robitussin, Mucinex, others) - expectorant (helps cough up mucus) (Dextromethorphan and Guaifenesin also come in a combination tablet) Lozenges w/ Benzocaine + Menthol (Cepacol) Honey - as much as you want! Teas which "coat the throat" - look for ingredients Elm Bark, Licorice Root, Marshmallow Root       IF you received an x-ray today, you will receive an invoice from [facility]. Please contact [facility]iology at [phone] with questions or  concerns regarding your invoice.   IF you received labwork today, you will receive an invoice from [place]. Please contact [facility] at [phone] with questions or concerns regarding your invoice.   Our billing staff will not be able to assist you with questions regarding bills from these companies.  You will be contacted with the lab results as soon as they are available. The fastest way to get your results is to activate your My Chart account. Instructions are located on the last page of this paperwork. If you have not heard from [name] regarding the results in 2 weeks, please contact this office.         Visit summary with medication list and pertinent instructions was printed for patient to review. All questions at time of visit were answered - patient instructed to contact office with any additional concerns. ER/RTC precautions were reviewed with  the patient. Follow-up plan: Return for recheck as needed.  Note: Total time spent 15 minutes, greater than 50% of the visit was spent face-to-face counseling and coordinating care for the following: The primary encounter diagnosis was Pneumonia due to infectious organism, unspecified laterality, unspecified part of lung. A diagnosis of Cough was also pertinent to this visit.Marland Kitchen  Please note: voice recognition software was used to produce this document, and typos may escape review. Please contact me for any needed clarifications.

## 2017-02-08 NOTE — Patient Instructions (Addendum)
I sent Rx for tessalon perles and inhaler to help cough. See below for other meds which might be helpful.     Over-the-Counter Medications & Home Remedies   Cough Prescription cough pills or syrups as directed Dextromethorphan (Robitussin, others) - cough suppressant Guaifenesin (Robitussin, Mucinex, others) - expectorant (helps cough up mucus) (Dextromethorphan and Guaifenesin also come in a combination tablet) Lozenges w/ Benzocaine + Menthol (Cepacol) Honey - as much as you want! Teas which "coat the throat" - look for ingredients Elm Bark, Licorice Root, Marshmallow Root       IF you received an x-ray today, you will receive an invoice from [facility]. Please contact [facility]iology at [phone] with questions or concerns regarding your invoice.   IF you received labwork today, you will receive an invoice from [place]. Please contact [facility] at [phone] with questions or concerns regarding your invoice.   Our billing staff will not be able to assist you with questions regarding bills from these companies.  You will be contacted with the lab results as soon as they are available. The fastest way to get your results is to activate your My Chart account. Instructions are located on the last page of this paperwork. If you have not heard from [name] regarding the results in 2 weeks, please contact this office.

## 2017-02-10 ENCOUNTER — Ambulatory Visit: Payer: Medicare Other | Admitting: Psychology

## 2017-02-12 ENCOUNTER — Ambulatory Visit (INDEPENDENT_AMBULATORY_CARE_PROVIDER_SITE_OTHER): Payer: No Typology Code available for payment source | Admitting: Psychology

## 2017-02-12 DIAGNOSIS — F4320 Adjustment disorder, unspecified: Secondary | ICD-10-CM

## 2017-02-24 ENCOUNTER — Ambulatory Visit (INDEPENDENT_AMBULATORY_CARE_PROVIDER_SITE_OTHER): Payer: Medicare Other

## 2017-02-24 ENCOUNTER — Other Ambulatory Visit: Payer: Self-pay

## 2017-02-24 ENCOUNTER — Ambulatory Visit (INDEPENDENT_AMBULATORY_CARE_PROVIDER_SITE_OTHER): Payer: Medicare Other | Admitting: Physician Assistant

## 2017-02-24 ENCOUNTER — Encounter: Payer: Self-pay | Admitting: Physician Assistant

## 2017-02-24 VITALS — BP 126/78 | HR 78 | Resp 16 | Ht 62.0 in | Wt 126.8 lb

## 2017-02-24 DIAGNOSIS — J189 Pneumonia, unspecified organism: Secondary | ICD-10-CM | POA: Diagnosis not present

## 2017-02-24 NOTE — Progress Notes (Signed)
02/24/2017 9:31 AM   DOB: 05-04-1940 / MRN: [id number]  SUBJECTIVE:  Kristen Castillo is a 76 y.o. female presenting for recheck pneumonia. She presented for follow up with Dr. Sheppard Coil who saw some clinical improvement and given patient was not tolerated narcotic cough syrup started her on tessalon pearls and Symbicort. [name] has no history of asthma.  She is a never smoker.     She is allergic to codeine; dilaudid [hydromorphone hcl]; sulfonamide derivatives; tape; and norco [hydrocodone-acetaminophen].   She  has a past medical history of Anxiety, Cancer ([place]), Complication of anesthesia, Congestion of both ears, Deviated nasal septum, Diverticulitis, DVT (deep venous thrombosis) ([place]), GERD (gastroesophageal reflux disease), Hair loss, Hematuria, History of creation of ostomy ([place]), History of skin cancer, Kidney cysts, Migraines, Molar pregnancy, Molar pregnancy, Osteoarthritis, Osteoporosis, Osteoporosis, Persistent cough, Pneumonia, PONV (postoperative nausea and vomiting), Unspecified vitamin D deficiency, and Ventral hernia.    She  reports that  has never smoked. she has never used smokeless tobacco. She reports that she does not drink alcohol or use drugs. She  reports that she currently engages in sexual activity. The patient  has a past surgical history that includes Abdominal hysterectomy (1972); Oophorectomy (1976); Tonsillectomy; Lipoma resection; Reduction mammaplasty (1994); Rhinoplasty (1985); Facial cosmetic surgery (2009); Laparoscopic partial colectomy (N/A, 05/03/2014); laparotomy (N/A, 05/03/2014); Ileo loop colostomy closure (N/A, 11/08/2014); Laparoscopic lysis of adhesions (N/A, 11/08/2014); Ventral hernia repair (N/A, 08/01/2015); Insertion of mesh (N/A, 08/01/2015); and Laparoscopic lysis of adhesions (N/A, 08/01/2015).  Her family history includes Arthritis in her mother; Cancer in her maternal grandfather; Colon cancer (age of onset: 32) in her mother; Dementia in her  father; Diabetes in her sister; Heart disease in her father and maternal grandmother.  Review of Systems  Constitutional: Negative for chills, diaphoresis and fever.  Eyes: Negative.   Respiratory: Negative for cough, hemoptysis, sputum production, shortness of breath and wheezing.   Cardiovascular: Negative for chest pain, orthopnea and leg swelling.  Gastrointestinal: Negative for nausea.  Skin: Negative for rash.  Neurological: Negative for dizziness, sensory change, speech change, focal weakness and headaches.    The problem list and medications were reviewed and updated by myself where necessary and exist elsewhere in the encounter.   OBJECTIVE:  Pulse 78   Resp 16   Ht 5\' 2"  (1.575 m)   Wt 126 lb 12.8 oz (57.5 kg)   SpO2 96%   BMI 23.19 kg/m   Physical Exam  Constitutional: She is active.  Non-toxic appearance.  Cardiovascular: Normal rate, regular rhythm, S1 normal, S2 normal, normal heart sounds and intact distal pulses. Exam reveals no gallop, no friction rub and no decreased pulses.  No murmur heard. Pulmonary/Chest: Effort normal. No stridor. No tachypnea. No respiratory distress. She has no wheezes. She has no rales.  Abdominal: She exhibits no distension.  Musculoskeletal: She exhibits no edema.  Neurological: She is alert.  Skin: Skin is warm and dry. She is not diaphoretic. No pallor.    No results found for this or any previous visit (from the past 72 hour(s)).  Dg Chest 2 View  Result Date: 02/24/2017 CLINICAL DATA:  Community-acquired pneumonia. EXAM: CHEST  2 VIEW COMPARISON:  Chest x-ray of February 04, 2017 FINDINGS: The lungs are well-expanded. Infiltrate in the retrocardiac region previously demonstrated has improved. The density that persists is most compatible with pulmonary vascularity. Once again on the frontal radiograph definite abnormality is not observed. The heart and pulmonary vascularity are normal. There is  calcification in the wall of the  aortic arch. The trachea is midline. The bony thorax is unremarkable. IMPRESSION: Interval clearing of infiltrate in the retrocardiac region. No definite acute cardiopulmonary abnormality today. If the patient has any persistent symptoms, chest CT scanning now would be recommended. Electronically Signed   By: David  Martinique M.D.   On: 02/24/2017 09:17    ASSESSMENT AND PLAN:  [name] was seen today for follow-up.  Diagnoses and all orders for this visit:  Community acquired pneumonia, unspecified laterality: Rads clear.  Stopping symbicort as there is no indication for chornic therapy. Stopping tessalon. She continues to complain of mild persistent cough, however makes an associates with allergies.  She tells me she has acid reflux but this is no more than a couple times a year. She may have LPR but given history of osteoporosis would not advise zantac without further work up.  She will elevated the head of the bed and discuss the results with Dr. Alain Marion.  -     DG Chest 2 View; Future    The patient is advised to call or return to clinic if she does not see an improvement in symptoms, or to seek the care of the closest emergency department if she worsens with the above plan.   [name], MHS, PA-C [facility] 02/24/2017 9:31 AM

## 2017-02-24 NOTE — Patient Instructions (Addendum)
  Please discontinue Symbicort and Tessalon Pearls. Thank you for allowing me to care for you today.   Tereasa Coop, MS PA-C   IF you received an x-ray today, you will receive an invoice from [facility]. Please contact [facility] Radiology at [phone] with questions or concerns regarding your invoice.   IF you received labwork today, you will receive an invoice from [place]. Please contact [facility] at [phone] with questions or concerns regarding your invoice.   Our billing staff will not be able to assist you with questions regarding bills from these companies.  You will be contacted with the lab results as soon as they are available. The fastest way to get your results is to activate your My Chart account. Instructions are located on the last page of this paperwork. If you have not heard from [name] regarding the results in 2 weeks, please contact this office.

## 2017-03-11 DIAGNOSIS — J189 Pneumonia, unspecified organism: Secondary | ICD-10-CM

## 2017-03-11 HISTORY — DX: Pneumonia, unspecified organism: J18.9

## 2017-03-17 ENCOUNTER — Ambulatory Visit: Payer: Medicare Other | Admitting: Internal Medicine

## 2017-03-17 ENCOUNTER — Encounter: Payer: Self-pay | Admitting: Internal Medicine

## 2017-03-17 DIAGNOSIS — K219 Gastro-esophageal reflux disease without esophagitis: Secondary | ICD-10-CM

## 2017-03-17 DIAGNOSIS — I2692 Saddle embolus of pulmonary artery without acute cor pulmonale: Secondary | ICD-10-CM | POA: Diagnosis not present

## 2017-03-17 DIAGNOSIS — F419 Anxiety disorder, unspecified: Secondary | ICD-10-CM | POA: Diagnosis not present

## 2017-03-17 DIAGNOSIS — R05 Cough: Secondary | ICD-10-CM | POA: Diagnosis not present

## 2017-03-17 DIAGNOSIS — M810 Age-related osteoporosis without current pathological fracture: Secondary | ICD-10-CM

## 2017-03-17 DIAGNOSIS — R059 Cough, unspecified: Secondary | ICD-10-CM

## 2017-03-17 MED ORDER — ZOSTER VAC RECOMB ADJUVANTED 50 MCG/0.5ML IM SUSR: 0.5000 mL | Freq: Once | INTRAMUSCULAR | 1 refills | Status: DC

## 2017-03-17 MED ORDER — LORAZEPAM 0.5 MG PO TABS: 0.5000 mg | ORAL_TABLET | Freq: Two times a day (BID) | ORAL | 1 refills | Status: DC | PRN

## 2017-03-17 MED ORDER — LEVALBUTEROL TARTRATE 45 MCG/ACT IN AERO: 2.0000 | INHALATION_SPRAY | Freq: Four times a day (QID) | RESPIRATORY_TRACT | 5 refills | Status: DC

## 2017-03-17 MED ORDER — RANITIDINE HCL 150 MG PO TABS: 150.0000 mg | ORAL_TABLET | Freq: Every day | ORAL | 5 refills | Status: DC

## 2017-03-17 NOTE — Progress Notes (Signed)
Subjective:  Patient ID: Kristen Castillo, female    DOB: May 25, 1940  Age: 77 y.o. MRN: [id number]  CC: No chief complaint on file.   HPI Kristen Castillo presents for a recent pneumonia (11/18) C/o cough, GERD No CP Symbicort helped w/cough, was stopped due to cocerns of bone loss by U C/o family stress  Outpatient Medications Prior to Visit  Medication Sig Dispense Refill  . budesonide (RHINOCORT ALLERGY) 32 MCG/ACT nasal spray Place 1 spray into both nostrils daily.    . calcium carbonate (OS-CAL) 600 MG TABS tablet Take 600 mg by mouth 2 (two) times daily with a meal. Reported on 08/29/2015    . Cholecalciferol 1000 UNITS tablet Take 2,000 Units by mouth 2 (two) times daily. Reported on 08/29/2015    . denosumab (PROLIA) 60 MG/ML SOLN injection Inject 60 mg into the skin every 6 (six) months. Administer in upper arm, thigh, or abdomen    . ELIQUIS 2.5 MG TABS tablet TAKE 1 TABLET BY MOUTH TWICE DAILY. 60 tablet 2  . LORazepam (ATIVAN) 0.5 MG tablet Take 1 tablet (0.5 mg total) by mouth 2 (two) times daily as needed for anxiety. 30 tablet 1  . Multiple Vitamin (MULTIVITAMIN) tablet Take 1 tablet by mouth daily. Reported on 08/29/2015     No facility-administered medications prior to visit.     ROS Review of Systems  Constitutional: Negative for activity change, appetite change, chills, fatigue and unexpected weight change.  HENT: Negative for congestion, mouth sores and sinus pressure.   Eyes: Negative for visual disturbance.  Respiratory: Positive for cough. Negative for chest tightness.   Gastrointestinal: Negative for abdominal pain and nausea.  Genitourinary: Negative for difficulty urinating, frequency and vaginal pain.  Musculoskeletal: Negative for back pain and gait problem.  Skin: Negative for pallor and rash.  Neurological: Negative for dizziness, tremors, weakness, numbness and headaches.  Psychiatric/Behavioral: Negative for confusion and sleep disturbance.     Objective:  BP (!) 142/88 (BP Location: Left Arm, Patient Position: Sitting, Cuff Size: Normal)   Pulse 77   Temp 98.2 F (36.8 C) (Oral)   Ht 5\' 2"  (1.575 m)   Wt 125 lb (56.7 kg)   SpO2 99%   BMI 22.86 kg/m   BP Readings from Last 3 Encounters:  03/17/17 (!) 142/88  02/24/17 126/78  02/08/17 122/76    Wt Readings from Last 3 Encounters:  03/17/17 125 lb (56.7 kg)  02/24/17 126 lb 12.8 oz (57.5 kg)  02/08/17 129 lb (58.5 kg)    Physical Exam  Constitutional: She appears well-developed. No distress.  HENT:  Head: Normocephalic.  Right Ear: External ear normal.  Left Ear: External ear normal.  Nose: Nose normal.  Mouth/Throat: Oropharynx is clear and moist.  Eyes: Conjunctivae are normal. Pupils are equal, round, and reactive to light. Right eye exhibits no discharge. Left eye exhibits no discharge.  Neck: Normal range of motion. Neck supple. No JVD present. No tracheal deviation present. No thyromegaly present.  Cardiovascular: Normal rate, regular rhythm and normal heart sounds.  Pulmonary/Chest: No stridor. No respiratory distress. She has no wheezes.  Abdominal: Soft. Bowel sounds are normal. She exhibits no distension and no mass. There is no tenderness. There is no rebound and no guarding.  Musculoskeletal: She exhibits no edema or tenderness.  Lymphadenopathy:    She has no cervical adenopathy.  Neurological: She displays normal reflexes. No cranial nerve deficit. She exhibits normal muscle tone. Coordination normal.  Skin: No rash noted. No  erythema.  Psychiatric: Her behavior is normal. Judgment and thought content normal.  tearful  Lab Results  Component Value Date   WBC 8.4 02/04/2017   HGB 14.2 02/04/2017   HCT 43.1 02/04/2017   PLT 283.0 10/02/2016   GLUCOSE 104 (H) 10/02/2016   CHOL 210 (H) 02/07/2016   TRIG 119.0 02/07/2016   HDL 56.30 02/07/2016   LDLCALC 130 (H) 02/07/2016   ALT 9 06/04/2016   AST 15 06/04/2016   NA 139 10/02/2016   K  4.2 10/02/2016   CL 108 10/02/2016   CREATININE 0.98 10/02/2016   BUN 26 02/04/2017   CO2 26 10/02/2016   TSH 2.08 06/04/2016   INR 1.54 (H) 08/25/2015   HGBA1C 6.0 (H) 11/03/2014    Mm Screening Breast Tomo Bilateral  Result Date: 12/13/2016 CLINICAL DATA:  Screening. EXAM: 2D DIGITAL SCREENING BILATERAL MAMMOGRAM WITH CAD AND ADJUNCT TOMO COMPARISON:  Previous exam(s). ACR Breast Density Category c: The breast tissue is heterogeneously dense, which may obscure small masses. FINDINGS: There are no findings suspicious for malignancy. Images were processed with CAD. IMPRESSION: No mammographic evidence of malignancy. A result letter of this screening mammogram will be mailed directly to the patient. RECOMMENDATION: Screening mammogram in one year. (Code:SM-B-01Y) BI-RADS CATEGORY  1: Negative. Electronically Signed   By: Nolon Nations M.D.   On: 12/13/2016 12:44    Assessment & Plan:   There are no diagnoses linked to this encounter. I am having Kristen Castillo maintain her Cholecalciferol, calcium carbonate, multivitamin, budesonide, LORazepam, denosumab, and ELIQUIS.  No orders of the defined types were placed in this encounter.    Follow-up: No Follow-up on file.  Walker Kehr, MD

## 2017-03-17 NOTE — Assessment & Plan Note (Signed)
Zantac 

## 2017-03-17 NOTE — Assessment & Plan Note (Addendum)
Cough - post URI Xopenex MDI prn Zantac

## 2017-03-17 NOTE — Assessment & Plan Note (Signed)
Prolia

## 2017-03-17 NOTE — Assessment & Plan Note (Signed)
Alprazolam prn Dr Cheryln Manly

## 2017-03-17 NOTE — Assessment & Plan Note (Signed)
Eliquis 

## 2017-03-18 ENCOUNTER — Ambulatory Visit: Payer: Medicare Other | Admitting: Psychology

## 2017-04-03 ENCOUNTER — Ambulatory Visit: Payer: Self-pay | Admitting: Internal Medicine

## 2017-04-03 ENCOUNTER — Telehealth: Payer: Self-pay

## 2017-04-03 ENCOUNTER — Encounter: Payer: Self-pay | Admitting: Internal Medicine

## 2017-04-03 NOTE — Telephone Encounter (Signed)
Key: [id number]

## 2017-04-04 MED ORDER — ZOSTER VAC RECOMB ADJUVANTED 50 MCG/0.5ML IM SUSR: 0.5000 mL | Freq: Once | INTRAMUSCULAR | 1 refills | Status: AC

## 2017-04-10 NOTE — Telephone Encounter (Signed)
Pa denied

## 2017-04-11 ENCOUNTER — Ambulatory Visit (INDEPENDENT_AMBULATORY_CARE_PROVIDER_SITE_OTHER): Payer: Medicare Other

## 2017-04-11 DIAGNOSIS — M810 Age-related osteoporosis without current pathological fracture: Secondary | ICD-10-CM

## 2017-04-11 MED ORDER — DENOSUMAB 60 MG/ML ~~LOC~~ SOLN
60.0000 mg | Freq: Once | SUBCUTANEOUS | Status: AC
  Administered 2017-04-11: 60 mg via SUBCUTANEOUS

## 2017-04-11 NOTE — Progress Notes (Signed)
Medical screening examination/treatment/procedure(s) were performed by non-physician practitioner and as supervising physician I was immediately available for consultation/collaboration. I agree with above. Jazzman Loughmiller, MD  

## 2017-04-14 DIAGNOSIS — Z85828 Personal history of other malignant neoplasm of skin: Secondary | ICD-10-CM | POA: Diagnosis not present

## 2017-04-14 DIAGNOSIS — D171 Benign lipomatous neoplasm of skin and subcutaneous tissue of trunk: Secondary | ICD-10-CM | POA: Diagnosis not present

## 2017-04-14 DIAGNOSIS — L720 Epidermal cyst: Secondary | ICD-10-CM | POA: Diagnosis not present

## 2017-04-14 DIAGNOSIS — L723 Sebaceous cyst: Secondary | ICD-10-CM | POA: Diagnosis not present

## 2017-04-16 ENCOUNTER — Ambulatory Visit: Payer: Medicare Other | Admitting: Psychology

## 2017-04-29 DIAGNOSIS — R311 Benign essential microscopic hematuria: Secondary | ICD-10-CM | POA: Diagnosis not present

## 2017-05-12 ENCOUNTER — Ambulatory Visit: Payer: Medicare Other | Admitting: Psychology

## 2017-05-20 ENCOUNTER — Encounter: Payer: Self-pay | Admitting: Internal Medicine

## 2017-06-16 ENCOUNTER — Other Ambulatory Visit (INDEPENDENT_AMBULATORY_CARE_PROVIDER_SITE_OTHER): Payer: Medicare Other

## 2017-06-16 ENCOUNTER — Ambulatory Visit: Payer: Medicare Other | Admitting: Internal Medicine

## 2017-06-16 ENCOUNTER — Encounter: Payer: Self-pay | Admitting: Internal Medicine

## 2017-06-16 DIAGNOSIS — E440 Moderate protein-calorie malnutrition: Secondary | ICD-10-CM | POA: Diagnosis not present

## 2017-06-16 DIAGNOSIS — E785 Hyperlipidemia, unspecified: Secondary | ICD-10-CM

## 2017-06-16 DIAGNOSIS — I2692 Saddle embolus of pulmonary artery without acute cor pulmonale: Secondary | ICD-10-CM

## 2017-06-16 DIAGNOSIS — R030 Elevated blood-pressure reading, without diagnosis of hypertension: Secondary | ICD-10-CM | POA: Insufficient documentation

## 2017-06-16 DIAGNOSIS — D508 Other iron deficiency anemias: Secondary | ICD-10-CM

## 2017-06-16 DIAGNOSIS — F419 Anxiety disorder, unspecified: Secondary | ICD-10-CM

## 2017-06-16 LAB — BASIC METABOLIC PANEL
BUN: 33 mg/dL — ABNORMAL HIGH (ref 6–23)
CALCIUM: 9.3 mg/dL (ref 8.4–10.5)
CO2: 25 meq/L (ref 19–32)
CREATININE: 0.9 mg/dL (ref 0.40–1.20)
Chloride: 107 mEq/L (ref 96–112)
GFR: 64.61 mL/min (ref >60.00)
GLUCOSE: 108 mg/dL — AB (ref 70–99)
Potassium: 4.3 mEq/L (ref 3.5–5.1)
SODIUM: 140 meq/L (ref 135–145)

## 2017-06-16 LAB — IBC PANEL
Iron: 72 ug/dL (ref 42–145)
SATURATION RATIOS: 21.2 % (ref 20.0–50.0)
TRANSFERRIN: 243 mg/dL (ref 212.0–360.0)

## 2017-06-16 MED ORDER — ZOSTER VAC RECOMB ADJUVANTED 50 MCG/0.5ML IM SUSR: 0.5000 mL | Freq: Once | INTRAMUSCULAR | 1 refills | Status: DC

## 2017-06-16 MED ORDER — LORAZEPAM 0.5 MG PO TABS: 0.5000 mg | ORAL_TABLET | Freq: Two times a day (BID) | ORAL | 3 refills | Status: DC | PRN

## 2017-06-16 MED ORDER — ZOSTER VAC RECOMB ADJUVANTED 50 MCG/0.5ML IM SUSR
0.5000 mL | Freq: Once | INTRAMUSCULAR | 1 refills | Status: AC
Start: 2017-06-16 — End: 2017-06-16

## 2017-06-16 NOTE — Assessment & Plan Note (Signed)
Eliquis 

## 2017-06-16 NOTE — Assessment & Plan Note (Signed)
Lorazepam prn  Potential benefits of a long term benzodiazepines  use as well as potential risks  and complications were explained to the patient and were aknowledged.  

## 2017-06-16 NOTE — Patient Instructions (Signed)
Normal BP<130/85 

## 2017-06-16 NOTE — Assessment & Plan Note (Signed)
resolved 

## 2017-06-16 NOTE — Assessment & Plan Note (Signed)
Check BP at home

## 2017-06-16 NOTE — Progress Notes (Signed)
Subjective:  Patient ID: Kristen Castillo, female    DOB: 1940/04/27  Age: 77 y.o. MRN: [id number]  CC: No chief complaint on file.   HPI Kristen Castillo presents for anticoagulation, stress/anxiety, elevated BP f/u  Outpatient Medications Prior to Visit  Medication Sig Dispense Refill  . budesonide (RHINOCORT ALLERGY) 32 MCG/ACT nasal spray Place 1 spray into both nostrils daily.    . calcium carbonate (OS-CAL) 600 MG TABS tablet Take 600 mg by mouth 2 (two) times daily with a meal. Reported on 08/29/2015    . Cholecalciferol 1000 UNITS tablet Take 2,000 Units by mouth 2 (two) times daily. Reported on 08/29/2015    . denosumab (PROLIA) 60 MG/ML SOLN injection Inject 60 mg into the skin every 6 (six) months. Administer in upper arm, thigh, or abdomen    . ELIQUIS 2.5 MG TABS tablet TAKE 1 TABLET BY MOUTH TWICE DAILY. 60 tablet 2  . levalbuterol (XOPENEX HFA) 45 MCG/ACT inhaler Inhale 2 puffs into the lungs 4 (four) times daily. 1 Inhaler 5  . LORazepam (ATIVAN) 0.5 MG tablet Take 1 tablet (0.5 mg total) by mouth 2 (two) times daily as needed for anxiety. 30 tablet 1  . Multiple Vitamin (MULTIVITAMIN) tablet Take 1 tablet by mouth daily. Reported on 08/29/2015    . ranitidine (ZANTAC) 150 MG tablet Take 1 tablet (150 mg total) by mouth at bedtime. 30 tablet 5   No facility-administered medications prior to visit.     ROS Review of Systems  Constitutional: Negative for activity change, appetite change, chills, fatigue and unexpected weight change.  HENT: Negative for congestion, mouth sores and sinus pressure.   Eyes: Negative for visual disturbance.  Respiratory: Negative for cough and chest tightness.   Gastrointestinal: Negative for abdominal pain and nausea.  Genitourinary: Negative for difficulty urinating, frequency and vaginal pain.  Musculoskeletal: Negative for back pain and gait problem.  Skin: Negative for pallor and rash.  Neurological: Negative for dizziness, tremors,  weakness, numbness and headaches.  Psychiatric/Behavioral: Negative for confusion and sleep disturbance. The patient is nervous/anxious.     Objective:  BP (!) 152/82 (BP Location: Left Arm, Patient Position: Sitting, Cuff Size: Normal)   Pulse 71   Temp 98 F (36.7 C) (Oral)   Ht 5\' 2"  (1.575 m)   Wt 131 lb (59.4 kg)   SpO2 98%   BMI 23.96 kg/m   BP Readings from Last 3 Encounters:  06/16/17 (!) 152/82  03/17/17 (!) 142/88  02/24/17 126/78    Wt Readings from Last 3 Encounters:  06/16/17 131 lb (59.4 kg)  03/17/17 125 lb (56.7 kg)  02/24/17 126 lb 12.8 oz (57.5 kg)    Physical Exam  Constitutional: She appears well-developed. No distress.  HENT:  Head: Normocephalic.  Right Ear: External ear normal.  Left Ear: External ear normal.  Nose: Nose normal.  Mouth/Throat: Oropharynx is clear and moist.  Eyes: Pupils are equal, round, and reactive to light. Conjunctivae are normal. Right eye exhibits no discharge. Left eye exhibits no discharge.  Neck: Normal range of motion. Neck supple. No JVD present. No tracheal deviation present. No thyromegaly present.  Cardiovascular: Normal rate, regular rhythm and normal heart sounds.  Pulmonary/Chest: No stridor. No respiratory distress. She has no wheezes.  Abdominal: Soft. Bowel sounds are normal. She exhibits no distension and no mass. There is no tenderness. There is no rebound and no guarding.  Musculoskeletal: She exhibits no edema or tenderness.  Lymphadenopathy:    She has  no cervical adenopathy.  Neurological: She displays normal reflexes. No cranial nerve deficit. She exhibits normal muscle tone. Coordination normal.  Skin: No rash noted. No erythema.  Psychiatric: She has a normal mood and affect. Her behavior is normal. Judgment and thought content normal.  tearful    Lab Results  Component Value Date   WBC 8.4 02/04/2017   HGB 14.2 02/04/2017   HCT 43.1 02/04/2017   PLT 283.0 10/02/2016   GLUCOSE 104 (H)  10/02/2016   CHOL 210 (H) 02/07/2016   TRIG 119.0 02/07/2016   HDL 56.30 02/07/2016   LDLCALC 130 (H) 02/07/2016   ALT 9 06/04/2016   AST 15 06/04/2016   NA 139 10/02/2016   K 4.2 10/02/2016   CL 108 10/02/2016   CREATININE 0.98 10/02/2016   BUN 26 02/04/2017   CO2 26 10/02/2016   TSH 2.08 06/04/2016   INR 1.54 (H) 08/25/2015   HGBA1C 6.0 (H) 11/03/2014    Mm Screening Breast Tomo Bilateral  Result Date: 12/13/2016 CLINICAL DATA:  Screening. EXAM: 2D DIGITAL SCREENING BILATERAL MAMMOGRAM WITH CAD AND ADJUNCT TOMO COMPARISON:  Previous exam(s). ACR Breast Density Category c: The breast tissue is heterogeneously dense, which may obscure small masses. FINDINGS: There are no findings suspicious for malignancy. Images were processed with CAD. IMPRESSION: No mammographic evidence of malignancy. A result letter of this screening mammogram will be mailed directly to the patient. RECOMMENDATION: Screening mammogram in one year. (Code:SM-B-01Y) BI-RADS CATEGORY  1: Negative. Electronically Signed   By: Nolon Nations M.D.   On: 12/13/2016 12:44    Assessment & Plan:   There are no diagnoses linked to this encounter. I am having Kristen Castillo. Kristen Castillo maintain her Cholecalciferol, calcium carbonate, multivitamin, budesonide, denosumab, ELIQUIS, LORazepam, levalbuterol, ranitidine, and Zoster Vaccine Adjuvanted.  Meds ordered this encounter  Medications  . DISCONTD: Zoster Vaccine Adjuvanted [facility]) injection    Sig: Inject 0.5 mLs into the muscle once for 1 dose. Repeat in  6 months once.    Dispense:  0.5 mL    Refill:  1  . Zoster Vaccine Adjuvanted [facility]) injection    Sig: Inject 0.5 mLs into the muscle once for 1 dose. Repeat in  6 months once.    Dispense:  0.5 mL    Refill:  1  . DISCONTD: Zoster Vaccine Adjuvanted [facility]) injection    Sig: Inject 0.5 mLs into the muscle once for 1 dose. Repeat in  6 months once.    Dispense:  0.5 mL    Refill:  1     Follow-up: No  follow-ups on file.  Walker Kehr, MD

## 2017-06-18 ENCOUNTER — Ambulatory Visit: Payer: Self-pay

## 2017-06-19 ENCOUNTER — Encounter: Payer: Self-pay | Admitting: Internal Medicine

## 2017-06-23 ENCOUNTER — Encounter: Payer: Self-pay | Admitting: Internal Medicine

## 2017-07-21 ENCOUNTER — Ambulatory Visit: Payer: Self-pay

## 2017-07-22 ENCOUNTER — Ambulatory Visit: Payer: Medicare Other | Admitting: Internal Medicine

## 2017-07-22 ENCOUNTER — Telehealth: Payer: Self-pay | Admitting: Internal Medicine

## 2017-07-22 ENCOUNTER — Encounter: Payer: Self-pay | Admitting: Internal Medicine

## 2017-07-22 ENCOUNTER — Ambulatory Visit (INDEPENDENT_AMBULATORY_CARE_PROVIDER_SITE_OTHER)
Admission: RE | Admit: 2017-07-22 | Discharge: 2017-07-22 | Disposition: A | Discharge status: Home / Self Care | Payer: Medicare Other | Source: Ambulatory Visit | Attending: Internal Medicine | Admitting: Internal Medicine

## 2017-07-22 VITALS — BP 110/64 | HR 113 | Temp 98.6°F | Ht 62.0 in | Wt 126.0 lb

## 2017-07-22 DIAGNOSIS — R05 Cough: Secondary | ICD-10-CM

## 2017-07-22 DIAGNOSIS — R059 Cough, unspecified: Secondary | ICD-10-CM

## 2017-07-22 NOTE — Telephone Encounter (Signed)
Call and let her know that the chest x-ray looks clear. We are still awaiting the final read from radiology but that I have looked at it personally.

## 2017-07-22 NOTE — Telephone Encounter (Signed)
Patient informed of MD response and stated understanding  

## 2017-07-22 NOTE — Assessment & Plan Note (Signed)
No indication for antibiotics or steroids today. Checking CXR given history. Continue otc medications for symptomatic relief.

## 2017-07-22 NOTE — Progress Notes (Signed)
   Subjective:    Patient ID: Kristen Castillo, female    DOB: 05-15-1940, 77 y.o.   MRN: [id number]  HPI The patient is a 77 YO female coming in for cough, fevers and fatigue. Some SOB. Still taking her eliquis without missing doses. She is improving slightly today. Felt poorly Sunday when it started and on Monday as well. She has been taking her allergy medicines and has not been having any symptoms of that lately. She has had pneumonia several times in the past and is concerned about having that again as this feels similar. Last fever on Sunday.   Review of Systems  Constitutional: Positive for activity change, appetite change, chills, fatigue and fever. Negative for unexpected weight change.  HENT: Positive for congestion, postnasal drip, rhinorrhea and sinus pressure. Negative for ear discharge, ear pain, sinus pain, sneezing, sore throat, tinnitus, trouble swallowing and voice change.   Eyes: Negative.   Respiratory: Positive for cough and shortness of breath. Negative for chest tightness and wheezing.   Cardiovascular: Negative.   Gastrointestinal: Negative.   Neurological: Negative.       Objective:   Physical Exam  Constitutional: She is oriented to person, place, and time. She appears well-developed and well-nourished.  HENT:  Head: Normocephalic and atraumatic.  Oropharynx with redness and clear drainage, nose with swollen turbinates, TMs normal bilaterally  Eyes: EOM are normal.  Neck: Normal range of motion. No thyromegaly present.  Cardiovascular: Normal rate and regular rhythm.  Pulmonary/Chest: Effort normal and breath sounds normal. No respiratory distress. She has no wheezes. She has no rales.  Abdominal: Soft.  Lymphadenopathy:    She has no cervical adenopathy.  Neurological: She is alert and oriented to person, place, and time.  Skin: Skin is warm and dry.   Vitals:   07/22/17 1052  BP: 110/64  Pulse: (!) 113  Temp: 98.6 F (37 C)  TempSrc: Oral  SpO2: 94%    Weight: 126 lb (57.2 kg)  Height: 5\' 2"  (1.575 m)      Assessment & Plan:

## 2017-07-23 ENCOUNTER — Other Ambulatory Visit: Payer: Self-pay | Admitting: Internal Medicine

## 2017-07-23 MED ORDER — DOXYCYCLINE HYCLATE 100 MG PO TABS
100.0000 mg | ORAL_TABLET | Freq: Two times a day (BID) | ORAL | 0 refills | Status: DC
Start: 2017-07-23 — End: 2017-08-15

## 2017-08-12 ENCOUNTER — Other Ambulatory Visit: Payer: Self-pay | Admitting: Emergency Medicine

## 2017-08-15 ENCOUNTER — Other Ambulatory Visit: Payer: Self-pay

## 2017-08-15 ENCOUNTER — Encounter: Payer: Self-pay | Admitting: Physician Assistant

## 2017-08-15 ENCOUNTER — Ambulatory Visit: Payer: Medicare Other | Admitting: Physician Assistant

## 2017-08-15 ENCOUNTER — Ambulatory Visit (INDEPENDENT_AMBULATORY_CARE_PROVIDER_SITE_OTHER): Payer: Medicare Other

## 2017-08-15 VITALS — BP 108/68 | HR 63 | Temp 97.3°F | Resp 20 | Ht 61.5 in | Wt 126.6 lb

## 2017-08-15 DIAGNOSIS — J181 Lobar pneumonia, unspecified organism: Secondary | ICD-10-CM | POA: Diagnosis not present

## 2017-08-15 DIAGNOSIS — J189 Pneumonia, unspecified organism: Secondary | ICD-10-CM

## 2017-08-15 NOTE — Progress Notes (Signed)
08/15/2017 2:51 PM   DOB: 06/16/1940 / MRN: [id number]  SUBJECTIVE:  Kristen Castillo is a 77 y.o. female presenting for pneumonia follow-up.  Patient seen by Dr. Sharlet Salina outside of [facility] diagnosed with pneumonia about 3 weeks ago.  She is here for a follow-up chest x-ray.  She does have a history of GERD and denies dysphasia odynophagia, weight loss and feels her symptoms are under control at this time.  She does not sleep with the head of the bed elevated.  She does exercise and tries to get 5000 steps daily.   Immunization History  Administered Date(s) Administered  . H1N1 04/16/2008  . Influenza Whole 12/25/2009, 12/09/2011  . Influenza, High Dose Seasonal PF 12/26/2015, 01/01/2017  . Influenza,inj,Quad PF,6+ Mos 11/25/2014  . Influenza-Unspecified 03/01/2014  . Pneumococcal Conjugate-13 04/14/2013  . Pneumococcal Polysaccharide-23 03/11/2002, 12/25/2009  . Td 02/09/2009  . Zoster 02/17/2009     She is allergic to codeine; dilaudid [hydromorphone hcl]; sulfonamide derivatives; tape; and norco [hydrocodone-acetaminophen].   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    She  reports that she has never smoked. She has never used smokeless tobacco. She reports that she does not drink alcohol or use drugs. She  reports that she currently engages in sexual activity. The patient  has a past surgical history that includes Abdominal hysterectomy (1972); Oophorectomy (1976); Tonsillectomy; Lipoma resection;  Reduction mammaplasty (1994); Rhinoplasty (1985); Facial cosmetic surgery (2009); Laparoscopic partial colectomy (N/A, 05/03/2014); laparotomy (N/A, 05/03/2014); Ileo loop colostomy closure (N/A, 11/08/2014); Laparoscopic lysis of adhesions (N/A, 11/08/2014); Ventral hernia repair (N/A, 08/01/2015); Insertion of mesh (N/A, 08/01/2015); and Laparoscopic lysis of adhesions (N/A, 08/01/2015).  Her family history includes Arthritis in her mother; Cancer in her maternal grandfather; Colon cancer (age of onset: 33) in her mother; Dementia in her father; Diabetes in her sister; Heart disease in her father and maternal grandmother.  Review of Systems  Constitutional: Negative for chills, diaphoresis and fever.  Eyes: Negative.   Respiratory: Negative for cough, hemoptysis, sputum production, shortness of breath and wheezing.   Cardiovascular: Negative for chest pain, orthopnea and leg swelling.  Gastrointestinal: Negative for abdominal pain, blood in stool, constipation, diarrhea, heartburn, melena, nausea and vomiting.  Skin: Negative for rash.  Neurological: Negative for dizziness, sensory change, speech change, focal weakness and headaches.    The problem list and medications were reviewed and updated by myself where necessary and exist elsewhere in the encounter.   OBJECTIVE:  BP 108/68   Pulse 63   Temp (!) 97.3 F (36.3 C)   Resp 20   Ht 5' 1.5" (1.562 m)   Wt 126 lb 9.6 oz (57.4 kg)   SpO2 97%   BMI 23.53 kg/m   Physical Exam  Constitutional: She is oriented to person, place, and time. She appears well-nourished. No distress.  Eyes: Pupils are equal, round, and reactive to light. EOM are normal.  Cardiovascular: Normal rate, regular rhythm, S1 normal, S2 normal, normal heart sounds and intact distal pulses. Exam reveals no gallop, no friction rub and no decreased pulses.  No murmur heard. Pulmonary/Chest: Effort normal. No stridor. No respiratory  distress. She has no wheezes. She has no  rales.  Abdominal: She exhibits no distension.  Musculoskeletal: She exhibits no edema.  Neurological: She is alert and oriented to person, place, and time. No cranial nerve deficit. Gait normal.  Skin: Skin is dry. She is not diaphoretic.  Psychiatric: She has a normal mood and affect.  Vitals reviewed.   No results found for this or any previous visit (from the past 72 hour(s)).  Dg Chest 2 View  Result Date: 08/15/2017 CLINICAL DATA:  Left lung pneumonia. EXAM: CHEST - 2 VIEW COMPARISON:  Chest x-rays dated 07/22/2017 and 02/24/2017 and 02/04/2017 and CT scan of the chest dated 08/23/2015 FINDINGS: Heart size and vascularity are normal. The patient has a prominent linear configuration to the left pericardial fat pad which is unchanged since the prior CT scan. No infiltrates or effusions. Bones are normal. IMPRESSION: No acute abnormality. The linear density at the left lung base in the region of the lingula is felt represent an atypical pericardial fat pad as demonstrated on prior chest CT of 08/23/2015. Electronically Signed   By: Lorriane Shire M.D.   On: 08/15/2017 14:47    ASSESSMENT AND PLAN:  Kristen Castillo was seen today for cough.  Diagnoses and all orders for this visit:  Pneumonia of left lower lobe due to infectious organism [facility]): Given history of GERD think is wise that she elevate the head of the bed and I have also advised that she purchase an incentive spirometer to prevent any atelectasis that could facilitate pneumonia.  She is reasonably concerned about another pneumonia as it tends to happen when she gets a cold.  She may benefit from pulmonary referral just to ensure nothing is being missed. -     DG Chest 2 View; Future -     Pneumococcal polysaccharide vaccine 23-valent greater than or equal to 2yo subcutaneous/IM    The patient is advised to call or return to clinic if she does not see an improvement in symptoms, or to seek the care of the closest emergency department if  she worsens with the above plan.   [name], MHS, PA-C [facility] 08/15/2017 2:51 PM

## 2017-08-15 NOTE — Patient Instructions (Addendum)
  No further pneumonia demonstrated on today's xray.  Please see Dr. Alain Marion for follow up.   In the meantime purchase an incentive spirometer online and elevated the head of the bed with two bricks under the head of the bed.   Dg Chest 2 View  Result Date: 08/15/2017 CLINICAL DATA:  Left lung pneumonia. EXAM: CHEST - 2 VIEW COMPARISON:  Chest x-rays dated 07/22/2017 and 02/24/2017 and 02/04/2017 and CT scan of the chest dated 08/23/2015 FINDINGS: Heart size and vascularity are normal. The patient has a prominent linear configuration to the left pericardial fat pad which is unchanged since the prior CT scan. No infiltrates or effusions. Bones are normal. IMPRESSION: No acute abnormality. The linear density at the left lung base in the region of the lingula is felt represent an atypical pericardial fat pad as demonstrated on prior chest CT of 08/23/2015. Electronically Signed   By: Lorriane Shire M.D.   On: 08/15/2017 14:47    IF you received an x-ray today, you will receive an invoice from [facility]. Please contact [facility] at [phone] with questions or concerns regarding your invoice.   IF you received labwork today, you will receive an invoice from [facility]. Please contact [facility] at [phone] with questions or concerns regarding your invoice.   Our billing staff will not be able to assist you with questions regarding bills from these companies.  You will be contacted with the lab results as soon as they are available. The fastest way to get your results is to activate your My Chart account. Instructions are located on the last page of this paperwork. If you have not heard from [name] regarding the results in 2 weeks, please contact this office.

## 2017-08-26 ENCOUNTER — Ambulatory Visit (INDEPENDENT_AMBULATORY_CARE_PROVIDER_SITE_OTHER): Payer: No Typology Code available for payment source | Admitting: Psychology

## 2017-08-26 DIAGNOSIS — H2000 Unspecified acute and subacute iridocyclitis: Secondary | ICD-10-CM | POA: Diagnosis not present

## 2017-08-26 DIAGNOSIS — F4320 Adjustment disorder, unspecified: Secondary | ICD-10-CM

## 2017-08-27 ENCOUNTER — Ambulatory Visit: Payer: Medicare Other | Admitting: Internal Medicine

## 2017-08-29 DIAGNOSIS — H2000 Unspecified acute and subacute iridocyclitis: Secondary | ICD-10-CM | POA: Diagnosis not present

## 2017-09-09 DIAGNOSIS — H40031 Anatomical narrow angle, right eye: Secondary | ICD-10-CM | POA: Diagnosis not present

## 2017-09-09 DIAGNOSIS — H40832 Aqueous misdirection, left eye: Secondary | ICD-10-CM | POA: Diagnosis not present

## 2017-09-09 DIAGNOSIS — Z961 Presence of intraocular lens: Secondary | ICD-10-CM | POA: Diagnosis not present

## 2017-09-16 ENCOUNTER — Other Ambulatory Visit (INDEPENDENT_AMBULATORY_CARE_PROVIDER_SITE_OTHER): Payer: Medicare Other

## 2017-09-16 ENCOUNTER — Encounter: Payer: Self-pay | Admitting: Internal Medicine

## 2017-09-16 ENCOUNTER — Ambulatory Visit: Payer: Medicare Other | Admitting: Internal Medicine

## 2017-09-16 VITALS — BP 110/74 | HR 70 | Temp 98.0°F | Ht 61.5 in | Wt 120.0 lb

## 2017-09-16 DIAGNOSIS — D509 Iron deficiency anemia, unspecified: Secondary | ICD-10-CM

## 2017-09-16 DIAGNOSIS — F419 Anxiety disorder, unspecified: Secondary | ICD-10-CM | POA: Diagnosis not present

## 2017-09-16 DIAGNOSIS — M810 Age-related osteoporosis without current pathological fracture: Secondary | ICD-10-CM

## 2017-09-16 DIAGNOSIS — R05 Cough: Secondary | ICD-10-CM

## 2017-09-16 DIAGNOSIS — E559 Vitamin D deficiency, unspecified: Secondary | ICD-10-CM

## 2017-09-16 DIAGNOSIS — I2692 Saddle embolus of pulmonary artery without acute cor pulmonale: Secondary | ICD-10-CM | POA: Diagnosis not present

## 2017-09-16 DIAGNOSIS — J189 Pneumonia, unspecified organism: Secondary | ICD-10-CM | POA: Diagnosis not present

## 2017-09-16 DIAGNOSIS — F439 Reaction to severe stress, unspecified: Secondary | ICD-10-CM | POA: Diagnosis not present

## 2017-09-16 DIAGNOSIS — R059 Cough, unspecified: Secondary | ICD-10-CM

## 2017-09-16 DIAGNOSIS — Z23 Encounter for immunization: Secondary | ICD-10-CM

## 2017-09-16 LAB — CBC WITH DIFFERENTIAL/PLATELET
BASOS ABS: 0.1 10*3/uL (ref 0.0–0.1)
Basophils Relative: 1.2 % (ref 0.0–3.0)
EOS ABS: 0.1 10*3/uL (ref 0.0–0.7)
Eosinophils Relative: 1.7 % (ref 0.0–5.0)
HEMATOCRIT: 43.5 % (ref 36.0–46.0)
Hemoglobin: 14.4 g/dL (ref 12.0–15.0)
Lymphocytes Relative: 18.7 % (ref 12.0–46.0)
Lymphs Abs: 1.3 10*3/uL (ref 0.7–4.0)
MCHC: 33.2 g/dL (ref 30.0–36.0)
MCV: 88.7 fl (ref 78.0–100.0)
MONOS PCT: 7 % (ref 3.0–12.0)
Monocytes Absolute: 0.5 10*3/uL (ref 0.1–1.0)
NEUTROS ABS: 5.1 10*3/uL (ref 1.4–7.7)
NEUTROS PCT: 71.4 % (ref 43.0–77.0)
PLATELETS: 274 10*3/uL (ref 150.0–400.0)
RBC: 4.9 Mil/uL (ref 3.87–5.11)
RDW: 14.7 % (ref 11.5–15.5)
WBC: 7.1 10*3/uL (ref 4.0–10.5)

## 2017-09-16 LAB — TSH: TSH: 3.01 u[IU]/mL (ref 0.35–4.50)

## 2017-09-16 LAB — BASIC METABOLIC PANEL
BUN: 30 mg/dL — ABNORMAL HIGH (ref 6–23)
CHLORIDE: 107 meq/L (ref 96–112)
CO2: 26 meq/L (ref 19–32)
Calcium: 9.3 mg/dL (ref 8.4–10.5)
Creatinine, Ser: 0.87 mg/dL (ref 0.40–1.20)
GFR: 67.14 mL/min (ref >60.00)
GLUCOSE: 101 mg/dL — AB (ref 70–99)
POTASSIUM: 4.3 meq/L (ref 3.5–5.1)
SODIUM: 138 meq/L (ref 135–145)

## 2017-09-16 MED ORDER — ZOSTER VAC RECOMB ADJUVANTED 50 MCG/0.5ML IM SUSR: 0.5000 mL | Freq: Once | INTRAMUSCULAR | 1 refills | Status: DC

## 2017-09-16 MED ORDER — ZOSTER VAC RECOMB ADJUVANTED 50 MCG/0.5ML IM SUSR: 0.5000 mL | Freq: Once | INTRAMUSCULAR | 1 refills | Status: AC

## 2017-09-16 NOTE — Addendum Note (Signed)
Addended by: Karren Cobble on: 09/16/2017 09:38 AM   Modules accepted: Orders

## 2017-09-16 NOTE — Assessment & Plan Note (Signed)
Pneumovax GERD wedge Flutter valve

## 2017-09-16 NOTE — Assessment & Plan Note (Signed)
Resolved

## 2017-09-16 NOTE — Assessment & Plan Note (Signed)
Lorazepam prn 

## 2017-09-16 NOTE — Assessment & Plan Note (Signed)
On Eliquis

## 2017-09-16 NOTE — Assessment & Plan Note (Signed)
CBC

## 2017-09-16 NOTE — Progress Notes (Signed)
Subjective:  Patient ID: Kristen Castillo, female    DOB: Oct 24, 1940  Age: 77 y.o. MRN: [id number]  CC: No chief complaint on file.   HPI Kristen Castillo presents for recurrent CAP, anxiety, DVT/PE f/u  Outpatient Medications Prior to Visit  Medication Sig Dispense Refill  . budesonide (RHINOCORT ALLERGY) 32 MCG/ACT nasal spray Place 1 spray into both nostrils daily.    . calcium carbonate (OS-CAL) 600 MG TABS tablet Take 600 mg by mouth 2 (two) times daily with a meal. Reported on 08/29/2015    . Cholecalciferol 1000 UNITS tablet Take 2,000 Units by mouth 2 (two) times daily. Reported on 08/29/2015    . denosumab (PROLIA) 60 MG/ML SOLN injection Inject 60 mg into the skin every 6 (six) months. Administer in upper arm, thigh, or abdomen    . ELIQUIS 2.5 MG TABS tablet TAKE 1 TABLET BY MOUTH TWICE DAILY. 60 tablet 1  . LORazepam (ATIVAN) 0.5 MG tablet Take 1 tablet (0.5 mg total) by mouth 2 (two) times daily as needed for anxiety. 30 tablet 3  . Multiple Vitamin (MULTIVITAMIN) tablet Take 1 tablet by mouth daily. Reported on 08/29/2015    . ranitidine (ZANTAC) 150 MG tablet Take 1 tablet (150 mg total) by mouth at bedtime. 30 tablet 5   No facility-administered medications prior to visit.     ROS: Review of Systems  Constitutional: Negative for activity change, appetite change, chills, fatigue and unexpected weight change.  HENT: Negative for congestion, mouth sores and sinus pressure.   Eyes: Negative for visual disturbance.  Respiratory: Negative for cough and chest tightness.   Gastrointestinal: Negative for abdominal pain and nausea.  Genitourinary: Negative for difficulty urinating, frequency and vaginal pain.  Musculoskeletal: Negative for back pain and gait problem.  Skin: Negative for pallor and rash.  Neurological: Negative for dizziness, tremors, weakness, numbness and headaches.  Psychiatric/Behavioral: Negative for confusion and sleep disturbance.    Objective:  BP  110/74 (BP Location: Left Arm, Patient Position: Sitting, Cuff Size: Normal)   Pulse 70   Temp 98 F (36.7 C) (Oral)   Ht 5' 1.5" (1.562 m)   Wt 120 lb (54.4 kg)   SpO2 95%   BMI 22.31 kg/m   BP Readings from Last 3 Encounters:  09/16/17 110/74  08/15/17 108/68  07/22/17 110/64    Wt Readings from Last 3 Encounters:  09/16/17 120 lb (54.4 kg)  08/15/17 126 lb 9.6 oz (57.4 kg)  07/22/17 126 lb (57.2 kg)    Physical Exam  Constitutional: She appears well-developed. No distress.  HENT:  Head: Normocephalic.  Right Ear: External ear normal.  Left Ear: External ear normal.  Nose: Nose normal.  Mouth/Throat: Oropharynx is clear and moist.  Eyes: Pupils are equal, round, and reactive to light. Conjunctivae are normal. Right eye exhibits no discharge. Left eye exhibits no discharge.  Neck: Normal range of motion. Neck supple. No JVD present. No tracheal deviation present. No thyromegaly present.  Cardiovascular: Normal rate, regular rhythm and normal heart sounds.  Pulmonary/Chest: No stridor. No respiratory distress. She has no wheezes.  Abdominal: Soft. Bowel sounds are normal. She exhibits no distension and no mass. There is no tenderness. There is no rebound and no guarding.  Musculoskeletal: She exhibits no edema or tenderness.  Lymphadenopathy:    She has no cervical adenopathy.  Neurological: She displays normal reflexes. No cranial nerve deficit. She exhibits normal muscle tone. Coordination normal.  Skin: No rash noted. No erythema.  Psychiatric:  She has a normal mood and affect. Her behavior is normal. Judgment and thought content normal.    Lab Results  Component Value Date   WBC 8.4 02/04/2017   HGB 14.2 02/04/2017   HCT 43.1 02/04/2017   PLT 283.0 10/02/2016   GLUCOSE 108 (H) 06/16/2017   CHOL 210 (H) 02/07/2016   TRIG 119.0 02/07/2016   HDL 56.30 02/07/2016   LDLCALC 130 (H) 02/07/2016   ALT 9 06/04/2016   AST 15 06/04/2016   NA 140 06/16/2017   K 4.3  06/16/2017   CL 107 06/16/2017   CREATININE 0.90 06/16/2017   BUN 33 (H) 06/16/2017   CO2 25 06/16/2017   TSH 2.08 06/04/2016   INR 1.54 (H) 08/25/2015   HGBA1C 6.0 (H) 11/03/2014    Dg Chest 2 View  Result Date: 07/22/2017 CLINICAL DATA:  Cough.  Pneumonia. EXAM: CHEST - 2 VIEW COMPARISON:  None. FINDINGS: The heart size is normal. Lingular airspace disease is present. The visualized soft tissues and bony thorax are otherwise unremarkable. IMPRESSION: 1. Lingular pneumonia. Electronically Signed   By: San Morelle M.D.   On: 07/22/2017 16:33    Assessment & Plan:   There are no diagnoses linked to this encounter.   Meds ordered this encounter  Medications  . DISCONTD: Zoster Vaccine Adjuvanted [facility]) injection    Sig: Inject 0.5 mLs into the muscle once for 1 dose. Repeat in  6 months once.    Dispense:  0.5 mL    Refill:  1  . DISCONTD: Zoster Vaccine Adjuvanted [facility]) injection    Sig: Inject 0.5 mLs into the muscle once for 1 dose. Repeat in  6 months once.    Dispense:  0.5 mL    Refill:  1  . Zoster Vaccine Adjuvanted [facility]) injection    Sig: Inject 0.5 mLs into the muscle once for 1 dose. Repeat in  6 months once.    Dispense:  0.5 mL    Refill:  1     Follow-up: No follow-ups on file.  Walker Kehr, MD

## 2017-09-16 NOTE — Assessment & Plan Note (Signed)
Vit D 

## 2017-09-16 NOTE — Assessment & Plan Note (Signed)
On Prolia 

## 2017-10-04 ENCOUNTER — Other Ambulatory Visit: Payer: Self-pay | Admitting: Emergency Medicine

## 2017-10-10 ENCOUNTER — Ambulatory Visit: Payer: Medicare Other

## 2017-10-10 DIAGNOSIS — M810 Age-related osteoporosis without current pathological fracture: Secondary | ICD-10-CM

## 2017-10-10 MED ORDER — DENOSUMAB 60 MG/ML ~~LOC~~ SOSY
60.0000 mg | PREFILLED_SYRINGE | Freq: Once | SUBCUTANEOUS | Status: AC
  Administered 2017-10-10: 60 mg via SUBCUTANEOUS

## 2017-10-27 DIAGNOSIS — B078 Other viral warts: Secondary | ICD-10-CM | POA: Diagnosis not present

## 2017-10-27 DIAGNOSIS — D485 Neoplasm of uncertain behavior of skin: Secondary | ICD-10-CM | POA: Diagnosis not present

## 2017-10-27 DIAGNOSIS — Z85828 Personal history of other malignant neoplasm of skin: Secondary | ICD-10-CM | POA: Diagnosis not present

## 2017-10-27 DIAGNOSIS — L570 Actinic keratosis: Secondary | ICD-10-CM | POA: Diagnosis not present

## 2017-10-28 ENCOUNTER — Ambulatory Visit (INDEPENDENT_AMBULATORY_CARE_PROVIDER_SITE_OTHER): Payer: No Typology Code available for payment source | Admitting: Psychology

## 2017-10-28 DIAGNOSIS — F4320 Adjustment disorder, unspecified: Secondary | ICD-10-CM | POA: Diagnosis not present

## 2017-10-29 ENCOUNTER — Encounter: Payer: Self-pay | Admitting: Gynecology

## 2017-11-18 DIAGNOSIS — B353 Tinea pedis: Secondary | ICD-10-CM | POA: Diagnosis not present

## 2017-11-18 DIAGNOSIS — Z85828 Personal history of other malignant neoplasm of skin: Secondary | ICD-10-CM | POA: Diagnosis not present

## 2017-11-18 DIAGNOSIS — L739 Follicular disorder, unspecified: Secondary | ICD-10-CM | POA: Diagnosis not present

## 2017-11-18 DIAGNOSIS — D485 Neoplasm of uncertain behavior of skin: Secondary | ICD-10-CM | POA: Diagnosis not present

## 2017-11-18 DIAGNOSIS — L720 Epidermal cyst: Secondary | ICD-10-CM | POA: Diagnosis not present

## 2017-11-27 DIAGNOSIS — Z85828 Personal history of other malignant neoplasm of skin: Secondary | ICD-10-CM | POA: Diagnosis not present

## 2017-11-27 DIAGNOSIS — L739 Follicular disorder, unspecified: Secondary | ICD-10-CM | POA: Diagnosis not present

## 2017-11-27 DIAGNOSIS — L570 Actinic keratosis: Secondary | ICD-10-CM | POA: Diagnosis not present

## 2017-12-04 ENCOUNTER — Ambulatory Visit: Payer: Medicare Other | Admitting: Gynecology

## 2017-12-04 ENCOUNTER — Encounter: Payer: Self-pay | Admitting: Gynecology

## 2017-12-04 VITALS — BP 118/74 | Ht 62.0 in | Wt 116.0 lb

## 2017-12-04 DIAGNOSIS — Z8759 Personal history of other complications of pregnancy, childbirth and the puerperium: Secondary | ICD-10-CM | POA: Diagnosis not present

## 2017-12-04 DIAGNOSIS — Z9189 Other specified personal risk factors, not elsewhere classified: Secondary | ICD-10-CM | POA: Diagnosis not present

## 2017-12-04 DIAGNOSIS — N952 Postmenopausal atrophic vaginitis: Secondary | ICD-10-CM

## 2017-12-04 DIAGNOSIS — Z01419 Encounter for gynecological examination (general) (routine) without abnormal findings: Secondary | ICD-10-CM | POA: Diagnosis not present

## 2017-12-04 DIAGNOSIS — Z779 Other contact with and (suspected) exposures hazardous to health: Secondary | ICD-10-CM | POA: Diagnosis not present

## 2017-12-04 DIAGNOSIS — Z23 Encounter for immunization: Secondary | ICD-10-CM

## 2017-12-04 DIAGNOSIS — Z1272 Encounter for screening for malignant neoplasm of vagina: Secondary | ICD-10-CM | POA: Diagnosis not present

## 2017-12-04 DIAGNOSIS — M810 Age-related osteoporosis without current pathological fracture: Secondary | ICD-10-CM

## 2017-12-04 NOTE — Progress Notes (Signed)
    SHARDE GOVER [birth year] [id number]        77 y.o.  G1P0010 for breast and pelvic exam.  Without gynecologic complaints  Past medical history,surgical history, problem list, medications, allergies, family history and social history were all reviewed and documented as reviewed in the EPIC chart.  ROS:  Performed with pertinent positives and negatives included in the history, assessment and plan.   Additional significant findings : None   Exam: [name] assistant Vitals:   12/04/17 0857  BP: 118/74  Weight: 116 lb (52.6 kg)  Height: 5\' 2"  (1.575 m)   Body mass index is 21.22 kg/m.  General appearance:  Normal affect, orientation and appearance. Skin: Grossly normal HEENT: Without gross lesions.  No cervical or supraclavicular adenopathy. Thyroid normal.  Lungs:  Clear without wheezing, rales or rhonchi Cardiac: RR, without RMG Abdominal:  Soft, nontender, without masses, guarding, rebound, organomegaly or hernia Breasts:  Examined lying and sitting without masses, retractions, discharge or axillary adenopathy. Pelvic:  Ext, BUS, Vagina: With atrophic changes  Adnexa: Without masses or tenderness    Anus and perineum: Normal   Rectovaginal: Normal sphincter tone without palpated masses or tenderness.    Assessment/Plan:  77 y.o. G1P0010 female for breast and pelvic exam.   1. Postmenopausal/atrophic genital changes.  Status post hysterectomy for molar pregnancy 1972.  Subsequent BSO for benign ovarian cysts.  Continues with issues with dyspareunia but uses OTC lubricants which is acceptable to her. 2. History of molar pregnancy.  Negative hCG is following.  Patient has annual hCGs which makes her feel better.  hCG quantitative ordered today. 3. Osteoporosis.  Actively followed by Dr. Alain Marion.  Had been on Forteo now on Prolia. 4. Pap smear 2016.  Pap smear of vaginal cuff done today.  No history of significant abnormal Pap smears.  We discussed options for stop  screening per current screening guidelines.  Will readdress on an annual basis. 5. Mammography coming due in October.  Breast exam normal today. 6. Colonoscopy 2016.  Repeat at their recommended interval. 7. Health maintenance.  No routine lab work done as patient does this elsewhere.  Follow-up 1 year, sooner as needed.   Anastasio Auerbach MD, 9:19 AM 12/04/2017

## 2017-12-04 NOTE — Addendum Note (Signed)
Addended by: Nelva Nay on: 12/04/2017 09:44 AM   Modules accepted: Orders

## 2017-12-04 NOTE — Patient Instructions (Signed)
Follow-up in 1 year for annual exam, sooner if any issues. 

## 2017-12-05 ENCOUNTER — Encounter: Payer: Self-pay | Admitting: Gynecology

## 2017-12-05 DIAGNOSIS — B353 Tinea pedis: Secondary | ICD-10-CM | POA: Diagnosis not present

## 2017-12-05 DIAGNOSIS — Z85828 Personal history of other malignant neoplasm of skin: Secondary | ICD-10-CM | POA: Diagnosis not present

## 2017-12-05 DIAGNOSIS — L738 Other specified follicular disorders: Secondary | ICD-10-CM | POA: Diagnosis not present

## 2017-12-05 LAB — HCG, QUANTITATIVE, PREGNANCY: HCG, Total, QN: 4 m[IU]/mL

## 2017-12-08 LAB — PAP IG W/ RFLX HPV ASCU

## 2017-12-09 ENCOUNTER — Other Ambulatory Visit: Payer: Self-pay | Admitting: Internal Medicine

## 2017-12-09 NOTE — Telephone Encounter (Signed)
Check [place] registry last filled 10/04/2017.Marland KitchenJohny Chess

## 2017-12-10 DIAGNOSIS — Z23 Encounter for immunization: Secondary | ICD-10-CM | POA: Diagnosis not present

## 2017-12-10 DIAGNOSIS — C44311 Basal cell carcinoma of skin of nose: Secondary | ICD-10-CM | POA: Diagnosis not present

## 2017-12-10 DIAGNOSIS — Z85828 Personal history of other malignant neoplasm of skin: Secondary | ICD-10-CM | POA: Diagnosis not present

## 2017-12-10 NOTE — Telephone Encounter (Signed)
MD approved and sent electronically to [facility]../[facility]  

## 2017-12-16 ENCOUNTER — Other Ambulatory Visit: Payer: Self-pay | Admitting: Gynecology

## 2017-12-16 DIAGNOSIS — Z1231 Encounter for screening mammogram for malignant neoplasm of breast: Secondary | ICD-10-CM

## 2018-01-07 ENCOUNTER — Other Ambulatory Visit: Payer: Self-pay | Admitting: Emergency Medicine

## 2018-01-15 DIAGNOSIS — H0012 Chalazion right lower eyelid: Secondary | ICD-10-CM | POA: Diagnosis not present

## 2018-01-19 ENCOUNTER — Ambulatory Visit
Admission: RE | Admit: 2018-01-19 | Discharge: 2018-01-19 | Disposition: A | Discharge status: Home / Self Care | Payer: Medicare Other | Source: Ambulatory Visit | Attending: Gynecology | Admitting: Gynecology

## 2018-01-19 DIAGNOSIS — Z1231 Encounter for screening mammogram for malignant neoplasm of breast: Secondary | ICD-10-CM | POA: Diagnosis not present

## 2018-01-20 ENCOUNTER — Encounter: Payer: Self-pay | Admitting: Internal Medicine

## 2018-01-20 ENCOUNTER — Ambulatory Visit: Payer: Medicare Other | Admitting: Internal Medicine

## 2018-01-20 ENCOUNTER — Other Ambulatory Visit (INDEPENDENT_AMBULATORY_CARE_PROVIDER_SITE_OTHER): Payer: Medicare Other

## 2018-01-20 DIAGNOSIS — E559 Vitamin D deficiency, unspecified: Secondary | ICD-10-CM

## 2018-01-20 DIAGNOSIS — F419 Anxiety disorder, unspecified: Secondary | ICD-10-CM | POA: Diagnosis not present

## 2018-01-20 DIAGNOSIS — K219 Gastro-esophageal reflux disease without esophagitis: Secondary | ICD-10-CM | POA: Diagnosis not present

## 2018-01-20 DIAGNOSIS — I2692 Saddle embolus of pulmonary artery without acute cor pulmonale: Secondary | ICD-10-CM | POA: Diagnosis not present

## 2018-01-20 DIAGNOSIS — M810 Age-related osteoporosis without current pathological fracture: Secondary | ICD-10-CM

## 2018-01-20 LAB — URINALYSIS, ROUTINE W REFLEX MICROSCOPIC
Bilirubin Urine: NEGATIVE
Ketones, ur: NEGATIVE
Leukocytes, UA: NEGATIVE
Nitrite: NEGATIVE
PH: 5.5 (ref 5.0–8.0)
SPECIFIC GRAVITY, URINE: 1.02 (ref 1.000–1.030)
TOTAL PROTEIN, URINE-UPE24: NEGATIVE
Urine Glucose: NEGATIVE
Urobilinogen, UA: 0.2 (ref 0.0–1.0)

## 2018-01-20 LAB — CBC WITH DIFFERENTIAL/PLATELET
BASOS PCT: 1.3 % (ref 0.0–3.0)
Basophils Absolute: 0.1 10*3/uL (ref 0.0–0.1)
EOS PCT: 2.4 % (ref 0.0–5.0)
Eosinophils Absolute: 0.2 10*3/uL (ref 0.0–0.7)
HEMATOCRIT: 42.5 % (ref 36.0–46.0)
Hemoglobin: 14.1 g/dL (ref 12.0–15.0)
LYMPHS PCT: 23.3 % (ref 12.0–46.0)
Lymphs Abs: 1.6 10*3/uL (ref 0.7–4.0)
MCHC: 33.2 g/dL (ref 30.0–36.0)
MCV: 90.4 fl (ref 78.0–100.0)
MONOS PCT: 8.3 % (ref 3.0–12.0)
Monocytes Absolute: 0.6 10*3/uL (ref 0.1–1.0)
NEUTROS PCT: 64.7 % (ref 43.0–77.0)
Neutro Abs: 4.4 10*3/uL (ref 1.4–7.7)
PLATELETS: 264 10*3/uL (ref 150.0–400.0)
RBC: 4.7 Mil/uL (ref 3.87–5.11)
RDW: 13.7 % (ref 11.5–15.5)
WBC: 6.7 10*3/uL (ref 4.0–10.5)

## 2018-01-20 LAB — BASIC METABOLIC PANEL
BUN: 42 mg/dL — ABNORMAL HIGH (ref 6–23)
CALCIUM: 9.3 mg/dL (ref 8.4–10.5)
CO2: 24 mEq/L (ref 19–32)
CREATININE: 0.95 mg/dL (ref 0.40–1.20)
Chloride: 108 mEq/L (ref 96–112)
GFR: 60.61 mL/min (ref >60.00)
Glucose, Bld: 102 mg/dL — ABNORMAL HIGH (ref 70–99)
Potassium: 4.5 mEq/L (ref 3.5–5.1)
Sodium: 139 mEq/L (ref 135–145)

## 2018-01-20 MED ORDER — ZOSTER VAC RECOMB ADJUVANTED 50 MCG/0.5ML IM SUSR: 0.5000 mL | Freq: Once | INTRAMUSCULAR | 1 refills | Status: AC

## 2018-01-20 MED ORDER — VITAMIN D3 1.25 MG (50000 UT) PO CAPS: 1.0000 | ORAL_CAPSULE | ORAL | 3 refills | Status: DC

## 2018-01-20 MED ORDER — FAMOTIDINE 20 MG PO TABS: 20.0000 mg | ORAL_TABLET | Freq: Two times a day (BID) | ORAL | 5 refills | Status: DC

## 2018-01-20 NOTE — Assessment & Plan Note (Signed)
Prolia

## 2018-01-20 NOTE — Assessment & Plan Note (Signed)
Zantac - d/c. Start Pepcid

## 2018-01-20 NOTE — Assessment & Plan Note (Signed)
Vit D 

## 2018-01-20 NOTE — Progress Notes (Signed)
Subjective:  Patient ID: Kristen Castillo, female    DOB: 04-09-1940  Age: 77 y.o. MRN: [id number]  CC: No chief complaint on file.   HPI Kristen Castillo presents for h/o PE, anxiety, GERD f/u Pt had L LBP x 1 d 10 d ago  Outpatient Medications Prior to Visit  Medication Sig Dispense Refill  . budesonide (RHINOCORT ALLERGY) 32 MCG/ACT nasal spray Place 1 spray into both nostrils daily.    . calcium carbonate (OS-CAL) 600 MG TABS tablet Take 600 mg by mouth 2 (two) times daily with a meal. Reported on 08/29/2015    . Cholecalciferol 1000 UNITS tablet Take 2,000 Units by mouth 2 (two) times daily. Reported on 08/29/2015    . denosumab (PROLIA) 60 MG/ML SOLN injection Inject 60 mg into the skin every 6 (six) months. Administer in upper arm, thigh, or abdomen    . ELIQUIS 2.5 MG TABS tablet TAKE 1 TABLET BY MOUTH TWICE DAILY. 60 tablet 2  . LORazepam (ATIVAN) 0.5 MG tablet TAKE (1) TABLET TWICE DAILY AS NEEDED FOR ANXIETY. 30 tablet 2  . Multiple Vitamin (MULTIVITAMIN) tablet Take 1 tablet by mouth daily. Reported on 08/29/2015    . ranitidine (ZANTAC) 150 MG tablet Take 1 tablet (150 mg total) by mouth at bedtime. 30 tablet 5   No facility-administered medications prior to visit.     ROS: Review of Systems  Constitutional: Negative for activity change, appetite change, chills, fatigue and unexpected weight change.  HENT: Negative for congestion, mouth sores and sinus pressure.   Eyes: Negative for visual disturbance.  Respiratory: Negative for cough and chest tightness.   Gastrointestinal: Negative for abdominal pain and nausea.  Genitourinary: Negative for difficulty urinating, frequency and vaginal pain.  Musculoskeletal: Negative for back pain and gait problem.  Skin: Negative for pallor and rash.  Neurological: Negative for dizziness, tremors, weakness, numbness and headaches.  Psychiatric/Behavioral: Negative for confusion and sleep disturbance.    Objective:  BP 122/76  (BP Location: Left Arm, Patient Position: Sitting, Cuff Size: Normal)   Pulse 61   Temp 98.2 F (36.8 C) (Oral)   Ht 5\' 2"  (1.575 m)   Wt 118 lb (53.5 kg)   SpO2 98%   BMI 21.58 kg/m   BP Readings from Last 3 Encounters:  01/20/18 122/76  12/04/17 118/74  09/16/17 110/74    Wt Readings from Last 3 Encounters:  01/20/18 118 lb (53.5 kg)  12/04/17 116 lb (52.6 kg)  09/16/17 120 lb (54.4 kg)    Physical Exam  Constitutional: She appears well-developed. No distress.  HENT:  Head: Normocephalic.  Right Ear: External ear normal.  Left Ear: External ear normal.  Nose: Nose normal.  Mouth/Throat: Oropharynx is clear and moist.  Eyes: Pupils are equal, round, and reactive to light. Conjunctivae are normal. Right eye exhibits no discharge. Left eye exhibits no discharge.  Neck: Normal range of motion. Neck supple. No JVD present. No tracheal deviation present. No thyromegaly present.  Cardiovascular: Normal rate, regular rhythm and normal heart sounds.  Pulmonary/Chest: No stridor. No respiratory distress. She has no wheezes.  Abdominal: Soft. Bowel sounds are normal. She exhibits no distension and no mass. There is no tenderness. There is no rebound and no guarding.  Musculoskeletal: She exhibits no edema or tenderness.  Lymphadenopathy:    She has no cervical adenopathy.  Neurological: She displays normal reflexes. No cranial nerve deficit. She exhibits normal muscle tone. Coordination normal.  Skin: No rash noted. No erythema.  Psychiatric: She has a normal mood and affect. Her behavior is normal. Judgment and thought content normal.    Lab Results  Component Value Date   WBC 7.1 09/16/2017   HGB 14.4 09/16/2017   HCT 43.5 09/16/2017   PLT 274.0 09/16/2017   GLUCOSE 101 (H) 09/16/2017   CHOL 210 (H) 02/07/2016   TRIG 119.0 02/07/2016   HDL 56.30 02/07/2016   LDLCALC 130 (H) 02/07/2016   ALT 9 06/04/2016   AST 15 06/04/2016   NA 138 09/16/2017   K 4.3 09/16/2017   CL  107 09/16/2017   CREATININE 0.87 09/16/2017   BUN 30 (H) 09/16/2017   CO2 26 09/16/2017   TSH 3.01 09/16/2017   INR 1.54 (H) 08/25/2015   HGBA1C 6.0 (H) 11/03/2014    No results found.  Assessment & Plan:   There are no diagnoses linked to this encounter.   No orders of the defined types were placed in this encounter.    Follow-up: No follow-ups on file.  Walker Kehr, MD

## 2018-01-20 NOTE — Assessment & Plan Note (Signed)
Lorazepam prn  Potential benefits of a long term benzodiazepines  use as well as potential risks  and complications were explained to the patient and were aknowledged.  

## 2018-01-20 NOTE — Assessment & Plan Note (Signed)
On Eliquis

## 2018-02-16 DIAGNOSIS — H524 Presbyopia: Secondary | ICD-10-CM | POA: Diagnosis not present

## 2018-02-17 DIAGNOSIS — L438 Other lichen planus: Secondary | ICD-10-CM | POA: Diagnosis not present

## 2018-02-17 DIAGNOSIS — Z85828 Personal history of other malignant neoplasm of skin: Secondary | ICD-10-CM | POA: Diagnosis not present

## 2018-02-17 DIAGNOSIS — L738 Other specified follicular disorders: Secondary | ICD-10-CM | POA: Diagnosis not present

## 2018-02-17 DIAGNOSIS — L570 Actinic keratosis: Secondary | ICD-10-CM | POA: Diagnosis not present

## 2018-03-02 ENCOUNTER — Encounter: Payer: Self-pay | Admitting: Internal Medicine

## 2018-03-02 ENCOUNTER — Ambulatory Visit: Payer: Medicare Other | Admitting: Internal Medicine

## 2018-03-02 ENCOUNTER — Ambulatory Visit (INDEPENDENT_AMBULATORY_CARE_PROVIDER_SITE_OTHER)
Admission: RE | Admit: 2018-03-02 | Discharge: 2018-03-02 | Disposition: A | Discharge status: Home / Self Care | Payer: Medicare Other | Source: Ambulatory Visit | Attending: Internal Medicine | Admitting: Internal Medicine

## 2018-03-02 VITALS — BP 118/74 | HR 73 | Temp 97.5°F | Ht 62.0 in | Wt 117.0 lb

## 2018-03-02 DIAGNOSIS — J069 Acute upper respiratory infection, unspecified: Secondary | ICD-10-CM

## 2018-03-02 DIAGNOSIS — R6889 Other general symptoms and signs: Secondary | ICD-10-CM

## 2018-03-02 DIAGNOSIS — I2692 Saddle embolus of pulmonary artery without acute cor pulmonale: Secondary | ICD-10-CM

## 2018-03-02 DIAGNOSIS — R05 Cough: Secondary | ICD-10-CM | POA: Diagnosis not present

## 2018-03-02 LAB — POC INFLUENZA A&B (BINAX/QUICKVUE)
INFLUENZA A, POC: NEGATIVE
Influenza B, POC: NEGATIVE

## 2018-03-02 MED ORDER — AZITHROMYCIN 250 MG PO TABS: ORAL_TABLET | ORAL | 0 refills | Status: DC

## 2018-03-02 NOTE — Addendum Note (Signed)
Addended by: Karren Cobble on: 03/02/2018 12:46 PM   Modules accepted: Orders

## 2018-03-02 NOTE — Assessment & Plan Note (Signed)
CXR Zpac if worse 

## 2018-03-02 NOTE — Progress Notes (Signed)
Subjective:  Patient ID: Kristen Castillo, female    DOB: December 27, 1940  Age: 77 y.o. MRN: [id number]  CC: No chief complaint on file.   HPI Kristen Castillo presents for URI x 1 week. Pt is going to [place]. H/o pneumonia C/o ST, some cough    Outpatient Medications Prior to Visit  Medication Sig Dispense Refill  . budesonide (RHINOCORT ALLERGY) 32 MCG/ACT nasal spray Place 1 spray into both nostrils daily.    . calcium carbonate (OS-CAL) 600 MG TABS tablet Take 600 mg by mouth 2 (two) times daily with a meal. Reported on 08/29/2015    . Cholecalciferol (VITAMIN D3) 1.25 MG (50000 UT) CAPS Take 1 capsule by mouth every 30 (thirty) days. 3 capsule 3  . denosumab (PROLIA) 60 MG/ML SOLN injection Inject 60 mg into the skin every 6 (six) months. Administer in upper arm, thigh, or abdomen    . ELIQUIS 2.5 MG TABS tablet TAKE 1 TABLET BY MOUTH TWICE DAILY. 60 tablet 2  . famotidine (PEPCID) 20 MG tablet Take 1 tablet (20 mg total) by mouth 2 (two) times daily. 30 tablet 5  . LORazepam (ATIVAN) 0.5 MG tablet TAKE (1) TABLET TWICE DAILY AS NEEDED FOR ANXIETY. 30 tablet 2  . Multiple Vitamin (MULTIVITAMIN) tablet Take 1 tablet by mouth daily. Reported on 08/29/2015     No facility-administered medications prior to visit.     ROS: Review of Systems  Constitutional: Positive for chills and fatigue. Negative for activity change, appetite change, diaphoresis, fever and unexpected weight change.  HENT: Positive for congestion and sore throat. Negative for ear pain, facial swelling, hearing loss, mouth sores, nosebleeds, postnasal drip, rhinorrhea, sinus pressure, sneezing, tinnitus and trouble swallowing.   Eyes: Negative for pain, discharge, redness, itching and visual disturbance.  Respiratory: Positive for cough. Negative for chest tightness, shortness of breath, wheezing and stridor.   Cardiovascular: Negative for chest pain, palpitations and leg swelling.  Gastrointestinal: Negative for  abdominal distention, anal bleeding, blood in stool, constipation, diarrhea, nausea and rectal pain.  Genitourinary: Negative for difficulty urinating, dysuria, flank pain, frequency, genital sores, hematuria, pelvic pain, urgency, vaginal bleeding and vaginal discharge.  Musculoskeletal: Negative for arthralgias, back pain, gait problem, joint swelling, neck pain and neck stiffness.  Skin: Negative.  Negative for rash.  Neurological: Negative for dizziness, tremors, seizures, syncope, speech difficulty, weakness, numbness and headaches.  Hematological: Negative for adenopathy. Does not bruise/bleed easily.  Psychiatric/Behavioral: Negative for behavioral problems, decreased concentration, dysphoric mood, sleep disturbance and suicidal ideas. The patient is not nervous/anxious.     Objective:  There were no vitals taken for this visit.  BP Readings from Last 3 Encounters:  01/20/18 122/76  12/04/17 118/74  09/16/17 110/74    Wt Readings from Last 3 Encounters:  01/20/18 118 lb (53.5 kg)  12/04/17 116 lb (52.6 kg)  09/16/17 120 lb (54.4 kg)    Physical Exam Constitutional:      General: She is not in acute distress.    Appearance: She is well-developed.  HENT:     Head: Normocephalic.     Right Ear: Tympanic membrane and external ear normal.     Left Ear: Tympanic membrane and external ear normal.     Nose: Congestion and rhinorrhea present.     Mouth/Throat:     Pharynx: Posterior oropharyngeal erythema present.  Eyes:     General:        Right eye: No discharge.  Left eye: No discharge.     Conjunctiva/sclera: Conjunctivae normal.     Pupils: Pupils are equal, round, and reactive to light.  Neck:     Musculoskeletal: Normal range of motion and neck supple.     Thyroid: No thyromegaly.     Vascular: No JVD.     Trachea: No tracheal deviation.  Cardiovascular:     Rate and Rhythm: Normal rate and regular rhythm.     Heart sounds: Normal heart sounds.  Pulmonary:       Effort: No respiratory distress.     Breath sounds: No stridor. No wheezing or rhonchi.  Abdominal:     General: Bowel sounds are normal. There is no distension.     Palpations: Abdomen is soft. There is no mass.     Tenderness: There is no abdominal tenderness. There is no guarding or rebound.  Musculoskeletal:        General: No tenderness.  Lymphadenopathy:     Cervical: No cervical adenopathy.  Skin:    Findings: No erythema or rash.  Neurological:     Cranial Nerves: No cranial nerve deficit.     Motor: No abnormal muscle tone.     Coordination: Coordination normal.     Deep Tendon Reflexes: Reflexes normal.  Psychiatric:        Behavior: Behavior normal.        Thought Content: Thought content normal.        Judgment: Judgment normal.     Lab Results  Component Value Date   WBC 6.7 01/20/2018   HGB 14.1 01/20/2018   HCT 42.5 01/20/2018   PLT 264.0 01/20/2018   GLUCOSE 102 (H) 01/20/2018   CHOL 210 (H) 02/07/2016   TRIG 119.0 02/07/2016   HDL 56.30 02/07/2016   LDLCALC 130 (H) 02/07/2016   ALT 9 06/04/2016   AST 15 06/04/2016   NA 139 01/20/2018   K 4.5 01/20/2018   CL 108 01/20/2018   CREATININE 0.95 01/20/2018   BUN 42 (H) 01/20/2018   CO2 24 01/20/2018   TSH 3.01 09/16/2017   INR 1.54 (H) 08/25/2015   HGBA1C 6.0 (H) 11/03/2014    Mm 3d Screen Breast Bilateral  Result Date: 01/20/2018 CLINICAL DATA:  Screening. EXAM: DIGITAL SCREENING BILATERAL MAMMOGRAM WITH TOMO AND CAD COMPARISON:  Previous exam(s). ACR Breast Density Category c: The breast tissue is heterogeneously dense, which may obscure small masses. FINDINGS: There are no findings suspicious for malignancy. Images were processed with CAD. IMPRESSION: No mammographic evidence of malignancy. A result letter of this screening mammogram will be mailed directly to the patient. RECOMMENDATION: Screening mammogram in one year. (Code:SM-B-01Y) BI-RADS CATEGORY  1: Negative. Electronically Signed   By:  Kristopher Oppenheim M.D.   On: 01/20/2018 14:57    Assessment & Plan:   There are no diagnoses linked to this encounter.   No orders of the defined types were placed in this encounter.    Follow-up: No follow-ups on file.  Walker Kehr, MD

## 2018-03-02 NOTE — Patient Instructions (Addendum)
You can use over-the-counter  "cold" medicines  such as "Tylenol cold" ,   "Mucinex" or" Mucinex D"  for cough and congestion.   Avoid decongestants if you have high blood pressure and use "Afrin" nasal spray for nasal congestion as directed. Use " Delsym" or" Robitussin" cough syrup varietis for cough.  You can use plain "Tylenol"  for fever, chills and achyness. Use Halls or Ricola cough drops.   "Common cold" symptoms are usually triggered by a virus.  The antibiotics are usually not necessary. On average, a" viral cold" illness would take 4-7 days to resolve.   Please, make an appointment if you are not better or if you're worse.

## 2018-03-02 NOTE — Assessment & Plan Note (Signed)
On Eliquis

## 2018-03-12 ENCOUNTER — Other Ambulatory Visit: Payer: Self-pay | Admitting: Internal Medicine

## 2018-03-12 NOTE — Telephone Encounter (Signed)
[place] Controlled Database Checked Last filled: 02/11/18 # 30 LOV w/you: 01/20/18 Next appt w/you: 05/28/18

## 2018-03-19 DIAGNOSIS — H903 Sensorineural hearing loss, bilateral: Secondary | ICD-10-CM | POA: Diagnosis not present

## 2018-03-25 ENCOUNTER — Telehealth: Payer: Self-pay | Admitting: Emergency Medicine

## 2018-03-25 DIAGNOSIS — Z8262 Family history of osteoporosis: Secondary | ICD-10-CM | POA: Diagnosis not present

## 2018-03-25 DIAGNOSIS — Z90710 Acquired absence of both cervix and uterus: Secondary | ICD-10-CM | POA: Diagnosis not present

## 2018-03-25 DIAGNOSIS — M810 Age-related osteoporosis without current pathological fracture: Secondary | ICD-10-CM | POA: Diagnosis not present

## 2018-03-25 LAB — HM DEXA SCAN

## 2018-03-25 NOTE — Telephone Encounter (Signed)
Called patient, unable to reach. Left detailed message stating that we are sending a message over to the provider for his recommendations. Will call back once we have them.  RB please advise patient is having a procedure done on 04/02/2018 for her cataract. She was told by her doctor to not take the medication the morning of. Please advise if this is ok or the time needs to be changed. Will send as a red message since this is time sensitive. Please advise, thank you.

## 2018-03-25 NOTE — Telephone Encounter (Signed)
Patient returned call, discussed RB's recommendations as stated below Patient expressed concern because Dr Mellissa Kohut office told her 1st that she would not have to stop her Eliquis, then changed that and told her that she would only need to hold it the day of the procedure on 1.23.2020.    Offered patient appt with this office since she has not been seen since 2018 >> appt scheduled with Beth NP for 1.17.2020 @ 1400 to discuss Eliquis for upcoming cataract surgery.  Nothing further needed at this time; will sign off.

## 2018-03-25 NOTE — Telephone Encounter (Signed)
I recommend that she stop the eliquis 2 days before the procedure (don't take 1/21, up until the procedure). Dr Satira Sark can then tell her how soon she can restart the eliquis once the procedure is completed, based on bleeding risk post-op

## 2018-03-25 NOTE — Telephone Encounter (Signed)
Called patient, unable to reach left message to give us a call back. 

## 2018-03-25 NOTE — Telephone Encounter (Signed)
Attempted to call Patient.  Left message for Patient to call back. 

## 2018-03-25 NOTE — Telephone Encounter (Signed)
Called and spoke with patient. She was calling back to let us know that she has been told by Dr. Mellissa Kohut office that she should not take her Eliquis,and then was called back, and told to take her Eliquis.  She would like Dr. Agustina Caroli opinion on what she should do.  Dr. Lamonte Sakai, please advise

## 2018-03-25 NOTE — Telephone Encounter (Signed)
Pt is calling back [phone]  Pt want's a detailed message left

## 2018-03-25 NOTE — Telephone Encounter (Signed)
Pt want's to know if she needs to take Eliquis she is having surgery next week.  [phone]  Please leave a detailed message

## 2018-03-27 ENCOUNTER — Ambulatory Visit: Payer: Medicare Other | Admitting: Primary Care

## 2018-03-27 ENCOUNTER — Encounter: Payer: Self-pay | Admitting: Primary Care

## 2018-03-27 VITALS — BP 124/66 | HR 74 | Temp 97.7°F | Ht 62.0 in | Wt 120.0 lb

## 2018-03-27 DIAGNOSIS — I2609 Other pulmonary embolism with acute cor pulmonale: Secondary | ICD-10-CM

## 2018-03-27 DIAGNOSIS — I2782 Chronic pulmonary embolism: Secondary | ICD-10-CM

## 2018-03-27 DIAGNOSIS — Z8719 Personal history of other diseases of the digestive system: Secondary | ICD-10-CM | POA: Diagnosis not present

## 2018-03-27 DIAGNOSIS — Z09 Encounter for follow-up examination after completed treatment for conditions other than malignant neoplasm: Secondary | ICD-10-CM | POA: Diagnosis not present

## 2018-03-27 NOTE — Assessment & Plan Note (Addendum)
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 She has been treated for > 2 years - Ok to hold Eliquis 2 days prior to cataract surgery, patient is a low risk for bleeding complication and for re-occurance blood clot off anticoagulation for 48 hours. Resume after procedure if ok per opthalmology  - Return for follow up with Dr. Lamonte Sakai to discuss duration/longevity of anticoagulation therapy

## 2018-03-27 NOTE — Patient Instructions (Addendum)
Hold Eliquis for 2 days prior to surgery starting 03/31/18. Resume as soon as possible if ok per opthalmology   Follow Dr. Lamonte Sakai in the next few months about anticoagulation duration   If develop worsening shortness of breath, chest pain, leg swelling return to pulmonary office

## 2018-03-27 NOTE — Progress Notes (Signed)
@Patient  ID: Kristen Castillo, female    DOB: 12-18-1940, 78 y.o.   MRN: [id number]  Chief Complaint  Patient presents with  . Follow-up    discuss stopping eliquis before surgery    Referring provider: Plotnikov, Evie Lacks, MD  HPI: 78 year old female, never smoked. PMH DVT leg, pulmonary embolism, allergic rhinitis, recurrent pneumonia, GERD. Patient of Dr. Lamonte Sakai, last seen on 11/20/2016. Per note, clot was provoked and could re-visit stopping anticoagulation treatment all together in the future. Recommended follow-up in 6 month which unfortunately patient states she was not aware about and was never set up.   03/27/2018 Patient presents today to discuss holding Eliquis prior to cataract surgery on 04/02/18. Accompanied by her husband. 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 Per Dr. Lamonte Sakai he recommends holding Eliquis for 2 days prior to procedure, starting on 1/21. She has some anxiety about procedure and coming off blood thinner. No hx afib. She has been on Eliquis 2.5mg  BID for over two years. Denies shortness of breath at rest. Occ dry cough. Answered all patients questions.    Allergies  Allergen Reactions  . Codeine Nausea And Vomiting  . Dilaudid [Hydromorphone Hcl] Other (See Comments)    "too strong - I can't wake up"  . Sulfonamide Derivatives Other (See Comments)    Childhood allergy; reaction unknown  . Tape Other (See Comments)    REDDNESS, BLISTER/  Use paper tape  . Norco [Hydrocodone-Acetaminophen] Rash    Immunization History  Administered Date(s) Administered  . H1N1 04/16/2008  . Influenza Whole 12/25/2009, 12/09/2011  . Influenza, High Dose Seasonal PF 12/26/2015, 01/01/2017  . Influenza,inj,Quad PF,6+ Mos 11/25/2014, 12/04/2017  . Influenza-Unspecified 03/01/2014  . Pneumococcal Conjugate-13 04/14/2013  . Pneumococcal Polysaccharide-23 03/11/2002, 12/25/2009, 09/16/2017  . Td 02/09/2009  . Zoster 02/17/2009    Past Medical  History:  Diagnosis Date  . Anxiety   . Cancer ([place])    hx skin cancer  . Complication of anesthesia   . Congestion of both ears   . Deviated nasal septum   . Diverticulitis   . DVT (deep venous thrombosis) ([place])   . GERD (gastroesophageal reflux disease)   . Hair loss   . Hematuria    in past  . History of creation of ostomy [facility])    past partial colectomy in 05/03/14  . History of skin cancer   . Kidney cysts   . Migraines    in past  . Molar pregnancy   . Molar pregnancy   . Osteoarthritis    fingers   . Osteoporosis   . Osteoporosis   . Persistent cough   . Pneumonia    july 2016, pneumonia-06/2015   . PONV (postoperative nausea and vomiting)   . Unspecified vitamin D deficiency   . Ventral hernia     Tobacco History: Social History   Tobacco Use  Smoking Status Never Smoker  Smokeless Tobacco Never Used   Counseling given: Not Answered   Outpatient Medications Prior to Visit  Medication Sig Dispense Refill  . budesonide (RHINOCORT ALLERGY) 32 MCG/ACT nasal spray Place 1 spray into both nostrils daily.    . calcium carbonate (OS-CAL) 600 MG TABS tablet Take 600 mg by mouth 2 (two) times daily with a meal. Reported on 08/29/2015    . Cholecalciferol (VITAMIN D3) 1.25 MG (50000 UT) CAPS Take 1 capsule by mouth every 30 (thirty) days. 3 capsule 3  . denosumab (PROLIA) 60 MG/ML SOLN injection  Inject 60 mg into the skin every 6 (six) months. Administer in upper arm, thigh, or abdomen    . ELIQUIS 2.5 MG TABS tablet TAKE 1 TABLET BY MOUTH TWICE DAILY. 60 tablet 2  . LORazepam (ATIVAN) 0.5 MG tablet TAKE (1) TABLET TWICE DAILY AS NEEDED FOR ANXIETY. 30 tablet 3  . Multiple Vitamin (MULTIVITAMIN) tablet Take 1 tablet by mouth daily. Reported on 08/29/2015    . azithromycin (ZITHROMAX Z-PAK) 250 MG tablet As directed 6 tablet 0  . famotidine (PEPCID) 20 MG tablet Take 1 tablet (20 mg total) by mouth 2 (two) times daily. (Patient not taking: Reported on 03/27/2018) 30  tablet 5   No facility-administered medications prior to visit.     Review of Systems  Review of Systems  Constitutional: Negative.   HENT: Negative.   Respiratory: Positive for cough. Negative for chest tightness, shortness of breath and wheezing.   Cardiovascular: Negative.     Physical Exam  BP 124/66 (BP Location: Left Arm, Cuff Size: Normal)   Pulse 74   Temp 97.7 F (36.5 C)   Ht 5\' 2"  (1.575 m)   Wt 120 lb (54.4 kg)   SpO2 100%   BMI 21.95 kg/m  Physical Exam Constitutional:      Appearance: She is normal weight.  HENT:     Head: Normocephalic and atraumatic.  Neck:     Musculoskeletal: Normal range of motion and neck supple.  Cardiovascular:     Rate and Rhythm: Normal rate and regular rhythm.  Pulmonary:     Effort: Pulmonary effort is normal.     Breath sounds: Normal breath sounds. No wheezing or rhonchi.     Comments: CTA Musculoskeletal: Normal range of motion.  Skin:    General: Skin is warm and dry.  Neurological:     General: No focal deficit present.     Mental Status: She is alert and oriented to person, place, and time. Mental status is at baseline.  Psychiatric:        Mood and Affect: Mood normal.        Behavior: Behavior normal.        Thought Content: Thought content normal.        Judgment: Judgment normal.      Lab Results:  CBC    Component Value Date/Time   WBC 6.7 01/20/2018 0940   RBC 4.70 01/20/2018 0940   HGB 14.1 01/20/2018 0940   HCT 42.5 01/20/2018 0940   PLT 264.0 01/20/2018 0940   MCV 90.4 01/20/2018 0940   MCV 89.8 02/04/2017 1638   MCH 29.6 02/04/2017 1638   MCH 26.9 08/26/2015 0550   MCHC 33.2 01/20/2018 0940   RDW 13.7 01/20/2018 0940   LYMPHSABS 1.6 01/20/2018 0940   MONOABS 0.6 01/20/2018 0940   EOSABS 0.2 01/20/2018 0940   BASOSABS 0.1 01/20/2018 0940    BMET    Component Value Date/Time   NA 139 01/20/2018 0940   K 4.5 01/20/2018 0940   CL 108 01/20/2018 0940   CO2 24 01/20/2018 0940    GLUCOSE 102 (H) 01/20/2018 0940   BUN 42 (H) 01/20/2018 0940   BUN 26 02/04/2017 1706   CREATININE 0.95 01/20/2018 0940   CALCIUM 9.3 01/20/2018 0940   GFRNONAA 49 (L) 08/25/2015 0500   GFRAA 57 (L) 08/25/2015 0500    BNP No results found for: BNP  ProBNP No results found for: PROBNP  Imaging: Dg Chest 2 View  Result Date: 03/02/2018 CLINICAL DATA:  Cough, congestion and chest tightness for 2 weeks. EXAM: CHEST - 2 VIEW COMPARISON:  Chest x-ray 08/15/2017 FINDINGS: The cardiac silhouette, mediastinal and hilar contours are normal and stable. The lungs are clear of an acute process. Lingular scarring changes are noted. Mild eventration of the right hemidiaphragm. No pleural effusions. The bony thorax is intact. IMPRESSION: No acute cardiopulmonary findings. Lingular scarring changes. Electronically Signed   By: Marijo Sanes M.D.   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 She has been treated for > 2 years - Ok to hold Eliquis 2 days prior to cataract surgery, patient is a low risk for bleeding complication and for re-occurance blood clot off anticoagulation for 48 hours. Resume after procedure if ok per opthalmology  - Return for follow up with Dr. Lamonte Sakai to discuss duration/longevity of anticoagulation therapy    Martyn Ehrich, NP 03/27/2018

## 2018-04-02 DIAGNOSIS — H25811 Combined forms of age-related cataract, right eye: Secondary | ICD-10-CM | POA: Diagnosis not present

## 2018-04-02 DIAGNOSIS — H25011 Cortical age-related cataract, right eye: Secondary | ICD-10-CM | POA: Diagnosis not present

## 2018-04-02 DIAGNOSIS — H2511 Age-related nuclear cataract, right eye: Secondary | ICD-10-CM | POA: Diagnosis not present

## 2018-04-10 ENCOUNTER — Other Ambulatory Visit: Payer: Self-pay | Admitting: Emergency Medicine

## 2018-04-21 DIAGNOSIS — H2000 Unspecified acute and subacute iridocyclitis: Secondary | ICD-10-CM | POA: Diagnosis not present

## 2018-04-21 DIAGNOSIS — H43812 Vitreous degeneration, left eye: Secondary | ICD-10-CM | POA: Diagnosis not present

## 2018-04-22 ENCOUNTER — Encounter: Payer: Self-pay | Admitting: Internal Medicine

## 2018-05-05 DIAGNOSIS — R311 Benign essential microscopic hematuria: Secondary | ICD-10-CM | POA: Diagnosis not present

## 2018-05-20 DIAGNOSIS — N200 Calculus of kidney: Secondary | ICD-10-CM | POA: Diagnosis not present

## 2018-05-20 DIAGNOSIS — R311 Benign essential microscopic hematuria: Secondary | ICD-10-CM | POA: Diagnosis not present

## 2018-05-21 ENCOUNTER — Ambulatory Visit: Payer: Medicare Other | Admitting: Internal Medicine

## 2018-05-22 DIAGNOSIS — H2000 Unspecified acute and subacute iridocyclitis: Secondary | ICD-10-CM | POA: Diagnosis not present

## 2018-05-26 ENCOUNTER — Encounter: Payer: Self-pay | Admitting: Emergency Medicine

## 2018-05-26 ENCOUNTER — Other Ambulatory Visit: Payer: Self-pay

## 2018-05-26 ENCOUNTER — Ambulatory Visit: Payer: Medicare Other | Admitting: Emergency Medicine

## 2018-05-26 DIAGNOSIS — I2692 Saddle embolus of pulmonary artery without acute cor pulmonale: Secondary | ICD-10-CM | POA: Diagnosis not present

## 2018-05-26 DIAGNOSIS — N200 Calculus of kidney: Secondary | ICD-10-CM | POA: Diagnosis not present

## 2018-05-26 DIAGNOSIS — R05 Cough: Secondary | ICD-10-CM | POA: Diagnosis not present

## 2018-05-26 DIAGNOSIS — N281 Cyst of kidney, acquired: Secondary | ICD-10-CM | POA: Diagnosis not present

## 2018-05-26 DIAGNOSIS — R311 Benign essential microscopic hematuria: Secondary | ICD-10-CM | POA: Diagnosis not present

## 2018-05-26 DIAGNOSIS — R059 Cough, unspecified: Secondary | ICD-10-CM

## 2018-05-26 NOTE — Patient Instructions (Addendum)
We will stay on Eliquis 2.5mg  twice a day.  Agree with taking famotidine every evening Follow Urology as planned. Forward [name] your notes to review.  Follow with Dr Lamonte Sakai in 6 months or sooner if you have any problems

## 2018-05-26 NOTE — Assessment & Plan Note (Signed)
Discussed the pros and cons of continuing prophylactic dose Eliquis with her.  She would like to stay on a possible.  Her clot was provoked and she is a candidate to come off should that become clinically necessary going forward.  She has had some hematuria which is currently being evaluated by urology.  We will need to discuss further.  She will send me the office notes from her upcoming visits.

## 2018-05-26 NOTE — Assessment & Plan Note (Signed)
With suspected contribution of GERD.  She is currently on famotidine, used to be on a PPI but this was changed given the risks profile for this class of medication.

## 2018-05-26 NOTE — Progress Notes (Signed)
   Subjective:    Patient ID: Kristen Castillo, female    DOB: [birth year], 78 y.o.   MRN: [id number]  HPI 78 year old never smoker known to me from recent hospitalization. 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 She had several abdominal hernias and required mesh placement in May 2017. I met her in June 2017 after she was admitted for a large right lower extremity DVT and associated saddle pulmonary embolism. She underwent targeted lytic therapy and was admitted to the ICU. She recovered well without any evidence of hypoxemia. Her echocardiogram initially showed evidence of right heart strain. She was discharged on Eliquis. She has been having cough since discharge home. Clear mucous. She has been on loratadine, rhinocort, restarted delsym and tessalon perles for a few months.   ROV 05/26/2018 --Kristen Castillo is 78.  She has a history of a remote bowel perforation that required colostomy and then was subsequently reversed.  Subsequently she experienced a large pulmonary embolism with hemodynamic instability for which she underwent targeted lytic therapy.  She was discharged on therapeutic Eliquis.  She has been on Eliquis 2.5 mg twice daily since.  She just underwent cataract surgery in late January, had her Eliquis held temporarily. Now back on 78m bid. She has some chronic hematuria and is being followed by Urology. I don't have those records yet. No other evidence bleeding.    Review of Systems As per HPI     Objective:   Physical Exam Vitals:   05/26/18 0905  BP: (!) 144/90  Pulse: 85  SpO2: 95%  Weight: 124 lb (56.2 kg)  Height: 5' 2" (1.575 m)    Gen: Pleasant, well-nourished, in no distress,  normal affect  ENT: No lesions,  mouth clear,  oropharynx clear, no postnasal drip  Neck: No JVD, no TMG, no carotid bruits  Lungs: No use of accessory muscles, small breaths, no wheeze.   Cardiovascular:  RRR, heart sounds normal, no murmur or gallops, R > L LE edema  Musculoskeletal: No deformities, no cyanosis or clubbing  Neuro: alert, non focal  Skin: Warm, no lesions or rashes      Assessment & Plan:  Pulmonary embolism ([facility]) Discussed the pros and cons of continuing prophylactic dose Eliquis with her.  She would like to stay on a possible.  Her clot was provoked and she is a candidate to come off should that become clinically necessary going forward.  She has had some hematuria which is currently being evaluated by urology.  We will need to discuss further.  She will send me the office notes from her upcoming visits.  Cough With suspected contribution of GERD.  She is currently on famotidine, used to be on a PPI but this was changed given the risks profile for this class of medication.  RBaltazar Apo MD, PhD 05/26/2018, 9:35 AM [facility] [phone] if no answer [phone]

## 2018-05-28 ENCOUNTER — Other Ambulatory Visit: Payer: Self-pay

## 2018-05-28 ENCOUNTER — Ambulatory Visit (INDEPENDENT_AMBULATORY_CARE_PROVIDER_SITE_OTHER)
Admission: RE | Admit: 2018-05-28 | Discharge: 2018-05-28 | Disposition: A | Discharge status: Home / Self Care | Payer: Medicare Other | Source: Ambulatory Visit | Attending: Internal Medicine | Admitting: Internal Medicine

## 2018-05-28 ENCOUNTER — Encounter: Payer: Self-pay | Admitting: Internal Medicine

## 2018-05-28 ENCOUNTER — Ambulatory Visit: Payer: Medicare Other | Admitting: Internal Medicine

## 2018-05-28 VITALS — BP 128/82 | HR 71 | Temp 97.5°F | Ht 62.0 in | Wt 124.0 lb

## 2018-05-28 DIAGNOSIS — M79671 Pain in right foot: Secondary | ICD-10-CM

## 2018-05-28 DIAGNOSIS — I2692 Saddle embolus of pulmonary artery without acute cor pulmonale: Secondary | ICD-10-CM | POA: Diagnosis not present

## 2018-05-28 DIAGNOSIS — M810 Age-related osteoporosis without current pathological fracture: Secondary | ICD-10-CM

## 2018-05-28 DIAGNOSIS — E559 Vitamin D deficiency, unspecified: Secondary | ICD-10-CM

## 2018-05-28 MED ORDER — TRIAMCINOLONE ACETONIDE 0.1 % EX OINT: 1.0000 "application " | TOPICAL_OINTMENT | Freq: Two times a day (BID) | CUTANEOUS | 2 refills | Status: DC

## 2018-05-28 MED ORDER — AZITHROMYCIN 250 MG PO TABS: ORAL_TABLET | ORAL | 0 refills | Status: DC

## 2018-05-28 NOTE — Assessment & Plan Note (Signed)
Vit D 

## 2018-05-28 NOTE — Progress Notes (Signed)
Subjective:  Patient ID: Kristen Castillo, female    DOB: 21-Jul-1940  Age: 78 y.o. MRN: [id number]  CC: No chief complaint on file.   HPI Kristen Castillo presents for PE, osteoporosis, anxiety f/u  Outpatient Medications Prior to Visit  Medication Sig Dispense Refill  . budesonide (RHINOCORT ALLERGY) 32 MCG/ACT nasal spray Place 1 spray into both nostrils daily.    . calcium carbonate (OS-CAL) 600 MG TABS tablet Take 600 mg by mouth 2 (two) times daily with a meal. Reported on 08/29/2015    . Cholecalciferol (VITAMIN D3) 1.25 MG (50000 UT) CAPS Take 1 capsule by mouth every 30 (thirty) days. 3 capsule 3  . denosumab (PROLIA) 60 MG/ML SOLN injection Inject 60 mg into the skin every 6 (six) months. Administer in upper arm, thigh, or abdomen    . ELIQUIS 2.5 MG TABS tablet TAKE 1 TABLET BY MOUTH TWICE DAILY. 60 tablet 5  . LORazepam (ATIVAN) 0.5 MG tablet TAKE (1) TABLET TWICE DAILY AS NEEDED FOR ANXIETY. 30 tablet 3  . Multiple Vitamin (MULTIVITAMIN) tablet Take 1 tablet by mouth daily. Reported on 08/29/2015     No facility-administered medications prior to visit.     ROS: Review of Systems  Constitutional: Negative for activity change, appetite change, chills, diaphoresis, fatigue, fever and unexpected weight change.  HENT: Negative for congestion, dental problem, ear pain, hearing loss, mouth sores, postnasal drip, sinus pressure, sneezing, sore throat and voice change.   Eyes: Negative for pain and visual disturbance.  Respiratory: Negative for cough, chest tightness, wheezing and stridor.   Cardiovascular: Negative for chest pain, palpitations and leg swelling.  Gastrointestinal: Negative for abdominal distention, abdominal pain, blood in stool, nausea, rectal pain and vomiting.  Genitourinary: Negative for decreased urine volume, difficulty urinating, dysuria, frequency, hematuria, menstrual problem, vaginal bleeding, vaginal discharge and vaginal pain.  Musculoskeletal:  Positive for arthralgias. Negative for back pain, gait problem, joint swelling and neck pain.  Skin: Negative for color change, rash and wound.  Neurological: Negative for dizziness, tremors, syncope, speech difficulty, weakness and light-headedness.  Hematological: Negative for adenopathy.  Psychiatric/Behavioral: Negative for behavioral problems, confusion, decreased concentration, dysphoric mood, hallucinations, sleep disturbance and suicidal ideas. The patient is not nervous/anxious and is not hyperactive.     Objective:  BP 128/82 (BP Location: Left Arm, Patient Position: Sitting, Cuff Size: Normal)   Pulse 71   Temp (!) 97.5 F (36.4 C) (Oral)   Ht 5\' 2"  (1.575 m)   Wt 124 lb (56.2 kg)   SpO2 97%   BMI 22.68 kg/m   BP Readings from Last 3 Encounters:  05/28/18 128/82  05/26/18 (!) 144/90  03/27/18 124/66    Wt Readings from Last 3 Encounters:  05/28/18 124 lb (56.2 kg)  05/26/18 124 lb (56.2 kg)  03/27/18 120 lb (54.4 kg)    Physical Exam Constitutional:      General: She is not in acute distress.    Appearance: She is well-developed.  HENT:     Head: Normocephalic.     Right Ear: External ear normal.     Left Ear: External ear normal.     Nose: Nose normal.  Eyes:     General:        Right eye: No discharge.        Left eye: No discharge.     Conjunctiva/sclera: Conjunctivae normal.     Pupils: Pupils are equal, round, and reactive to light.  Neck:     Musculoskeletal:  Normal range of motion and neck supple.     Thyroid: No thyromegaly.     Vascular: No JVD.     Trachea: No tracheal deviation.  Cardiovascular:     Rate and Rhythm: Normal rate and regular rhythm.     Heart sounds: Normal heart sounds.  Pulmonary:     Effort: No respiratory distress.     Breath sounds: No stridor. No wheezing.  Abdominal:     General: Bowel sounds are normal. There is no distension.     Palpations: Abdomen is soft. There is no mass.     Tenderness: There is no abdominal  tenderness. There is no guarding or rebound.  Musculoskeletal:        General: No tenderness.  Lymphadenopathy:     Cervical: No cervical adenopathy.  Skin:    Findings: No erythema or rash.  Neurological:     Cranial Nerves: No cranial nerve deficit.     Motor: No abnormal muscle tone.     Coordination: Coordination normal.     Deep Tendon Reflexes: Reflexes normal.  Psychiatric:        Behavior: Behavior normal.        Thought Content: Thought content normal.        Judgment: Judgment normal.     Lab Results  Component Value Date   WBC 6.7 01/20/2018   HGB 14.1 01/20/2018   HCT 42.5 01/20/2018   PLT 264.0 01/20/2018   GLUCOSE 102 (H) 01/20/2018   CHOL 210 (H) 02/07/2016   TRIG 119.0 02/07/2016   HDL 56.30 02/07/2016   LDLCALC 130 (H) 02/07/2016   ALT 9 06/04/2016   AST 15 06/04/2016   NA 139 01/20/2018   K 4.5 01/20/2018   CL 108 01/20/2018   CREATININE 0.95 01/20/2018   BUN 42 (H) 01/20/2018   CO2 24 01/20/2018   TSH 3.01 09/16/2017   INR 1.54 (H) 08/25/2015   HGBA1C 6.0 (H) 11/03/2014    Dg Chest 2 View  Result Date: 03/02/2018 CLINICAL DATA:  Cough, congestion and chest tightness for 2 weeks. EXAM: CHEST - 2 VIEW COMPARISON:  Chest x-ray 08/15/2017 FINDINGS: The cardiac silhouette, mediastinal and hilar contours are normal and stable. The lungs are clear of an acute process. Lingular scarring changes are noted. Mild eventration of the right hemidiaphragm. No pleural effusions. The bony thorax is intact. IMPRESSION: No acute cardiopulmonary findings. Lingular scarring changes. Electronically Signed   By: Marijo Sanes M.D.   On: 03/02/2018 15:59    Assessment & Plan:   There are no diagnoses linked to this encounter.   No orders of the defined types were placed in this encounter.    Follow-up: No follow-ups on file.  Walker Kehr, MD

## 2018-05-28 NOTE — Assessment & Plan Note (Addendum)
Prolia Core muscle exercises

## 2018-05-28 NOTE — Assessment & Plan Note (Signed)
X ray Podiatry ref if needed

## 2018-05-28 NOTE — Assessment & Plan Note (Signed)
Eliquis 

## 2018-06-08 ENCOUNTER — Telehealth: Payer: Medicare Other | Admitting: Family

## 2018-06-08 DIAGNOSIS — R05 Cough: Secondary | ICD-10-CM

## 2018-06-08 DIAGNOSIS — R059 Cough, unspecified: Secondary | ICD-10-CM

## 2018-06-08 DIAGNOSIS — Z7189 Other specified counseling: Secondary | ICD-10-CM

## 2018-06-08 MED ORDER — BENZONATATE 100 MG PO CAPS: 100.0000 mg | ORAL_CAPSULE | Freq: Three times a day (TID) | ORAL | 0 refills | Status: DC | PRN

## 2018-06-08 NOTE — Progress Notes (Signed)
E-Visit for Corona Virus Screening  Based on your current symptoms, you may very well have the virus, however your symptoms are mild. Currently, not all patients are being tested. If the symptoms are mild and there is not a known exposure, performing the test is not indicated.   Approximately 5 minutes was spent documenting and reviewing patient's chart.   Coronavirus disease 2019 (COVID-19) is a respiratory illness that can spread from person to person. The virus that causes COVID-19 is a new virus that was first identified in the country of [place] but is now found in multiple other countries and has spread to the [place].  Symptoms associated with the virus are mild to severe fever, cough, and shortness of breath. There is currently no vaccine to protect against COVID-19, and there is no specific antiviral treatment for the virus.   To be considered HIGH RISK for Coronavirus (COVID-19), you have to meet the following criteria:  . Traveled to [place], [place], [place], [place] or [place]; or in the [place] to [place], [place], [place], or [place]; and have fever, cough, and shortness of breath within the last 2 weeks of travel OR  . Been in close contact with a person diagnosed with COVID-19 within the last 2 weeks and have fever, cough, and shortness of breath  . IF YOU DO NOT MEET THESE CRITERIA, YOU ARE CONSIDERED LOW RISK FOR COVID-19.   It is vitally important that if you feel that you have an infection such as this virus or any other virus that you stay home and away from places where you may spread it to others.  You should self-quarantine for 14 days if you have symptoms that could potentially be coronavirus and avoid contact with people age 52 and older.   You can use medication such as A prescription cough medication called Tessalon Perles 100 mg. You may take 1-2 capsules every 8 hours as needed for cough.  You may also take acetaminophen (Tylenol) as needed for  fever.   Reduce your risk of any infection by using the same precautions used for avoiding the common cold or flu:  Marland Kitchen Wash your hands often with soap and warm water for at least 20 seconds.  If soap and water are not readily available, use an alcohol-based hand sanitizer with at least 60% alcohol.  . If coughing or sneezing, cover your mouth and nose by coughing or sneezing into the elbow areas of your shirt or coat, into a tissue or into your sleeve (not your hands). . Avoid shaking hands with others and consider head nods or verbal greetings only. . Avoid touching your eyes, nose, or mouth with unwashed hands.  . Avoid close contact with people who are sick. . Avoid places or events with large numbers of people in one location, like concerts or sporting events. . Carefully consider travel plans you have or are making. . If you are planning any travel outside or inside the [name], visit the CDC's Travelers' Health webpage for the latest health notices. . If you have some symptoms but not all symptoms, continue to monitor at home and seek medical attention if your symptoms worsen. . If you are having a medical emergency, call 911.  HOME CARE . Only take medications as instructed by your medical team. . Drink plenty of fluids and get plenty of rest. . A steam or ultrasonic humidifier can help if you have congestion.   GET HELP RIGHT AWAY IF: .  You develop worsening fever. . You become short of breath . You cough up blood. . Your symptoms become more severe MAKE SURE YOU   Understand these instructions.  Will watch your condition.  Will get help right away if you are not doing well or get worse.  Your e-visit answers were reviewed by a board certified advanced clinical practitioner to complete your personal care plan.  Depending on the condition, your plan could have included both over the counter or prescription medications.  If there is a problem please reply once you have received a  response from your provider. Your safety is important to [name].  If you have drug allergies check your prescription carefully.    You can use MyChart to ask questions about today's visit, request a non-urgent call back, or ask for a work or school excuse for 24 hours related to this e-Visit. If it has been greater than 24 hours you will need to follow up with your provider, or enter a new e-Visit to address those concerns. You will get an e-mail in the next two days asking about your experience.  I hope that your e-visit has been valuable and will speed your recovery. Thank you for using e-visits.

## 2018-06-25 ENCOUNTER — Encounter: Payer: Self-pay | Admitting: Internal Medicine

## 2018-06-25 ENCOUNTER — Other Ambulatory Visit: Payer: Self-pay | Admitting: Internal Medicine

## 2018-06-25 ENCOUNTER — Other Ambulatory Visit: Payer: Self-pay | Admitting: Family

## 2018-06-25 ENCOUNTER — Ambulatory Visit (INDEPENDENT_AMBULATORY_CARE_PROVIDER_SITE_OTHER): Payer: Medicare Other | Admitting: Internal Medicine

## 2018-06-25 DIAGNOSIS — R002 Palpitations: Secondary | ICD-10-CM | POA: Diagnosis not present

## 2018-06-25 DIAGNOSIS — R05 Cough: Secondary | ICD-10-CM

## 2018-06-25 DIAGNOSIS — I2692 Saddle embolus of pulmonary artery without acute cor pulmonale: Secondary | ICD-10-CM | POA: Diagnosis not present

## 2018-06-25 DIAGNOSIS — F419 Anxiety disorder, unspecified: Secondary | ICD-10-CM | POA: Diagnosis not present

## 2018-06-25 DIAGNOSIS — K5730 Diverticulosis of large intestine without perforation or abscess without bleeding: Secondary | ICD-10-CM

## 2018-06-25 DIAGNOSIS — Z7189 Other specified counseling: Secondary | ICD-10-CM

## 2018-06-25 DIAGNOSIS — R059 Cough, unspecified: Secondary | ICD-10-CM

## 2018-06-25 DIAGNOSIS — F439 Reaction to severe stress, unspecified: Secondary | ICD-10-CM

## 2018-06-25 MED ORDER — LORAZEPAM 0.5 MG PO TABS: 0.5000 mg | ORAL_TABLET | Freq: Two times a day (BID) | ORAL | 5 refills | Status: DC | PRN

## 2018-06-25 MED ORDER — SACCHAROMYCES BOULARDII 250 MG PO CAPS: 250.0000 mg | ORAL_CAPSULE | Freq: Two times a day (BID) | ORAL | 1 refills | Status: DC

## 2018-06-25 NOTE — Telephone Encounter (Signed)
RX giving to pt by e-visit, please advise about refill

## 2018-06-25 NOTE — Assessment & Plan Note (Signed)
Continue with Eliquis. ?

## 2018-06-25 NOTE — Assessment & Plan Note (Signed)
Worse.  Will increase lorazepam to twice a day.  I hope it will help with her palpitations too

## 2018-06-25 NOTE — Assessment & Plan Note (Signed)
New.  It appears to be a sinus tachycardia.  It could be related to anxiety.  However, will keep our eyes open for other potential problems.  Will increase lorazepam to twice a day.  I hope it will help with her palpitations

## 2018-06-25 NOTE — Assessment & Plan Note (Signed)
Change in stool consistency and odor.  Will try Florastor twice a day for a month

## 2018-06-25 NOTE — Assessment & Plan Note (Signed)
As above.

## 2018-06-25 NOTE — Progress Notes (Signed)
Virtual Visit via Telephone Note  I connected with Kristen Castillo on 06/25/18 at  8:30 AM EDT by telephone and verified that I am speaking with the correct person using two identifiers.   I discussed the limitations, risks, security and privacy concerns of performing an evaluation and management service by telephone and the availability of in person appointments. I also discussed with the patient that there may be a patient responsible charge related to this service. The patient expressed understanding and agreed to proceed.   History of Present Illness:   Kristen Castillo is complaining of a lot of anxiety and palpitations over past several days.  Kristen Castillo checked her pulse, it was regular with a heart rate of 90-100.  There is no palpitations at night when she takes her lorazepam.  No chest pain, no fever, no lightheadedness She complains of somewhat loose stools with strong odor  Observations/Objective:  Kristen Castillo is in no acute distress.  She looks well Assessment and Plan: COVID-19 prophylaxis discussed See plan Follow Up Instructions:    I discussed the assessment and treatment plan with the patient. The patient was provided an opportunity to ask questions and all were answered. The patient agreed with the plan and demonstrated an understanding of the instructions.   The patient was advised to call back or seek an in-person evaluation if the symptoms worsen or if the condition fails to improve as anticipated.  I provided 20 minutes of non-face-to-face time during this encounter.   Walker Kehr, MD

## 2018-06-29 DIAGNOSIS — H2000 Unspecified acute and subacute iridocyclitis: Secondary | ICD-10-CM | POA: Diagnosis not present

## 2018-07-14 ENCOUNTER — Other Ambulatory Visit: Payer: Self-pay | Admitting: Internal Medicine

## 2018-07-14 DIAGNOSIS — Z7189 Other specified counseling: Secondary | ICD-10-CM

## 2018-07-14 DIAGNOSIS — R059 Cough, unspecified: Secondary | ICD-10-CM

## 2018-07-14 DIAGNOSIS — R05 Cough: Secondary | ICD-10-CM

## 2018-07-27 DIAGNOSIS — H2000 Unspecified acute and subacute iridocyclitis: Secondary | ICD-10-CM | POA: Diagnosis not present

## 2018-08-11 ENCOUNTER — Other Ambulatory Visit: Payer: Self-pay | Admitting: Internal Medicine

## 2018-08-11 DIAGNOSIS — Z85828 Personal history of other malignant neoplasm of skin: Secondary | ICD-10-CM | POA: Diagnosis not present

## 2018-08-11 DIAGNOSIS — L578 Other skin changes due to chronic exposure to nonionizing radiation: Secondary | ICD-10-CM | POA: Diagnosis not present

## 2018-08-11 DIAGNOSIS — Z7189 Other specified counseling: Secondary | ICD-10-CM

## 2018-08-11 DIAGNOSIS — R059 Cough, unspecified: Secondary | ICD-10-CM

## 2018-08-11 DIAGNOSIS — L820 Inflamed seborrheic keratosis: Secondary | ICD-10-CM | POA: Diagnosis not present

## 2018-08-11 DIAGNOSIS — R05 Cough: Secondary | ICD-10-CM

## 2018-09-05 ENCOUNTER — Other Ambulatory Visit: Payer: Self-pay | Admitting: Internal Medicine

## 2018-09-05 DIAGNOSIS — R059 Cough, unspecified: Secondary | ICD-10-CM

## 2018-09-05 DIAGNOSIS — Z7189 Other specified counseling: Secondary | ICD-10-CM

## 2018-09-05 DIAGNOSIS — R05 Cough: Secondary | ICD-10-CM

## 2018-09-07 DIAGNOSIS — H2000 Unspecified acute and subacute iridocyclitis: Secondary | ICD-10-CM | POA: Diagnosis not present

## 2018-09-29 ENCOUNTER — Ambulatory Visit: Payer: Medicare Other | Admitting: Internal Medicine

## 2018-10-06 DIAGNOSIS — Z85828 Personal history of other malignant neoplasm of skin: Secondary | ICD-10-CM | POA: Diagnosis not present

## 2018-10-06 DIAGNOSIS — C44311 Basal cell carcinoma of skin of nose: Secondary | ICD-10-CM | POA: Diagnosis not present

## 2018-10-06 DIAGNOSIS — L578 Other skin changes due to chronic exposure to nonionizing radiation: Secondary | ICD-10-CM | POA: Diagnosis not present

## 2018-10-08 ENCOUNTER — Other Ambulatory Visit: Payer: Self-pay | Admitting: Internal Medicine

## 2018-10-08 ENCOUNTER — Other Ambulatory Visit: Payer: Self-pay | Admitting: Primary Care

## 2018-10-08 DIAGNOSIS — Z8719 Personal history of other diseases of the digestive system: Secondary | ICD-10-CM | POA: Diagnosis not present

## 2018-10-08 DIAGNOSIS — Z09 Encounter for follow-up examination after completed treatment for conditions other than malignant neoplasm: Secondary | ICD-10-CM | POA: Diagnosis not present

## 2018-10-08 DIAGNOSIS — R05 Cough: Secondary | ICD-10-CM

## 2018-10-08 DIAGNOSIS — R059 Cough, unspecified: Secondary | ICD-10-CM

## 2018-10-08 DIAGNOSIS — Z7189 Other specified counseling: Secondary | ICD-10-CM

## 2018-10-28 ENCOUNTER — Other Ambulatory Visit: Payer: Self-pay

## 2018-10-28 DIAGNOSIS — Z20822 Contact with and (suspected) exposure to covid-19: Secondary | ICD-10-CM

## 2018-10-28 DIAGNOSIS — R6889 Other general symptoms and signs: Secondary | ICD-10-CM | POA: Diagnosis not present

## 2018-10-29 LAB — NOVEL CORONAVIRUS, NAA: SARS-CoV-2, NAA: NOT DETECTED

## 2018-11-03 ENCOUNTER — Encounter: Payer: Self-pay | Admitting: Internal Medicine

## 2018-11-03 ENCOUNTER — Other Ambulatory Visit (INDEPENDENT_AMBULATORY_CARE_PROVIDER_SITE_OTHER): Payer: Medicare Other

## 2018-11-03 ENCOUNTER — Other Ambulatory Visit: Payer: Self-pay

## 2018-11-03 ENCOUNTER — Ambulatory Visit (INDEPENDENT_AMBULATORY_CARE_PROVIDER_SITE_OTHER): Payer: Medicare Other | Admitting: Internal Medicine

## 2018-11-03 VITALS — BP 140/80 | HR 69 | Temp 97.5°F | Ht 62.0 in | Wt 130.0 lb

## 2018-11-03 DIAGNOSIS — R002 Palpitations: Secondary | ICD-10-CM

## 2018-11-03 DIAGNOSIS — E559 Vitamin D deficiency, unspecified: Secondary | ICD-10-CM

## 2018-11-03 DIAGNOSIS — R05 Cough: Secondary | ICD-10-CM

## 2018-11-03 DIAGNOSIS — I2692 Saddle embolus of pulmonary artery without acute cor pulmonale: Secondary | ICD-10-CM

## 2018-11-03 DIAGNOSIS — E785 Hyperlipidemia, unspecified: Secondary | ICD-10-CM | POA: Diagnosis not present

## 2018-11-03 DIAGNOSIS — Z23 Encounter for immunization: Secondary | ICD-10-CM | POA: Diagnosis not present

## 2018-11-03 DIAGNOSIS — Z7189 Other specified counseling: Secondary | ICD-10-CM

## 2018-11-03 DIAGNOSIS — F439 Reaction to severe stress, unspecified: Secondary | ICD-10-CM

## 2018-11-03 DIAGNOSIS — R059 Cough, unspecified: Secondary | ICD-10-CM

## 2018-11-03 LAB — HEPATIC FUNCTION PANEL
ALT: 9 U/L (ref 0–35)
AST: 15 U/L (ref 0–37)
Albumin: 4.3 g/dL (ref 3.5–5.2)
Alkaline Phosphatase: 68 U/L (ref 39–117)
Bilirubin, Direct: 0.1 mg/dL (ref 0.0–0.3)
Total Bilirubin: 0.6 mg/dL (ref 0.2–1.2)
Total Protein: 7.4 g/dL (ref 6.0–8.3)

## 2018-11-03 LAB — URINALYSIS, ROUTINE W REFLEX MICROSCOPIC
Bilirubin Urine: NEGATIVE
Ketones, ur: NEGATIVE
Leukocytes,Ua: NEGATIVE
Nitrite: NEGATIVE
Specific Gravity, Urine: 1.025 (ref 1.000–1.030)
Total Protein, Urine: 100 — AB
Urine Glucose: NEGATIVE
Urobilinogen, UA: 0.2 (ref 0.0–1.0)
pH: 5.5 (ref 5.0–8.0)

## 2018-11-03 LAB — CBC WITH DIFFERENTIAL/PLATELET
Basophils Absolute: 0 10*3/uL (ref 0.0–0.1)
Basophils Relative: 0.7 % (ref 0.0–3.0)
Eosinophils Absolute: 0.1 10*3/uL (ref 0.0–0.7)
Eosinophils Relative: 2 % (ref 0.0–5.0)
HCT: 41.3 % (ref 36.0–46.0)
Hemoglobin: 13.8 g/dL (ref 12.0–15.0)
Lymphocytes Relative: 17.5 % (ref 12.0–46.0)
Lymphs Abs: 1.3 10*3/uL (ref 0.7–4.0)
MCHC: 33.3 g/dL (ref 30.0–36.0)
MCV: 89.2 fl (ref 78.0–100.0)
Monocytes Absolute: 0.5 10*3/uL (ref 0.1–1.0)
Monocytes Relative: 6.8 % (ref 3.0–12.0)
Neutro Abs: 5.5 10*3/uL (ref 1.4–7.7)
Neutrophils Relative %: 73 % (ref 43.0–77.0)
Platelets: 255 10*3/uL (ref 150.0–400.0)
RBC: 4.63 Mil/uL (ref 3.87–5.11)
RDW: 14.1 % (ref 11.5–15.5)
WBC: 7.5 10*3/uL (ref 4.0–10.5)

## 2018-11-03 LAB — BASIC METABOLIC PANEL
BUN: 29 mg/dL — ABNORMAL HIGH (ref 6–23)
CO2: 23 mEq/L (ref 19–32)
Calcium: 9.7 mg/dL (ref 8.4–10.5)
Chloride: 106 mEq/L (ref 96–112)
Creatinine, Ser: 0.95 mg/dL (ref 0.40–1.20)
GFR: 56.91 mL/min — ABNORMAL LOW (ref >60.00)
Glucose, Bld: 104 mg/dL — ABNORMAL HIGH (ref 70–99)
Potassium: 3.8 mEq/L (ref 3.5–5.1)
Sodium: 138 mEq/L (ref 135–145)

## 2018-11-03 LAB — LIPID PANEL
Cholesterol: 208 mg/dL — ABNORMAL HIGH (ref 0–200)
HDL: 55.8 mg/dL (ref >39.00)
LDL Cholesterol: 117 mg/dL — ABNORMAL HIGH (ref 0–99)
NonHDL: 152.37
Total CHOL/HDL Ratio: 4
Triglycerides: 176 mg/dL — ABNORMAL HIGH (ref 0.0–149.0)
VLDL: 35.2 mg/dL (ref 0.0–40.0)

## 2018-11-03 LAB — TSH: TSH: 3.28 u[IU]/mL (ref 0.35–4.50)

## 2018-11-03 LAB — VITAMIN D 25 HYDROXY (VIT D DEFICIENCY, FRACTURES): VITD: 65.91 ng/mL (ref 30.00–100.00)

## 2018-11-03 MED ORDER — LORAZEPAM 0.5 MG PO TABS: 0.5000 mg | ORAL_TABLET | Freq: Two times a day (BID) | ORAL | 5 refills | Status: DC | PRN

## 2018-11-03 MED ORDER — BENZONATATE 100 MG PO CAPS: ORAL_CAPSULE | ORAL | 0 refills | Status: DC

## 2018-11-03 NOTE — Progress Notes (Signed)
Subjective:  Patient ID: Kristen Castillo, female    DOB: Jul 08, 1940  Age: 78 y.o. MRN: [id number]  CC: No chief complaint on file.   HPI Alyeska Boyette presents for anxiety, palpitations - resolved, PE f/u  Outpatient Medications Prior to Visit  Medication Sig Dispense Refill  . brimonidine-timolol (COMBIGAN) 0.2-0.5 % ophthalmic solution Apply 1 drop to eye 2 (two) times daily.    . budesonide (RHINOCORT ALLERGY) 32 MCG/ACT nasal spray Place 1 spray into both nostrils daily.    . calcium carbonate (OS-CAL) 600 MG TABS tablet Take 600 mg by mouth 2 (two) times daily with a meal. Reported on 08/29/2015    . Cholecalciferol (VITAMIN D3) 1.25 MG (50000 UT) CAPS Take 1 capsule by mouth every 30 (thirty) days. 3 capsule 3  . denosumab (PROLIA) 60 MG/ML SOLN injection Inject 60 mg into the skin every 6 (six) months. Administer in upper arm, thigh, or abdomen    . ELIQUIS 2.5 MG TABS tablet TAKE 1 TABLET BY MOUTH TWICE DAILY. 60 tablet 0  . famotidine (PEPCID) 40 MG tablet     . LORazepam (ATIVAN) 0.5 MG tablet Take 1 tablet (0.5 mg total) by mouth 2 (two) times daily as needed for anxiety. 60 tablet 5  . Multiple Vitamin (MULTIVITAMIN) tablet Take 1 tablet by mouth daily. Reported on 08/29/2015    . saccharomyces boulardii (FLORASTOR) 250 MG capsule Take 1 capsule (250 mg total) by mouth 2 (two) times daily. 60 capsule 1  . triamcinolone cream (KENALOG) 0.1 %     . triamcinolone ointment (KENALOG) 0.1 % Apply 1 application topically 2 (two) times daily. 60 g 2  . benzonatate (TESSALON) 100 MG capsule TAKE  (1)  CAPSULE THREE TIMES DAILY AS NEEDED. (Patient not taking: Reported on 11/03/2018) 30 capsule 0  . azithromycin (ZITHROMAX Z-PAK) 250 MG tablet As directed (Patient not taking: Reported on 11/03/2018) 6 tablet 0   No facility-administered medications prior to visit.     ROS: Review of Systems  Constitutional: Negative for activity change, appetite change, chills, fatigue and  unexpected weight change.  HENT: Negative for congestion, mouth sores and sinus pressure.   Eyes: Negative for visual disturbance.  Respiratory: Positive for cough. Negative for chest tightness and shortness of breath.   Gastrointestinal: Negative for abdominal pain and nausea.  Genitourinary: Negative for difficulty urinating, frequency and vaginal pain.  Musculoskeletal: Negative for back pain and gait problem.  Skin: Negative for pallor and rash.  Neurological: Negative for dizziness, tremors, weakness, numbness and headaches.  Psychiatric/Behavioral: Negative for confusion, sleep disturbance and suicidal ideas.    Objective:  BP 140/80 (BP Location: Left Arm, Patient Position: Sitting, Cuff Size: Normal)   Pulse 69   Temp (!) 97.5 F (36.4 C) (Oral)   Ht 5\' 2"  (1.575 m)   Wt 130 lb (59 kg)   SpO2 97%   BMI 23.78 kg/m   BP Readings from Last 3 Encounters:  11/03/18 140/80  05/28/18 128/82  05/26/18 (!) 144/90    Wt Readings from Last 3 Encounters:  11/03/18 130 lb (59 kg)  05/28/18 124 lb (56.2 kg)  05/26/18 124 lb (56.2 kg)    Physical Exam Constitutional:      General: She is not in acute distress.    Appearance: She is well-developed.  HENT:     Head: Normocephalic.     Right Ear: External ear normal.     Left Ear: External ear normal.     Nose:  Nose normal.  Eyes:     General:        Right eye: No discharge.        Left eye: No discharge.     Conjunctiva/sclera: Conjunctivae normal.     Pupils: Pupils are equal, round, and reactive to light.  Neck:     Musculoskeletal: Normal range of motion and neck supple.     Thyroid: No thyromegaly.     Vascular: No JVD.     Trachea: No tracheal deviation.  Cardiovascular:     Rate and Rhythm: Normal rate and regular rhythm.     Heart sounds: Normal heart sounds.  Pulmonary:     Effort: No respiratory distress.     Breath sounds: No stridor. No wheezing.  Abdominal:     General: Bowel sounds are normal. There  is no distension.     Palpations: Abdomen is soft. There is no mass.     Tenderness: There is no abdominal tenderness. There is no guarding or rebound.  Musculoskeletal:        General: No tenderness.  Lymphadenopathy:     Cervical: No cervical adenopathy.  Skin:    Findings: No erythema or rash.  Neurological:     Cranial Nerves: No cranial nerve deficit.     Motor: No abnormal muscle tone.     Coordination: Coordination normal.     Deep Tendon Reflexes: Reflexes normal.  Psychiatric:        Behavior: Behavior normal.        Thought Content: Thought content normal.        Judgment: Judgment normal.     Lab Results  Component Value Date   WBC 6.7 01/20/2018   HGB 14.1 01/20/2018   HCT 42.5 01/20/2018   PLT 264.0 01/20/2018   GLUCOSE 102 (H) 01/20/2018   CHOL 210 (H) 02/07/2016   TRIG 119.0 02/07/2016   HDL 56.30 02/07/2016   LDLCALC 130 (H) 02/07/2016   ALT 9 06/04/2016   AST 15 06/04/2016   NA 139 01/20/2018   K 4.5 01/20/2018   CL 108 01/20/2018   CREATININE 0.95 01/20/2018   BUN 42 (H) 01/20/2018   CO2 24 01/20/2018   TSH 3.01 09/16/2017   INR 1.54 (H) 08/25/2015   HGBA1C 6.0 (H) 11/03/2014    Dg Foot Complete Right  Result Date: 05/28/2018 CLINICAL DATA:  Right foot pain.  No known injury. EXAM: RIGHT FOOT COMPLETE - 3+ VIEW COMPARISON:  MRI 01/02/2006. FINDINGS: Diffuse degenerative change most prominent at the first MTP joint noted. No acute soft tissue bony abnormality identified. No evidence of fracture dislocation. No radiopaque foreign body. IMPRESSION: Diffuse degenerative change. Degenerative changes most prominent about the first MTP joint. No acute bony abnormality. Electronically Signed   By: Marcello Moores  Register   On: 05/28/2018 10:20    Assessment & Plan:   Diagnoses and all orders for this visit:  Need for influenza vaccination -     Flu Vaccine QUAD High Dose(Fluad)     No orders of the defined types were placed in this encounter.     Follow-up: No follow-ups on file.  Walker Kehr, MD

## 2018-11-03 NOTE — Assessment & Plan Note (Signed)
Resolved

## 2018-11-03 NOTE — Assessment & Plan Note (Signed)
On Eliquis

## 2018-11-03 NOTE — Assessment & Plan Note (Signed)
Discussed.

## 2018-11-03 NOTE — Assessment & Plan Note (Addendum)
Vit D monthly Labs

## 2018-11-09 ENCOUNTER — Other Ambulatory Visit: Payer: Self-pay | Admitting: Emergency Medicine

## 2018-11-11 DIAGNOSIS — Z85828 Personal history of other malignant neoplasm of skin: Secondary | ICD-10-CM | POA: Diagnosis not present

## 2018-11-11 DIAGNOSIS — H2000 Unspecified acute and subacute iridocyclitis: Secondary | ICD-10-CM | POA: Diagnosis not present

## 2018-11-11 DIAGNOSIS — L309 Dermatitis, unspecified: Secondary | ICD-10-CM | POA: Diagnosis not present

## 2018-12-06 ENCOUNTER — Other Ambulatory Visit: Payer: Self-pay | Admitting: Internal Medicine

## 2018-12-06 DIAGNOSIS — R059 Cough, unspecified: Secondary | ICD-10-CM

## 2018-12-06 DIAGNOSIS — R05 Cough: Secondary | ICD-10-CM

## 2018-12-06 DIAGNOSIS — Z7189 Other specified counseling: Secondary | ICD-10-CM

## 2018-12-08 ENCOUNTER — Other Ambulatory Visit: Payer: Self-pay

## 2018-12-09 ENCOUNTER — Ambulatory Visit: Payer: Medicare Other | Admitting: Gynecology

## 2018-12-09 ENCOUNTER — Encounter: Payer: Self-pay | Admitting: Gynecology

## 2018-12-09 VITALS — BP 122/76 | Ht 62.0 in | Wt 128.0 lb

## 2018-12-09 DIAGNOSIS — Z9189 Other specified personal risk factors, not elsewhere classified: Secondary | ICD-10-CM

## 2018-12-09 DIAGNOSIS — Z8759 Personal history of other complications of pregnancy, childbirth and the puerperium: Secondary | ICD-10-CM | POA: Diagnosis not present

## 2018-12-09 DIAGNOSIS — Z01419 Encounter for gynecological examination (general) (routine) without abnormal findings: Secondary | ICD-10-CM

## 2018-12-09 DIAGNOSIS — M810 Age-related osteoporosis without current pathological fracture: Secondary | ICD-10-CM

## 2018-12-09 DIAGNOSIS — N952 Postmenopausal atrophic vaginitis: Secondary | ICD-10-CM

## 2018-12-09 NOTE — Progress Notes (Signed)
    Xoey Peglow [birth year] [id number]        78 y.o.  G1P0010 for breast and pelvic exam.  Without gynecologic complaints  Past medical history,surgical history, problem list, medications, allergies, family history and social history were all reviewed and documented as reviewed in the EPIC chart.  ROS:  Performed with pertinent positives and negatives included in the history, assessment and plan.   Additional significant findings : None   Exam: [name] assistant Vitals:   12/09/18 0853  BP: 122/76  Weight: 128 lb (58.1 kg)  Height: 5\' 2"  (1.575 m)   Body mass index is 23.41 kg/m.  General appearance:  Normal affect, orientation and appearance. Skin: Grossly normal HEENT: Without gross lesions.  No cervical or supraclavicular adenopathy. Thyroid normal.  Lungs:  Clear without wheezing, rales or rhonchi Cardiac: RR, without RMG Abdominal:  Soft, nontender, without masses, guarding, rebound, organomegaly or hernia Breasts:  Examined lying and sitting without masses, retractions, discharge or axillary adenopathy. Pelvic:  Ext, BUS, Vagina: With atrophic changes  Adnexa: Without masses or tenderness    Anus and perineum: Normal   Rectovaginal: Normal sphincter tone without palpated masses or tenderness.    Assessment/Plan:  78 y.o. G1P0010 female for breast and pelvic exam.  Status post TAH for a molar pregnancy 1972.  Subsequent BSO for ovarian cysts.  1. Postmenopausal.  No significant menopausal symptoms. 2. History of molar pregnancy.  Has been following quantitative hCGs annually.  Feels more comfortable with continued annual hCGs.  Quantitative hCG ordered today. 3. Osteoporosis.  Actively followed by Dr. Alain Marion.  Currently on Prolia.  DEXA 03/2018.  Continue to follow-up with him for bone health. 4. Mammography coming due in November and I reminded her to schedule this.  Breast exam normal today. 5. Colonoscopy 2016.  Repeat at their recommended interval. 6.  Pap smear 2019.  No Pap smear done today.  No history of significant abnormal Pap smears.  We have discussed stop screening recommendations based on age and hysterectomy history.  Will readdress on an annual basis. 7. Health maintenance.  No routine lab work done as patient does this elsewhere.  Follow-up 1 year, sooner as needed.   Anastasio Auerbach MD, 9:24 AM 12/09/2018

## 2018-12-09 NOTE — Patient Instructions (Signed)
Office will notify you of your hCG results.  Follow-up in 1 year for annual exam

## 2018-12-10 ENCOUNTER — Other Ambulatory Visit: Payer: Self-pay | Admitting: Gynecology

## 2018-12-10 DIAGNOSIS — Z8759 Personal history of other complications of pregnancy, childbirth and the puerperium: Secondary | ICD-10-CM

## 2018-12-10 LAB — HCG, QUANTITATIVE, PREGNANCY: HCG, Total, QN: 7 m[IU]/mL

## 2018-12-14 ENCOUNTER — Ambulatory Visit: Payer: Medicare Other | Admitting: Psychology

## 2018-12-14 ENCOUNTER — Ambulatory Visit (INDEPENDENT_AMBULATORY_CARE_PROVIDER_SITE_OTHER): Payer: Medicare Other | Admitting: Psychology

## 2018-12-14 DIAGNOSIS — F4320 Adjustment disorder, unspecified: Secondary | ICD-10-CM

## 2018-12-16 ENCOUNTER — Other Ambulatory Visit: Payer: Self-pay

## 2018-12-16 DIAGNOSIS — Z20828 Contact with and (suspected) exposure to other viral communicable diseases: Secondary | ICD-10-CM | POA: Diagnosis not present

## 2018-12-16 DIAGNOSIS — Z20822 Contact with and (suspected) exposure to covid-19: Secondary | ICD-10-CM

## 2018-12-17 ENCOUNTER — Encounter: Payer: Self-pay | Admitting: Gynecology

## 2018-12-18 ENCOUNTER — Other Ambulatory Visit: Payer: Self-pay | Admitting: Family

## 2018-12-18 ENCOUNTER — Ambulatory Visit (INDEPENDENT_AMBULATORY_CARE_PROVIDER_SITE_OTHER): Payer: Medicare Other | Admitting: Family

## 2018-12-18 ENCOUNTER — Other Ambulatory Visit (INDEPENDENT_AMBULATORY_CARE_PROVIDER_SITE_OTHER): Payer: Medicare Other

## 2018-12-18 ENCOUNTER — Ambulatory Visit: Payer: Self-pay | Admitting: *Deleted

## 2018-12-18 ENCOUNTER — Encounter: Payer: Self-pay | Admitting: Family

## 2018-12-18 ENCOUNTER — Ambulatory Visit (INDEPENDENT_AMBULATORY_CARE_PROVIDER_SITE_OTHER)
Admission: RE | Admit: 2018-12-18 | Discharge: 2018-12-18 | Disposition: A | Discharge status: Home / Self Care | Payer: Medicare Other | Source: Ambulatory Visit | Attending: Family | Admitting: Family

## 2018-12-18 ENCOUNTER — Other Ambulatory Visit: Payer: Self-pay

## 2018-12-18 VITALS — BP 128/80 | HR 89 | Temp 97.7°F | Ht 62.0 in | Wt 127.0 lb

## 2018-12-18 DIAGNOSIS — R079 Chest pain, unspecified: Secondary | ICD-10-CM

## 2018-12-18 DIAGNOSIS — R0789 Other chest pain: Secondary | ICD-10-CM | POA: Diagnosis not present

## 2018-12-18 LAB — CBC WITH DIFFERENTIAL/PLATELET
Basophils Absolute: 0.1 10*3/uL (ref 0.0–0.1)
Basophils Relative: 1.1 % (ref 0.0–3.0)
Eosinophils Absolute: 0.1 10*3/uL (ref 0.0–0.7)
Eosinophils Relative: 1.7 % (ref 0.0–5.0)
HCT: 41.8 % (ref 36.0–46.0)
Hemoglobin: 13.9 g/dL (ref 12.0–15.0)
Lymphocytes Relative: 20.7 % (ref 12.0–46.0)
Lymphs Abs: 1.7 10*3/uL (ref 0.7–4.0)
MCHC: 33.3 g/dL (ref 30.0–36.0)
MCV: 90 fl (ref 78.0–100.0)
Monocytes Absolute: 0.6 10*3/uL (ref 0.1–1.0)
Monocytes Relative: 7.8 % (ref 3.0–12.0)
Neutro Abs: 5.5 10*3/uL (ref 1.4–7.7)
Neutrophils Relative %: 68.7 % (ref 43.0–77.0)
Platelets: 291 10*3/uL (ref 150.0–400.0)
RBC: 4.64 Mil/uL (ref 3.87–5.11)
RDW: 14.1 % (ref 11.5–15.5)
WBC: 8 10*3/uL (ref 4.0–10.5)

## 2018-12-18 LAB — COMPREHENSIVE METABOLIC PANEL
ALT: 10 U/L (ref 0–35)
AST: 15 U/L (ref 0–37)
Albumin: 4.3 g/dL (ref 3.5–5.2)
Alkaline Phosphatase: 70 U/L (ref 39–117)
BUN: 34 mg/dL — ABNORMAL HIGH (ref 6–23)
CO2: 27 mEq/L (ref 19–32)
Calcium: 10.2 mg/dL (ref 8.4–10.5)
Chloride: 106 mEq/L (ref 96–112)
Creatinine, Ser: 1.1 mg/dL (ref 0.40–1.20)
GFR: 48.03 mL/min — ABNORMAL LOW (ref >60.00)
Glucose, Bld: 94 mg/dL (ref 70–99)
Potassium: 3.6 mEq/L (ref 3.5–5.1)
Sodium: 141 mEq/L (ref 135–145)
Total Bilirubin: 0.6 mg/dL (ref 0.2–1.2)
Total Protein: 8 g/dL (ref 6.0–8.3)

## 2018-12-18 LAB — D-DIMER, QUANTITATIVE: D-Dimer, Quant: 0.33 mcg/mL FEU (ref <0.50)

## 2018-12-18 LAB — NOVEL CORONAVIRUS, NAA: SARS-CoV-2, NAA: NOT DETECTED

## 2018-12-18 MED ORDER — PANTOPRAZOLE SODIUM 40 MG PO TBEC: 40.0000 mg | DELAYED_RELEASE_TABLET | Freq: Every day | ORAL | 1 refills | Status: DC

## 2018-12-18 MED ORDER — AZITHROMYCIN 250 MG PO TABS: ORAL_TABLET | ORAL | 0 refills | Status: DC

## 2018-12-18 NOTE — Telephone Encounter (Signed)
Pt called with complaints of chest tightness and tiredness; they started 12/16/2018; her COVID test was negative; the pt says that she felt this way when she had pneumonia; the pt says that she would like to have a chest xray;  recommendations made per nurse triage protocol; she verbalized understanding; the pt sees Dr Alain Marion, [facility]; pt transferred to [facility] for scheduling.  Reason for Disposition . [1] MILD difficulty breathing (e.g., minimal/no SOB at rest, SOB with walking, pulse <100) AND [2] NEW-onset or WORSE than normal  Answer Assessment - Initial Assessment Questions 1. RESPIRATORY STATUS: "Describe your breathing?" (e.g., wheezing, shortness of breath, unable to speak, severe coughing)      Chest tightness 2. ONSET: "When did this breathing problem begin?"      12/16/2018 3. PATTERN "Does the difficult breathing come and go, or has it been constant since it started?"      constant 4. SEVERITY: "How bad is your breathing?" (e.g., mild, moderate, severe)    - MILD: No SOB at rest, mild SOB with walking, speaks normally in sentences, can lay down, no retractions, pulse < 100.    - MODERATE: SOB at rest, SOB with minimal exertion and prefers to sit, cannot lie down flat, speaks in phrases, mild retractions, audible wheezing, pulse 100-120.    - SEVERE: Very SOB at rest, speaks in single words, struggling to breathe, sitting hunched forward, retractions, pulse > 120      mild 5. RECURRENT SYMPTOM: "Have you had difficulty breathing before?" If so, ask: "When was the last time?" and "What happened that time?"      Yes, when had pneumonia 6. CARDIAC HISTORY: "Do you have any history of heart disease?" (e.g., heart attack, angina, bypass surgery, angioplasty)      no 7. LUNG HISTORY: "Do you have any history of lung disease?"  (e.g., pulmonary embolus, asthma, emphysema)   Pneumonia, saddle PE 8. CAUSE: "What do you think is causing the breathing problem?"     pneumonia 9. OTHER SYMPTOMS:  "Do you have any other symptoms? (e.g., dizziness, runny nose, cough, chest pain, fever)     no 10. PREGNANCY: "Is there any chance you are pregnant?" "When was your last menstrual period?"     n/a 11. TRAVEL: "Have you traveled out of the country in the last month?" (e.g., travel history, exposures)       no  Protocols used: BREATHING DIFFICULTY-A-AH

## 2018-12-18 NOTE — Progress Notes (Signed)
Kristen Castillo is a 78 y.o. female with the following history as recorded in EpicCare:  Patient Active Problem List   Diagnosis Date Noted  . Palpitations 06/25/2018  . Foot pain, right 05/28/2018  . Elevated BP without diagnosis of hypertension 06/16/2017  . Carotid bruit 11/07/2015  . Chest pain 08/23/2015  . DVT of leg (deep venous thrombosis) ([place]) 08/23/2015  . Pulmonary embolism ([place]) 08/23/2015  . Ventral hernia s/p laparoscopic repair with mesh 08/01/15 08/01/2015  . Elevated WBC count 07/05/2015  . Recurrent pneumonia 07/03/2015  . Upper respiratory infection 06/27/2015  . Hernia of abdominal wall 01/25/2015  . Abdominal pain, epigastric 10/04/2014  . Stress 10/04/2014  . Moderate malnutrition ([place]) 05/24/2014  . Anemia 05/24/2014  . Mild malnutrition ([place]) 05/08/2014  . Abdominal pain   . Dehydration   . Migraine without aura and without status migrainosus, not intractable   . Sigmoid diverticulitis 05/03/2014  . Difficulty tolerating colonoscopy bowel prep 04/29/2014  . Anxiety, mild 04/29/2014  . 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 Diverticulitis of colon with perforation 04/28/2014  . GERD (gastroesophageal reflux disease) 04/28/2014  . Cerumen impaction 03/21/2014  . Cough 03/21/2014  . Well adult exam 03/21/2014  . 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 Rectal discharge 09/07/2011  . Functional constipation 09/07/2011  . Nephrolithiasis 01/08/2011  . Diverticulosis of colon 01/08/2011  . HEMATURIA, MICROSCOPIC, HX OF 01/28/2008  . Vitamin D deficiency 04/01/2007  . HAIR LOSS 04/01/2007  . TREMOR 04/01/2007  . Allergic rhinitis 03/31/2007  . OSTEOARTHRITIS 03/31/2007  . MIGRAINES, HX OF 03/31/2007  . Osteoporosis 11/12/2006    Current Outpatient Medications  Medication Sig Dispense Refill  . benzonatate (TESSALON) 100 MG capsule TAKE  (1)  CAPSULE THREE TIMES DAILY AS NEEDED. 30 capsule 0  . brimonidine-timolol (COMBIGAN)  0.2-0.5 % ophthalmic solution Apply 1 drop to eye 2 (two) times daily.    . budesonide (RHINOCORT ALLERGY) 32 MCG/ACT nasal spray Place 1 spray into both nostrils daily.    . calcium carbonate (OS-CAL) 600 MG TABS tablet Take 600 mg by mouth 2 (two) times daily with a meal. Reported on 08/29/2015    . Cholecalciferol (VITAMIN D3) 1.25 MG (50000 UT) CAPS Take 1 capsule by mouth every 30 (thirty) days. 3 capsule 3  . denosumab (PROLIA) 60 MG/ML SOLN injection Inject 60 mg into the skin every 6 (six) months. Administer in upper arm, thigh, or abdomen    . ELIQUIS 2.5 MG TABS tablet TAKE 1 TABLET BY MOUTH TWICE DAILY. 60 tablet 5  . famotidine (PEPCID) 40 MG tablet     . LORazepam (ATIVAN) 0.5 MG tablet Take 1 tablet (0.5 mg total) by mouth 2 (two) times daily as needed for anxiety. 60 tablet 5  . Multiple Vitamin (MULTIVITAMIN) tablet Take 1 tablet by mouth daily. Reported on 08/29/2015    . saccharomyces boulardii (FLORASTOR) 250 MG capsule Take 1 capsule (250 mg total) by mouth 2 (two) times daily. 60 capsule 1  . triamcinolone ointment (KENALOG) 0.1 % Apply 1 application topically 2 (two) times daily. (Patient not taking: Reported on 12/09/2018) 60 g 2   No current facility-administered medications for this visit.     Allergies: Codeine, Dilaudid [hydromorphone hcl], Sulfonamide derivatives, Tape, and Norco [hydrocodone-acetaminophen]  Past Medical History:  Diagnosis Date  . Anxiety   . Cancer ([place])    hx skin cancer  . Complication of anesthesia   . Congestion of both ears   . Deviated nasal  septum   . Diverticulitis   . DVT (deep venous thrombosis) ([place])   . GERD (gastroesophageal reflux disease)   . Hair loss   . Hematuria    in past  . History of creation of ostomy [facility])    past partial colectomy in 05/03/14  . History of skin cancer   . Kidney cysts   . Migraines    in past  . Molar pregnancy   . Molar pregnancy   . Osteoarthritis    fingers   . Osteoporosis   .  Osteoporosis   . Persistent cough   . Pneumonia    july 2016, pneumonia-06/2015   . PONV (postoperative nausea and vomiting)   . Unspecified vitamin D deficiency   . Ventral hernia     Past Surgical History:  Procedure Laterality Date  . ABDOMINAL HYSTERECTOMY  1972  . FACIAL COSMETIC SURGERY  2009   [name] MD in [place]  . ILEO LOOP COLOSTOMY CLOSURE N/A 11/08/2014   Procedure: LAPAROSCOPIC COLOSTOMY CLOSURE WITH PARTIAL COLECTOMY;  Surgeon: [name], MD;  Location: [facility];  Service: General;  Laterality: N/A;  . INSERTION OF MESH N/A 08/01/2015   Procedure: INSERTION OF MESH;  Surgeon: [name], MD;  Location: [facility];  Service: General;  Laterality: N/A;  . LAPAROSCOPIC LYSIS OF ADHESIONS N/A 11/08/2014   Procedure: LAPAROSCOPIC LYSIS OF ADHESIONS FOR 90 MINUTES;  Surgeon: [name], MD;  Location: [facility];  Service: General;  Laterality: N/A;  . LAPAROSCOPIC LYSIS OF ADHESIONS N/A 08/01/2015   Procedure: LAPAROSCOPIC LYSIS OF ADHESIONS;  Surgeon: [name], MD;  Location: [facility];  Service: General;  Laterality: N/A;  . LAPAROSCOPIC PARTIAL COLECTOMY N/A 05/03/2014   Procedure: DIAGNOSTIC LAPAROSCOPY WITH DRAINAGE OF INTRA ABDOMINAL ABSCESS PARTIAL COLECTOMY ;  Surgeon: [name], MD;  Location: [facility];  Service: General;  Laterality: N/A;  supine position  . LAPAROTOMY N/A 05/03/2014   Procedure: EXPLORATORY LAPAROTOMY WITH HARTMAN PROCEDURE;  Surgeon: [name], MD;  Location: [facility];  Service: General;  Laterality: N/A;  . LIPOMA RESECTION     left scapula area  . OOPHORECTOMY  1976  . REDUCTION MAMMAPLASTY  1994  . RHINOPLASTY  1985  . TONSILLECTOMY    . VENTRAL HERNIA REPAIR N/A 08/01/2015   Procedure: LAPAROSCOPIC VENTRAL INCISIONAL  HERNIA;  Surgeon: [name], MD;  Location: [facility];  Service: General;  Laterality: N/A;    Family History  Problem Relation Age of Onset  . Arthritis Mother   . Colon cancer Mother 3  . Heart disease  Father   . Dementia Father   . Diabetes Sister   . Cancer Maternal Grandfather        Lung cancer  . Heart disease Maternal Grandmother   . Stomach cancer Neg Hx     Social History   Tobacco Use  . Smoking status: Never Smoker  . Smokeless tobacco: Never Used  Substance Use Topics  . 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      Objective:  Vitals:   12/18/18 1005  BP: 128/80  Pulse: 89  Temp: 97.7 F (36.5 C)  TempSrc: Oral  SpO2: 99%  Weight: 127 lb 0.6 oz (57.6 kg)  Height: 5' 2"  (1.575 m)    General: Well developed, well nourished, in no acute distress  Skin : Warm and dry.  Head: Normocephalic and atraumatic  Eyes: Sclera and conjunctiva clear; pupils round and reactive to light; extraocular movements intact  Ears: External normal; canals clear; tympanic membranes normal  Oropharynx: Pink, supple. No suspicious lesions  Neck: Supple without thyromegaly, adenopathy  Lungs: Respirations unlabored; clear to auscultation bilaterally without wheeze, rales, rhonchi  CVS exam: normal rate and regular rhythm.  Abdomen: Soft; nontender; nondistended; normoactive bowel sounds; no masses or hepatosplenomegaly  Musculoskeletal: No deformities; no active joint inflammation  Extremities: No edema, cyanosis, clubbing  Vessels: Symmetric bilaterally  Neurologic: Alert and oriented; speech intact; face symmetrical; moves all extremities well; CNII-XII intact without  focal deficit   Assessment:  1. Chest pain, unspecified type     Plan:  EKG in office does not show acute changes; will update labs and CXR today; ? If symptoms could be related to uncontrolled GERD- may need to consider trial of PPI; follow-up to be determined. Will order stat labs and CXR and call patient with results this afternoon.   No follow-ups on file.  Orders Placed This Encounter  Procedures  . DG Chest 2 View    Standing Status:   Future    Number of Occurrences:   1    Standing Expiration Date:   02/17/2020    Order Specific Question:   Reason for Exam (SYMPTOM  OR DIAGNOSIS REQUIRED)    Answer:   chest tightness/ concern for pneumonia  Negative COVID test on 10/7    Order Specific Question:   Preferred imaging location?    Answer:   [facility]    Order Specific Question:   Radiology Contrast Protocol - do NOT remove file path    Answer:   [email or web address]  . CBC w/Diff    Standing Status:   Future    Number of Occurrences:   1    Standing Expiration Date:   12/18/2019  . Comp Met (CMET)    Standing Status:   Future    Number of Occurrences:   1    Standing Expiration Date:   12/18/2019  . D-Dimer, Quantitative    Standing Status:   Future    Number of Occurrences:   1    Standing Expiration Date:   12/18/2019  . EKG 12-Lead    Requested Prescriptions    No prescriptions requested or ordered in this encounter

## 2018-12-21 ENCOUNTER — Other Ambulatory Visit: Payer: Self-pay | Admitting: Family

## 2018-12-21 DIAGNOSIS — R9389 Abnormal findings on diagnostic imaging of other specified body structures: Secondary | ICD-10-CM

## 2018-12-24 ENCOUNTER — Other Ambulatory Visit: Payer: Self-pay | Admitting: Gynecology

## 2018-12-24 DIAGNOSIS — Z1231 Encounter for screening mammogram for malignant neoplasm of breast: Secondary | ICD-10-CM

## 2018-12-25 ENCOUNTER — Telehealth: Payer: Self-pay | Admitting: Emergency Medicine

## 2018-12-25 NOTE — Telephone Encounter (Signed)
Per RB >> pt can follow dermatology's recommendations.  Spoke with pt. She is aware of this information. Nothing further was needed.

## 2018-12-25 NOTE — Telephone Encounter (Signed)
Spoke with pt. She spoke with her Dermatologist and was told that she didn't have to stop her Eliquis prior to her skin cancer removal. Pt is apprehensive about this and wants RB's recommendation. I have sent a message to RB to get his answer. Will follow up on message once I receive a response from him.

## 2018-12-25 NOTE — Telephone Encounter (Signed)
Spoke with pt. She is having a skin cancer removed on her nose on Monday. Pt is currently on Eliquis and is questioning if she needs to stop this medication prior to this procedure. Advised her that she would need to contact the office who is performing her procedure to see what their policy is on this. She is going to call them and if they advise her that we need to make this decision, she will call us back. Nothing further was needed at this time.

## 2018-12-28 DIAGNOSIS — C44311 Basal cell carcinoma of skin of nose: Secondary | ICD-10-CM | POA: Diagnosis not present

## 2019-01-06 DIAGNOSIS — Z85828 Personal history of other malignant neoplasm of skin: Secondary | ICD-10-CM | POA: Diagnosis not present

## 2019-01-06 DIAGNOSIS — C44311 Basal cell carcinoma of skin of nose: Secondary | ICD-10-CM | POA: Diagnosis not present

## 2019-01-06 DIAGNOSIS — L821 Other seborrheic keratosis: Secondary | ICD-10-CM | POA: Diagnosis not present

## 2019-01-11 ENCOUNTER — Other Ambulatory Visit: Payer: Medicare Other

## 2019-01-11 ENCOUNTER — Other Ambulatory Visit: Payer: Self-pay

## 2019-01-11 DIAGNOSIS — Z8759 Personal history of other complications of pregnancy, childbirth and the puerperium: Secondary | ICD-10-CM

## 2019-01-12 ENCOUNTER — Encounter: Payer: Self-pay | Admitting: Gynecology

## 2019-01-12 LAB — HCG, QUANTITATIVE, PREGNANCY: HCG, Total, QN: 7 m[IU]/mL

## 2019-01-14 ENCOUNTER — Other Ambulatory Visit: Payer: Self-pay

## 2019-01-14 DIAGNOSIS — Z8759 Personal history of other complications of pregnancy, childbirth and the puerperium: Secondary | ICD-10-CM

## 2019-01-18 ENCOUNTER — Other Ambulatory Visit: Payer: Self-pay

## 2019-01-18 ENCOUNTER — Ambulatory Visit (INDEPENDENT_AMBULATORY_CARE_PROVIDER_SITE_OTHER)
Admission: RE | Admit: 2019-01-18 | Discharge: 2019-01-18 | Disposition: A | Discharge status: Home / Self Care | Payer: Medicare Other | Source: Ambulatory Visit | Attending: Family | Admitting: Family

## 2019-01-18 DIAGNOSIS — R918 Other nonspecific abnormal finding of lung field: Secondary | ICD-10-CM | POA: Diagnosis not present

## 2019-01-18 DIAGNOSIS — R9389 Abnormal findings on diagnostic imaging of other specified body structures: Secondary | ICD-10-CM | POA: Diagnosis not present

## 2019-02-01 ENCOUNTER — Other Ambulatory Visit: Payer: Self-pay

## 2019-02-01 DIAGNOSIS — Z20822 Contact with and (suspected) exposure to covid-19: Secondary | ICD-10-CM

## 2019-02-02 LAB — NOVEL CORONAVIRUS, NAA: SARS-CoV-2, NAA: NOT DETECTED

## 2019-02-09 ENCOUNTER — Other Ambulatory Visit: Payer: Self-pay

## 2019-02-09 ENCOUNTER — Ambulatory Visit
Admission: RE | Admit: 2019-02-09 | Discharge: 2019-02-09 | Disposition: A | Discharge status: Home / Self Care | Payer: Medicare Other | Source: Ambulatory Visit | Attending: Gynecology | Admitting: Gynecology

## 2019-02-09 ENCOUNTER — Other Ambulatory Visit: Payer: Self-pay | Admitting: Internal Medicine

## 2019-02-09 DIAGNOSIS — R05 Cough: Secondary | ICD-10-CM

## 2019-02-09 DIAGNOSIS — Z1231 Encounter for screening mammogram for malignant neoplasm of breast: Secondary | ICD-10-CM

## 2019-02-09 DIAGNOSIS — Z7189 Other specified counseling: Secondary | ICD-10-CM

## 2019-02-09 DIAGNOSIS — R059 Cough, unspecified: Secondary | ICD-10-CM

## 2019-02-10 DIAGNOSIS — C44311 Basal cell carcinoma of skin of nose: Secondary | ICD-10-CM | POA: Diagnosis not present

## 2019-02-10 DIAGNOSIS — Z85828 Personal history of other malignant neoplasm of skin: Secondary | ICD-10-CM | POA: Diagnosis not present

## 2019-02-19 DIAGNOSIS — H1031 Unspecified acute conjunctivitis, right eye: Secondary | ICD-10-CM | POA: Diagnosis not present

## 2019-02-22 ENCOUNTER — Other Ambulatory Visit: Payer: Self-pay

## 2019-02-22 ENCOUNTER — Emergency Department (HOSPITAL_COMMUNITY)
Admission: EM | Admit: 2019-02-22 | Discharge: 2019-02-22 | Disposition: A | Discharge status: Home / Self Care | Payer: Medicare Other | Attending: Emergency Medicine | Admitting: Emergency Medicine

## 2019-02-22 ENCOUNTER — Encounter (HOSPITAL_COMMUNITY): Payer: Self-pay

## 2019-02-22 ENCOUNTER — Emergency Department (HOSPITAL_COMMUNITY): Payer: Medicare Other

## 2019-02-22 DIAGNOSIS — Z79899 Other long term (current) drug therapy: Secondary | ICD-10-CM | POA: Insufficient documentation

## 2019-02-22 DIAGNOSIS — N202 Calculus of kidney with calculus of ureter: Secondary | ICD-10-CM | POA: Insufficient documentation

## 2019-02-22 DIAGNOSIS — N200 Calculus of kidney: Secondary | ICD-10-CM | POA: Diagnosis not present

## 2019-02-22 DIAGNOSIS — N132 Hydronephrosis with renal and ureteral calculous obstruction: Secondary | ICD-10-CM | POA: Diagnosis not present

## 2019-02-22 DIAGNOSIS — R1032 Left lower quadrant pain: Secondary | ICD-10-CM | POA: Diagnosis present

## 2019-02-22 LAB — CBC WITH DIFFERENTIAL/PLATELET
Abs Immature Granulocytes: 0.05 10*3/uL (ref 0.00–0.07)
Basophils Absolute: 0.1 10*3/uL (ref 0.0–0.1)
Basophils Relative: 0 %
Eosinophils Absolute: 0 10*3/uL (ref 0.0–0.5)
Eosinophils Relative: 0 %
HCT: 41.1 % (ref 36.0–46.0)
Hemoglobin: 13 g/dL (ref 12.0–15.0)
Immature Granulocytes: 0 %
Lymphocytes Relative: 8 %
Lymphs Abs: 1 10*3/uL (ref 0.7–4.0)
MCH: 29.2 pg (ref 26.0–34.0)
MCHC: 31.6 g/dL (ref 30.0–36.0)
MCV: 92.4 fL (ref 80.0–100.0)
Monocytes Absolute: 0.6 10*3/uL (ref 0.1–1.0)
Monocytes Relative: 5 %
Neutro Abs: 11.2 10*3/uL — ABNORMAL HIGH (ref 1.7–7.7)
Neutrophils Relative %: 87 %
Platelets: 270 10*3/uL (ref 150–400)
RBC: 4.45 MIL/uL (ref 3.87–5.11)
RDW: 13.1 % (ref 11.5–15.5)
WBC: 13 10*3/uL — ABNORMAL HIGH (ref 4.0–10.5)
nRBC: 0 % (ref 0.0–0.2)

## 2019-02-22 LAB — COMPREHENSIVE METABOLIC PANEL
ALT: 14 U/L (ref 0–44)
AST: 18 U/L (ref 15–41)
Albumin: 4 g/dL (ref 3.5–5.0)
Alkaline Phosphatase: 65 U/L (ref 38–126)
Anion gap: 10 (ref 5–15)
BUN: 47 mg/dL — ABNORMAL HIGH (ref 8–23)
CO2: 19 mmol/L — ABNORMAL LOW (ref 22–32)
Calcium: 9.3 mg/dL (ref 8.9–10.3)
Chloride: 109 mmol/L (ref 98–111)
Creatinine, Ser: 1.37 mg/dL — ABNORMAL HIGH (ref 0.44–1.00)
GFR calc Af Amer: 43 mL/min — ABNORMAL LOW (ref >60)
GFR calc non Af Amer: 37 mL/min — ABNORMAL LOW (ref >60)
Glucose, Bld: 156 mg/dL — ABNORMAL HIGH (ref 70–99)
Potassium: 4.3 mmol/L (ref 3.5–5.1)
Sodium: 138 mmol/L (ref 135–145)
Total Bilirubin: 0.8 mg/dL (ref 0.3–1.2)
Total Protein: 7.3 g/dL (ref 6.5–8.1)

## 2019-02-22 LAB — URINALYSIS, ROUTINE W REFLEX MICROSCOPIC
Bilirubin Urine: NEGATIVE
Glucose, UA: NEGATIVE mg/dL
Ketones, ur: NEGATIVE mg/dL
Leukocytes,Ua: NEGATIVE
Nitrite: NEGATIVE
Protein, ur: 30 mg/dL — AB
RBC / HPF: >50 RBC/hpf — ABNORMAL HIGH (ref 0–5)
Specific Gravity, Urine: 1.017 (ref 1.005–1.030)
pH: 5 (ref 5.0–8.0)

## 2019-02-22 LAB — LIPASE, BLOOD: Lipase: 35 U/L (ref 11–51)

## 2019-02-22 MED ORDER — MORPHINE SULFATE (PF) 2 MG/ML IV SOLN
2.0000 mg | Freq: Once | INTRAVENOUS | Status: AC
  Administered 2019-02-22: 08:00:00 2 mg via INTRAVENOUS
  Filled 2019-02-22: qty 1

## 2019-02-22 MED ORDER — ONDANSETRON HCL 4 MG/2ML IJ SOLN
4.0000 mg | Freq: Once | INTRAMUSCULAR | Status: AC
  Administered 2019-02-22: 4 mg via INTRAVENOUS
  Filled 2019-02-22: qty 2

## 2019-02-22 MED ORDER — ONDANSETRON 4 MG PO TBDP: 4.0000 mg | ORAL_TABLET | Freq: Three times a day (TID) | ORAL | 0 refills | Status: DC | PRN

## 2019-02-22 MED ORDER — CEPHALEXIN 500 MG PO CAPS
500.0000 mg | ORAL_CAPSULE | Freq: Once | ORAL | Status: AC
  Administered 2019-02-22: 11:00:00 500 mg via ORAL
  Filled 2019-02-22: qty 1

## 2019-02-22 MED ORDER — OXYCODONE-ACETAMINOPHEN 5-325 MG PO TABS: 1.0000 | ORAL_TABLET | ORAL | 0 refills | Status: DC | PRN

## 2019-02-22 MED ORDER — CEPHALEXIN 500 MG PO CAPS: 500.0000 mg | ORAL_CAPSULE | Freq: Four times a day (QID) | ORAL | 0 refills | Status: AC

## 2019-02-22 MED ORDER — SODIUM CHLORIDE 0.9 % IV BOLUS
1000.0000 mL | Freq: Once | INTRAVENOUS | Status: AC
  Administered 2019-02-22: 08:00:00 1000 mL via INTRAVENOUS

## 2019-02-22 MED ORDER — MORPHINE SULFATE (PF) 2 MG/ML IV SOLN
2.0000 mg | Freq: Once | INTRAVENOUS | Status: AC
  Administered 2019-02-22: 11:00:00 2 mg via INTRAVENOUS
  Filled 2019-02-22: qty 1

## 2019-02-22 NOTE — Discharge Instructions (Signed)
Take pain medications as directed for break through pain. Do not drive or operate machinery while taking this medication.   Take Zofran as needed for nausea/vomiting.  Take antibiotics as directed. Please take all of your antibiotics until finished.  Call the urology office tomorrow to return outpatient follow-up appointment.  Return the emergency department for any worsening pain, fevers, vomiting or any other worsening or concerning symptoms.

## 2019-02-22 NOTE — ED Triage Notes (Signed)
Patient arrived stating she woke up at 1am with left sided back pain that radiates around to flank. Declines any injury, numbness, or tingling.

## 2019-02-22 NOTE — ED Provider Notes (Signed)
[place] DEPT Provider Note   CSN: [id number] Arrival date & time: 02/22/19  [id number]     History Chief Complaint  Patient presents with  . Abdominal Pain  . Flank Pain    Kristen Castillo is a 78 y.o. female with past medical history of DVT, diverticulitis, kidney stones who presents for evaluation of acute left flank, abdomen and back pain that began about 1 AM.  Patient reports she had been in her normal state of health prior to onset of symptoms.  She states that she was woken up from sleep with this acute pain.  She reports that the pain was initially in her mid back and now has since radiated to her flank and abdomen.  She states all night, she tried to get comfortable but was unable to do so.  She states that pain has now started to radiate to the left abdomen.  She had one episode of vomiting while in the emergency department.  Emesis is nonbloody, nonbilious.  She states that her last bowel movement was yesterday and was normal.  She did have a small bowel movement earlier this morning after pain started.  She states that she has not noted any recent dysuria, hematuria.  She denies any chest pain, difficulty breathing, rash.  The history is provided by the patient.       Past Medical History:  Diagnosis Date  . Anxiety   . Cancer ([place])    hx skin cancer  . Complication of anesthesia   . Congestion of both ears   . Deviated nasal septum   . Diverticulitis   . DVT (deep venous thrombosis) ([place])   . GERD (gastroesophageal reflux disease)   . Hair loss   . Hematuria    in past  . History of creation of ostomy [facility])    past partial colectomy in 05/03/14  . History of skin cancer   . Kidney cysts   . Migraines    in past  . Molar pregnancy   . Molar pregnancy   . Osteoarthritis    fingers   . Osteoporosis   . Osteoporosis   . Persistent cough   . Pneumonia    july 2016, pneumonia-06/2015   . PONV (postoperative nausea and vomiting)    . Unspecified vitamin D deficiency   . Ventral hernia     Patient Active Problem List   Diagnosis Date Noted  . Palpitations 06/25/2018  . Foot pain, right 05/28/2018  . Elevated BP without diagnosis of hypertension 06/16/2017  . Carotid bruit 11/07/2015  . Chest pain 08/23/2015  . DVT of leg (deep venous thrombosis) ([place]) 08/23/2015  . Pulmonary embolism ([place]) 08/23/2015  . Ventral hernia s/p laparoscopic repair with mesh 08/01/15 08/01/2015  . Elevated WBC count 07/05/2015  . Recurrent pneumonia 07/03/2015  . Upper respiratory infection 06/27/2015  . Hernia of abdominal wall 01/25/2015  . Abdominal pain, epigastric 10/04/2014  . Stress 10/04/2014  . Moderate malnutrition ([place]) 05/24/2014  . Anemia 05/24/2014  . Mild malnutrition ([place]) 05/08/2014  . Abdominal pain   . Dehydration   . Migraine without aura and without status migrainosus, not intractable   . Sigmoid diverticulitis 05/03/2014  . Difficulty tolerating colonoscopy bowel prep 04/29/2014  . Anxiety, mild 04/29/2014  . 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 Diverticulitis of colon with perforation 04/28/2014  . GERD (gastroesophageal reflux disease) 04/28/2014  . Cerumen impaction 03/21/2014  . Cough 03/21/2014  . Well adult  exam 03/21/2014  . 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 Rectal discharge 09/07/2011  . Functional constipation 09/07/2011  . Nephrolithiasis 01/08/2011  . Diverticulosis of colon 01/08/2011  . HEMATURIA, MICROSCOPIC, HX OF 01/28/2008  . Vitamin D deficiency 04/01/2007  . HAIR LOSS 04/01/2007  . TREMOR 04/01/2007  . Allergic rhinitis 03/31/2007  . OSTEOARTHRITIS 03/31/2007  . MIGRAINES, HX OF 03/31/2007  . Osteoporosis 11/12/2006    Past Surgical History:  Procedure Laterality Date  . ABDOMINAL HYSTERECTOMY  1972  . FACIAL COSMETIC SURGERY  2009   [name] MD in [place]  . ILEO LOOP COLOSTOMY CLOSURE N/A 11/08/2014   Procedure: LAPAROSCOPIC COLOSTOMY CLOSURE  WITH PARTIAL COLECTOMY;  Surgeon: [name], MD;  Location: [facility] ORS;  Service: General;  Laterality: N/A;  . INSERTION OF MESH N/A 08/01/2015   Procedure: INSERTION OF MESH;  Surgeon: [name], MD;  Location: [facility];  Service: General;  Laterality: N/A;  . LAPAROSCOPIC LYSIS OF ADHESIONS N/A 11/08/2014   Procedure: LAPAROSCOPIC LYSIS OF ADHESIONS FOR 90 MINUTES;  Surgeon: [name], MD;  Location: [facility];  Service: General;  Laterality: N/A;  . LAPAROSCOPIC LYSIS OF ADHESIONS N/A 08/01/2015   Procedure: LAPAROSCOPIC LYSIS OF ADHESIONS;  Surgeon: [name], MD;  Location: [facility];  Service: General;  Laterality: N/A;  . LAPAROSCOPIC PARTIAL COLECTOMY N/A 05/03/2014   Procedure: DIAGNOSTIC LAPAROSCOPY WITH DRAINAGE OF INTRA ABDOMINAL ABSCESS PARTIAL COLECTOMY ;  Surgeon: [name], MD;  Location: [facility];  Service: General;  Laterality: N/A;  supine position  . LAPAROTOMY N/A 05/03/2014   Procedure: EXPLORATORY LAPAROTOMY WITH HARTMAN PROCEDURE;  Surgeon: [name], MD;  Location: [facility];  Service: General;  Laterality: N/A;  . LIPOMA RESECTION     left scapula area  . OOPHORECTOMY  1976  . REDUCTION MAMMAPLASTY  1994  . RHINOPLASTY  1985  . TONSILLECTOMY    . VENTRAL HERNIA REPAIR N/A 08/01/2015   Procedure: LAPAROSCOPIC VENTRAL INCISIONAL  HERNIA;  Surgeon: [name], MD;  Location: [facility];  Service: General;  Laterality: N/A;     OB History    Gravida  1   Para      Term      Preterm      AB  1   Living        SAB  1   TAB      Ectopic      Multiple      Live Births              Family History  Problem Relation Age of Onset  . Arthritis Mother   . Colon cancer Mother 43  . Heart disease Father   . Dementia Father   . Diabetes Sister   . Cancer Maternal Grandfather        Lung cancer  . Heart disease Maternal Grandmother   . Stomach cancer Neg Hx     Social History   Tobacco Use  . Smoking status: Never Smoker  .  Smokeless tobacco: Never Used  Substance Use Topics  . Alcohol use: No    Alcohol/week: 0.0 standard drinks  . Drug use: No    Home Medications Prior to Admission medications   Medication Sig Start Date End Date Taking? Authorizing Provider  benzonatate (TESSALON) 100 MG capsule TAKE  (1)  CAPSULE THREE TIMES DAILY AS NEEDED. Patient taking differently: Take 100 mg by mouth at bedtime.  02/10/19  Yes Plotnikov, Evie Lacks, MD  brimonidine-timolol (COMBIGAN) 0.2-0.5 % ophthalmic  solution Place 1 drop into the left eye daily.  09/09/17  Yes [provider]  budesonide (RHINOCORT ALLERGY) 32 MCG/ACT nasal spray Place 1 spray into both nostrils daily.   Yes [provider]  calcium carbonate (OS-CAL) 600 MG TABS tablet Take 600 mg by mouth daily.    Yes [provider]  Cholecalciferol (VITAMIN D3) 1.25 MG (50000 UT) CAPS Take 1 capsule by mouth every 30 (thirty) days. 01/20/18  Yes Plotnikov, Evie Lacks, MD  denosumab (PROLIA) 60 MG/ML SOLN injection Inject 60 mg into the skin every 6 (six) months. Administer in upper arm, thigh, or abdomen   Yes [provider]  ELIQUIS 2.5 MG TABS tablet TAKE 1 TABLET BY MOUTH TWICE DAILY. Patient taking differently: Take 2.5 mg by mouth 2 (two) times daily.  11/09/18  Yes Collene Gobble, MD  famotidine (PEPCID) 20 MG tablet Take 20 mg by mouth at bedtime.    Yes [provider]  LORazepam (ATIVAN) 0.5 MG tablet Take 1 tablet (0.5 mg total) by mouth 2 (two) times daily as needed for anxiety. Patient taking differently: Take 0.5 mg by mouth 2 (two) times daily.  11/03/18  Yes Plotnikov, Evie Lacks, MD  Multiple Vitamin (MULTIVITAMIN) tablet Take 1 tablet by mouth daily. Reported on 08/29/2015   Yes [provider]  azithromycin (ZITHROMAX) 250 MG tablet 2 tabs po qd x 1 day; 1 tablet per day x 4 days; Patient not taking: Reported on 02/22/2019 12/18/18   Marrian Salvage, FNP  cephALEXin (KEFLEX) 500 MG  capsule Take 1 capsule (500 mg total) by mouth 4 (four) times daily for 7 days. 02/22/19 03/01/19  Volanda Napoleon, PA-C  ondansetron (ZOFRAN ODT) 4 MG disintegrating tablet Take 1 tablet (4 mg total) by mouth every 8 (eight) hours as needed for nausea or vomiting. 02/22/19   Volanda Napoleon, PA-C  oxyCODONE-acetaminophen (PERCOCET/ROXICET) 5-325 MG tablet Take 1 tablet by mouth every 4 (four) hours as needed for severe pain. 02/22/19   Volanda Napoleon, PA-C  pantoprazole (PROTONIX) 40 MG tablet Take 1 tablet (40 mg total) by mouth daily. Patient not taking: Reported on 02/22/2019 12/18/18   Marrian Salvage, FNP  saccharomyces boulardii (FLORASTOR) 250 MG capsule Take 1 capsule (250 mg total) by mouth 2 (two) times daily. Patient not taking: Reported on 02/22/2019 06/25/18   Plotnikov, Evie Lacks, MD  triamcinolone ointment (KENALOG) 0.1 % Apply 1 application topically 2 (two) times daily. Patient not taking: Reported on 02/22/2019 05/28/18   Plotnikov, Evie Lacks, MD    Allergies    Codeine, Dilaudid [hydromorphone hcl], Sulfonamide derivatives, Tape, and Norco [hydrocodone-acetaminophen]  Review of Systems   Review of Systems  Constitutional: Negative for fever.  Respiratory: Negative for cough and shortness of breath.   Cardiovascular: Negative for chest pain.  Gastrointestinal: Positive for abdominal pain, nausea and vomiting.  Genitourinary: Positive for flank pain. Negative for dysuria and hematuria.  Neurological: Negative for headaches.  All other systems reviewed and are negative.   Physical Exam Updated Vital Signs BP (!) 128/59   Pulse 72   Temp 97.7 F (36.5 C) (Oral)   Resp 15   SpO2 97%   Physical Exam Vitals and nursing note reviewed.  Constitutional:      Appearance: Normal appearance. She is well-developed.  HENT:     Head: Normocephalic and atraumatic.  Eyes:     General: Lids are normal.     Conjunctiva/sclera: Conjunctivae normal.  Pupils:  Pupils are equal, round, and reactive to light.  Cardiovascular:     Rate and Rhythm: Normal rate and regular rhythm.     Pulses: Normal pulses.          Radial pulses are 2+ on the right side and 2+ on the left side.       Dorsalis pedis pulses are 2+ on the right side and 2+ on the left side.     Heart sounds: Normal heart sounds. No murmur. No friction rub. No gallop.   Pulmonary:     Effort: Pulmonary effort is normal.     Breath sounds: Normal breath sounds.     Comments: Lungs clear to auscultation bilaterally.  Symmetric chest rise.  No wheezing, rales, rhonchi. Abdominal:     General: Bowel sounds are decreased.     Palpations: Abdomen is soft. Abdomen is not rigid.     Tenderness: There is abdominal tenderness in the left lower quadrant. There is left CVA tenderness. There is no guarding.     Comments: Abdomen soft, nondistended.  Tenderness palpation of the left mid and lower abdomen.  Left-sided CVA tenderness noted.  No rigidity, guarding.  Slightly decreased bowel sounds.  Musculoskeletal:        General: Normal range of motion.     Cervical back: Full passive range of motion without pain.  Skin:    General: Skin is warm and dry.     Capillary Refill: Capillary refill takes less than 2 seconds.  Neurological:     Mental Status: She is alert and oriented to person, place, and time.  Psychiatric:        Speech: Speech normal.     ED Results / Procedures / Treatments   Labs (all labs ordered are listed, but only abnormal results are displayed) Labs Reviewed  URINALYSIS, ROUTINE W REFLEX MICROSCOPIC - Abnormal; Notable for the following components:      Result Value   APPearance CLOUDY (*)    Hgb urine dipstick LARGE (*)    Protein, ur 30 (*)    RBC / HPF >50 (*)    Bacteria, UA RARE (*)    All other components within normal limits  CBC WITH DIFFERENTIAL/PLATELET - Abnormal; Notable for the following components:   WBC 13.0 (*)    Neutro Abs 11.2 (*)    All other  components within normal limits  COMPREHENSIVE METABOLIC PANEL - Abnormal; Notable for the following components:   CO2 19 (*)    Glucose, Bld 156 (*)    BUN 47 (*)    Creatinine, Ser 1.37 (*)    GFR calc non Af Amer 37 (*)    GFR calc Af Amer 43 (*)    All other components within normal limits  URINE CULTURE  LIPASE, BLOOD    EKG None  Radiology CT Renal Stone Study  Result Date: 02/22/2019 CLINICAL DATA:  Flank pain with kidney stone suspected EXAM: CT ABDOMEN AND PELVIS WITHOUT CONTRAST TECHNIQUE: Multidetector CT imaging of the abdomen and pelvis was performed following the standard protocol without IV contrast. COMPARISON:  May 20, 2018 FINDINGS: Lower chest: Small sliding hiatal hernia. Mild atelectasis at the lung bases Hepatobiliary: 2.2 cm right hepatic cyst.Cholelithiasis. No evidence of acute cholecystitis. Pancreas: Unremarkable. Spleen: Granulomatous type calcifications. Three low-density lesions that have enlarged since 2016 but appear overall benign, the largest is also most anterior and measuring 2 cm. Adrenals/Urinary Tract: Negative adrenals. Bilateral renal sinus cysts with superimposed left hydronephrosis  due to a 6 x 3 mm stone in the proximal ureter. No right-sided hydronephrosis. There also bilateral cortical cysts including a 1 cm hyperdense right upper pole lesion that is stable and likely hemorrhagic cyst. Prior study was contrast-enhanced. Bilateral nephrolithiasis, more numerous on the left where there is a stone measuring up to 1 cm. Unremarkable bladder. Stomach/Bowel: Sigmoidectomy. No evidence of bowel inflammation or obstruction Vascular/Lymphatic: No acute vascular abnormality. No mass or adenopathy. Reproductive:Hysterectomy and pelvic floor laxity. Other: No ascites or pneumoperitoneum. Abdominal wall hernia repair with mesh. There is a fatty shallow right inguinal hernia. 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 Small S2  Tarlov cyst. No acute or aggressive finding. IMPRESSION: 1. Obstructing 6 x 3 mm stone in the proximal left ureter. 2. Left more than right nephrolithiasis including stone measuring 1 cm. 3. Renal hilar and cortical cysts. Electronically Signed   By: Monte Fantasia M.D.   On: 02/22/2019 09:35    Procedures Procedures (including critical care time)  Medications Ordered in ED Medications  ondansetron (ZOFRAN) injection 4 mg (4 mg Intravenous Given 02/22/19 0806)  sodium chloride 0.9 % bolus 1,000 mL (0 mLs Intravenous Stopped 02/22/19 0905)  morphine 2 MG/ML injection 2 mg (2 mg Intravenous Given 02/22/19 0806)  morphine 2 MG/ML injection 2 mg (2 mg Intravenous Given 02/22/19 1045)  ondansetron (ZOFRAN) injection 4 mg (4 mg Intravenous Given 02/22/19 1045)  cephALEXin (KEFLEX) capsule 500 mg (500 mg Oral Given 02/22/19 1044)    ED Course  I have reviewed the triage vital signs and the nursing notes.  Pertinent labs & imaging results that were available during my care of the patient were reviewed by me and considered in my medical decision making (see chart for details).    MDM Rules/Calculators/A&P       77 year old female who presents for evaluation of flank and abdominal pain that began acutely about 1 AM this morning.  She reports one episode of vomiting here in the emergency department.  No chest pain, difficulty breathing.  On initial ED arrival, she is afebrile, appears uncomfortable but no acute distress.  She is slightly hypertensive.  Vitals otherwise stable.  On exam, she has tenderness noted to left-sided CVA as well as left abdomen.  No rash.  Consider kidney stone versus infectious etiology such as diverticulitis.  Also consider bowel obstruction but she has had bowel movements.  Plan to check labs, urine.  CBC shows slight leukocytosis of 13.  Hemoglobin stable.  UA shows large hemoglobin.  She did have small amount of pyuria and bacteria noted. CMP shows BUN of 47 and Cr of  1.37. This is slightly above his baseline. Lipase unremarkable.   CT scan shows obstruction 6 mm proximal left ureter stone. She also has a 1 cm non-obstructing kidney stone in the left stone.   Discussed patient with Dr. Jeffie Pollock (Urology) who evaluated patient's labs and CT. He agrees with plan for discharge with antibiotics. Recommends outpatient follow-up.   Discussed with patient.  She reports improvement in pain after analgesics.  She has not any more vomiting since being here in the emergency department.  Patient is allergic to sulfa antibiotics.  She has tolerated cephalosporins in the past without any difficulty.  We will plan to send home with short course of pain medication.  She does not like Norco.  She states that most pain the medication makes her nauseous.  I reviewed and showed that she had been on Percocet.  We  discussed doing a short course of Percocet and may be doing half a pill.  Patient is agreeable.  Instructed patient that she will need to follow-up with urology. Discussed patient with Dr. Roderic Palau who is agreeable to plan. At this time, patient exhibits no emergent life-threatening condition that require further evaluation in ED or admission. Patient had ample opportunity for questions and discussion. All patient's questions were answered with full understanding. Strict return precautions discussed. Patient expresses understanding and agreement to plan.    Portions of this note were generated with [employer]. Dictation errors may occur despite best attempts at proofreading.  Final Clinical Impression(s) / ED Diagnoses Final diagnoses:  Kidney stone    Rx / DC Orders ED Discharge Orders         Ordered    cephALEXin (KEFLEX) 500 MG capsule  4 times daily     02/22/19 1038    oxyCODONE-acetaminophen (PERCOCET/ROXICET) 5-325 MG tablet  Every 4 hours PRN     02/22/19 1038    ondansetron (ZOFRAN ODT) 4 MG disintegrating tablet  Every 8 hours PRN     02/22/19  1038           Desma Mcgregor 02/22/19 1340    Milton Ferguson, MD 02/23/19 1030

## 2019-02-23 ENCOUNTER — Other Ambulatory Visit: Payer: Self-pay | Admitting: Urology

## 2019-02-23 DIAGNOSIS — N281 Cyst of kidney, acquired: Secondary | ICD-10-CM | POA: Diagnosis not present

## 2019-02-23 DIAGNOSIS — N202 Calculus of kidney with calculus of ureter: Secondary | ICD-10-CM | POA: Diagnosis not present

## 2019-02-24 LAB — URINE CULTURE
Culture: <10000 — AB
Special Requests: NORMAL

## 2019-02-25 ENCOUNTER — Other Ambulatory Visit: Payer: Self-pay

## 2019-02-25 ENCOUNTER — Encounter (HOSPITAL_COMMUNITY)
Admission: RE | Admit: 2019-02-25 | Discharge: 2019-02-25 | Disposition: A | Discharge status: Home / Self Care | Payer: Medicare Other | Source: Ambulatory Visit | Attending: Urology | Admitting: Urology

## 2019-02-25 ENCOUNTER — Encounter (HOSPITAL_COMMUNITY): Admission: RE | Admit: 2019-02-25 | Payer: Medicare Other | Source: Ambulatory Visit

## 2019-02-25 ENCOUNTER — Encounter (HOSPITAL_COMMUNITY): Payer: Self-pay

## 2019-02-25 ENCOUNTER — Other Ambulatory Visit (HOSPITAL_COMMUNITY)
Admission: RE | Admit: 2019-02-25 | Discharge: 2019-02-25 | Disposition: A | Discharge status: Home / Self Care | Payer: Medicare Other | Source: Ambulatory Visit | Attending: Urology | Admitting: Urology

## 2019-02-25 DIAGNOSIS — Z20828 Contact with and (suspected) exposure to other viral communicable diseases: Secondary | ICD-10-CM | POA: Diagnosis not present

## 2019-02-25 DIAGNOSIS — Z01812 Encounter for preprocedural laboratory examination: Secondary | ICD-10-CM | POA: Insufficient documentation

## 2019-02-25 HISTORY — DX: Personal history of urinary calculi: Z87.442

## 2019-02-25 LAB — SARS CORONAVIRUS 2 (TAT 6-24 HRS): SARS Coronavirus 2: NEGATIVE

## 2019-02-25 NOTE — Progress Notes (Signed)
PCP -Dr. Katherine Mantle  Cardiologist - no  Chest x-ray - 01/18/19 EKG - 12/18/18 Stress Test - no ECHO - no Cardiac Cath - no  Sleep Study - no CPAP -   Fasting Blood Sugar - NA Checks Blood Sugar _____ times a day  Blood Thinner Instructions :Eliquis Aspirin Instructions:Dr. Manny said to stop the day before Last Dose:02/26/19  Anesthesia review:   Patient denies shortness of breath, fever, cough and chest pain at PAT appointment yes  Patient verbalized understanding of instructions that were given to them at the PAT appointment. Patient was also instructed that they will need to review over the PAT instructions again at home before surgery. yes

## 2019-02-25 NOTE — Patient Instructions (Signed)
DUE TO COVID-19 ONLY ONE VISITOR IS ALLOWED TO COME WITH YOU AND STAY IN THE WAITING ROOM ONLY DURING PRE OP AND PROCEDURE DAY OF SURGERY. THE 1 VISITOR MAY VISIT WITH YOU AFTER SURGERY IN YOUR PRIVATE ROOM DURING VISITING HOURS ONLY!  YOU NEED TO HAVE A COVID 19 TEST ON_______ @_______ , THIS TEST MUST BE DONE BEFORE SURGERY, COME  [address] [place] , [id number].  ([place]) ONCE YOUR COVID TEST IS COMPLETED, PLEASE BEGIN THE QUARANTINE INSTRUCTIONS AS OUTLINED IN YOUR HANDOUT.                Kristen Castillo   Your procedure is scheduled on:    Report to [facility] Main  Entrance   Report to admitting at 7:00 AM     Call this number if you have problems the morning of surgery [phone]    Remember: Do not eat food or drink liquids :After Midnight.   BRUSH YOUR TEETH MORNING OF SURGERY AND RINSE YOUR MOUTH OUT, NO CHEWING GUM CANDY OR MINTS.     Take these medicines the morning of surgery with A SIP OF WATER: none                                 You may not have any metal on your body including hair pins and              piercings  Do not wear jewelry, make-up, lotions, powders or perfumes, deodorant             Do not wear nail polish on your fingernails.  Do not shave  48 hours prior to surgery.     Do not bring valuables to the hospital. [place].  Contacts, dentures or bridgework may not be worn into surgery.      Patients discharged the day of surgery will not be allowed to drive home. IF YOU ARE HAVING SURGERY AND GOING HOME THE SAME DAY, YOU MUST HAVE AN ADULT TO DRIVE YOU HOME AND BE WITH YOU FOR 24 HOURS. YOU MAY GO HOME BY TAXI OR UBER OR ORTHERWISE, BUT AN ADULT MUST ACCOMPANY YOU HOME AND STAY WITH YOU FOR 24 HOURS.  Name and phone number of your driver:  Special Instructions: N/A              Please read over the following fact sheets you were  given: _____________________________________________________________________             [id number] [facility] - Preparing for Surgery  Before surgery, you can play an important role.  Because skin is not sterile, your skin needs to be as free of germs as possible.  You can reduce the number of germs on your skin by washing with CHG (chlorahexidine gluconate) soap before surgery.  CHG is an antiseptic cleaner which kills germs and bonds with the skin to continue killing germs even after washing. Please DO NOT use if you have an allergy to CHG or antibacterial soaps.  If your skin becomes reddened/irritated stop using the CHG and inform your nurse when you arrive at Short Stay. Do not shave (including legs and underarms) for at least 48 hours prior to the first CHG shower.  You may shave your face/neck. Please follow these instructions carefully:  1.  Shower with CHG Soap the night before surgery and the  morning of Surgery.  2.  If you choose to wash your hair, wash your hair first as usual with your  normal  shampoo.  3.  After you shampoo, rinse your hair and body thoroughly to remove the  shampoo.                                        4.  Use CHG as you would any other liquid soap.  You can apply chg directly  to the skin and wash                       Gently with a scrungie or clean washcloth.  5.  Apply the CHG Soap to your body ONLY FROM THE NECK DOWN.   Do not use on face/ open                           Wound or open sores. Avoid contact with eyes, ears mouth and genitals (private parts).                       Wash face,  Genitals (private parts) with your normal soap.             6.  Wash thoroughly, paying special attention to the area where your surgery  will be performed.  7.  Thoroughly rinse your body with warm water from the neck down.  8.  DO NOT shower/wash with your normal soap after using and rinsing off  the CHG Soap.             9.  Pat yourself dry with a clean towel.             10.  Wear clean pajamas.            11.  Place clean sheets on your bed the night of your first shower and do not  sleep with pets. Day of Surgery : Do not apply any lotions/deodorants the morning of surgery.  Please wear clean clothes to the hospital/surgery center.  FAILURE TO FOLLOW THESE INSTRUCTIONS MAY RESULT IN THE CANCELLATION OF YOUR SURGERY PATIENT SIGNATURE_________________________________  NURSE SIGNATURE__________________________________  ________________________________________________________________________

## 2019-02-26 ENCOUNTER — Ambulatory Visit (HOSPITAL_COMMUNITY): Payer: Medicare Other | Admitting: Anesthesiology

## 2019-02-26 ENCOUNTER — Ambulatory Visit (HOSPITAL_COMMUNITY)
Admission: RE | Admit: 2019-02-26 | Discharge: 2019-02-26 | Disposition: A | Discharge status: Home / Self Care | Payer: Medicare Other | Attending: Urology | Admitting: Urology

## 2019-02-26 ENCOUNTER — Encounter (HOSPITAL_COMMUNITY)
Admission: RE | Disposition: A | Discharge status: Home / Self Care | Payer: Self-pay | Source: Home / Self Care | Attending: Urology

## 2019-02-26 ENCOUNTER — Encounter (HOSPITAL_COMMUNITY): Payer: Self-pay | Admitting: Urology

## 2019-02-26 ENCOUNTER — Ambulatory Visit (HOSPITAL_COMMUNITY): Payer: Medicare Other

## 2019-02-26 DIAGNOSIS — N202 Calculus of kidney with calculus of ureter: Secondary | ICD-10-CM | POA: Diagnosis not present

## 2019-02-26 DIAGNOSIS — Z9049 Acquired absence of other specified parts of digestive tract: Secondary | ICD-10-CM | POA: Insufficient documentation

## 2019-02-26 DIAGNOSIS — Z87442 Personal history of urinary calculi: Secondary | ICD-10-CM | POA: Diagnosis not present

## 2019-02-26 DIAGNOSIS — K219 Gastro-esophageal reflux disease without esophagitis: Secondary | ICD-10-CM | POA: Diagnosis not present

## 2019-02-26 DIAGNOSIS — Z85828 Personal history of other malignant neoplasm of skin: Secondary | ICD-10-CM | POA: Diagnosis not present

## 2019-02-26 DIAGNOSIS — N132 Hydronephrosis with renal and ureteral calculous obstruction: Secondary | ICD-10-CM | POA: Diagnosis not present

## 2019-02-26 DIAGNOSIS — Z86718 Personal history of other venous thrombosis and embolism: Secondary | ICD-10-CM | POA: Diagnosis not present

## 2019-02-26 DIAGNOSIS — F419 Anxiety disorder, unspecified: Secondary | ICD-10-CM | POA: Diagnosis not present

## 2019-02-26 DIAGNOSIS — N201 Calculus of ureter: Secondary | ICD-10-CM | POA: Diagnosis present

## 2019-02-26 DIAGNOSIS — N200 Calculus of kidney: Secondary | ICD-10-CM | POA: Diagnosis not present

## 2019-02-26 HISTORY — PX: CYSTOSCOPY/URETEROSCOPY/HOLMIUM LASER/STENT PLACEMENT: SHX6546

## 2019-02-26 SURGERY — CYSTOSCOPY/URETEROSCOPY/HOLMIUM LASER/STENT PLACEMENT
Anesthesia: General | Laterality: Left

## 2019-02-26 MED ORDER — ONDANSETRON 8 MG PO TBDP: 8.0000 mg | ORAL_TABLET | Freq: Three times a day (TID) | ORAL | 0 refills | Status: DC | PRN

## 2019-02-26 MED ORDER — ONDANSETRON HCL 4 MG/2ML IJ SOLN
INTRAMUSCULAR | Status: DC | PRN
  Administered 2019-02-26: 4 mg via INTRAVENOUS

## 2019-02-26 MED ORDER — MEPERIDINE HCL 50 MG/ML IJ SOLN: 6.2500 mg | INTRAMUSCULAR | Status: DC | PRN

## 2019-02-26 MED ORDER — ONDANSETRON HCL 4 MG/2ML IJ SOLN: 4.0000 mg | Freq: Once | INTRAMUSCULAR | Status: DC | PRN

## 2019-02-26 MED ORDER — FENTANYL CITRATE (PF) 100 MCG/2ML IJ SOLN: 25.0000 ug | INTRAMUSCULAR | Status: DC | PRN

## 2019-02-26 MED ORDER — PROPOFOL 10 MG/ML IV BOLUS
INTRAVENOUS | Status: AC
  Filled 2019-02-26: qty 20

## 2019-02-26 MED ORDER — GENTAMICIN SULFATE 40 MG/ML IJ SOLN
1.5000 mg/kg | INTRAVENOUS | Status: AC
  Administered 2019-02-26: 09:00:00 80 mg via INTRAVENOUS
  Filled 2019-02-26: qty 2

## 2019-02-26 MED ORDER — FENTANYL CITRATE (PF) 100 MCG/2ML IJ SOLN
INTRAMUSCULAR | Status: AC
  Filled 2019-02-26: qty 2

## 2019-02-26 MED ORDER — IOHEXOL 300 MG/ML  SOLN
INTRAMUSCULAR | Status: DC | PRN
  Administered 2019-02-26: 10 mL

## 2019-02-26 MED ORDER — MIDAZOLAM HCL 5 MG/5ML IJ SOLN
INTRAMUSCULAR | Status: DC | PRN
  Administered 2019-02-26: 1 mg via INTRAVENOUS

## 2019-02-26 MED ORDER — SODIUM CHLORIDE 0.9 % IR SOLN
Status: DC | PRN
  Administered 2019-02-26: 3000 mL

## 2019-02-26 MED ORDER — KETOROLAC TROMETHAMINE 10 MG PO TABS: 10.0000 mg | ORAL_TABLET | Freq: Three times a day (TID) | ORAL | 0 refills | Status: DC | PRN

## 2019-02-26 MED ORDER — MIDAZOLAM HCL 2 MG/2ML IJ SOLN
INTRAMUSCULAR | Status: AC
  Filled 2019-02-26: qty 2

## 2019-02-26 MED ORDER — ONDANSETRON HCL 4 MG/2ML IJ SOLN
INTRAMUSCULAR | Status: AC
  Filled 2019-02-26: qty 2

## 2019-02-26 MED ORDER — DEXAMETHASONE SODIUM PHOSPHATE 10 MG/ML IJ SOLN
INTRAMUSCULAR | Status: DC | PRN
  Administered 2019-02-26: 8 mg via INTRAVENOUS

## 2019-02-26 MED ORDER — LIDOCAINE HCL (CARDIAC) PF 100 MG/5ML IV SOSY
PREFILLED_SYRINGE | INTRAVENOUS | Status: DC | PRN
  Administered 2019-02-26: 100 mg via INTRAVENOUS

## 2019-02-26 MED ORDER — ONDANSETRON 4 MG PO TBDP: 4.0000 mg | ORAL_TABLET | Freq: Three times a day (TID) | ORAL | 0 refills | Status: DC | PRN

## 2019-02-26 MED ORDER — PROPOFOL 10 MG/ML IV BOLUS
INTRAVENOUS | Status: DC | PRN
  Administered 2019-02-26: 130 mg via INTRAVENOUS

## 2019-02-26 MED ORDER — LACTATED RINGERS IV SOLN: INTRAVENOUS | Status: DC

## 2019-02-26 MED ORDER — FENTANYL CITRATE (PF) 100 MCG/2ML IJ SOLN
INTRAMUSCULAR | Status: DC | PRN
  Administered 2019-02-26: 25 ug via INTRAVENOUS
  Administered 2019-02-26: 50 ug via INTRAVENOUS

## 2019-02-26 MED ORDER — GENTAMICIN SULFATE 40 MG/ML IJ SOLN: 5.0000 mg/kg | INTRAVENOUS | Status: DC

## 2019-02-26 SURGICAL SUPPLY — 23 items
BAG URO CATCHER STRL LF (MISCELLANEOUS) ×2 IMPLANT
BASKET LASER NITINOL 1.9FR (BASKET) ×1 IMPLANT
CATH INTERMIT  6FR 70CM (CATHETERS) ×2 IMPLANT
CLOTH BEACON ORANGE TIMEOUT ST (SAFETY) ×2 IMPLANT
EXTRACTOR STONE 1.7FRX115CM (UROLOGICAL SUPPLIES) IMPLANT
FIBER LASER FLEXIVA 1000 (UROLOGICAL SUPPLIES) IMPLANT
FIBER LASER FLEXIVA 365 (UROLOGICAL SUPPLIES) IMPLANT
FIBER LASER FLEXIVA 550 (UROLOGICAL SUPPLIES) IMPLANT
FIBER LASER TRAC TIP (UROLOGICAL SUPPLIES) ×1 IMPLANT
GLOVE BIOGEL M STRL SZ7.5 (GLOVE) ×2 IMPLANT
GOWN STRL REUS W/TWL LRG LVL3 (GOWN DISPOSABLE) ×2 IMPLANT
GUIDEWIRE ANG ZIPWIRE 038X150 (WIRE) ×2 IMPLANT
GUIDEWIRE STR DUAL SENSOR (WIRE) ×1 IMPLANT
KIT TURNOVER KIT A (KITS) IMPLANT
MANIFOLD NEPTUNE II (INSTRUMENTS) ×2 IMPLANT
PACK CYSTO (CUSTOM PROCEDURE TRAY) ×2 IMPLANT
PENCIL SMOKE EVACUATOR (MISCELLANEOUS) IMPLANT
SHEATH URETERAL 12FRX28CM (UROLOGICAL SUPPLIES) ×1 IMPLANT
SHEATH URETERAL 12FRX35CM (MISCELLANEOUS) IMPLANT
STENT POLARIS 5FRX24 (STENTS) ×1 IMPLANT
TUBE FEEDING 8FR 16IN STR KANG (MISCELLANEOUS) ×1 IMPLANT
TUBING CONNECTING 10 (TUBING) ×2 IMPLANT
TUBING UROLOGY SET (TUBING) ×2 IMPLANT

## 2019-02-26 NOTE — Anesthesia Postprocedure Evaluation (Signed)
Anesthesia Post Note  Patient: Kristen Castillo  Procedure(s) Performed: CYSTOSCOPY/URETEROSCOPY/HOLMIUM LASER/STENT PLACEMENT FIRST STAGE (Left )     Patient location during evaluation: PACU Anesthesia Type: General Level of consciousness: awake and alert Pain management: pain level controlled Vital Signs Assessment: post-procedure vital signs reviewed and stable Respiratory status: spontaneous breathing, nonlabored ventilation, respiratory function stable and patient connected to nasal cannula oxygen Cardiovascular status: blood pressure returned to baseline and stable Postop Assessment: no apparent nausea or vomiting Anesthetic complications: no    Last Vitals:  Vitals:   02/26/19 1030 02/26/19 1045  BP: 134/64 140/66  Pulse: 67 71  Resp: 19 16  Temp:    SpO2: 97% (!) 85%    Last Pain:  Vitals:   02/26/19 1045  TempSrc:   PainSc: 0-No pain                 Ryann Pauli DAVID

## 2019-02-26 NOTE — Discharge Instructions (Signed)
1 - You may have urinary urgency (bladder spasms) and bloody urine on / off with stent in place. This is normal. ° °2 - Call MD or go to ER for fever >102, severe pain / nausea / vomiting not relieved by medications, or acute change in medical status ° °

## 2019-02-26 NOTE — Brief Op Note (Signed)
02/26/2019  9:54 AM  PATIENT:  [name]  78 y.o. female  PRE-OPERATIVE DIAGNOSIS:  LEFT URETERAL STONE  POST-OPERATIVE DIAGNOSIS:  LEFT URETERAL STONE  PROCEDURE:  Procedure(s) with comments: CYSTOSCOPY/URETEROSCOPY/HOLMIUM LASER/STENT PLACEMENT FIRST STAGE (Left) - 1 HR  SURGEON:  Surgeon(s) and Role:    * Alexis Frock, MD - Primary  PHYSICIAN ASSISTANT:   ASSISTANTS: none   ANESTHESIA:   general  EBL:  10 mL   BLOOD ADMINISTERED:none  DRAINS: none   LOCAL MEDICATIONS USED:  NONE  SPECIMEN:  Source of Specimen:  left ureteral stone fragments  DISPOSITION OF SPECIMEN:  [facility] for compositional analysis  COUNTS:  YES  TOURNIQUET:  * No tourniquets in log *  DICTATION: .Other Dictation: Dictation Number [id number]  PLAN OF CARE: Discharge to home after PACU  PATIENT DISPOSITION:  PACU - hemodynamically stable.   Delay start of Pharmacological VTE agent (>24hrs) due to surgical blood loss or risk of bleeding: yes

## 2019-02-26 NOTE — H&P (Signed)
Kristen Castillo is an 78 y.o. female.    Chief Complaint: Pre-Op LEFT Ureteroscopic Stone Manipulation  HPI:  1 - Urolithiasis -  02/2019 - 14mm left proximal ureteral stone at L3 level by ER CT. Several parenchymal non-obstruting stones as well. UA no infectious parameters. Cr 1.37. SSD 12cm. NOT easily seen on KUB due to bowel gas and many mesh tacks.    PMH sig for abdominal hernia repair with mesh (mech tacks on aterior abd wall on imaging x several), colon resection (diverticulitis) / colsotomy / reversal, TAH for molar preg. Her PCP is Kristen Kehr MD.   Today " Kristen Castillo" is seen to proceed with LEFT ureteroscopic stone manipulation for proximal stone. No interval fevers. No interval stone passage. C19 screen negative.     Past Medical History:  Diagnosis Date  . Anxiety   . Cancer ([place])    hx skin cancer  . Complication of anesthesia   . Congestion of both ears   . Deviated nasal septum   . Diverticulitis   . DVT (deep venous thrombosis) ([place])   . GERD (gastroesophageal reflux disease)   . Hair loss   . Hematuria    in past  . History of creation of ostomy [facility])    past partial colectomy in 05/03/14  . History of kidney stones   . History of skin cancer   . Kidney cysts   . Migraines    in past  . Molar pregnancy   . Molar pregnancy   . Osteoarthritis    fingers   . Osteoporosis   . Osteoporosis   . Persistent cough   . Pneumonia 2019  . PONV (postoperative nausea and vomiting)   . Unspecified vitamin D deficiency   . Ventral hernia     Past Surgical History:  Procedure Laterality Date  . ABDOMINAL HYSTERECTOMY  1972  . FACIAL COSMETIC SURGERY  2009   Kristen Fray MD in [place]  . ILEO LOOP COLOSTOMY CLOSURE N/A 11/08/2014   Procedure: LAPAROSCOPIC COLOSTOMY CLOSURE WITH PARTIAL COLECTOMY;  Surgeon: Kristen Confer, MD;  Location: [facility];  Service: General;  Laterality: N/A;  . INSERTION OF MESH N/A 08/01/2015   Procedure: INSERTION OF MESH;  Surgeon: Kristen Confer, MD;  Location: [facility];  Service: General;  Laterality: N/A;  . LAPAROSCOPIC LYSIS OF ADHESIONS N/A 11/08/2014   Procedure: LAPAROSCOPIC LYSIS OF ADHESIONS FOR 90 MINUTES;  Surgeon: Kristen Confer, MD;  Location: [facility];  Service: General;  Laterality: N/A;  . LAPAROSCOPIC LYSIS OF ADHESIONS N/A 08/01/2015   Procedure: LAPAROSCOPIC LYSIS OF ADHESIONS;  Surgeon: Kristen Confer, MD;  Location: [facility];  Service: General;  Laterality: N/A;  . LAPAROSCOPIC PARTIAL COLECTOMY N/A 05/03/2014   Procedure: DIAGNOSTIC LAPAROSCOPY WITH DRAINAGE OF INTRA ABDOMINAL ABSCESS PARTIAL COLECTOMY ;  Surgeon: Kristen Confer, MD;  Location: [facility];  Service: General;  Laterality: N/A;  supine position  . LAPAROTOMY N/A 05/03/2014   Procedure: EXPLORATORY LAPAROTOMY WITH HARTMAN PROCEDURE;  Surgeon: Kristen Confer, MD;  Location: [facility];  Service: General;  Laterality: N/A;  . LIPOMA RESECTION     left scapula area  . OOPHORECTOMY  1976  . REDUCTION MAMMAPLASTY  1994  . RHINOPLASTY  1985  . TONSILLECTOMY    . VENTRAL HERNIA REPAIR N/A 08/01/2015   Procedure: LAPAROSCOPIC VENTRAL INCISIONAL  HERNIA;  Surgeon: Kristen Confer, MD;  Location: [facility];  Service: General;  Laterality: N/A;    Family History  Problem Relation Age of Onset  .  Arthritis Mother   . Colon cancer Mother 64  . Heart disease Father   . Dementia Father   . Diabetes Sister   . Cancer Maternal Grandfather        Lung cancer  . Heart disease Maternal Grandmother   . Stomach cancer Neg Hx    Social History:  reports that she has never smoked. She has never used smokeless tobacco. She reports that she does not drink alcohol or use drugs.  Allergies:  Allergies  Allergen Reactions  . Codeine Nausea And Vomiting  . Dilaudid [Hydromorphone Hcl] Other (See Comments)    "too strong - I can't wake up"  . Sulfonamide Derivatives Other (See Comments)    Childhood allergy; reaction unknown  . Tape Other (See Comments)    Causes  redness and blisters Able to tolerate paper tape  . Norco [Hydrocodone-Acetaminophen] Rash    No medications prior to admission.    Results for orders placed or performed during the hospital encounter of 02/25/19 (from the past 48 hour(s))  SARS CORONAVIRUS 2 (TAT 6-24 HRS) Nasopharyngeal Nasopharyngeal Swab     Status: None   Collection Time: 02/25/19  8:36 AM   Specimen: Nasopharyngeal Swab  Result Value Ref Range   SARS Coronavirus 2 NEGATIVE NEGATIVE    Comment: (NOTE) SARS-CoV-2 target nucleic acids are NOT DETECTED. The SARS-CoV-2 RNA is generally detectable in upper and lower respiratory specimens during the acute phase of infection. Negative results do not preclude SARS-CoV-2 infection, do not rule out co-infections with other pathogens, and should not be used as the sole basis for treatment or other patient management decisions. Negative results must be combined with clinical observations, patient history, and epidemiological information. The expected result is Negative. Fact Sheet for Patients: SugarRoll.be Fact Sheet for Healthcare Providers: https://www.woods-mathews.com/ This test is not yet approved or cleared by the [place] FDA and  has been authorized for detection and/or diagnosis of SARS-CoV-2 by FDA under an Emergency Use Authorization (EUA). This EUA will remain  in effect (meaning this test can be used) for the duration of the COVID-19 declaration under Section 56 4(b)(1) of the Act, 21 U.S.C. section 360bbb-3(b)(1), unless the authorization is terminated or revoked sooner. Performed at [facility], [place] [address] [place], [place] [address]    No results found.  Review of Systems  Constitutional: Negative for chills and fever.  Genitourinary: Positive for flank pain.  All other systems reviewed and are negative.   There were no vitals taken for this visit. Physical Exam  Constitutional: She  appears well-developed.  HENT:  Head: Normocephalic.  Eyes: Pupils are equal, round, and reactive to light.  Cardiovascular: Normal rate.  Respiratory: Effort normal.  GI:  Prior scars w/o hernias.   Genitourinary:    Genitourinary Comments: Mild left CVAT at present.    Musculoskeletal:        General: Normal range of motion.     Cervical back: Normal range of motion.  Neurological: She is alert.  Skin: Skin is warm.  Psychiatric: She has a normal mood and affect.     Assessment/Plan  Proceed as planned with LEFT ureteroscopic stone manipulation. Risks, benefits, alternatives, expected peri-op course discussed previously and reiterated today.   Alexis Frock, MD 02/26/2019, 5:26 AM

## 2019-02-26 NOTE — Anesthesia Preprocedure Evaluation (Addendum)
Anesthesia Evaluation  Patient identified by MRN, date of birth, ID band Patient awake    Reviewed: Allergy & Precautions, NPO status , Patient's Chart, lab work & pertinent test results  History of Anesthesia Complications (+) PONV  Airway Mallampati: I  TM Distance: >3 FB Neck ROM: Full    Dental   Pulmonary    Pulmonary exam normal        Cardiovascular Normal cardiovascular exam     Neuro/Psych Anxiety    GI/Hepatic GERD  Medicated and Controlled,  Endo/Other    Renal/GU      Musculoskeletal   Abdominal   Peds  Hematology   Anesthesia Other Findings   Reproductive/Obstetrics                             Anesthesia Physical Anesthesia Plan  ASA: II  Anesthesia Plan: General   Post-op Pain Management:    Induction: Intravenous  PONV Risk Score and Plan: Ondansetron, Midazolam and Treatment may vary due to age or medical condition  Airway Management Planned: LMA  Additional Equipment:   Intra-op Plan:   Post-operative Plan: Extubation in OR  Informed Consent: I have reviewed the patients History and Physical, chart, labs and discussed the procedure including the risks, benefits and alternatives for the proposed anesthesia with the patient or authorized representative who has indicated his/her understanding and acceptance.       Plan Discussed with: CRNA and Surgeon  Anesthesia Plan Comments:        Anesthesia Quick Evaluation

## 2019-02-26 NOTE — Op Note (Signed)
NAMESAMANTHANICOLE, IBAY [facility] MEDICAL RECORD [id number] ACCOUNT [id number] DATE OF BIRTH:Apr 01, 1940 FACILITY: [facility] LOCATION: [facility] PHYSICIAN:Kyree Adriano, MD  OPERATIVE REPORT  DATE OF PROCEDURE:  02/26/2019  PREOPERATIVE DIAGNOSIS:  Left ureteral and renal stones.  PROCEDURE: 1.  Cystoscopy with left retrograde pyelogram, interpretation. 2.  Left ureteroscopy with laser lithotripsy first-stage. 3.  Insertion of left ureteral stent 5 x 24 Polaris, no tether.  ESTIMATED BLOOD LOSS:  Nil.  COMPLICATIONS:  None.  SPECIMENS:  Left ureteral stone fragments for analysis.  FINDINGS: 1.  Relatively narrow caliber left ureter. 2.  Left mid ureteral stone. 3.  Complete resolution of left ureteral stone burden following laser lithotripsy and basket extraction. 4.  Significant volume left intrarenal stone estimated approximately 1 cm. 5.  Narrow caliber of ureter inhibiting successful lithotripsy of renal stone component today; therefore, stenting alone performed there.  INDICATIONS:  The patient is a pleasant 78 year old lady who was found on workup for colicky flank pain to have a left proximal ureteral stone as well as questionable left intrarenal stones.  Her left renal anatomy is somewhat distorted given numerous  cysts in the area.  Options were discussed for management including medical therapy alone versus lithotripsy versus ureteroscopy, and she wished to proceed with the latter with goal of stone free.  Informed consent was obtained and placed in medical  record.  DESCRIPTION OF PROCEDURE:  The patient being the patient, procedure left ureteroscopic stimulation was confirmed.  Procedure timeout was performed.  Intravenous access administered, general LMA anesthesia induced.  The patient was placed into a low  lithotomy position, sterile field was created by prepping the patient's vagina, introitus and proximal thighs using iodine.  Cystourethroscopy was performed using  21-French rigid cystoscope offset lens.  Inspection of bladder revealed no diverticula,  calcifications, papillary lesions.  The left ureteral orifice was cannulated with 6 [place] renal catheter and leverage volume was obtained.  Left retrograde pyelogram does a single left ureter single system left kidney.  There was a filling defect in the midureter consistent with known stone.  There was mild hydronephrosis above this.  There were multifocal filling defects in the renal pelvis  consistent with additional renal stone volume, the volume of this was quite impressive.  A ZIPwire was advanced to the level of the upper pole and set aside as a safety wire.  An 8-French feeding tube was placed in the urinary bladder for pressure  release, and semirigid ureteroscopy was performed of the distal left ureter alongside a separate sensor working wire.  As expected, there was a left ureteral stone approximately in midureter just above the iliac crossing.  This appeared to be too large  for simple basketing.  Holmium laser energy was then applied to stone using settings of 0.2 joules and 20 Hz until it fragmented into approximately 3 smaller pieces, which were then amenable to simple basketing.  They were removed and set aside for  analysis.  As the goal today was to try to render stone free, semirigid ureteroscopy was performed of the more proximal left ureter.  The ureter was more narrow in this area but without focal stricturing and the left renal pelvis was able to be  identified.  There was significant volume intrarenal stone approximately 1 cm total, some renal pelvis stone in lower pole dominant.  The ureteral caliber ureter would certainly not accommodate an access sheath.  Therefore, precluding significant  lithotripsy of this renal stone burden.  As such, it was felt the most  prudent maneuver would be to leave a stent in today.  Discussed with the patient her goals, whether she wishes to proceed with 2nd  -stage procedure to address her renal stone burden  versus not.  The ureteroscope was then removed.  No significant mucosal abnormalities were found, and a new 5 x 24 Polaris-type stent was placed with remaining safety wire using fluoroscopic guidance.  Good proximal and distal plane were noted.   Anesthesia terminated.  The patient tolerated the procedure well.  No immediate perineal lesions.  The patient taken to postanesthesia care in stable condition.  CN/NUANCE  D:02/26/2019 T:02/26/2019 JOB:[id number]

## 2019-02-26 NOTE — Anesthesia Procedure Notes (Signed)
Procedure Name: LMA Insertion Date/Time: 02/26/2019 9:15 AM Performed by: Glory Buff, CRNA Pre-anesthesia Checklist: Patient identified, Emergency Drugs available, Suction available and Patient being monitored Patient Re-evaluated:Patient Re-evaluated prior to induction Oxygen Delivery Method: Circle system utilized Preoxygenation: Pre-oxygenation with 100% oxygen Induction Type: IV induction LMA: LMA inserted LMA Size: 4.0 Number of attempts: 1 Placement Confirmation: positive ETCO2 and breath sounds checked- equal and bilateral Tube secured with: Tape (Paper tape used) Dental Injury: Teeth and Oropharynx as per pre-operative assessment

## 2019-02-26 NOTE — Transfer of Care (Signed)
Immediate Anesthesia Transfer of Care Note  Patient: Kristen Castillo  Procedure(s) Performed: CYSTOSCOPY/URETEROSCOPY/HOLMIUM LASER/STENT PLACEMENT FIRST STAGE (Left )  Patient Location: PACU  Anesthesia Type:General  Level of Consciousness: drowsy, patient cooperative and responds to stimulation  Airway & Oxygen Therapy: Patient Spontanous Breathing and Patient connected to face mask oxygen  Post-op Assessment: Report given to RN and Post -op Vital signs reviewed and stable  Post vital signs: Reviewed and stable  Last Vitals:  Vitals Value Taken Time  BP 132/70 02/26/19 1002  Temp 37.1 C 02/26/19 1002  Pulse 75 02/26/19 1003  Resp 14 02/26/19 1003  SpO2 97 % 02/26/19 1003  Vitals shown include unvalidated device data.  Last Pain:  Vitals:   02/26/19 0722  TempSrc: Oral  PainSc: 0-No pain         Complications: No apparent anesthesia complications

## 2019-02-26 NOTE — OR Nursing (Signed)
STONE TAKEN BY DR. MANNY

## 2019-03-16 ENCOUNTER — Other Ambulatory Visit: Payer: Self-pay | Admitting: Internal Medicine

## 2019-03-16 DIAGNOSIS — R059 Cough, unspecified: Secondary | ICD-10-CM

## 2019-03-16 DIAGNOSIS — N202 Calculus of kidney with calculus of ureter: Secondary | ICD-10-CM | POA: Diagnosis not present

## 2019-03-16 DIAGNOSIS — C44311 Basal cell carcinoma of skin of nose: Secondary | ICD-10-CM | POA: Diagnosis not present

## 2019-03-16 DIAGNOSIS — N281 Cyst of kidney, acquired: Secondary | ICD-10-CM | POA: Diagnosis not present

## 2019-03-16 DIAGNOSIS — R05 Cough: Secondary | ICD-10-CM

## 2019-03-16 DIAGNOSIS — Z7189 Other specified counseling: Secondary | ICD-10-CM

## 2019-03-17 DIAGNOSIS — Z85828 Personal history of other malignant neoplasm of skin: Secondary | ICD-10-CM | POA: Diagnosis not present

## 2019-03-17 DIAGNOSIS — H2012 Chronic iridocyclitis, left eye: Secondary | ICD-10-CM | POA: Diagnosis not present

## 2019-03-17 DIAGNOSIS — D485 Neoplasm of uncertain behavior of skin: Secondary | ICD-10-CM | POA: Diagnosis not present

## 2019-03-17 DIAGNOSIS — H35373 Puckering of macula, bilateral: Secondary | ICD-10-CM | POA: Diagnosis not present

## 2019-03-17 DIAGNOSIS — L570 Actinic keratosis: Secondary | ICD-10-CM | POA: Diagnosis not present

## 2019-03-17 DIAGNOSIS — Z961 Presence of intraocular lens: Secondary | ICD-10-CM | POA: Diagnosis not present

## 2019-03-17 DIAGNOSIS — L03011 Cellulitis of right finger: Secondary | ICD-10-CM | POA: Diagnosis not present

## 2019-03-22 DIAGNOSIS — H903 Sensorineural hearing loss, bilateral: Secondary | ICD-10-CM | POA: Diagnosis not present

## 2019-03-30 DIAGNOSIS — H2012 Chronic iridocyclitis, left eye: Secondary | ICD-10-CM | POA: Diagnosis not present

## 2019-04-02 DIAGNOSIS — H2012 Chronic iridocyclitis, left eye: Secondary | ICD-10-CM | POA: Diagnosis not present

## 2019-04-09 ENCOUNTER — Other Ambulatory Visit: Payer: Self-pay

## 2019-04-09 DIAGNOSIS — H2012 Chronic iridocyclitis, left eye: Secondary | ICD-10-CM | POA: Diagnosis not present

## 2019-04-12 ENCOUNTER — Other Ambulatory Visit: Payer: Self-pay | Admitting: Internal Medicine

## 2019-04-12 ENCOUNTER — Other Ambulatory Visit: Payer: Medicare Other

## 2019-04-12 ENCOUNTER — Ambulatory Visit: Payer: Medicare Other | Admitting: Obstetrics and Gynecology

## 2019-04-12 DIAGNOSIS — R05 Cough: Secondary | ICD-10-CM

## 2019-04-12 DIAGNOSIS — R059 Cough, unspecified: Secondary | ICD-10-CM

## 2019-04-12 DIAGNOSIS — R311 Benign essential microscopic hematuria: Secondary | ICD-10-CM | POA: Diagnosis not present

## 2019-04-12 DIAGNOSIS — Z7189 Other specified counseling: Secondary | ICD-10-CM

## 2019-04-12 DIAGNOSIS — N132 Hydronephrosis with renal and ureteral calculous obstruction: Secondary | ICD-10-CM | POA: Diagnosis not present

## 2019-04-12 DIAGNOSIS — R1084 Generalized abdominal pain: Secondary | ICD-10-CM | POA: Diagnosis not present

## 2019-04-12 DIAGNOSIS — N200 Calculus of kidney: Secondary | ICD-10-CM | POA: Diagnosis not present

## 2019-04-12 NOTE — Telephone Encounter (Signed)
Would you like pt to continue medication?

## 2019-04-20 ENCOUNTER — Other Ambulatory Visit: Payer: Self-pay | Admitting: Nurse Practitioner

## 2019-04-20 ENCOUNTER — Other Ambulatory Visit (HOSPITAL_COMMUNITY): Payer: Self-pay | Admitting: Nurse Practitioner

## 2019-04-20 ENCOUNTER — Other Ambulatory Visit: Payer: Self-pay

## 2019-04-20 DIAGNOSIS — N202 Calculus of kidney with calculus of ureter: Secondary | ICD-10-CM

## 2019-04-20 DIAGNOSIS — N132 Hydronephrosis with renal and ureteral calculous obstruction: Secondary | ICD-10-CM | POA: Diagnosis not present

## 2019-04-20 DIAGNOSIS — R311 Benign essential microscopic hematuria: Secondary | ICD-10-CM | POA: Diagnosis not present

## 2019-04-21 ENCOUNTER — Ambulatory Visit (HOSPITAL_COMMUNITY)
Admission: RE | Admit: 2019-04-21 | Discharge: 2019-04-21 | Disposition: A | Discharge status: Home / Self Care | Payer: Medicare Other | Source: Ambulatory Visit | Attending: Nurse Practitioner | Admitting: Nurse Practitioner

## 2019-04-21 ENCOUNTER — Ambulatory Visit: Payer: Medicare Other | Admitting: Obstetrics and Gynecology

## 2019-04-21 ENCOUNTER — Other Ambulatory Visit: Payer: Medicare Other

## 2019-04-21 DIAGNOSIS — K5730 Diverticulosis of large intestine without perforation or abscess without bleeding: Secondary | ICD-10-CM | POA: Diagnosis not present

## 2019-04-21 DIAGNOSIS — N132 Hydronephrosis with renal and ureteral calculous obstruction: Secondary | ICD-10-CM

## 2019-04-21 DIAGNOSIS — N202 Calculus of kidney with calculus of ureter: Secondary | ICD-10-CM | POA: Diagnosis not present

## 2019-04-21 DIAGNOSIS — K8020 Calculus of gallbladder without cholecystitis without obstruction: Secondary | ICD-10-CM | POA: Diagnosis not present

## 2019-04-23 ENCOUNTER — Other Ambulatory Visit: Payer: Self-pay | Admitting: Urology

## 2019-04-30 ENCOUNTER — Ambulatory Visit: Payer: Medicare Other | Admitting: Internal Medicine

## 2019-05-05 DIAGNOSIS — H2012 Chronic iridocyclitis, left eye: Secondary | ICD-10-CM | POA: Diagnosis not present

## 2019-05-06 ENCOUNTER — Ambulatory Visit: Payer: Medicare Other | Admitting: Internal Medicine

## 2019-05-07 ENCOUNTER — Ambulatory Visit (INDEPENDENT_AMBULATORY_CARE_PROVIDER_SITE_OTHER): Payer: Medicare Other | Admitting: Psychology

## 2019-05-07 DIAGNOSIS — F4320 Adjustment disorder, unspecified: Secondary | ICD-10-CM

## 2019-05-11 ENCOUNTER — Other Ambulatory Visit: Payer: Self-pay

## 2019-05-11 ENCOUNTER — Encounter: Payer: Self-pay | Admitting: Internal Medicine

## 2019-05-11 ENCOUNTER — Ambulatory Visit: Payer: Medicare Other | Admitting: Internal Medicine

## 2019-05-11 VITALS — BP 132/78 | HR 67 | Temp 98.0°F | Ht 62.0 in | Wt 124.0 lb

## 2019-05-11 DIAGNOSIS — Z01818 Encounter for other preprocedural examination: Secondary | ICD-10-CM

## 2019-05-11 DIAGNOSIS — N200 Calculus of kidney: Secondary | ICD-10-CM | POA: Diagnosis not present

## 2019-05-11 DIAGNOSIS — R002 Palpitations: Secondary | ICD-10-CM

## 2019-05-11 DIAGNOSIS — I2692 Saddle embolus of pulmonary artery without acute cor pulmonale: Secondary | ICD-10-CM | POA: Diagnosis not present

## 2019-05-11 DIAGNOSIS — E559 Vitamin D deficiency, unspecified: Secondary | ICD-10-CM

## 2019-05-11 LAB — HEPATIC FUNCTION PANEL
ALT: 7 U/L (ref 0–35)
AST: 15 U/L (ref 0–37)
Albumin: 4.1 g/dL (ref 3.5–5.2)
Alkaline Phosphatase: 77 U/L (ref 39–117)
Bilirubin, Direct: 0.1 mg/dL (ref 0.0–0.3)
Total Bilirubin: 0.7 mg/dL (ref 0.2–1.2)
Total Protein: 8.2 g/dL (ref 6.0–8.3)

## 2019-05-11 LAB — CBC WITH DIFFERENTIAL/PLATELET
Basophils Absolute: 0.1 10*3/uL (ref 0.0–0.1)
Basophils Relative: 0.7 % (ref 0.0–3.0)
Eosinophils Absolute: 0.1 10*3/uL (ref 0.0–0.7)
Eosinophils Relative: 1.3 % (ref 0.0–5.0)
HCT: 41.1 % (ref 36.0–46.0)
Hemoglobin: 13.2 g/dL (ref 12.0–15.0)
Lymphocytes Relative: 18 % (ref 12.0–46.0)
Lymphs Abs: 1.5 10*3/uL (ref 0.7–4.0)
MCHC: 32.2 g/dL (ref 30.0–36.0)
MCV: 89.2 fl (ref 78.0–100.0)
Monocytes Absolute: 0.4 10*3/uL (ref 0.1–1.0)
Monocytes Relative: 4.6 % (ref 3.0–12.0)
Neutro Abs: 6.1 10*3/uL (ref 1.4–7.7)
Neutrophils Relative %: 75.4 % (ref 43.0–77.0)
Platelets: 339 10*3/uL (ref 150.0–400.0)
RBC: 4.6 Mil/uL (ref 3.87–5.11)
RDW: 14 % (ref 11.5–15.5)
WBC: 8.1 10*3/uL (ref 4.0–10.5)

## 2019-05-11 LAB — PROTIME-INR
INR: 1.3 ratio — ABNORMAL HIGH (ref 0.8–1.0)
Prothrombin Time: 14.6 s — ABNORMAL HIGH (ref 9.6–13.1)

## 2019-05-11 LAB — BASIC METABOLIC PANEL
BUN: 32 mg/dL — ABNORMAL HIGH (ref 6–23)
CO2: 25 mEq/L (ref 19–32)
Calcium: 10.2 mg/dL (ref 8.4–10.5)
Chloride: 106 mEq/L (ref 96–112)
Creatinine, Ser: 1.15 mg/dL (ref 0.40–1.20)
GFR: 45.59 mL/min — ABNORMAL LOW (ref >60.00)
Glucose, Bld: 105 mg/dL — ABNORMAL HIGH (ref 70–99)
Potassium: 4 mEq/L (ref 3.5–5.1)
Sodium: 141 mEq/L (ref 135–145)

## 2019-05-11 LAB — TSH: TSH: 2.34 u[IU]/mL (ref 0.35–4.50)

## 2019-05-11 LAB — HCG, QUANTITATIVE, PREGNANCY: Quantitative HCG: 7.09 m[IU]/mL

## 2019-05-11 NOTE — Assessment & Plan Note (Addendum)
The pt is medically clear for her upcoming urologic procedure. Labs ordered.  You can continue for hold Eliquis for the procedure at your discretion.  Thanks,

## 2019-05-11 NOTE — Progress Notes (Signed)
Subjective:  Patient ID: Kristen Castillo, female    DOB: 07-Nov-1940  Age: 79 y.o. MRN: [id number]  CC: No chief complaint on file.   HPI  IM consult Pre-op clearance Req by Dr Tresa Moore Reason - cystoscopy + stenting Hx:  Kristen Castillo presents for a recent kidney stone F/u osteoporosis, anxiety, anticoagulation for PE - all stable F/u on rising HCG.  Past med records reviewed  Outpatient Medications Prior to Visit  Medication Sig Dispense Refill  . benzonatate (TESSALON) 100 MG capsule TAKE  (1)  CAPSULE THREE TIMES DAILY AS NEEDED. (Patient taking differently: Take 100 mg by mouth at bedtime. ) 40 capsule 0  . Bromfenac Sodium (PROLENSA) 0.07 % SOLN Place 1 drop into the left eye daily.    . budesonide (RHINOCORT ALLERGY) 32 MCG/ACT nasal spray Place 1 spray into both nostrils in the morning and at bedtime.     . calcium carbonate (OS-CAL) 600 MG TABS tablet Take 600 mg by mouth daily.     . Cholecalciferol (VITAMIN D3) 1.25 MG (50000 UT) CAPS Take 1 capsule by mouth every 30 (thirty) days. 3 capsule 3  . denosumab (PROLIA) 60 MG/ML SOLN injection Inject 60 mg into the skin every 6 (six) months. Administer in upper arm, thigh, or abdomen    . ELIQUIS 2.5 MG TABS tablet TAKE 1 TABLET BY MOUTH TWICE DAILY. (Patient taking differently: Take 2.5 mg by mouth 2 (two) times daily. ) 60 tablet 5  . famotidine (PEPCID) 20 MG tablet Take 20 mg by mouth at bedtime.     Marland Kitchen ketorolac (TORADOL) 10 MG tablet Take 1 tablet (10 mg total) by mouth every 8 (eight) hours as needed for moderate pain. Or stent discomfort post-operatively. 20 tablet 0  . LORazepam (ATIVAN) 0.5 MG tablet Take 1 tablet (0.5 mg total) by mouth 2 (two) times daily as needed for anxiety. (Patient taking differently: Take 0.5 mg by mouth at bedtime. ) 60 tablet 5  . Multiple Vitamin (MULTIVITAMIN) tablet Take 1 tablet by mouth at bedtime.     Vladimir Faster Glycol-Propyl Glycol (SYSTANE OP) Place 1 drop into both eyes 3  (three) times daily as needed (dry eyes).    Marland Kitchen azithromycin (ZITHROMAX) 250 MG tablet 2 tabs po qd x 1 day; 1 tablet per day x 4 days; (Patient not taking: Reported on 02/22/2019) 6 tablet 0  . ondansetron (ZOFRAN ODT) 4 MG disintegrating tablet Take 1 tablet (4 mg total) by mouth every 8 (eight) hours as needed for nausea or vomiting. (Patient not taking: Reported on 05/07/2019) 20 tablet 0  . ondansetron (ZOFRAN-ODT) 8 MG disintegrating tablet Take 1 tablet (8 mg total) by mouth every 8 (eight) hours as needed for nausea or vomiting. (Patient not taking: Reported on 05/07/2019) 20 tablet 0  . ondansetron (ZOFRAN-ODT) 8 MG disintegrating tablet Take 1 tablet (8 mg total) by mouth every 8 (eight) hours as needed for nausea or vomiting. (Patient not taking: Reported on 05/07/2019) 20 tablet 0  . ondansetron (ZOFRAN-ODT) 8 MG disintegrating tablet Take 1 tablet (8 mg total) by mouth every 8 (eight) hours as needed for nausea or vomiting. (Patient not taking: Reported on 05/07/2019) 20 tablet 0  . oxyCODONE-acetaminophen (PERCOCET/ROXICET) 5-325 MG tablet Take 1 tablet by mouth every 4 (four) hours as needed for severe pain. (Patient not taking: Reported on 05/07/2019) 10 tablet 0  . pantoprazole (PROTONIX) 40 MG tablet Take 1 tablet (40 mg total) by mouth daily. (Patient not taking: Reported  on 02/22/2019) 30 tablet 1  . saccharomyces boulardii (FLORASTOR) 250 MG capsule Take 1 capsule (250 mg total) by mouth 2 (two) times daily. (Patient not taking: Reported on 02/22/2019) 60 capsule 1  . triamcinolone ointment (KENALOG) 0.1 % Apply 1 application topically 2 (two) times daily. (Patient not taking: Reported on 02/22/2019) 60 g 2   No facility-administered medications prior to visit.   Past Medical History:  Diagnosis Date  . Anxiety   . Cancer ([place])    hx skin cancer  . Complication of anesthesia   . Congestion of both ears   . Deviated nasal septum   . Diverticulitis   . DVT (deep venous thrombosis)  ([place])   . GERD (gastroesophageal reflux disease)   . Hair loss   . Hematuria    in past  . History of creation of ostomy [facility])    past partial colectomy in 05/03/14  . History of kidney stones   . History of skin cancer   . Kidney cysts   . Migraines    in past  . Molar pregnancy   . Molar pregnancy   . Osteoarthritis    fingers   . Osteoporosis   . Osteoporosis   . Persistent cough   . Pneumonia 2019  . PONV (postoperative nausea and vomiting)   . Unspecified vitamin D deficiency   . Ventral hernia    Past Surgical History:  Procedure Laterality Date  . ABDOMINAL HYSTERECTOMY  1972  . CYSTOSCOPY/URETEROSCOPY/HOLMIUM LASER/STENT PLACEMENT Left 02/26/2019   Procedure: CYSTOSCOPY/URETEROSCOPY/HOLMIUM LASER/STENT PLACEMENT FIRST STAGE;  Surgeon: Alexis Frock, MD;  Location: [facility];  Service: Urology;  Laterality: Left;  1 HR  . FACIAL COSMETIC SURGERY  2009   [name] MD in [place]  . ILEO LOOP COLOSTOMY CLOSURE N/A 11/08/2014   Procedure: LAPAROSCOPIC COLOSTOMY CLOSURE WITH PARTIAL COLECTOMY;  Surgeon: [name], MD;  Location: [facility];  Service: General;  Laterality: N/A;  . INSERTION OF MESH N/A 08/01/2015   Procedure: INSERTION OF MESH;  Surgeon: [name], MD;  Location: [facility];  Service: General;  Laterality: N/A;  . LAPAROSCOPIC LYSIS OF ADHESIONS N/A 11/08/2014   Procedure: LAPAROSCOPIC LYSIS OF ADHESIONS FOR 90 MINUTES;  Surgeon: [name], MD;  Location: [facility];  Service: General;  Laterality: N/A;  . LAPAROSCOPIC LYSIS OF ADHESIONS N/A 08/01/2015   Procedure: LAPAROSCOPIC LYSIS OF ADHESIONS;  Surgeon: [name], MD;  Location: [facility];  Service: General;  Laterality: N/A;  . LAPAROSCOPIC PARTIAL COLECTOMY N/A 05/03/2014   Procedure: DIAGNOSTIC LAPAROSCOPY WITH DRAINAGE OF INTRA ABDOMINAL ABSCESS PARTIAL COLECTOMY ;  Surgeon: [name], MD;  Location: [facility];  Service: General;  Laterality: N/A;  supine position  . LAPAROTOMY N/A 05/03/2014    Procedure: EXPLORATORY LAPAROTOMY WITH HARTMAN PROCEDURE;  Surgeon: [name], MD;  Location: [facility];  Service: General;  Laterality: N/A;  . LIPOMA RESECTION     left scapula area  . OOPHORECTOMY  1976  . REDUCTION MAMMAPLASTY  1994  . RHINOPLASTY  1985  . TONSILLECTOMY    . VENTRAL HERNIA REPAIR N/A 08/01/2015   Procedure: LAPAROSCOPIC VENTRAL INCISIONAL  HERNIA;  Surgeon: [name], MD;  Location: [facility];  Service: General;  Laterality: N/A;    reports that she has never smoked. She has never used smokeless tobacco. She reports that she does not drink alcohol or use drugs. family history includes Arthritis in her mother; Cancer in her maternal grandfather; Colon cancer (age of onset: 24) in her mother;  Dementia in her father; Diabetes in her sister; Heart disease in her father and maternal grandmother. Allergies  Allergen Reactions  . Codeine Nausea And Vomiting  . Dilaudid [Hydromorphone Hcl] Other (See Comments)    "too strong - I can't wake up"  . Sulfonamide Derivatives Other (See Comments)    Childhood allergy; reaction unknown  . Tape Other (See Comments)    Causes redness and blisters Able to tolerate paper tape  . Norco [Hydrocodone-Acetaminophen] Rash    ROS: Review of Systems  Constitutional: Positive for unexpected weight change. Negative for activity change, appetite change, chills, fatigue and fever.  HENT: Negative for congestion, mouth sores and sinus pressure.   Eyes: Negative for visual disturbance.  Respiratory: Negative for cough and chest tightness.   Gastrointestinal: Negative for abdominal pain and nausea.  Genitourinary: Negative for difficulty urinating, frequency and vaginal pain.  Musculoskeletal: Negative for back pain and gait problem.  Skin: Negative for pallor and rash.  Neurological: Negative for dizziness, tremors, weakness, numbness and headaches.  Psychiatric/Behavioral: Negative for confusion, sleep disturbance and suicidal ideas.      Objective:  BP 132/78 (BP Location: Left Arm, Patient Position: Sitting, Cuff Size: Normal)   Pulse 67   Temp 98 F (36.7 C) (Oral)   Ht 5\' 2"  (1.575 m)   Wt 124 lb (56.2 kg)   SpO2 98%   BMI 22.68 kg/m   BP Readings from Last 3 Encounters:  05/11/19 132/78  02/26/19 (!) 157/80  02/25/19 (!) 162/71    Wt Readings from Last 3 Encounters:  05/11/19 124 lb (56.2 kg)  02/25/19 137 lb 9.6 oz (62.4 kg)  12/18/18 127 lb 0.6 oz (57.6 kg)    Physical Exam Constitutional:      General: She is not in acute distress.    Appearance: She is well-developed.  HENT:     Head: Normocephalic.     Right Ear: External ear normal.     Left Ear: External ear normal.     Nose: Nose normal.  Eyes:     General:        Right eye: No discharge.        Left eye: No discharge.     Conjunctiva/sclera: Conjunctivae normal.     Pupils: Pupils are equal, round, and reactive to light.  Neck:     Thyroid: No thyromegaly.     Vascular: No JVD.     Trachea: No tracheal deviation.  Cardiovascular:     Rate and Rhythm: Normal rate and regular rhythm.     Heart sounds: Normal heart sounds.  Pulmonary:     Effort: No respiratory distress.     Breath sounds: No stridor. No wheezing.  Abdominal:     General: Bowel sounds are normal. There is no distension.     Palpations: Abdomen is soft. There is no mass.     Tenderness: There is no abdominal tenderness. There is no guarding or rebound.  Musculoskeletal:        General: No tenderness.     Cervical back: Normal range of motion and neck supple.  Lymphadenopathy:     Cervical: No cervical adenopathy.  Skin:    Findings: No erythema or rash.  Neurological:     Cranial Nerves: No cranial nerve deficit.     Motor: No abnormal muscle tone.     Coordination: Coordination normal.     Deep Tendon Reflexes: Reflexes normal.  Psychiatric:        Behavior: Behavior normal.  Thought Content: Thought content normal.        Judgment: Judgment  normal.    >60 min  Lab Results  Component Value Date   WBC 13.0 (H) 02/22/2019   HGB 13.0 02/22/2019   HCT 41.1 02/22/2019   PLT 270 02/22/2019   GLUCOSE 156 (H) 02/22/2019   CHOL 208 (H) 11/03/2018   TRIG 176.0 (H) 11/03/2018   HDL 55.80 11/03/2018   LDLCALC 117 (H) 11/03/2018   ALT 14 02/22/2019   AST 18 02/22/2019   NA 138 02/22/2019   K 4.3 02/22/2019   CL 109 02/22/2019   CREATININE 1.37 (H) 02/22/2019   BUN 47 (H) 02/22/2019   CO2 19 (L) 02/22/2019   TSH 3.28 11/03/2018   INR 1.54 (H) 08/25/2015   HGBA1C 6.0 (H) 11/03/2014    CT ABDOMEN PELVIS WO CONTRAST  Result Date: 04/21/2019 CLINICAL DATA:  Nephrolithiasis and obstructing left ureteral calculus. EXAM: CT ABDOMEN AND PELVIS WITHOUT CONTRAST TECHNIQUE: Multidetector CT imaging of the abdomen and pelvis was performed following the standard protocol without IV contrast. COMPARISON:  02/22/2019 FINDINGS: Lower chest: No acute findings. Hepatobiliary: Small fluid attenuation cyst again seen in the posterior right hepatic lobe. No mass visualized on this unenhanced exam. Tiny calcified gallstones again seen, however there is no evidence of cholecystitis or biliary ductal dilatation. Pancreas: No mass or inflammatory process visualized on this unenhanced exam. Spleen: Within normal limits in size. Stable fluid attenuation cyst as well as calcified granulomata. Adrenals/Urinary tract: Bilateral nephrolithiasis again seen convex mL largest calculus in lower pole of left kidney measuring 10 mm. This is bilateral fluid attenuation renal cysts are stable. Moderate left hydronephrosis is again seen, which is due to a 6 mm proximal left ureteral calculus which is unchanged in position since prior exam. Stomach/Bowel: No evidence of obstruction, inflammatory process, or abnormal fluid collections. Diverticulosis of ascending colon is noted, however there is no evidence of diverticulitis. Vascular/Lymphatic: No pathologically enlarged lymph  nodes identified. No evidence of abdominal aortic aneurysm. Aortic atherosclerosis incidentally noted. Reproductive: Prior hysterectomy noted. Adnexal regions are unremarkable in appearance. Other: Surgical mesh again seen in anterior abdominal wall soft tissues. No evidence of recurrent hernia. Musculoskeletal: No suspicious bone lesions identified. 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 IMPRESSION: 1. Stable moderate left hydronephrosis due to 6 mm proximal left ureteral calculus. 2. Bilateral nephrolithiasis. 3. Cholelithiasis. No radiographic evidence of cholecystitis. 4. Colonic diverticulosis. No radiographic evidence of diverticulitis. Aortic Atherosclerosis (ICD10-I70.0). Electronically Signed   By: Marlaine Hind M.D.   On: 04/21/2019 15:55    Assessment & Plan:   There are no diagnoses linked to this encounter.   No orders of the defined types were placed in this encounter.    Follow-up: No follow-ups on file.  Walker Kehr, MD

## 2019-05-11 NOTE — Assessment & Plan Note (Signed)
Cystoscopy and stent placement pending

## 2019-05-11 NOTE — Assessment & Plan Note (Signed)
Stable.  Rare

## 2019-05-11 NOTE — Assessment & Plan Note (Signed)
Continue with vitamin D 

## 2019-05-11 NOTE — Assessment & Plan Note (Signed)
On Eliquis

## 2019-05-12 ENCOUNTER — Other Ambulatory Visit: Payer: Self-pay | Admitting: Internal Medicine

## 2019-05-12 ENCOUNTER — Other Ambulatory Visit: Payer: Self-pay | Admitting: Emergency Medicine

## 2019-05-12 DIAGNOSIS — R059 Cough, unspecified: Secondary | ICD-10-CM

## 2019-05-12 DIAGNOSIS — Z7189 Other specified counseling: Secondary | ICD-10-CM

## 2019-05-12 DIAGNOSIS — R05 Cough: Secondary | ICD-10-CM

## 2019-05-12 NOTE — Patient Instructions (Signed)
DUE TO COVID-19 ONLY ONE VISITOR IS ALLOWED TO COME WITH YOU AND STAY IN THE WAITING ROOM ONLY DURING PRE OP AND PROCEDURE DAY OF SURGERY. THE 1 VISITOR MAY VISIT WITH YOU AFTER SURGERY IN YOUR PRIVATE ROOM DURING VISITING HOURS ONLY!  YOU NEED TO HAVE A COVID 19 TEST ON_3/9______ @_10 :05______, THIS TEST MUST BE DONE BEFORE SURGERY, COME  [place] , [address].  ([place]) ONCE YOUR COVID TEST IS COMPLETED, PLEASE BEGIN THE QUARANTINE INSTRUCTIONS AS OUTLINED IN YOUR HANDOUT.                Rocky Vogelsong   Your procedure is scheduled on: 05/21/19   Report to [facility] Main  Entrance   Report to admitting at  12:45 PM     Call this number if you have problems the morning of surgery [phone]    Remember: Do not eat food after Midnight.  You may have clear liquids until 8:45    CLEAR LIQUID DIET   Foods Allowed                                                                     Foods Excluded  Coffee and tea, regular and decaf                             liquids that you cannot  Plain Jell-O any favor except red or purple                                           see through such as: Fruit ices (not with fruit pulp)                                     milk, soups, orange juice  Iced Popsicles                                    All solid food Carbonated beverages, regular and diet                                    Cranberry, grape and apple juices Sports drinks like Gatorade Lightly seasoned clear broth or consume(fat free) Sugar, honey syrup    BRUSH YOUR TEETH MORNING OF SURGERY AND RINSE YOUR MOUTH OUT, NO CHEWING GUM CANDY OR MINTS.     Take these medicines the morning of surgery with A SIP OF WATER: none, but you can use your eye drops                                 You may not have any metal on your body including hair pins and              piercings  Do not wear jewelry, make-up,  lotions, powders or  perfumes, deodorant             Do not wear nail polish on your fingernails.  Do not shave  48 hours prior to surgery.             Do not bring valuables to the hospital. [place].  Contacts, dentures or bridgework may not be worn into surgery.      Patients discharged the day of surgery will not be allowed to drive home.   IF YOU ARE HAVING SURGERY AND GOING HOME THE SAME DAY, YOU MUST HAVE AN ADULT TO DRIVE YOU HOME AND BE WITH YOU FOR 24 HOURS. YOU MAY GO HOME BY TAXI OR UBER OR ORTHERWISE, BUT AN ADULT MUST ACCOMPANY YOU HOME AND STAY WITH YOU FOR 24 HOURS.  Name and phone number of your driver:  Special Instructions: N/A              Please read over the following fact sheets you were given: _____________________________________________________________________             [facility] - Preparing for Surgery  Before surgery, you can play an important role.   Because skin is not sterile, your skin needs to be as free of germs as possible.   You can reduce the number of germs on your skin by washing with CHG (chlorahexidine gluconate) soap before surgery.   CHG is an antiseptic cleaner which kills germs and bonds with the skin to continue killing germs even after washing. Please DO NOT use if you have an allergy to CHG or antibacterial soaps .  If your skin becomes reddened/irritated stop using the CHG and inform your nurse when you arrive at Short Stay. Do not shave (including legs and underarms) for at least 48 hours prior to the first CHG shower.  . Please follow these instructions carefully:  1.  Shower with CHG Soap the night before surgery and the  morning of Surgery.  2.  If you choose to wash your hair, wash your hair first as usual with your  normal  shampoo.  3.  After you shampoo, rinse your hair and body thoroughly to remove the  shampoo.                                        4.  Use CHG as you would any other liquid  soap.  You can apply chg directly  to the skin and wash                       Gently with a scrungie or clean washcloth.  5.  Apply the CHG Soap to your body ONLY FROM THE NECK DOWN.   Do not use on face/ open                           Wound or open sores. Avoid contact with eyes, ears mouth and genitals (private parts).                       Wash face,  Genitals (private parts) with your normal soap.             6.  Wash  thoroughly, paying special attention to the area where your surgery  will be performed.  7.  Thoroughly rinse your body with warm water from the neck down.  8.  DO NOT shower/wash with your normal soap after using and rinsing off  the CHG Soap.             9.  Pat yourself dry with a clean towel.            10.  Wear clean pajamas.            11.  Place clean sheets on your bed the night of your first shower and do not  sleep with pets. Day of Surgery : Do not apply any lotions/deodorants the morning of surgery.  Please wear clean clothes to the hospital/surgery center.  FAILURE TO FOLLOW THESE INSTRUCTIONS MAY RESULT IN THE CANCELLATION OF YOUR SURGERY PATIENT SIGNATURE_________________________________  NURSE SIGNATURE__________________________________  ________________________________________________________________________

## 2019-05-13 ENCOUNTER — Encounter (HOSPITAL_COMMUNITY): Admission: RE | Admit: 2019-05-13 | Payer: Medicare Other | Source: Ambulatory Visit

## 2019-05-13 ENCOUNTER — Encounter (HOSPITAL_COMMUNITY): Payer: Self-pay

## 2019-05-13 ENCOUNTER — Other Ambulatory Visit: Payer: Self-pay

## 2019-05-13 ENCOUNTER — Other Ambulatory Visit: Payer: Self-pay | Admitting: Urology

## 2019-05-13 ENCOUNTER — Encounter (HOSPITAL_COMMUNITY)
Admission: RE | Admit: 2019-05-13 | Discharge: 2019-05-13 | Disposition: A | Discharge status: Home / Self Care | Payer: Medicare Other | Source: Ambulatory Visit | Attending: Urology | Admitting: Urology

## 2019-05-13 DIAGNOSIS — Z01812 Encounter for preprocedural laboratory examination: Secondary | ICD-10-CM | POA: Diagnosis not present

## 2019-05-13 DIAGNOSIS — Z86718 Personal history of other venous thrombosis and embolism: Secondary | ICD-10-CM | POA: Diagnosis not present

## 2019-05-13 DIAGNOSIS — Z7901 Long term (current) use of anticoagulants: Secondary | ICD-10-CM | POA: Diagnosis not present

## 2019-05-13 NOTE — Progress Notes (Signed)
PCP - Dr. Alain Marion Cardiologist - none  Chest x-ray - 01/18/19 EKG - 12/18/18 Stress Test - no ECHO - no Cardiac Cath - no  Sleep Study - NA CPAP -   Fasting Blood Sugar - NA Checks Blood Sugar _____ times a day  Blood Thinner Instructions:Eliquis for hx of DVT Aspirin Instructions:Dr. Tresa Moore said not to stop Eliquis Last Dose:  Anesthesia review:   Patient denies shortness of breath, fever, cough and chest pain at PAT appointment yes  Patient verbalized understanding of instructions that were given to them at the PAT appointment. Patient was also instructed that they will need to review over the PAT instructions again at home before surgery. yes

## 2019-05-18 ENCOUNTER — Other Ambulatory Visit (HOSPITAL_COMMUNITY): Payer: Medicare Other

## 2019-05-18 ENCOUNTER — Other Ambulatory Visit (HOSPITAL_COMMUNITY)
Admission: RE | Admit: 2019-05-18 | Discharge: 2019-05-18 | Disposition: A | Discharge status: Home / Self Care | Payer: Medicare Other | Source: Ambulatory Visit | Attending: Urology | Admitting: Urology

## 2019-05-18 DIAGNOSIS — Z01812 Encounter for preprocedural laboratory examination: Secondary | ICD-10-CM | POA: Insufficient documentation

## 2019-05-18 DIAGNOSIS — Z20822 Contact with and (suspected) exposure to covid-19: Secondary | ICD-10-CM | POA: Diagnosis not present

## 2019-05-19 ENCOUNTER — Telehealth: Payer: Self-pay | Admitting: Emergency Medicine

## 2019-05-19 LAB — SARS CORONAVIRUS 2 (TAT 6-24 HRS): SARS Coronavirus 2: NEGATIVE

## 2019-05-19 NOTE — Telephone Encounter (Signed)
Spoke with pt, she would like to know if she should stop her Eliquis before her procedure for her kidney stone. She states Dr. Tresa Moore advised her to continue taking it but wanted to see what Dr. Lamonte Sakai suggested. I advised her that Dr. Lamonte Sakai wasn't here and wasn't sure if he would be back to answer her by Friday..I asked Dr. Erskine Emery in clinic verbally what he would suggest. He suggested she stop the Eliquis two days prior to her procedure. I gave her the recommendations. Pt understood. FYI Dr. Lamonte Sakai.

## 2019-05-19 NOTE — Telephone Encounter (Signed)
Patient having kidney stone surgery on Friday at [facility] at 2:45 by Dr Tresa Moore. Per Dr Tresa Moore, patient can take 2.5 Eliquis. Patient wants to be sure and wants Dr Agustina Caroli opinion. Patient did have same surgery in December 2020.If patient doesn't answer, please leave message with definite yes or definite no. Please advise. CB#[phone]

## 2019-05-20 MED ORDER — GENTAMICIN SULFATE 40 MG/ML IJ SOLN
5.0000 mg/kg | INTRAVENOUS | Status: AC
  Administered 2019-05-21: 280 mg via INTRAVENOUS
  Filled 2019-05-20: qty 7

## 2019-05-20 NOTE — Telephone Encounter (Signed)
ATC pt, no answer. Left message for pt to call back.  

## 2019-05-20 NOTE — Telephone Encounter (Signed)
Called pt and advised message from the provider. Pt understood and verbalized understanding. Nothing further is needed.    

## 2019-05-20 NOTE — Anesthesia Preprocedure Evaluation (Addendum)
Anesthesia Evaluation  Patient identified by MRN, date of birth, ID band Patient awake    Reviewed: Allergy & Precautions, NPO status , Patient's Chart, lab work & pertinent test results  History of Anesthesia Complications (+) PONV and history of anesthetic complications  Airway Mallampati: I  TM Distance: >3 FB Neck ROM: Full    Dental   Pulmonary    Pulmonary exam normal        Cardiovascular + DVT  Normal cardiovascular exam  10/20 EKG  SR R 65 w NSST changes   2017 Echo  LV EF: 55% -   60%     Neuro/Psych  Headaches, Anxiety negative psych ROS   GI/Hepatic GERD  Medicated and Controlled,  Endo/Other    Renal/GU Renal diseaseK+ 4.0 Cr 1.15     Musculoskeletal  (+) Arthritis ,   Abdominal   Peds  Hematology Hgb 13.2   Anesthesia Other Findings   Reproductive/Obstetrics                            Anesthesia Physical Anesthesia Plan  ASA: III  Anesthesia Plan: General   Post-op Pain Management:    Induction: Intravenous  PONV Risk Score and Plan: Treatment may vary due to age or medical condition, Ondansetron and Dexamethasone  Airway Management Planned: LMA  Additional Equipment: None  Intra-op Plan:   Post-operative Plan: Extubation in OR  Informed Consent: I have reviewed the patients History and Physical, chart, labs and discussed the procedure including the risks, benefits and alternatives for the proposed anesthesia with the patient or authorized representative who has indicated his/her understanding and acceptance.     Dental advisory given  Plan Discussed with: CRNA and Surgeon  Anesthesia Plan Comments:        Anesthesia Quick Evaluation

## 2019-05-20 NOTE — Telephone Encounter (Signed)
Pt returned call. Informed her of the recs per Dr. Lamonte Sakai. Pt states she already took a dose this morning. Pts sx is scheduled for 05/21/2019. Advised pt to not take the dose this afternoon or tomorrow and to restart the day after procedure per Dr. Lamonte Sakai. Pt states she will still proceed with procedure and let Dr. Tresa Moore know.   Dr. Lamonte Sakai please advise if this will be ok. Pt will be off Eliquis 24 hours prior to procedure instead of the recommended 48 hours.

## 2019-05-20 NOTE — Telephone Encounter (Signed)
It is okay that she took that dose.  Dr. Tresa Moore actually originally told her that she could stay on the Eliquis.  We're only stopping it for safekeeping.  Ask her to not take any more until the day after the procedure

## 2019-05-20 NOTE — Telephone Encounter (Signed)
She can stop the Eliquis safely 2 days prior, then plan to restart day after procedure

## 2019-05-21 ENCOUNTER — Encounter (HOSPITAL_COMMUNITY): Payer: Self-pay | Admitting: Urology

## 2019-05-21 ENCOUNTER — Ambulatory Visit (HOSPITAL_COMMUNITY)
Admission: RE | Admit: 2019-05-21 | Discharge: 2019-05-21 | Disposition: A | Discharge status: Home / Self Care | Payer: Medicare Other | Attending: Urology | Admitting: Urology

## 2019-05-21 ENCOUNTER — Encounter (HOSPITAL_COMMUNITY)
Admission: RE | Disposition: A | Discharge status: Home / Self Care | Payer: Self-pay | Source: Home / Self Care | Attending: Urology

## 2019-05-21 ENCOUNTER — Ambulatory Visit (HOSPITAL_COMMUNITY): Payer: Medicare Other

## 2019-05-21 ENCOUNTER — Ambulatory Visit (HOSPITAL_COMMUNITY): Payer: Medicare Other | Admitting: Physician Assistant

## 2019-05-21 ENCOUNTER — Ambulatory Visit (HOSPITAL_COMMUNITY): Payer: Medicare Other | Admitting: Anesthesiology

## 2019-05-21 DIAGNOSIS — Z85828 Personal history of other malignant neoplasm of skin: Secondary | ICD-10-CM | POA: Diagnosis not present

## 2019-05-21 DIAGNOSIS — Z9049 Acquired absence of other specified parts of digestive tract: Secondary | ICD-10-CM | POA: Diagnosis not present

## 2019-05-21 DIAGNOSIS — Z888 Allergy status to other drugs, medicaments and biological substances status: Secondary | ICD-10-CM | POA: Diagnosis not present

## 2019-05-21 DIAGNOSIS — Z86718 Personal history of other venous thrombosis and embolism: Secondary | ICD-10-CM | POA: Diagnosis not present

## 2019-05-21 DIAGNOSIS — Z885 Allergy status to narcotic agent status: Secondary | ICD-10-CM | POA: Diagnosis not present

## 2019-05-21 DIAGNOSIS — Z882 Allergy status to sulfonamides status: Secondary | ICD-10-CM | POA: Diagnosis not present

## 2019-05-21 DIAGNOSIS — N132 Hydronephrosis with renal and ureteral calculous obstruction: Secondary | ICD-10-CM | POA: Insufficient documentation

## 2019-05-21 DIAGNOSIS — Q6102 Congenital multiple renal cysts: Secondary | ICD-10-CM | POA: Diagnosis not present

## 2019-05-21 DIAGNOSIS — N201 Calculus of ureter: Secondary | ICD-10-CM | POA: Diagnosis not present

## 2019-05-21 DIAGNOSIS — F419 Anxiety disorder, unspecified: Secondary | ICD-10-CM | POA: Diagnosis not present

## 2019-05-21 DIAGNOSIS — Z87442 Personal history of urinary calculi: Secondary | ICD-10-CM | POA: Insufficient documentation

## 2019-05-21 DIAGNOSIS — I82409 Acute embolism and thrombosis of unspecified deep veins of unspecified lower extremity: Secondary | ICD-10-CM | POA: Diagnosis not present

## 2019-05-21 DIAGNOSIS — K219 Gastro-esophageal reflux disease without esophagitis: Secondary | ICD-10-CM | POA: Diagnosis not present

## 2019-05-21 HISTORY — PX: HOLMIUM LASER APPLICATION: SHX5852

## 2019-05-21 HISTORY — PX: CYSTOSCOPY WITH RETROGRADE PYELOGRAM, URETEROSCOPY AND STENT PLACEMENT: SHX5789

## 2019-05-21 SURGERY — CYSTOURETEROSCOPY, WITH RETROGRADE PYELOGRAM AND STENT INSERTION
Anesthesia: General | Laterality: Left

## 2019-05-21 MED ORDER — PHENYLEPHRINE HCL (PRESSORS) 10 MG/ML IV SOLN
INTRAVENOUS | Status: DC | PRN
  Administered 2019-05-21 (×3): 80 ug via INTRAVENOUS

## 2019-05-21 MED ORDER — ACETAMINOPHEN 10 MG/ML IV SOLN: 1000.0000 mg | Freq: Once | INTRAVENOUS | Status: DC | PRN

## 2019-05-21 MED ORDER — ONDANSETRON HCL 4 MG/2ML IJ SOLN
INTRAMUSCULAR | Status: DC | PRN
  Administered 2019-05-21: 4 mg via INTRAVENOUS

## 2019-05-21 MED ORDER — HYDROMORPHONE HCL 1 MG/ML IJ SOLN: 0.2500 mg | INTRAMUSCULAR | Status: DC | PRN

## 2019-05-21 MED ORDER — SODIUM CHLORIDE 0.9 % IR SOLN
Status: DC | PRN
  Administered 2019-05-21: 3000 mL

## 2019-05-21 MED ORDER — 0.9 % SODIUM CHLORIDE (POUR BTL) OPTIME
TOPICAL | Status: DC | PRN
  Administered 2019-05-21: 1000 mL

## 2019-05-21 MED ORDER — FENTANYL CITRATE (PF) 100 MCG/2ML IJ SOLN: 25.0000 ug | INTRAMUSCULAR | Status: DC | PRN

## 2019-05-21 MED ORDER — FENTANYL CITRATE (PF) 100 MCG/2ML IJ SOLN
INTRAMUSCULAR | Status: AC
  Filled 2019-05-21: qty 2

## 2019-05-21 MED ORDER — LIDOCAINE HCL (CARDIAC) PF 50 MG/5ML IV SOSY
PREFILLED_SYRINGE | INTRAVENOUS | Status: DC | PRN
  Administered 2019-05-21: 100 mg via INTRAVENOUS

## 2019-05-21 MED ORDER — PROPOFOL 10 MG/ML IV BOLUS
INTRAVENOUS | Status: DC | PRN
  Administered 2019-05-21: 50 mg via INTRAVENOUS
  Administered 2019-05-21: 150 mg via INTRAVENOUS

## 2019-05-21 MED ORDER — DEXAMETHASONE SODIUM PHOSPHATE 10 MG/ML IJ SOLN
INTRAMUSCULAR | Status: DC | PRN
  Administered 2019-05-21: 5 mg via INTRAVENOUS

## 2019-05-21 MED ORDER — ONDANSETRON HCL 4 MG/2ML IJ SOLN: 4.0000 mg | Freq: Once | INTRAMUSCULAR | Status: DC | PRN

## 2019-05-21 MED ORDER — MEPERIDINE HCL 50 MG/ML IJ SOLN: 6.2500 mg | INTRAMUSCULAR | Status: DC | PRN

## 2019-05-21 MED ORDER — PROPOFOL 500 MG/50ML IV EMUL
INTRAVENOUS | Status: DC | PRN
  Administered 2019-05-21: 50 ug/kg/min via INTRAVENOUS

## 2019-05-21 MED ORDER — PROPOFOL 10 MG/ML IV BOLUS
INTRAVENOUS | Status: AC
  Filled 2019-05-21: qty 20

## 2019-05-21 MED ORDER — FENTANYL CITRATE (PF) 100 MCG/2ML IJ SOLN
INTRAMUSCULAR | Status: DC | PRN
  Administered 2019-05-21: 100 ug via INTRAVENOUS

## 2019-05-21 MED ORDER — LACTATED RINGERS IV SOLN: INTRAVENOUS | Status: DC

## 2019-05-21 MED ORDER — IOHEXOL 300 MG/ML  SOLN
INTRAMUSCULAR | Status: DC | PRN
  Administered 2019-05-21: 15 mL

## 2019-05-21 MED ORDER — KETOROLAC TROMETHAMINE 10 MG PO TABS: 10.0000 mg | ORAL_TABLET | Freq: Three times a day (TID) | ORAL | 0 refills | Status: DC | PRN

## 2019-05-21 MED ORDER — TRAMADOL HCL 50 MG PO TABS: 50.0000 mg | ORAL_TABLET | Freq: Four times a day (QID) | ORAL | 0 refills | Status: DC | PRN

## 2019-05-21 MED ORDER — CEPHALEXIN 500 MG PO CAPS: 500.0000 mg | ORAL_CAPSULE | Freq: Two times a day (BID) | ORAL | 0 refills | Status: DC

## 2019-05-21 MED ORDER — ONDANSETRON HCL 4 MG/2ML IJ SOLN
INTRAMUSCULAR | Status: AC
  Filled 2019-05-21: qty 2

## 2019-05-21 SURGICAL SUPPLY — 20 items
BAG URO CATCHER STRL LF (MISCELLANEOUS) ×2 IMPLANT
BASKET LASER NITINOL 1.9FR (BASKET) ×2 IMPLANT
CATH INTERMIT  6FR 70CM (CATHETERS) ×2 IMPLANT
CLOTH BEACON ORANGE TIMEOUT ST (SAFETY) ×2 IMPLANT
EXTRACTOR STONE 1.7FRX115CM (UROLOGICAL SUPPLIES) IMPLANT
FIBER LASER FLEXIVA 365 (UROLOGICAL SUPPLIES) IMPLANT
FIBER LASER TRAC TIP (UROLOGICAL SUPPLIES) ×2 IMPLANT
GLOVE BIOGEL M STRL SZ7.5 (GLOVE) ×2 IMPLANT
GOWN STRL REUS W/TWL LRG LVL3 (GOWN DISPOSABLE) ×2 IMPLANT
GUIDEWIRE ANG ZIPWIRE 038X150 (WIRE) ×2 IMPLANT
GUIDEWIRE STR DUAL SENSOR (WIRE) ×2 IMPLANT
KIT TURNOVER KIT A (KITS) IMPLANT
MANIFOLD NEPTUNE II (INSTRUMENTS) ×2 IMPLANT
PACK CYSTO (CUSTOM PROCEDURE TRAY) ×2 IMPLANT
SHEATH URETERAL 12FRX28CM (UROLOGICAL SUPPLIES) IMPLANT
SHEATH URETERAL 12FRX35CM (MISCELLANEOUS) IMPLANT
STENT POLARIS 5FRX22 (STENTS) ×2 IMPLANT
TUBE FEEDING 8FR 16IN STR KANG (MISCELLANEOUS) ×2 IMPLANT
TUBING CONNECTING 10 (TUBING) ×2 IMPLANT
TUBING UROLOGY SET (TUBING) ×2 IMPLANT

## 2019-05-21 NOTE — Brief Op Note (Signed)
05/21/2019  4:34 PM  PATIENT:  Kristen Castillo  79 y.o. female  PRE-OPERATIVE DIAGNOSIS:  HYDRONEPHROSIS, LEFT URETERAL AND RENAL CALCULI  POST-OPERATIVE DIAGNOSIS:  HYDRONEPHROSIS, LEFT URETERAL AND RENAL CALCULI  PROCEDURE:  Procedure(s) with comments: CYSTOSCOPY WITH RETROGRADE PYELOGRAM, URETEROSCOPY AND STENT PLACEMENT (Left) - 1 HR HOLMIUM LASER APPLICATION (Left)  SURGEON:  Surgeon(s) and Role:    Alexis Frock, MD - Primary  PHYSICIAN ASSISTANT:   ASSISTANTS: none   ANESTHESIA:   general  EBL:  minimal   BLOOD ADMINISTERED:none  DRAINS: none   LOCAL MEDICATIONS USED:  NONE  SPECIMEN:  Source of Specimen:  left ureteral stone fragents  DISPOSITION OF SPECIMEN:  disard  COUNTS:  YES  TOURNIQUET:  * No tourniquets in log *  DICTATION: .Other Dictation: Dictation Number [id number]  PLAN OF CARE: Discharge to home after PACU  PATIENT DISPOSITION:  PACU - hemodynamically stable.   Delay start of Pharmacological VTE agent (>24hrs) due to surgical blood loss or risk of bleeding: not applicable

## 2019-05-21 NOTE — Discharge Instructions (Signed)
1 - You may have urinary urgency (bladder spasms) and bloody urine on / off with stent in place. This is normal.  2 - Remove tethered stent at home on Monday morning at home by pulling on string, then blue-white plastic tubing, and discarding. Office is open Monday if any problems arise.   3 - Call MD or go to ER for fever >102, severe pain / nausea / vomiting not relieved by medications, or acute change in medical status

## 2019-05-21 NOTE — H&P (Signed)
Kristen Castillo is an 79 y.o. female.    Chief Complaint: Pre-OP LEFT Ureteroscopic Stone Manipulation  HPI:   1 - Urolithiasis - 29mm left mid ureteral stone with hydro + 47mm LLP sotne by CT 04/2019 on eval recurrent flank pain.   2 - Bilateral Renal Cysts - bilateral minimally complex cortical and peripelvic cysts on imaging x many. Some small wall calcifications, no enhancing nodules or mass effect. DOminant cyst LLP 6.5cm.   PMH sig for abdominal hernia repair with mesh (mech tacks on aterior abd wall on imaging x several), colon resection (diverticulitis) / colsotomy / reversal, TAH for molar preg. Her PCP is Walker Kehr MD.   Today " Kristen Castillo" is seen to proceed with LEFT ureteroscopy with goal of stone free for left ureteral and renal stones. Most recetn UCX negative. C19 screen negative.     Past Medical History:  Diagnosis Date  . Anxiety   . Cancer ([place])    hx skin cancer  . Complication of anesthesia   . Congestion of both ears   . Deviated nasal septum   . Diverticulitis   . DVT (deep venous thrombosis) ([place])   . GERD (gastroesophageal reflux disease)   . Hair loss   . Hematuria    in past  . History of creation of ostomy [facility])    past partial colectomy in 05/03/14  . History of kidney stones   . History of skin cancer   . Kidney cysts   . Migraines    in past  . Molar pregnancy   . Molar pregnancy   . Osteoarthritis    fingers   . Osteoporosis   . Osteoporosis   . Persistent cough   . Pneumonia 2019  . PONV (postoperative nausea and vomiting)    sensitive to medications  . Unspecified vitamin D deficiency   . Ventral hernia     Past Surgical History:  Procedure Laterality Date  . ABDOMINAL HYSTERECTOMY  1972  . CYSTOSCOPY/URETEROSCOPY/HOLMIUM LASER/STENT PLACEMENT Left 02/26/2019   Procedure: CYSTOSCOPY/URETEROSCOPY/HOLMIUM LASER/STENT PLACEMENT FIRST STAGE;  Surgeon: Alexis Frock, MD;  Location: [facility];  Service: Urology;  Laterality: Left;   1 HR  . FACIAL COSMETIC SURGERY  2009   [name] MD in [place]  . ILEO LOOP COLOSTOMY CLOSURE N/A 11/08/2014   Procedure: LAPAROSCOPIC COLOSTOMY CLOSURE WITH PARTIAL COLECTOMY;  Surgeon: [name], MD;  Location: [facility];  Service: General;  Laterality: N/A;  . INSERTION OF MESH N/A 08/01/2015   Procedure: INSERTION OF MESH;  Surgeon: [name], MD;  Location: [facility];  Service: General;  Laterality: N/A;  . LAPAROSCOPIC LYSIS OF ADHESIONS N/A 11/08/2014   Procedure: LAPAROSCOPIC LYSIS OF ADHESIONS FOR 90 MINUTES;  Surgeon: [name], MD;  Location: [facility];  Service: General;  Laterality: N/A;  . LAPAROSCOPIC LYSIS OF ADHESIONS N/A 08/01/2015   Procedure: LAPAROSCOPIC LYSIS OF ADHESIONS;  Surgeon: [name], MD;  [facility];  Service: General;  Laterality: N/A;  . LAPAROSCOPIC PARTIAL COLECTOMY N/A 05/03/2014   Procedure: DIAGNOSTIC LAPAROSCOPY WITH DRAINAGE OF INTRA ABDOMINAL ABSCESS PARTIAL COLECTOMY ;  Surgeon: [name], MD;  Location[facility];  Service: General;  Laterality: N/A;  supine position  . LAPAROTOMY N/A 05/03/2014   Procedure: EXPLORATORY LAPAROTOMY WITH HARTMAN PROCEDURE;  Surgeon: [name], MD;  Location: [facility];  Service: General;  Laterality: N/A;  . LIPOMA RESECTION     left scapula area  . OOPHORECTOMY  1976  . REDUCTION MAMMAPLASTY  1994  .  RHINOPLASTY  1985  . TONSILLECTOMY    . VENTRAL HERNIA REPAIR N/A 08/01/2015   Procedure: LAPAROSCOPIC VENTRAL INCISIONAL  HERNIA;  Surgeon: [name], MD;  Location[facility];  Service: General;  Laterality: N/A;    Family History  Problem Relation Age of Onset  . Arthritis Mother   . Colon cancer Mother 54  . Heart disease Father   . Dementia Father   . Diabetes Sister   . Cancer Maternal Grandfather        Lung cancer  . Heart disease Maternal Grandmother   . Stomach cancer Neg Hx    Social History:  reports that she has never smoked. She has never used smokeless tobacco. She  reports that she does not drink alcohol or use drugs.  Allergies:  Allergies  Allergen Reactions  . Codeine Nausea And Vomiting  . Dilaudid [Hydromorphone Hcl] Other (See Comments)    "too strong - I can't wake up"  . Sulfonamide Derivatives Other (See Comments)    Childhood allergy; reaction unknown  . Tape Other (See Comments)    Causes redness and blisters Able to tolerate paper tape  . Norco [Hydrocodone-Acetaminophen] Rash    No medications prior to admission.    No results found for this or any previous visit (from the past 48 hour(s)). No results found.  Review of Systems  Constitutional: Negative for fever.  Genitourinary: Positive for flank pain.  All other systems reviewed and are negative.   There were no vitals taken for this visit. Physical Exam  Constitutional: She appears well-developed.  Very pleasant, at baseline.   HENT:  Head: Normocephalic.  Eyes: Pupils are equal, round, and reactive to light.  Cardiovascular: Normal rate.  Respiratory: Effort normal.  GI: Soft.  Multiple scars.   Genitourinary:    Genitourinary Comments: Mild left CVAT at present.    Musculoskeletal:     Cervical back: Normal range of motion.  Skin: Skin is warm.     Assessment/Plan  Proceed as planned with LEFT ureteroscopic stone manipulation. Risks, benefits, alternatives, expected peri-op course discussed previously and reiterated today. We also discussed her narrow caliber left ureter may pose tchnical problems again andy may require multi-stage approach.  Alexis Frock, MD 05/21/2019, 7:32 AM

## 2019-05-21 NOTE — Transfer of Care (Signed)
Immediate Anesthesia Transfer of Care Note  Patient: Kristen Castillo  Procedure(s) Performed: CYSTOSCOPY WITH RETROGRADE PYELOGRAM, URETEROSCOPY AND STENT PLACEMENT (Left ) HOLMIUM LASER APPLICATION (Left )  Patient Location: PACU  Anesthesia Type:General  Level of Consciousness: awake, alert , oriented and patient cooperative  Airway & Oxygen Therapy: Patient Spontanous Breathing and Patient connected to face mask oxygen  Post-op Assessment: Report given to RN, Post -op Vital signs reviewed and stable and Patient moving all extremities X 4  Post vital signs: stable  Last Vitals:  Vitals Value Taken Time  BP 145/63 05/21/19 1647  Temp 36.8 C 05/21/19 1647  Pulse 69 05/21/19 1651  Resp 17 05/21/19 1651  SpO2 100 % 05/21/19 1651  Vitals shown include unvalidated device data.  Last Pain:  Vitals:   05/21/19 1647  PainSc: 0-No pain         Complications: No apparent anesthesia complications

## 2019-05-21 NOTE — Anesthesia Procedure Notes (Signed)
Procedure Name: LMA Insertion Date/Time: 05/21/2019 4:00 PM Performed by: Lissa Morales, CRNA Pre-anesthesia Checklist: Patient identified, Emergency Drugs available, Suction available and Patient being monitored Patient Re-evaluated:Patient Re-evaluated prior to induction Oxygen Delivery Method: Circle system utilized Preoxygenation: Pre-oxygenation with 100% oxygen Induction Type: IV induction Ventilation: Mask ventilation without difficulty LMA: LMA with gastric port inserted LMA Size: 4.0 Tube type: Oral Number of attempts: 1 Airway Equipment and Method: Oral airway Placement Confirmation: ETT inserted through vocal cords under direct vision,  positive ETCO2 and breath sounds checked- equal and bilateral Tube secured with: Tape Dental Injury: Teeth and Oropharynx as per pre-operative assessment

## 2019-05-21 NOTE — Anesthesia Postprocedure Evaluation (Signed)
Anesthesia Post Note  Patient: Kristen Castillo  Procedure(s) Performed: CYSTOSCOPY WITH RETROGRADE PYELOGRAM, URETEROSCOPY AND STENT PLACEMENT (Left ) HOLMIUM LASER APPLICATION (Left )     Patient location during evaluation: PACU Anesthesia Type: General Level of consciousness: awake and alert Pain management: pain level controlled Vital Signs Assessment: post-procedure vital signs reviewed and stable Respiratory status: spontaneous breathing, nonlabored ventilation, respiratory function stable and patient connected to nasal cannula oxygen Cardiovascular status: blood pressure returned to baseline and stable Postop Assessment: no apparent nausea or vomiting Anesthetic complications: no    Last Vitals:  Vitals:   05/21/19 1312 05/21/19 1647  BP: (!) 156/68 (!) 145/63  Pulse: 63 74  Resp: 16 15  Temp:  36.8 C  SpO2: 100% 100%    Last Pain:  Vitals:   05/21/19 1647  PainSc: 0-No pain                 Quindon Denker DAVID

## 2019-05-21 NOTE — OR Nursing (Signed)
Stone taken by Dr. Manny 

## 2019-05-22 NOTE — Op Note (Signed)
NAMEJEWELL, AMBRIZ [facility] MEDICAL RECORD [id number] ACCOUNT [id number] DATE OF BIRTH:18-Sep-1940 FACILITY: [facility] LOCATION: [facility] PHYSICIAN:Nashali Ditmer Tresa Moore, MD  OPERATIVE REPORT  DATE OF PROCEDURE:  05/21/2019  SURGEON:  Alexis Frock, MD  PREOPERATIVE DIAGNOSIS:  Recurrent urolithiasis, left ureteral stone.  PROCEDURES: 1.  Cystoscopy, left retrograde pyelogram, interpretation. 2.  Left ureteroscopy with laser lithotripsy. 3.  Insertion of left ureteral stent, 5 x 22 Polaris with tether.  ESTIMATED BLOOD LOSS:  Nil.  COMPLICATIONS:  None.  SPECIMENS:  Left ureteral stone fragments were discarded.  OPERATIVE FINDINGS: 1.  Left distal ureteral stone with mild proximal hydronephrosis. 2.  Very narrow intramural ureter as per prior. 3.  Complete resolution of all ureteral stone fragments larger than one-third mm following laser lithotripsy and basket extraction. 4.  Successful placement of left ureteral stent, proximal end in renal pelvis, distal end in urinary bladder.  INDICATIONS:  The patient is a very pleasant 79 year old woman who unfortunately has a history of recurrent urolithiasis.  She has had a recent recurrence that has fortunately not been terribly painful, but has resulted in intermittent obstruction with  continued hydronephrosis of her left kidney.  Options were discussed for management, including continued medical therapy versus shockwave lithotripsy versus ureteroscopy.  Given distal location and stone size, and in order to protect her kidney, she  wished to proceed with left ureteroscopy.  Informed consent was obtained and placed in the medical record.  DESCRIPTION OF PROCEDURE:  The patient being identified, the procedure being left ureteroscopic stimulation was confirmed.  Procedure timeout was performed.  Intravenous antibiotics administered.  General LMA anesthesia induced.  The patient was placed  into a low lithotomy position.  A sterile field was  created, prepping and draping the vagina, introitus and proximal thighs using iodine.  Cystourethroscopy was performed using a 21-French rigid cystoscope with offset lens.  Inspection of bladder  revealed no diverticula, calcifications, some palpable lesions.  The left ureteral orifice was cannulated with 6-French renal catheter and left retrograde pyelogram was obtained.  Left retrograde pyelogram demonstrated a single left ureter, single system left kidney.  There was a filling defect in the distal fourth of the ureter consistent with known stone.  There was mild hydronephrosis above this.  A 0.038 ZIPwire was advanced  to the lower pole and left as a safety wire.  An 8-French feeding tube was placed in the urinary bladder for pressure release and semirigid ureteroscopy performed of the distal left ureter alongside a separate sensor working wire.  As per prior  procedures, her intramural ureter was exquisitely narrow, but did accommodate the 6-French semirigid scope using a ____ tracking technique and the left ureteral stone was then visualized.  It was much too large for simple basketing.  Holmium laser energy  was then applied at 70, setting of 0.2 joules and 20 Hz and using a dusting technique, approximately 80% of the stone was ablated.  The remaining 20% was fragmented into small pieces approximately 1 mm to 2 mm in diameter.  These were again sequentially  grasped with an escape basket and brought out in their entirety, set aside for discard.  Repeat ureteroscopy of the distal two-thirds of left ureter revealed complete resolution of all stone fragments, no evidence of significant mucosal abnormality and  final retrograde pyelogram corroborated no residual filling defects in the left ureter.  Given her exquisitely narrow distal ureter, it was felt that brief interval stenting with tethered stent be warranted.  As such, a new 5 x  59 Polaris-type stent was  placed with remaining safety wire using  fluoroscopic guidance.  Good proximal and distal plane were noted.  Tether was left in place and tucked per vagina and the procedure was terminated.  The patient tolerated the procedure well.  No immediate  perioperative complications.  The patient was taken to postanesthesia care unit in stable condition.  Plan for discharge home.  VN/NUANCE  D:05/21/2019 T:05/21/2019 JOB:[id number]

## 2019-05-31 DIAGNOSIS — N202 Calculus of kidney with calculus of ureter: Secondary | ICD-10-CM | POA: Diagnosis not present

## 2019-05-31 DIAGNOSIS — N281 Cyst of kidney, acquired: Secondary | ICD-10-CM | POA: Diagnosis not present

## 2019-06-14 ENCOUNTER — Other Ambulatory Visit: Payer: Self-pay | Admitting: Internal Medicine

## 2019-06-14 ENCOUNTER — Other Ambulatory Visit: Payer: Self-pay | Admitting: Emergency Medicine

## 2019-06-14 DIAGNOSIS — R05 Cough: Secondary | ICD-10-CM

## 2019-06-14 DIAGNOSIS — R059 Cough, unspecified: Secondary | ICD-10-CM

## 2019-06-14 DIAGNOSIS — Z7189 Other specified counseling: Secondary | ICD-10-CM

## 2019-06-16 ENCOUNTER — Encounter: Payer: Self-pay | Admitting: Emergency Medicine

## 2019-06-16 ENCOUNTER — Ambulatory Visit (INDEPENDENT_AMBULATORY_CARE_PROVIDER_SITE_OTHER): Payer: Medicare Other | Admitting: Emergency Medicine

## 2019-06-16 ENCOUNTER — Other Ambulatory Visit: Payer: Self-pay

## 2019-06-16 ENCOUNTER — Ambulatory Visit: Payer: Medicare Other | Admitting: *Deleted

## 2019-06-16 DIAGNOSIS — J301 Allergic rhinitis due to pollen: Secondary | ICD-10-CM | POA: Diagnosis not present

## 2019-06-16 DIAGNOSIS — R05 Cough: Secondary | ICD-10-CM | POA: Diagnosis not present

## 2019-06-16 DIAGNOSIS — R059 Cough, unspecified: Secondary | ICD-10-CM

## 2019-06-16 DIAGNOSIS — M810 Age-related osteoporosis without current pathological fracture: Secondary | ICD-10-CM

## 2019-06-16 DIAGNOSIS — I2692 Saddle embolus of pulmonary artery without acute cor pulmonale: Secondary | ICD-10-CM | POA: Diagnosis not present

## 2019-06-16 DIAGNOSIS — K219 Gastro-esophageal reflux disease without esophagitis: Secondary | ICD-10-CM | POA: Diagnosis not present

## 2019-06-16 MED ORDER — DENOSUMAB 60 MG/ML ~~LOC~~ SOSY
60.0000 mg | PREFILLED_SYRINGE | Freq: Once | SUBCUTANEOUS | Status: AC
  Administered 2019-06-16: 60 mg via SUBCUTANEOUS

## 2019-06-16 MED ORDER — APIXABAN 2.5 MG PO TABS: 2.5000 mg | ORAL_TABLET | Freq: Two times a day (BID) | ORAL | 12 refills | Status: DC

## 2019-06-16 NOTE — Patient Instructions (Signed)
Please continue your Eliquis 2.5mg  twice a day.  Call our office for any concerns about bleeding, side effects. Continue your famotidine daily as you have been taking it Continue Rhinocort nasal spray as needed Continue your benzonatate (Tessalon) as needed for cough suppression. Follow with Dr. Lamonte Sakai in 12 months or sooner if you have any problems.

## 2019-06-16 NOTE — Progress Notes (Signed)
   Subjective:    Patient ID: Kristen Castillo, female    DOB: [birth year], 79 y.o.   MRN: [id number]  HPI 79 year old never smoker known to me from recent hospitalization. 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 She had several abdominal hernias and required mesh placement in May 2017. I met her in June 2017 after she was admitted for a large right lower extremity DVT and associated saddle pulmonary embolism. She underwent targeted lytic therapy and was admitted to the ICU. She recovered well without any evidence of hypoxemia. Her echocardiogram initially showed evidence of right heart strain. She was discharged on Eliquis. She has been having cough since discharge home. Clear mucous. She has been on loratadine, rhinocort, restarted delsym and tessalon perles for a few months.   ROV 05/26/2018 --Kristen Castillo is 37.  She has a history of a remote bowel perforation that required colostomy and then was subsequently reversed.  Subsequently she experienced a large pulmonary embolism with hemodynamic instability for which she underwent targeted lytic therapy.  She was discharged on therapeutic Eliquis.  She has been on Eliquis 2.5 mg twice daily since.  She just underwent cataract surgery in late January, had her Eliquis held temporarily. Now back on 2.'5mg'$  bid. She has some chronic hematuria and is being followed by Urology. I don't have those records yet. No other evidence bleeding.   ROV 06/16/2019 --follow-up visit for 79 year old woman with a history of large pulmonary embolism following bowel surgery, mesh placement.  She underwent targeted lysis and had improvement in right heart strain on echocardiogram.  We have been following her now on Eliquis 2.5 mg twice daily.  She also has chronic cough in the setting of GERD and we have been treating with famotidine. She uses tessalon at night. She is also on Rhinocort.    Review of  Systems As per HPI     Objective:   Physical Exam Vitals:   06/16/19 1334  BP: 126/74  Pulse: 85  Temp: 98.2 F (36.8 C)  TempSrc: Temporal  SpO2: 97%  Weight: 134 lb 6.4 oz (61 kg)  Height: '5\' 2"'$  (1.575 m)    Gen: Pleasant, well-nourished, in no distress,  normal affect  ENT: No lesions,  mouth clear,  oropharynx clear, no postnasal drip, strong voice  Neck: No JVD, no stridor  Lungs: No use of accessory muscles, clear B, no wheeze or crackles.   Cardiovascular: RRR, heart sounds normal, no murmur or gallops, no edema  Musculoskeletal: No deformities, no cyanosis or clubbing  Neuro: alert, non focal  Skin: Warm, no lesions or rashes      Assessment & Plan:  Pulmonary embolism (HCC) History of saddle PE, targeted lysis.  We have decided to continue low-dose Eliquis 2.5 mg twice daily long-term.  She is tolerating well.  No side effects.  No bleeding noted.  She knows to call me if this occurs.  Allergic rhinitis Rhinocort as needed  GERD (gastroesophageal reflux disease) Tolerating and benefiting from famotidine  Cough Treating GERD, rhinitis.  Benzonatate for cough suppression as needed  Baltazar Apo, MD, PhD 06/16/2019, 1:54 PM [facility] [phone] or if no answer [phone]

## 2019-06-16 NOTE — Assessment & Plan Note (Signed)
Rhinocort as needed

## 2019-06-16 NOTE — Progress Notes (Signed)
Pls cosign for prolia../lmb 

## 2019-06-16 NOTE — Assessment & Plan Note (Signed)
Tolerating and benefiting from famotidine

## 2019-06-16 NOTE — Assessment & Plan Note (Signed)
Treating GERD, rhinitis.  Benzonatate for cough suppression as needed

## 2019-06-16 NOTE — Assessment & Plan Note (Signed)
History of saddle PE, targeted lysis.  We have decided to continue low-dose Eliquis 2.5 mg twice daily long-term.  She is tolerating well.  No side effects.  No bleeding noted.  She knows to call me if this occurs.

## 2019-06-17 DIAGNOSIS — H6123 Impacted cerumen, bilateral: Secondary | ICD-10-CM | POA: Insufficient documentation

## 2019-06-17 DIAGNOSIS — H938X2 Other specified disorders of left ear: Secondary | ICD-10-CM | POA: Diagnosis not present

## 2019-06-17 DIAGNOSIS — H903 Sensorineural hearing loss, bilateral: Secondary | ICD-10-CM | POA: Diagnosis not present

## 2019-06-17 DIAGNOSIS — Z974 Presence of external hearing-aid: Secondary | ICD-10-CM | POA: Diagnosis not present

## 2019-06-17 DIAGNOSIS — H9113 Presbycusis, bilateral: Secondary | ICD-10-CM | POA: Insufficient documentation

## 2019-06-17 DIAGNOSIS — H6121 Impacted cerumen, right ear: Secondary | ICD-10-CM | POA: Diagnosis not present

## 2019-06-22 ENCOUNTER — Telehealth: Payer: Self-pay | Admitting: Emergency Medicine

## 2019-06-22 ENCOUNTER — Other Ambulatory Visit: Payer: Medicare Other

## 2019-06-22 DIAGNOSIS — I82401 Acute embolism and thrombosis of unspecified deep veins of right lower extremity: Secondary | ICD-10-CM

## 2019-06-22 DIAGNOSIS — N23 Unspecified renal colic: Secondary | ICD-10-CM | POA: Diagnosis not present

## 2019-06-22 DIAGNOSIS — N200 Calculus of kidney: Secondary | ICD-10-CM | POA: Diagnosis not present

## 2019-06-22 LAB — D-DIMER, QUANTITATIVE: D-Dimer, Quant: 0.29 mcg/mL FEU (ref <0.50)

## 2019-06-22 NOTE — Telephone Encounter (Signed)
Spoke with patient. She stated that her right leg has been cramping for the past few days and she has noticed an increase in her SOB. She is concerned because she had a DVT in her right leg a few years ago and the symptoms feel similar. She only notices the SOB with exertion. Her leg is not warm to touch. She denies being on any diuretics or having issues with her potassium that she is aware of. She did state that she is currently dealing with kidney stones and wonders if this is related. She has an appt with [facility] this afternoon for follow up.   She is still taking her Eliquis everyday. Denies being around anyone with COVID. No fevers.   She stated that she is not in any distress but she is concerned based on her history.   She is aware that RB is at the hospital today but requested I send the message to [facility], who she has seen before in the past.   Beth, please advise. Thanks!

## 2019-06-22 NOTE — Telephone Encounter (Signed)
Unlikely that she has a blood clot while on blood thinner. Would recommend checking d-dimer. If that is elevated than consider doppler study. Please order D-dimer

## 2019-06-22 NOTE — Telephone Encounter (Signed)
Spoke with patient, she is aware of Beth's recs. While on the phone, she stated that she was still at [facility]. I advised her that instead of her coming to out office, she could go to the lab across the street at our old office. She verbalized understanding. She is aware that someone will call her with the results as soon as they are available.   Order has been placed as stat.   Nothing further needed at time of call.

## 2019-06-23 DIAGNOSIS — Z85828 Personal history of other malignant neoplasm of skin: Secondary | ICD-10-CM | POA: Diagnosis not present

## 2019-06-23 DIAGNOSIS — L570 Actinic keratosis: Secondary | ICD-10-CM | POA: Diagnosis not present

## 2019-06-23 NOTE — Progress Notes (Signed)
Please let patient know d-dimer was negative. Lncreased levels of d-dimer are associated with PE and DVT

## 2019-06-24 DIAGNOSIS — H903 Sensorineural hearing loss, bilateral: Secondary | ICD-10-CM | POA: Diagnosis not present

## 2019-06-29 DIAGNOSIS — N200 Calculus of kidney: Secondary | ICD-10-CM | POA: Diagnosis not present

## 2019-07-07 DIAGNOSIS — N200 Calculus of kidney: Secondary | ICD-10-CM | POA: Diagnosis not present

## 2019-07-12 ENCOUNTER — Ambulatory Visit (INDEPENDENT_AMBULATORY_CARE_PROVIDER_SITE_OTHER): Payer: Medicare Other

## 2019-07-12 ENCOUNTER — Other Ambulatory Visit: Payer: Self-pay

## 2019-07-12 ENCOUNTER — Ambulatory Visit: Payer: Medicare Other | Admitting: Internal Medicine

## 2019-07-12 ENCOUNTER — Encounter: Payer: Self-pay | Admitting: Internal Medicine

## 2019-07-12 VITALS — BP 152/86 | HR 95 | Temp 98.4°F | Ht 62.0 in | Wt 128.0 lb

## 2019-07-12 DIAGNOSIS — R202 Paresthesia of skin: Secondary | ICD-10-CM

## 2019-07-12 DIAGNOSIS — R140 Abdominal distension (gaseous): Secondary | ICD-10-CM

## 2019-07-12 DIAGNOSIS — R5383 Other fatigue: Secondary | ICD-10-CM | POA: Diagnosis not present

## 2019-07-12 DIAGNOSIS — N200 Calculus of kidney: Secondary | ICD-10-CM | POA: Diagnosis not present

## 2019-07-12 LAB — HEPATIC FUNCTION PANEL
ALT: 7 U/L (ref 0–35)
AST: 12 U/L (ref 0–37)
Albumin: 4 g/dL (ref 3.5–5.2)
Alkaline Phosphatase: 98 U/L (ref 39–117)
Bilirubin, Direct: 0 mg/dL (ref 0.0–0.3)
Total Bilirubin: 0.6 mg/dL (ref 0.2–1.2)
Total Protein: 8 g/dL (ref 6.0–8.3)

## 2019-07-12 LAB — BASIC METABOLIC PANEL
BUN: 34 mg/dL — ABNORMAL HIGH (ref 6–23)
CO2: 23 mEq/L (ref 19–32)
Calcium: 9.3 mg/dL (ref 8.4–10.5)
Chloride: 103 mEq/L (ref 96–112)
Creatinine, Ser: 1.68 mg/dL — ABNORMAL HIGH (ref 0.40–1.20)
GFR: 29.42 mL/min — ABNORMAL LOW (ref >60.00)
Glucose, Bld: 119 mg/dL — ABNORMAL HIGH (ref 70–99)
Potassium: 4.6 mEq/L (ref 3.5–5.1)
Sodium: 135 mEq/L (ref 135–145)

## 2019-07-12 LAB — CBC WITH DIFFERENTIAL/PLATELET
Basophils Absolute: 0.1 10*3/uL (ref 0.0–0.1)
Basophils Relative: 0.8 % (ref 0.0–3.0)
Eosinophils Absolute: 0.2 10*3/uL (ref 0.0–0.7)
Eosinophils Relative: 2.3 % (ref 0.0–5.0)
HCT: 38.6 % (ref 36.0–46.0)
Hemoglobin: 12.7 g/dL (ref 12.0–15.0)
Lymphocytes Relative: 14.3 % (ref 12.0–46.0)
Lymphs Abs: 1.3 10*3/uL (ref 0.7–4.0)
MCHC: 33 g/dL (ref 30.0–36.0)
MCV: 87.4 fl (ref 78.0–100.0)
Monocytes Absolute: 0.6 10*3/uL (ref 0.1–1.0)
Monocytes Relative: 6.4 % (ref 3.0–12.0)
Neutro Abs: 7 10*3/uL (ref 1.4–7.7)
Neutrophils Relative %: 76.2 % (ref 43.0–77.0)
Platelets: 425 10*3/uL — ABNORMAL HIGH (ref 150.0–400.0)
RBC: 4.41 Mil/uL (ref 3.87–5.11)
RDW: 14.6 % (ref 11.5–15.5)
WBC: 9.2 10*3/uL (ref 4.0–10.5)

## 2019-07-12 LAB — URINALYSIS, ROUTINE W REFLEX MICROSCOPIC
Bilirubin Urine: NEGATIVE
Ketones, ur: NEGATIVE
Leukocytes,Ua: NEGATIVE
Nitrite: NEGATIVE
Specific Gravity, Urine: >=1.03 — AB (ref 1.000–1.030)
Total Protein, Urine: 100 — AB
Urine Glucose: NEGATIVE
Urobilinogen, UA: 0.2 (ref 0.0–1.0)
pH: 5.5 (ref 5.0–8.0)

## 2019-07-12 LAB — TSH: TSH: 2.37 u[IU]/mL (ref 0.35–4.50)

## 2019-07-12 LAB — T4, FREE: Free T4: 1.03 ng/dL (ref 0.60–1.60)

## 2019-07-12 LAB — VITAMIN B12: Vitamin B-12: 592 pg/mL (ref 211–911)

## 2019-07-12 MED ORDER — ALIGN 4 MG PO CAPS: 1.0000 | ORAL_CAPSULE | Freq: Every day | ORAL | 0 refills | Status: DC

## 2019-07-12 NOTE — Addendum Note (Signed)
Addended by: Trenda Moots on: [id number] 10:36 AM   Modules accepted: Orders

## 2019-07-12 NOTE — Progress Notes (Signed)
Subjective:  Patient ID: Kristen Castillo, female    DOB: August 09, 1940  Age: 79 y.o. MRN: [id number]  CC: No chief complaint on file.   HPI Kristen Castillo presents for fatigue at times x 1-2 months, occ bloating, abd discomfort at times. Kristen Castillo had a kidney stone, CT abd and [name] in March 2021 Taking care of 2 grandkids  Outpatient Medications Prior to Visit  Medication Sig Dispense Refill  . apixaban (ELIQUIS) 2.5 MG TABS tablet Take 1 tablet (2.5 mg total) by mouth 2 (two) times daily. MUST HAVE OV FOR FURTHER REFILLS 60 tablet 12  . benzonatate (TESSALON) 100 MG capsule TAKE  (1)  CAPSULE THREE TIMES DAILY AS NEEDED. 60 capsule 0  . Bromfenac Sodium (PROLENSA) 0.07 % SOLN Place 1 drop into the left eye daily.    . budesonide (RHINOCORT ALLERGY) 32 MCG/ACT nasal spray Place 1 spray into both nostrils in the morning and at bedtime.     . Cholecalciferol (VITAMIN D3) 1.25 MG (50000 UT) CAPS Take 1 capsule by mouth every 30 (thirty) days. 3 capsule 3  . denosumab (PROLIA) 60 MG/ML SOLN injection Inject 60 mg into the skin every 6 (six) months. Administer in upper arm, thigh, or abdomen    . famotidine (PEPCID) 20 MG tablet Take 20 mg by mouth at bedtime.     Marland Kitchen LORazepam (ATIVAN) 0.5 MG tablet TAKE (1) TABLET TWICE DAILY AS NEEDED FOR ANXIETY. (Patient taking differently: daily as needed. ) 60 tablet 1  . Multiple Vitamin (MULTIVITAMIN) tablet Take 1 tablet by mouth at bedtime.     Vladimir Faster Glycol-Propyl Glycol (SYSTANE OP) Place 1 drop into both eyes 3 (three) times daily as needed (dry eyes).    . calcium carbonate (OS-CAL) 600 MG TABS tablet Take 600 mg by mouth daily.      No facility-administered medications prior to visit.    ROS: Review of Systems  Constitutional: Positive for fatigue. Negative for activity change, appetite change, chills and unexpected weight change.  HENT: Negative for congestion, mouth sores and sinus pressure.   Eyes: Negative for visual disturbance.    Respiratory: Negative for cough and chest tightness.   Gastrointestinal: Positive for abdominal distention. Negative for abdominal pain and nausea.  Genitourinary: Negative for difficulty urinating, frequency and vaginal pain.  Musculoskeletal: Negative for back pain and gait problem.  Skin: Negative for pallor and rash.  Neurological: Negative for dizziness, tremors, weakness, numbness and headaches.  Psychiatric/Behavioral: Negative for confusion and sleep disturbance.    Objective:  BP (!) 152/86 (BP Location: Left Arm, Patient Position: Sitting, Cuff Size: Normal)   Pulse 95   Temp 98.4 F (36.9 C) (Oral)   Ht 5\' 2"  (1.575 m)   Wt 128 lb (58.1 kg)   SpO2 97%   BMI 23.41 kg/m   BP Readings from Last 3 Encounters:  07/12/19 (!) 152/86  06/16/19 126/74  05/21/19 130/63    Wt Readings from Last 3 Encounters:  07/12/19 128 lb (58.1 kg)  06/16/19 134 lb 6.4 oz (61 kg)  05/11/19 124 lb (56.2 kg)    Physical Exam Constitutional:      General: She is not in acute distress.    Appearance: She is well-developed.  HENT:     Head: Normocephalic.     Right Ear: External ear normal.     Left Ear: External ear normal.     Nose: Nose normal.  Eyes:     General:  Right eye: No discharge.        Left eye: No discharge.     Conjunctiva/sclera: Conjunctivae normal.     Pupils: Pupils are equal, round, and reactive to light.  Neck:     Thyroid: No thyromegaly.     Vascular: No JVD.     Trachea: No tracheal deviation.  Cardiovascular:     Rate and Rhythm: Normal rate and regular rhythm.     Heart sounds: Normal heart sounds.  Pulmonary:     Effort: No respiratory distress.     Breath sounds: No stridor. No wheezing.  Abdominal:     General: Bowel sounds are normal. There is distension.     Palpations: Abdomen is soft. There is no mass.     Tenderness: There is no abdominal tenderness. There is no guarding or rebound.  Musculoskeletal:        General: No tenderness.      Cervical back: Normal range of motion and neck supple.  Lymphadenopathy:     Cervical: No cervical adenopathy.  Skin:    Findings: No erythema or rash.  Neurological:     Cranial Nerves: No cranial nerve deficit.     Motor: No abnormal muscle tone.     Coordination: Coordination normal.     Deep Tendon Reflexes: Reflexes normal.  Psychiatric:        Behavior: Behavior normal.        Thought Content: Thought content normal.        Judgment: Judgment normal.   abd is a little distended  Lab Results  Component Value Date   WBC 8.1 05/11/2019   HGB 13.2 05/11/2019   HCT 41.1 05/11/2019   PLT 339.0 05/11/2019   GLUCOSE 105 (H) 05/11/2019   CHOL 208 (H) 11/03/2018   TRIG 176.0 (H) 11/03/2018   HDL 55.80 11/03/2018   LDLCALC 117 (H) 11/03/2018   ALT 7 05/11/2019   AST 15 05/11/2019   NA 141 05/11/2019   K 4.0 05/11/2019   CL 106 05/11/2019   CREATININE 1.15 05/11/2019   BUN 32 (H) 05/11/2019   CO2 25 05/11/2019   TSH 2.34 05/11/2019   INR 1.3 (H) 05/11/2019   HGBA1C 6.0 (H) 11/03/2014    No results found.  Assessment & Plan:   There are no diagnoses linked to this encounter.   No orders of the defined types were placed in this encounter.    Follow-up: No follow-ups on file.  Walker Kehr, MD

## 2019-07-12 NOTE — Assessment & Plan Note (Signed)
C/o occ bloating, abd discomfort at times. Derna had a kidney stone, CT abd and [name] in March 2021  Labs Probiotic Consider Creon

## 2019-07-13 ENCOUNTER — Other Ambulatory Visit: Payer: Medicare Other

## 2019-07-13 ENCOUNTER — Telehealth: Payer: Self-pay

## 2019-07-13 DIAGNOSIS — R8290 Unspecified abnormal findings in urine: Secondary | ICD-10-CM | POA: Diagnosis not present

## 2019-07-13 NOTE — Telephone Encounter (Signed)
New message   Seen on 5.3.2021 urine sample was performed.   Mychart message is unclear if she should come into the office today or will yesterday urine be okay   Please advise

## 2019-07-13 NOTE — Telephone Encounter (Signed)
See my chart message

## 2019-07-14 LAB — URINE CULTURE
MICRO NUMBER:: 10437114
SPECIMEN QUALITY:: ADEQUATE

## 2019-07-17 ENCOUNTER — Other Ambulatory Visit: Payer: Self-pay | Admitting: Internal Medicine

## 2019-07-17 DIAGNOSIS — Z7189 Other specified counseling: Secondary | ICD-10-CM

## 2019-07-17 DIAGNOSIS — R059 Cough, unspecified: Secondary | ICD-10-CM

## 2019-07-17 DIAGNOSIS — R05 Cough: Secondary | ICD-10-CM

## 2019-07-29 ENCOUNTER — Ambulatory Visit: Payer: Medicare Other | Admitting: Internal Medicine

## 2019-07-29 ENCOUNTER — Encounter: Payer: Self-pay | Admitting: Internal Medicine

## 2019-07-29 ENCOUNTER — Other Ambulatory Visit: Payer: Self-pay

## 2019-07-29 VITALS — BP 148/88 | HR 68 | Temp 98.0°F | Ht 62.0 in | Wt 130.6 lb

## 2019-07-29 DIAGNOSIS — R5383 Other fatigue: Secondary | ICD-10-CM

## 2019-07-29 DIAGNOSIS — N189 Chronic kidney disease, unspecified: Secondary | ICD-10-CM | POA: Insufficient documentation

## 2019-07-29 DIAGNOSIS — N1832 Chronic kidney disease, stage 3b: Secondary | ICD-10-CM

## 2019-07-29 DIAGNOSIS — R1084 Generalized abdominal pain: Secondary | ICD-10-CM

## 2019-07-29 DIAGNOSIS — K8020 Calculus of gallbladder without cholecystitis without obstruction: Secondary | ICD-10-CM

## 2019-07-29 DIAGNOSIS — R140 Abdominal distension (gaseous): Secondary | ICD-10-CM

## 2019-07-29 LAB — BASIC METABOLIC PANEL
BUN: 34 mg/dL — ABNORMAL HIGH (ref 6–23)
CO2: 25 mEq/L (ref 19–32)
Calcium: 9.8 mg/dL (ref 8.4–10.5)
Chloride: 104 mEq/L (ref 96–112)
Creatinine, Ser: 1.18 mg/dL (ref 0.40–1.20)
GFR: 44.23 mL/min — ABNORMAL LOW (ref >60.00)
Glucose, Bld: 117 mg/dL — ABNORMAL HIGH (ref 70–99)
Potassium: 4.4 mEq/L (ref 3.5–5.1)
Sodium: 137 mEq/L (ref 135–145)

## 2019-07-29 LAB — HEPATIC FUNCTION PANEL
ALT: 11 U/L (ref 0–35)
AST: 15 U/L (ref 0–37)
Albumin: 4.2 g/dL (ref 3.5–5.2)
Alkaline Phosphatase: 80 U/L (ref 39–117)
Bilirubin, Direct: 0.1 mg/dL (ref 0.0–0.3)
Total Bilirubin: 0.6 mg/dL (ref 0.2–1.2)
Total Protein: 7.8 g/dL (ref 6.0–8.3)

## 2019-07-29 LAB — URINALYSIS, ROUTINE W REFLEX MICROSCOPIC
Bilirubin Urine: NEGATIVE
Ketones, ur: NEGATIVE
Leukocytes,Ua: NEGATIVE
Nitrite: NEGATIVE
Specific Gravity, Urine: 1.025 (ref 1.000–1.030)
Total Protein, Urine: 100 — AB
Urine Glucose: NEGATIVE
Urobilinogen, UA: 0.2 (ref 0.0–1.0)
pH: 5 (ref 5.0–8.0)

## 2019-07-29 LAB — SEDIMENTATION RATE: Sed Rate: 36 mm/hr — ABNORMAL HIGH (ref 0–30)

## 2019-07-29 NOTE — Patient Instructions (Signed)
Activated charcoal capsules for bloating/pain

## 2019-07-29 NOTE — Assessment & Plan Note (Signed)
  New R Dr Tresa Moore on Mon Abd [name] Labs Activated charcoal capsules

## 2019-07-29 NOTE — Assessment & Plan Note (Signed)
Better Abd [name]

## 2019-07-29 NOTE — Assessment & Plan Note (Signed)
CT w/tiny stones, no cholecystitis [name] Labs

## 2019-07-29 NOTE — Assessment & Plan Note (Signed)
[name] abd Hydrate well Labs

## 2019-07-29 NOTE — Progress Notes (Signed)
   Subjective:  Patient ID: Kristen Castillo, female    DOB: [birth year]  Age: 79 y.o. MRN: [id number]  CC: Abdominal Pain (Abdominal pain on the right side (started on Monday))   HPI Kristen Castillo presents for abd distention, fatigue. Align helped C/o a burning pain in R abd since Mon 3/10 - burning, constant F/u low GFR 29  Outpatient Medications Prior to Visit  Medication Sig Dispense Refill  . apixaban (ELIQUIS) 2.5 MG TABS tablet Take 1 tablet (2.5 mg total) by mouth 2 (two) times daily. MUST HAVE OV FOR FURTHER REFILLS 60 tablet 12  . benzonatate (TESSALON) 100 MG capsule TAKE  (1)  CAPSULE THREE TIMES DAILY AS NEEDED. 60 capsule 0  . Bromfenac Sodium (PROLENSA) 0.07 % SOLN Place 1 drop into the left eye daily.    . budesonide (RHINOCORT ALLERGY) 32 MCG/ACT nasal spray Place 1 spray into both nostrils in the morning and at bedtime.     . calcium carbonate (OS-CAL) 600 MG TABS tablet Take 600 mg by mouth daily.     . Cholecalciferol (VITAMIN D3) 1.25 MG (50000 UT) CAPS Take 1 capsule by mouth every 30 (thirty) days. 3 capsule 3  . denosumab (PROLIA) 60 MG/ML SOLN injection Inject 60 mg into the skin every 6 (six) months. Administer in upper arm, thigh, or abdomen    . famotidine (PEPCID) 20 MG tablet Take 20 mg by mouth at bedtime.     Marland Kitchen LORazepam (ATIVAN) 0.5 MG tablet Take 1 tablet (0.5 mg total) by mouth daily as needed. 30 tablet 1  . Multiple Vitamin (MULTIVITAMIN) tablet Take 1 tablet by mouth at bedtime.     Vladimir Faster Glycol-Propyl Glycol (SYSTANE OP) Place 1 drop into both eyes 3 (three) times daily as needed (dry eyes).    . Probiotic Product (ALIGN) 4 MG CAPS Take 1 capsule (4 mg total) by mouth daily. 30 capsule 0   No facility-administered medications prior to visit.    ROS: Review of Systems  Objective:  BP (!) 148/88 (BP Location: Left Arm, Patient Position: Sitting, Cuff Size: Normal)   Pulse 68   Temp 98 F (36.7 C) (Oral)   Ht 5\' 2"  (1.575 m)   Wt  130 lb 9.6 oz (59.2 kg)   SpO2 96%   BMI 23.89 kg/m   BP Readings from Last 3 Encounters:  07/29/19 (!) 148/88  07/12/19 (!) 152/86  06/16/19 126/74    Wt Readings from Last 3 Encounters:  07/29/19 130 lb 9.6 oz (59.2 kg)  07/12/19 128 lb (58.1 kg)  06/16/19 134 lb 6.4 oz (61 kg)    Physical Exam  Lab Results  Component Value Date   WBC 9.2 07/12/2019   HGB 12.7 07/12/2019   HCT 38.6 07/12/2019   PLT 425.0 (H) 07/12/2019   GLUCOSE 119 (H) 07/12/2019   CHOL 208 (H) 11/03/2018   TRIG 176.0 (H) 11/03/2018   HDL 55.80 11/03/2018   LDLCALC 117 (H) 11/03/2018   ALT 7 07/12/2019   AST 12 07/12/2019   NA 135 07/12/2019   K 4.6 07/12/2019   CL 103 07/12/2019   CREATININE 1.68 (H) 07/12/2019   BUN 34 (H) 07/12/2019   CO2 23 07/12/2019   TSH 2.37 07/12/2019   INR 1.3 (H) 05/11/2019   HGBA1C 6.0 (H) 11/03/2014    No results found.  Assessment & Plan:    Follow-up: No follow-ups on file.  Walker Kehr, MD

## 2019-07-29 NOTE — Assessment & Plan Note (Signed)
Not better Repeat UA, labs, [name]

## 2019-08-01 DIAGNOSIS — H20022 Recurrent acute iridocyclitis, left eye: Secondary | ICD-10-CM | POA: Diagnosis not present

## 2019-08-01 DIAGNOSIS — H5310 Unspecified subjective visual disturbances: Secondary | ICD-10-CM | POA: Diagnosis not present

## 2019-08-01 DIAGNOSIS — Z961 Presence of intraocular lens: Secondary | ICD-10-CM | POA: Diagnosis not present

## 2019-08-02 DIAGNOSIS — H20022 Recurrent acute iridocyclitis, left eye: Secondary | ICD-10-CM | POA: Diagnosis not present

## 2019-08-02 DIAGNOSIS — E790 Hyperuricemia without signs of inflammatory arthritis and tophaceous disease: Secondary | ICD-10-CM | POA: Diagnosis not present

## 2019-08-02 DIAGNOSIS — N281 Cyst of kidney, acquired: Secondary | ICD-10-CM | POA: Diagnosis not present

## 2019-08-02 DIAGNOSIS — Z961 Presence of intraocular lens: Secondary | ICD-10-CM | POA: Diagnosis not present

## 2019-08-02 DIAGNOSIS — N202 Calculus of kidney with calculus of ureter: Secondary | ICD-10-CM | POA: Diagnosis not present

## 2019-08-02 DIAGNOSIS — R34 Anuria and oliguria: Secondary | ICD-10-CM | POA: Diagnosis not present

## 2019-08-03 ENCOUNTER — Ambulatory Visit
Admission: RE | Admit: 2019-08-03 | Discharge: 2019-08-03 | Disposition: A | Discharge status: Home / Self Care | Payer: Medicare Other | Source: Ambulatory Visit | Attending: Internal Medicine | Admitting: Internal Medicine

## 2019-08-03 ENCOUNTER — Telehealth: Payer: Self-pay | Admitting: Internal Medicine

## 2019-08-03 DIAGNOSIS — K8689 Other specified diseases of pancreas: Secondary | ICD-10-CM

## 2019-08-03 DIAGNOSIS — N281 Cyst of kidney, acquired: Secondary | ICD-10-CM

## 2019-08-03 DIAGNOSIS — K8020 Calculus of gallbladder without cholecystitis without obstruction: Secondary | ICD-10-CM | POA: Diagnosis not present

## 2019-08-03 DIAGNOSIS — R1011 Right upper quadrant pain: Secondary | ICD-10-CM

## 2019-08-03 MED ORDER — AMOXICILLIN-POT CLAVULANATE 875-125 MG PO TABS: 1.0000 | ORAL_TABLET | Freq: Two times a day (BID) | ORAL | 1 refills | Status: DC

## 2019-08-03 NOTE — Telephone Encounter (Signed)
Kristen Castillo and [name] were informed of [name]; she is having the same sx's --   US IMPRESSION: Cholelithiasis with gallbladder wall thickening and potential sonographic Murphy sign suspicious for acute cholecystitis.  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  Recommend characterization of these lesions by MR imaging, with/without contrast if patient's renal function permits, otherwise without contrast.  Mild pancreatic ductal dilatation and inhomogeneity of the pancreatic head not identified on prior CT though the prior CT was a noncontrast study, cannot exclude pancreatic head mass; recommend this be evaluated simultaneously at time of MR.  These results will be called to the ordering clinician or representative by the Radiologist Assistant, and communication documented in the PACS or [employer].   Electronically Signed   By: Lavonia Dana M.D.   On: 08/03/2019 09:58  Surg ref Augmentin po To ER if worse Abd MRI

## 2019-08-06 DIAGNOSIS — R109 Unspecified abdominal pain: Secondary | ICD-10-CM | POA: Diagnosis not present

## 2019-08-10 ENCOUNTER — Encounter: Payer: Self-pay | Admitting: Family

## 2019-08-10 ENCOUNTER — Other Ambulatory Visit: Payer: Self-pay | Admitting: Surgery

## 2019-08-10 ENCOUNTER — Ambulatory Visit: Payer: Medicare Other | Admitting: Internal Medicine

## 2019-08-10 ENCOUNTER — Other Ambulatory Visit (HOSPITAL_COMMUNITY): Payer: Self-pay | Admitting: Surgery

## 2019-08-10 DIAGNOSIS — R109 Unspecified abdominal pain: Secondary | ICD-10-CM

## 2019-08-11 ENCOUNTER — Ambulatory Visit
Admission: RE | Admit: 2019-08-11 | Discharge: 2019-08-11 | Disposition: A | Discharge status: Home / Self Care | Payer: Medicare Other | Source: Ambulatory Visit | Attending: Internal Medicine | Admitting: Internal Medicine

## 2019-08-11 ENCOUNTER — Other Ambulatory Visit: Payer: Self-pay

## 2019-08-11 DIAGNOSIS — K8689 Other specified diseases of pancreas: Secondary | ICD-10-CM | POA: Diagnosis not present

## 2019-08-11 DIAGNOSIS — N281 Cyst of kidney, acquired: Secondary | ICD-10-CM

## 2019-08-11 DIAGNOSIS — R1011 Right upper quadrant pain: Secondary | ICD-10-CM

## 2019-08-11 DIAGNOSIS — H20022 Recurrent acute iridocyclitis, left eye: Secondary | ICD-10-CM | POA: Diagnosis not present

## 2019-08-11 MED ORDER — GADOBENATE DIMEGLUMINE 529 MG/ML IV SOLN
12.0000 mL | Freq: Once | INTRAVENOUS | Status: AC | PRN
  Administered 2019-08-11: 12 mL via INTRAVENOUS

## 2019-08-14 ENCOUNTER — Other Ambulatory Visit: Payer: Self-pay | Admitting: Internal Medicine

## 2019-08-14 DIAGNOSIS — Z7189 Other specified counseling: Secondary | ICD-10-CM

## 2019-08-14 DIAGNOSIS — R059 Cough, unspecified: Secondary | ICD-10-CM

## 2019-08-17 ENCOUNTER — Other Ambulatory Visit: Payer: Self-pay

## 2019-08-17 ENCOUNTER — Encounter (HOSPITAL_COMMUNITY)
Admission: RE | Admit: 2019-08-17 | Discharge: 2019-08-17 | Disposition: A | Discharge status: Home / Self Care | Payer: Medicare Other | Source: Ambulatory Visit | Attending: Surgery | Admitting: Surgery

## 2019-08-17 DIAGNOSIS — R109 Unspecified abdominal pain: Secondary | ICD-10-CM

## 2019-08-17 MED ORDER — TECHNETIUM TC 99M MEBROFENIN IV KIT
5.0000 | PACK | Freq: Once | INTRAVENOUS | Status: AC | PRN
  Administered 2019-08-17: 5 via INTRAVENOUS

## 2019-08-24 ENCOUNTER — Ambulatory Visit: Payer: Medicare Other | Admitting: Internal Medicine

## 2019-08-26 ENCOUNTER — Ambulatory Visit: Payer: Medicare Other | Admitting: Internal Medicine

## 2019-08-26 ENCOUNTER — Other Ambulatory Visit: Payer: Self-pay

## 2019-08-26 ENCOUNTER — Encounter: Payer: Self-pay | Admitting: Internal Medicine

## 2019-08-26 VITALS — BP 164/92 | HR 91 | Temp 97.7°F | Ht 62.0 in | Wt 131.0 lb

## 2019-08-26 DIAGNOSIS — R1011 Right upper quadrant pain: Secondary | ICD-10-CM

## 2019-08-26 DIAGNOSIS — R319 Hematuria, unspecified: Secondary | ICD-10-CM | POA: Insufficient documentation

## 2019-08-26 DIAGNOSIS — K625 Hemorrhage of anus and rectum: Secondary | ICD-10-CM | POA: Diagnosis not present

## 2019-08-26 DIAGNOSIS — Z23 Encounter for immunization: Secondary | ICD-10-CM | POA: Diagnosis not present

## 2019-08-26 LAB — URINALYSIS, ROUTINE W REFLEX MICROSCOPIC
Nitrite: POSITIVE — AB
Specific Gravity, Urine: 1.02 (ref 1.000–1.030)
Total Protein, Urine: >=300 — AB
Urine Glucose: NEGATIVE
Urobilinogen, UA: 1 (ref 0.0–1.0)
pH: 6.5 (ref 5.0–8.0)

## 2019-08-26 LAB — CBC WITH DIFFERENTIAL/PLATELET
Basophils Absolute: 0.1 10*3/uL (ref 0.0–0.1)
Basophils Relative: 0.9 % (ref 0.0–3.0)
Eosinophils Absolute: 0.2 10*3/uL (ref 0.0–0.7)
Eosinophils Relative: 2.4 % (ref 0.0–5.0)
HCT: 38.2 % (ref 36.0–46.0)
Hemoglobin: 12.6 g/dL (ref 12.0–15.0)
Lymphocytes Relative: 17.5 % (ref 12.0–46.0)
Lymphs Abs: 1.3 10*3/uL (ref 0.7–4.0)
MCHC: 32.9 g/dL (ref 30.0–36.0)
MCV: 89 fl (ref 78.0–100.0)
Monocytes Absolute: 0.5 10*3/uL (ref 0.1–1.0)
Monocytes Relative: 6.5 % (ref 3.0–12.0)
Neutro Abs: 5.4 10*3/uL (ref 1.4–7.7)
Neutrophils Relative %: 72.7 % (ref 43.0–77.0)
Platelets: 308 10*3/uL (ref 150.0–400.0)
RBC: 4.3 Mil/uL (ref 3.87–5.11)
RDW: 15.1 % (ref 11.5–15.5)
WBC: 7.4 10*3/uL (ref 4.0–10.5)

## 2019-08-26 LAB — BASIC METABOLIC PANEL
BUN: 41 mg/dL — ABNORMAL HIGH (ref 6–23)
CO2: 23 mEq/L (ref 19–32)
Calcium: 9.4 mg/dL (ref 8.4–10.5)
Chloride: 106 mEq/L (ref 96–112)
Creatinine, Ser: 1.19 mg/dL (ref 0.40–1.20)
GFR: 43.79 mL/min — ABNORMAL LOW (ref >60.00)
Glucose, Bld: 111 mg/dL — ABNORMAL HIGH (ref 70–99)
Potassium: 3.7 mEq/L (ref 3.5–5.1)
Sodium: 137 mEq/L (ref 135–145)

## 2019-08-26 LAB — SEDIMENTATION RATE: Sed Rate: 21 mm/hr (ref 0–30)

## 2019-08-26 LAB — HCG, QUANTITATIVE, PREGNANCY: Quantitative HCG: 7.45 m[IU]/mL

## 2019-08-26 MED ORDER — CIPROFLOXACIN HCL 250 MG PO TABS
250.0000 mg | ORAL_TABLET | Freq: Two times a day (BID) | ORAL | 1 refills | Status: DC
Start: 2019-08-26 — End: 2019-12-10

## 2019-08-26 NOTE — Assessment & Plan Note (Signed)
Acute - new. Passed a stone vs hemorragic cystitis. Cipro x 3 d. Urine Cx. Urol f/u w/Dr Tresa Moore

## 2019-08-26 NOTE — Progress Notes (Signed)
Subjective:  Patient ID: Kristen Castillo, female    DOB: 11-21-1940  Age: 79 y.o. MRN: [id number]  CC: No chief complaint on file.   HPI Kristen Castillo presents for straining this am having a BM - "something popped", no pain - blood on paper. Colon 2016 - Dr Hilarie Fredrickson. GYN appt  - pending. Kristen Castillo noticed bloody urine here in the office after her exam.  Outpatient Medications Prior to Visit  Medication Sig Dispense Refill  . amoxicillin-clavulanate (AUGMENTIN) 875-125 MG tablet Take 1 tablet by mouth 2 (two) times daily. 20 tablet 1  . apixaban (ELIQUIS) 2.5 MG TABS tablet Take 1 tablet (2.5 mg total) by mouth 2 (two) times daily. MUST HAVE OV FOR FURTHER REFILLS 60 tablet 12  . benzonatate (TESSALON) 100 MG capsule TAKE  (1)  CAPSULE THREE TIMES DAILY AS NEEDED. 60 capsule 0  . Bromfenac Sodium (PROLENSA) 0.07 % SOLN Place 1 drop into the left eye daily.    . budesonide (RHINOCORT ALLERGY) 32 MCG/ACT nasal spray Place 1 spray into both nostrils in the morning and at bedtime.     . calcium carbonate (OS-CAL) 600 MG TABS tablet Take 600 mg by mouth daily.     . Cholecalciferol (VITAMIN D3) 1.25 MG (50000 UT) CAPS Take 1 capsule by mouth every 30 (thirty) days. 3 capsule 3  . denosumab (PROLIA) 60 MG/ML SOLN injection Inject 60 mg into the skin every 6 (six) months. Administer in upper arm, thigh, or abdomen    . famotidine (PEPCID) 20 MG tablet Take 20 mg by mouth at bedtime.     Marland Kitchen LORazepam (ATIVAN) 0.5 MG tablet Take 1 tablet (0.5 mg total) by mouth daily as needed. 30 tablet 1  . Multiple Vitamin (MULTIVITAMIN) tablet Take 1 tablet by mouth at bedtime.     Vladimir Faster Glycol-Propyl Glycol (SYSTANE OP) Place 1 drop into both eyes 3 (three) times daily as needed (dry eyes).    . prednisoLONE acetate (PRED FORTE) 1 % ophthalmic suspension     . Probiotic Product (ALIGN) 4 MG CAPS Take 1 capsule (4 mg total) by mouth daily. 30 capsule 0   No facility-administered medications prior to  visit.    ROS: Review of Systems  Constitutional: Negative for activity change, appetite change, chills, fatigue, fever and unexpected weight change.  HENT: Negative for congestion, mouth sores and sinus pressure.   Eyes: Negative for visual disturbance.  Respiratory: Negative for cough and chest tightness.   Gastrointestinal: Positive for anal bleeding. Negative for abdominal pain, nausea and rectal pain.  Genitourinary: Positive for hematuria. Negative for decreased urine volume, difficulty urinating, frequency, pelvic pain, urgency, vaginal bleeding and vaginal pain.  Musculoskeletal: Negative for back pain and gait problem.  Skin: Negative for pallor and rash.  Neurological: Negative for dizziness, tremors, weakness, numbness and headaches.  Psychiatric/Behavioral: Negative for confusion, sleep disturbance and suicidal ideas.    Objective:  BP (!) 164/92 (BP Location: Right Arm, Patient Position: Sitting, Cuff Size: Normal)   Pulse 91   Temp 97.7 F (36.5 C) (Oral)   Ht 5\' 2"  (1.575 m)   Wt 131 lb (59.4 kg)   SpO2 97%   BMI 23.96 kg/m   BP Readings from Last 3 Encounters:  08/26/19 (!) 164/92  07/29/19 (!) 148/88  07/12/19 (!) 152/86    Wt Readings from Last 3 Encounters:  08/26/19 131 lb (59.4 kg)  07/29/19 130 lb 9.6 oz (59.2 kg)  07/12/19 128 lb (58.1 kg)  Physical Exam Constitutional:      General: She is not in acute distress.    Appearance: She is well-developed.  HENT:     Head: Normocephalic.     Right Ear: External ear normal.     Left Ear: External ear normal.     Nose: Nose normal.  Eyes:     General:        Right eye: No discharge.        Left eye: No discharge.     Conjunctiva/sclera: Conjunctivae normal.     Pupils: Pupils are equal, round, and reactive to light.  Neck:     Thyroid: No thyromegaly.     Vascular: No JVD.     Trachea: No tracheal deviation.  Cardiovascular:     Rate and Rhythm: Normal rate and regular rhythm.     Heart  sounds: Normal heart sounds.  Pulmonary:     Effort: No respiratory distress.     Breath sounds: No stridor. No wheezing.  Abdominal:     General: Bowel sounds are normal. There is no distension.     Palpations: Abdomen is soft. There is no mass.     Tenderness: There is no abdominal tenderness. There is no guarding or rebound.  Genitourinary:    Vagina: No vaginal discharge.     Rectum: Guaiac result negative.  Musculoskeletal:        General: No tenderness.     Cervical back: Normal range of motion and neck supple.  Lymphadenopathy:     Cervical: No cervical adenopathy.  Skin:    Findings: No erythema or rash.  Neurological:     Cranial Nerves: No cranial nerve deficit.     Motor: No abnormal muscle tone.     Coordination: Coordination normal.     Deep Tendon Reflexes: Reflexes normal.  Psychiatric:        Behavior: Behavior normal.        Thought Content: Thought content normal.        Judgment: Judgment normal.    Urethra - WNL Vagina w/atrophic mucosa Rectal - no mass. Guaiac (-) stool  Procedure: Anoscopy Indication: bleeding Risks and benefits were explained to pt in detail. He was placed in lateral decubitus position. Digital rectal exam was normal  . Stool was guaiac negative. . Anoscope was introduced without difficulty. No masses. Upon withdrawal normal mucosa was observed. There was a number of small internal hemorrhoids.. Impression: Internal non-bleeding hemorrhoids. No fissure. Tolerated well. Complications - none.  Lab Results  Component Value Date   WBC 9.2 07/12/2019   HGB 12.7 07/12/2019   HCT 38.6 07/12/2019   PLT 425.0 (H) 07/12/2019   GLUCOSE 117 (H) 07/29/2019   CHOL 208 (H) 11/03/2018   TRIG 176.0 (H) 11/03/2018   HDL 55.80 11/03/2018   LDLCALC 117 (H) 11/03/2018   ALT 11 07/29/2019   AST 15 07/29/2019   NA 137 07/29/2019   K 4.4 07/29/2019   CL 104 07/29/2019   CREATININE 1.18 07/29/2019   BUN 34 (H) 07/29/2019   CO2 25 07/29/2019    TSH 2.37 07/12/2019   INR 1.3 (H) 05/11/2019   HGBA1C 6.0 (H) 11/03/2014    NM Hepato W/EF  Result Date: 08/18/2019 CLINICAL DATA:  Upper abdominal pain EXAM: NUCLEAR MEDICINE HEPATOBILIARY IMAGING WITH GALLBLADDER EF VIEWS: Anterior right upper quadrant RADIOPHARMACEUTICALS:  5.1 mCi Tc-76m  Choletec IV COMPARISON:  None. FINDINGS: Liver uptake of radiotracer is unremarkable. There is prompt visualization of gallbladder and small  bowel, indicating patency of the cystic and common bile ducts. The patient consumed 8 ounces of Ensure orally calculation of the computer generated ejection fraction of radiotracer from the gallbladder. Patient did not experience clinical symptoms with the oral Ensure consumption. The computer generated ejection fraction of radiotracer from the gallbladder is normal at 62%, normal greater than 33% using the oral agent. IMPRESSION: Study within normal limits. Electronically Signed   By: Lowella Grip III M.D.   On: 08/18/2019 09:43    Assessment & Plan:    Walker Kehr, MD

## 2019-08-30 ENCOUNTER — Telehealth: Payer: Self-pay | Admitting: Emergency Medicine

## 2019-08-30 NOTE — Telephone Encounter (Signed)
She can definitely stop the Eliquis short-term for procedure, like cystoscopy etc.  It may be acceptable for her to stop long-term if we felt this was necessary, the benefits outweigh the risk associated with being off Eliquis (that is, recurrent pulmonary embolism).  I would like to see with the urologist say, believe is behind the blood before we decide for sure about the Eliquis long-term

## 2019-08-30 NOTE — Telephone Encounter (Signed)
Spoke with pt. States that she has been having issues with blood in her urine. Pt has an appointment with a urologist tomorrow and they advised her to reach out to [name] and see if Dr. Lamonte Sakai thinks it would be okay for her to stop Eliquis if the Urologist advises that.  Dr. Lamonte Sakai - please advise. Thanks.

## 2019-08-31 ENCOUNTER — Telehealth: Payer: Self-pay | Admitting: Emergency Medicine

## 2019-08-31 DIAGNOSIS — N281 Cyst of kidney, acquired: Secondary | ICD-10-CM | POA: Diagnosis not present

## 2019-08-31 DIAGNOSIS — R310 Gross hematuria: Secondary | ICD-10-CM | POA: Diagnosis not present

## 2019-08-31 DIAGNOSIS — N200 Calculus of kidney: Secondary | ICD-10-CM | POA: Diagnosis not present

## 2019-08-31 NOTE — Telephone Encounter (Signed)
Thank you :)

## 2019-08-31 NOTE — Telephone Encounter (Signed)
Called and spoke with pt who stated her urologist did not want to take her off of the eliquis yet. She stated that he wants to do another ultrasound and other tests next week before he decides.  Pt is set up for another ultrasound next week. She stated after she has the tests, she will call us to let her know the outcome and decision from urologist. Routing to Dr. Lamonte Sakai as an Juluis Rainier.

## 2019-08-31 NOTE — Telephone Encounter (Signed)
Patient is returning phone call. Patient phone number is [phone].

## 2019-08-31 NOTE — Telephone Encounter (Signed)
Spoke with pt. She is aware of RB's response. Nothing further was needed. 

## 2019-08-31 NOTE — Telephone Encounter (Signed)
Patient checking to see if she needs to stop Eliquis for procedure. Patient has an appt at 9:30 this morning. Patient phone number is [phone].

## 2019-08-31 NOTE — Telephone Encounter (Signed)
Called and left message for patient to return call.  

## 2019-09-06 ENCOUNTER — Other Ambulatory Visit: Payer: Medicare Other

## 2019-09-07 ENCOUNTER — Telehealth: Payer: Self-pay | Admitting: Emergency Medicine

## 2019-09-07 DIAGNOSIS — N281 Cyst of kidney, acquired: Secondary | ICD-10-CM | POA: Diagnosis not present

## 2019-09-07 DIAGNOSIS — R82998 Other abnormal findings in urine: Secondary | ICD-10-CM | POA: Diagnosis not present

## 2019-09-07 DIAGNOSIS — N202 Calculus of kidney with calculus of ureter: Secondary | ICD-10-CM | POA: Diagnosis not present

## 2019-09-07 NOTE — Telephone Encounter (Signed)
Spoke with the pt  She is calling to let Dr Lamonte Sakai know that she was seen by her urologist and for now the plan is to continue her Eliquis  FYI

## 2019-09-07 NOTE — Telephone Encounter (Signed)
Patient is returning phone call. Patient phone number is [phone].

## 2019-09-07 NOTE — Telephone Encounter (Signed)
LMTCB x1 for pt.  

## 2019-09-08 DIAGNOSIS — L821 Other seborrheic keratosis: Secondary | ICD-10-CM | POA: Diagnosis not present

## 2019-09-08 DIAGNOSIS — L820 Inflamed seborrheic keratosis: Secondary | ICD-10-CM | POA: Diagnosis not present

## 2019-09-08 DIAGNOSIS — Z85828 Personal history of other malignant neoplasm of skin: Secondary | ICD-10-CM | POA: Diagnosis not present

## 2019-09-08 NOTE — Telephone Encounter (Signed)
Ok thanks for letting me know

## 2019-09-13 ENCOUNTER — Other Ambulatory Visit: Payer: Self-pay | Admitting: Internal Medicine

## 2019-09-13 DIAGNOSIS — R059 Cough, unspecified: Secondary | ICD-10-CM

## 2019-09-13 DIAGNOSIS — Z7189 Other specified counseling: Secondary | ICD-10-CM

## 2019-09-17 DIAGNOSIS — H20022 Recurrent acute iridocyclitis, left eye: Secondary | ICD-10-CM | POA: Diagnosis not present

## 2019-09-18 DIAGNOSIS — H2102 Hyphema, left eye: Secondary | ICD-10-CM | POA: Diagnosis not present

## 2019-09-18 DIAGNOSIS — H20022 Recurrent acute iridocyclitis, left eye: Secondary | ICD-10-CM | POA: Diagnosis not present

## 2019-09-20 DIAGNOSIS — H2022 Lens-induced iridocyclitis, left eye: Secondary | ICD-10-CM | POA: Diagnosis not present

## 2019-09-20 DIAGNOSIS — N200 Calculus of kidney: Secondary | ICD-10-CM | POA: Diagnosis not present

## 2019-09-24 DIAGNOSIS — H20022 Recurrent acute iridocyclitis, left eye: Secondary | ICD-10-CM | POA: Diagnosis not present

## 2019-09-27 DIAGNOSIS — H20022 Recurrent acute iridocyclitis, left eye: Secondary | ICD-10-CM | POA: Diagnosis not present

## 2019-09-30 DIAGNOSIS — H35373 Puckering of macula, bilateral: Secondary | ICD-10-CM | POA: Diagnosis not present

## 2019-09-30 DIAGNOSIS — H3540 Unspecified peripheral retinal degeneration: Secondary | ICD-10-CM | POA: Diagnosis not present

## 2019-09-30 DIAGNOSIS — T8521XA Breakdown (mechanical) of intraocular lens, initial encounter: Secondary | ICD-10-CM | POA: Diagnosis not present

## 2019-09-30 DIAGNOSIS — H43813 Vitreous degeneration, bilateral: Secondary | ICD-10-CM | POA: Diagnosis not present

## 2019-10-04 ENCOUNTER — Telehealth: Payer: Self-pay | Admitting: Emergency Medicine

## 2019-10-04 DIAGNOSIS — H20022 Recurrent acute iridocyclitis, left eye: Secondary | ICD-10-CM | POA: Diagnosis not present

## 2019-10-04 NOTE — Telephone Encounter (Signed)
Patient would like [name] to leave a detailed message with this information if she doesn't answer -pr

## 2019-10-04 NOTE — Telephone Encounter (Signed)
Yes it is absolutely ok for her to hold Eliquis 2 days prior to the procedure, and then to restart it when instructed by her surgeon.

## 2019-10-04 NOTE — Telephone Encounter (Signed)
Let patient know once we heard back from Dr. Lamonte Sakai we would let her know his recommendations.    (pt having retina surgery on 10/27/2019 - surgeon would like her stop Eliquis 2 days prior to and needs RB's permission to do this )   Dr. Lamonte Sakai please advise on medication

## 2019-10-04 NOTE — Telephone Encounter (Signed)
Called and spoke with Patient.  Dr. Byrum's recommendations given.  Understanding stated.  Nothing further at this time. 

## 2019-10-07 ENCOUNTER — Ambulatory Visit: Payer: Medicare Other | Admitting: Internal Medicine

## 2019-10-07 ENCOUNTER — Encounter: Payer: Self-pay | Admitting: Internal Medicine

## 2019-10-07 ENCOUNTER — Other Ambulatory Visit: Payer: Self-pay

## 2019-10-07 DIAGNOSIS — N200 Calculus of kidney: Secondary | ICD-10-CM

## 2019-10-07 DIAGNOSIS — I2692 Saddle embolus of pulmonary artery without acute cor pulmonale: Secondary | ICD-10-CM | POA: Diagnosis not present

## 2019-10-07 DIAGNOSIS — R1084 Generalized abdominal pain: Secondary | ICD-10-CM | POA: Diagnosis not present

## 2019-10-07 DIAGNOSIS — E559 Vitamin D deficiency, unspecified: Secondary | ICD-10-CM | POA: Diagnosis not present

## 2019-10-07 NOTE — Assessment & Plan Note (Addendum)
Resolved  Repeat abd MRI in 1 or 2 years

## 2019-10-07 NOTE — Progress Notes (Signed)
Subjective:  Patient ID: Kristen Castillo, female    DOB: 13-Feb-1941  Age: 79 y.o. MRN: [id number]  CC: No chief complaint on file.   HPI Kristen Castillo presents for hematuria - passed a stone, abd pain - resolved F/u abd MRI  Outpatient Medications Prior to Visit  Medication Sig Dispense Refill  . amoxicillin-clavulanate (AUGMENTIN) 875-125 MG tablet Take 1 tablet by mouth 2 (two) times daily. 20 tablet 1  . apixaban (ELIQUIS) 2.5 MG TABS tablet Take 1 tablet (2.5 mg total) by mouth 2 (two) times daily. MUST HAVE OV FOR FURTHER REFILLS 60 tablet 12  . benzonatate (TESSALON) 100 MG capsule TAKE  (1)  CAPSULE THREE TIMES DAILY AS NEEDED. 60 capsule 0  . Bromfenac Sodium (PROLENSA) 0.07 % SOLN Place 1 drop into the left eye daily.    . budesonide (RHINOCORT ALLERGY) 32 MCG/ACT nasal spray Place 1 spray into both nostrils in the morning and at bedtime.     . calcium carbonate (OS-CAL) 600 MG TABS tablet Take 600 mg by mouth daily.     . Cholecalciferol (VITAMIN D3) 1.25 MG (50000 UT) CAPS Take 1 capsule by mouth every 30 (thirty) days. 3 capsule 3  . ciprofloxacin (CIPRO) 250 MG tablet Take 1 tablet (250 mg total) by mouth 2 (two) times daily. 6 tablet 1  . denosumab (PROLIA) 60 MG/ML SOLN injection Inject 60 mg into the skin every 6 (six) months. Administer in upper arm, thigh, or abdomen    . famotidine (PEPCID) 20 MG tablet Take 20 mg by mouth at bedtime.     Marland Kitchen LORazepam (ATIVAN) 0.5 MG tablet Take 1 tablet (0.5 mg total) by mouth daily as needed. 30 tablet 1  . Multiple Vitamin (MULTIVITAMIN) tablet Take 1 tablet by mouth at bedtime.     Vladimir Faster Glycol-Propyl Glycol (SYSTANE OP) Place 1 drop into both eyes 3 (three) times daily as needed (dry eyes).    . prednisoLONE acetate (PRED FORTE) 1 % ophthalmic suspension     . Probiotic Product (ALIGN) 4 MG CAPS Take 1 capsule (4 mg total) by mouth daily. 30 capsule 0   No facility-administered medications prior to visit.     ROS: Review of Systems  Constitutional: Negative for activity change, appetite change, chills, fatigue and unexpected weight change.  HENT: Negative for congestion, mouth sores and sinus pressure.   Eyes: Positive for visual disturbance.  Respiratory: Negative for cough and chest tightness.   Gastrointestinal: Negative for abdominal pain and nausea.  Genitourinary: Negative for difficulty urinating, frequency and vaginal pain.  Musculoskeletal: Negative for back pain and gait problem.  Skin: Negative for pallor and rash.  Neurological: Negative for dizziness, tremors, weakness, numbness and headaches.  Psychiatric/Behavioral: Negative for confusion and sleep disturbance.    Objective:  BP (!) 130/82 (BP Location: Right Arm, Patient Position: Sitting, Cuff Size: Normal)   Pulse 78   Temp 97.7 F (36.5 C) (Oral)   Ht 5\' 2"  (1.575 m)   Wt 129 lb (58.5 kg)   SpO2 96%   BMI 23.59 kg/m   BP Readings from Last 3 Encounters:  10/07/19 (!) 130/82  08/26/19 (!) 164/92  07/29/19 (!) 148/88    Wt Readings from Last 3 Encounters:  10/07/19 129 lb (58.5 kg)  08/26/19 131 lb (59.4 kg)  07/29/19 130 lb 9.6 oz (59.2 kg)    Physical Exam Constitutional:      General: She is not in acute distress.    Appearance: She  is well-developed.  HENT:     Head: Normocephalic.     Right Ear: External ear normal.     Left Ear: External ear normal.     Nose: Nose normal.  Eyes:     General:        Right eye: No discharge.        Left eye: No discharge.     Conjunctiva/sclera: Conjunctivae normal.     Pupils: Pupils are equal, round, and reactive to light.  Neck:     Thyroid: No thyromegaly.     Vascular: No JVD.     Trachea: No tracheal deviation.  Cardiovascular:     Rate and Rhythm: Normal rate and regular rhythm.     Heart sounds: Normal heart sounds.  Pulmonary:     Effort: No respiratory distress.     Breath sounds: No stridor. No wheezing.  Abdominal:     General: Bowel  sounds are normal. There is no distension.     Palpations: Abdomen is soft. There is no mass.     Tenderness: There is no abdominal tenderness. There is no guarding or rebound.  Musculoskeletal:        General: No tenderness.     Cervical back: Normal range of motion and neck supple.  Lymphadenopathy:     Cervical: No cervical adenopathy.  Skin:    Findings: No erythema or rash.  Neurological:     Mental Status: She is oriented to person, place, and time.     Cranial Nerves: No cranial nerve deficit.     Motor: No abnormal muscle tone.     Coordination: Coordination normal.     Deep Tendon Reflexes: Reflexes normal.  Psychiatric:        Behavior: Behavior normal.        Thought Content: Thought content normal.        Judgment: Judgment normal.     Lab Results  Component Value Date   WBC 7.4 08/26/2019   HGB 12.6 08/26/2019   HCT 38.2 08/26/2019   PLT 308.0 08/26/2019   GLUCOSE 111 (H) 08/26/2019   CHOL 208 (H) 11/03/2018   TRIG 176.0 (H) 11/03/2018   HDL 55.80 11/03/2018   LDLCALC 117 (H) 11/03/2018   ALT 11 07/29/2019   AST 15 07/29/2019   NA 137 08/26/2019   K 3.7 08/26/2019   CL 106 08/26/2019   CREATININE 1.19 08/26/2019   BUN 41 (H) 08/26/2019   CO2 23 08/26/2019   TSH 2.37 07/12/2019   INR 1.3 (H) 05/11/2019   HGBA1C 6.0 (H) 11/03/2014    NM Hepato W/EF  Result Date: 08/18/2019 CLINICAL DATA:  Upper abdominal pain EXAM: NUCLEAR MEDICINE HEPATOBILIARY IMAGING WITH GALLBLADDER EF VIEWS: Anterior right upper quadrant RADIOPHARMACEUTICALS:  5.1 mCi Tc-94m  Choletec IV COMPARISON:  None. FINDINGS: Liver uptake of radiotracer is unremarkable. There is prompt visualization of gallbladder and small bowel, indicating patency of the cystic and common bile ducts. The patient consumed 8 ounces of Ensure orally calculation of the computer generated ejection fraction of radiotracer from the gallbladder. Patient did not experience clinical symptoms with the oral Ensure  consumption. The computer generated ejection fraction of radiotracer from the gallbladder is normal at 62%, normal greater than 33% using the oral agent. IMPRESSION: Study within normal limits. Electronically Signed   By: Lowella Grip III M.D.   On: 08/18/2019 09:43    Assessment & Plan:   There are no diagnoses linked to this encounter.   No  orders of the defined types were placed in this encounter.    Follow-up: No follow-ups on file.  Walker Kehr, MD

## 2019-10-07 NOTE — Assessment & Plan Note (Signed)
On Eliquis

## 2019-10-07 NOTE — Assessment & Plan Note (Signed)
Uric acid stone 2021 - seeing a nutritionist

## 2019-10-07 NOTE — Assessment & Plan Note (Signed)
Vit D monthly

## 2019-10-13 DIAGNOSIS — H2000 Unspecified acute and subacute iridocyclitis: Secondary | ICD-10-CM | POA: Diagnosis not present

## 2019-10-16 DIAGNOSIS — H20022 Recurrent acute iridocyclitis, left eye: Secondary | ICD-10-CM | POA: Diagnosis not present

## 2019-10-21 ENCOUNTER — Telehealth: Payer: Self-pay | Admitting: *Deleted

## 2019-10-21 NOTE — Telephone Encounter (Signed)
Pt husband came in and stated wife will be needing her Proia inj soon. Inform husband will forward msg to [place] to [employer]. Once insurance has been verified she will give her a call and schedule appt.Marland KitchenJohny Chess

## 2019-10-22 DIAGNOSIS — H20022 Recurrent acute iridocyclitis, left eye: Secondary | ICD-10-CM | POA: Diagnosis not present

## 2019-10-27 DIAGNOSIS — H43812 Vitreous degeneration, left eye: Secondary | ICD-10-CM | POA: Diagnosis not present

## 2019-10-27 DIAGNOSIS — T8522XA Displacement of intraocular lens, initial encounter: Secondary | ICD-10-CM | POA: Diagnosis not present

## 2019-11-03 NOTE — Telephone Encounter (Signed)
Will verify insurance and contact patient to schedule.

## 2019-11-05 ENCOUNTER — Other Ambulatory Visit: Payer: Self-pay | Admitting: Internal Medicine

## 2019-11-09 ENCOUNTER — Telehealth: Payer: Self-pay | Admitting: Internal Medicine

## 2019-11-09 NOTE — Telephone Encounter (Signed)
denosumab (PROLIA) 60 MG/ML SOLN injection  [employer] called requesting clinicals for the above script...Marland Kitchen please fax to: [phone]  [place]

## 2019-11-10 NOTE — Telephone Encounter (Signed)
[employer] called again following up on notes needed to complete prior authorization

## 2019-11-11 NOTE — Telephone Encounter (Signed)
Additional information faxed to [facility].

## 2019-11-18 DIAGNOSIS — L738 Other specified follicular disorders: Secondary | ICD-10-CM | POA: Diagnosis not present

## 2019-11-18 DIAGNOSIS — Z85828 Personal history of other malignant neoplasm of skin: Secondary | ICD-10-CM | POA: Diagnosis not present

## 2019-12-01 DIAGNOSIS — H35352 Cystoid macular degeneration, left eye: Secondary | ICD-10-CM | POA: Diagnosis not present

## 2019-12-10 ENCOUNTER — Ambulatory Visit (INDEPENDENT_AMBULATORY_CARE_PROVIDER_SITE_OTHER): Payer: Medicare Other | Admitting: Obstetrics and Gynecology

## 2019-12-10 ENCOUNTER — Other Ambulatory Visit: Payer: Self-pay

## 2019-12-10 ENCOUNTER — Encounter: Payer: Self-pay | Admitting: Obstetrics and Gynecology

## 2019-12-10 VITALS — BP 118/74 | Ht 62.0 in | Wt 128.0 lb

## 2019-12-10 DIAGNOSIS — Z23 Encounter for immunization: Secondary | ICD-10-CM | POA: Diagnosis not present

## 2019-12-10 DIAGNOSIS — Z8759 Personal history of other complications of pregnancy, childbirth and the puerperium: Secondary | ICD-10-CM | POA: Diagnosis not present

## 2019-12-10 DIAGNOSIS — M810 Age-related osteoporosis without current pathological fracture: Secondary | ICD-10-CM

## 2019-12-10 DIAGNOSIS — Z01419 Encounter for gynecological examination (general) (routine) without abnormal findings: Secondary | ICD-10-CM

## 2019-12-10 LAB — HCG, QUANTITATIVE, PREGNANCY: HCG, Total, QN: 8 m[IU]/mL

## 2019-12-10 NOTE — Progress Notes (Signed)
Kristen Castillo 07/22/1940 [id number]  SUBJECTIVE:  79 y.o. G1P0010 female here for a breast and pelvic exam. She notes some transient clearish discharge at night only.  Questions if she is having any pelvic organ prolapse.  She has no gynecologic concerns.  Current Outpatient Medications  Medication Sig Dispense Refill  . apixaban (ELIQUIS) 2.5 MG TABS tablet Take 1 tablet (2.5 mg total) by mouth 2 (two) times daily. MUST HAVE OV FOR FURTHER REFILLS 60 tablet 12  . benzonatate (TESSALON) 100 MG capsule TAKE  (1)  CAPSULE THREE TIMES DAILY AS NEEDED. 60 capsule 0  . Bromfenac Sodium (PROLENSA) 0.07 % SOLN Place 1 drop into the left eye daily.    . budesonide (RHINOCORT ALLERGY) 32 MCG/ACT nasal spray Place 1 spray into both nostrils in the morning and at bedtime.     . calcium carbonate (OS-CAL) 600 MG TABS tablet Take 600 mg by mouth daily.     . Cholecalciferol (VITAMIN D3) 1.25 MG (50000 UT) CAPS Take 1 capsule by mouth every 30 (thirty) days. 3 capsule 3  . denosumab (PROLIA) 60 MG/ML SOLN injection Inject 60 mg into the skin every 6 (six) months. Administer in upper arm, thigh, or abdomen    . famotidine (PEPCID) 20 MG tablet Take 20 mg by mouth at bedtime.     Marland Kitchen LORazepam (ATIVAN) 0.5 MG tablet TAKE 1 TABLET ONCE DAILY AS NEEDED FOR ANXIETY. 30 tablet 3  . Multiple Vitamin (MULTIVITAMIN) tablet Take 1 tablet by mouth at bedtime.     Vladimir Faster Glycol-Propyl Glycol (SYSTANE OP) Place 1 drop into both eyes 3 (three) times daily as needed (dry eyes).    . prednisoLONE acetate (PRED FORTE) 1 % ophthalmic suspension     . Probiotic Product (ALIGN) 4 MG CAPS Take 1 capsule (4 mg total) by mouth daily. 30 capsule 0   No current facility-administered medications for this visit.   Allergies: Codeine, Dilaudid [hydromorphone hcl], Sulfonamide derivatives, Tape, and Norco [hydrocodone-acetaminophen]  No LMP recorded. Patient has had a hysterectomy.  Past medical history,surgical history,  problem list, medications, allergies, family history and social history were all reviewed and documented as reviewed in the EPIC chart.  GYN ROS: no abnormal bleeding, pelvic pain, no breast pain or new or enlarging lumps on self exam.  No dysuria, frequency, burning, pain with urination, cloudy/malodorous urine.   OBJECTIVE:  BP 118/74   Ht 5\' 2"  (1.575 m)   Wt 128 lb (58.1 kg)   BMI 23.41 kg/m  The patient appears well, alert, oriented, in no distress.  BREAST EXAM: breasts appear normal, no suspicious masses, no skin or nipple changes or axillary nodes  PELVIC EXAM: VULVA: normal appearing vulva with atrophic change, no masses, tenderness or lesions, VAGINA: normal appearing vagina with atrophic change, normal color and no discharge present, no lesions, with speculum removed and patient in Valsalva, there is very mild cystocele, good vaginal cuff support, no rectocele, CERVIX: surgically absent, UTERUS: surgically absent, vaginal cuff normal, ADNEXA: no masses, nontender  Chaperone: [name] present during the examination  ASSESSMENT:  79 y.o. G1P0010 here for a breast and pelvic exam  PLAN:   1. Postmenopausal.  No significant hot flashes or night sweats.  No vaginal bleeding.  Prior TAH for molar pregnancy in 1972.  Subsequently had BSO due to ovarian cysts.  She is reassured today of minimal prolapse. 2.  History of molar pregnancy.  Following quantitative hCG annually and feels more comfortable doing this so  we will repeat the test today.  We explained the lab parameters and the different normal range cutoffs that are based on age and menopausal status. 3. Pap smear 2019.  Pap smear not performed today.  No significant history of abnormal Pap smears.  Should she desire to continue Pap smear screening we could repeat this next year and will readdress at that time. 4. Mammogram 02/2019.  Normal breast exam today.  She will continue with annual mammograms.. 5. Colonoscopy when he  2.  She will follow up at the interval recommended by her GI specialist.   6.  Osteoporosis.  DEXA 03/2018.  She is actively followed by Dr. Alain Marion and currently on Prolia.  She will continue to follow-up with him in regards to bone health.   7. Health maintenance.  No other routine screening labs today as she normally has these completed elsewhere.  Return annually or sooner, prn.  Joseph Pierini MD 12/10/19

## 2019-12-13 ENCOUNTER — Encounter: Payer: Self-pay | Admitting: Obstetrics and Gynecology

## 2019-12-17 ENCOUNTER — Ambulatory Visit (INDEPENDENT_AMBULATORY_CARE_PROVIDER_SITE_OTHER): Payer: Medicare Other | Admitting: *Deleted

## 2019-12-17 ENCOUNTER — Other Ambulatory Visit: Payer: Self-pay

## 2019-12-17 DIAGNOSIS — M810 Age-related osteoporosis without current pathological fracture: Secondary | ICD-10-CM

## 2019-12-17 MED ORDER — DENOSUMAB 60 MG/ML ~~LOC~~ SOSY
60.0000 mg | PREFILLED_SYRINGE | Freq: Once | SUBCUTANEOUS | Status: AC
  Administered 2019-12-17: 60 mg via SUBCUTANEOUS

## 2019-12-17 NOTE — Progress Notes (Signed)
Pls cosign for Prolia inj in absence of PCP...Johny Chess

## 2019-12-29 DIAGNOSIS — H35352 Cystoid macular degeneration, left eye: Secondary | ICD-10-CM | POA: Diagnosis not present

## 2019-12-29 DIAGNOSIS — H35373 Puckering of macula, bilateral: Secondary | ICD-10-CM | POA: Diagnosis not present

## 2020-01-07 DIAGNOSIS — G589 Mononeuropathy, unspecified: Secondary | ICD-10-CM | POA: Diagnosis not present

## 2020-01-10 ENCOUNTER — Ambulatory Visit: Payer: Medicare Other | Admitting: Internal Medicine

## 2020-01-10 ENCOUNTER — Other Ambulatory Visit: Payer: Self-pay | Admitting: Internal Medicine

## 2020-01-10 ENCOUNTER — Ambulatory Visit (INDEPENDENT_AMBULATORY_CARE_PROVIDER_SITE_OTHER)
Admission: RE | Admit: 2020-01-10 | Discharge: 2020-01-10 | Disposition: A | Discharge status: Home / Self Care | Payer: Medicare Other | Source: Ambulatory Visit | Attending: Internal Medicine | Admitting: Internal Medicine

## 2020-01-10 ENCOUNTER — Encounter: Payer: Self-pay | Admitting: Internal Medicine

## 2020-01-10 ENCOUNTER — Other Ambulatory Visit: Payer: Self-pay

## 2020-01-10 DIAGNOSIS — R202 Paresthesia of skin: Secondary | ICD-10-CM

## 2020-01-10 DIAGNOSIS — Z1231 Encounter for screening mammogram for malignant neoplasm of breast: Secondary | ICD-10-CM

## 2020-01-10 DIAGNOSIS — M47812 Spondylosis without myelopathy or radiculopathy, cervical region: Secondary | ICD-10-CM | POA: Diagnosis not present

## 2020-01-10 MED ORDER — METHYLPREDNISOLONE 4 MG PO TBPK: ORAL_TABLET | ORAL | 0 refills | Status: DC

## 2020-01-10 NOTE — Assessment & Plan Note (Addendum)
Likely a cervical radiculopathy on the right- will watch C-spine x-ray Medrol Dosepak

## 2020-01-10 NOTE — Patient Instructions (Signed)
Rice sock heating pad ? ?Contour pillow ?

## 2020-01-10 NOTE — Telephone Encounter (Signed)
Pt made appt for this afternoon for sxs.Marland KitchenJohny Castillo

## 2020-01-10 NOTE — Progress Notes (Signed)
Subjective:  Patient ID: Kristen Castillo, female    DOB: May 02, 1940  Age: 79 y.o. MRN: [id number]  CC: Arm Pain (Pt states more of a tingley feeling (R) arm. She states sometimes the feeling goes into her neck. Last night her feet was also itching & tingling)   HPI Soniyah Mcglory presents for  Tingling in the R arm C/o itching  Outpatient Medications Prior to Visit  Medication Sig Dispense Refill  . apixaban (ELIQUIS) 2.5 MG TABS tablet Take 1 tablet (2.5 mg total) by mouth 2 (two) times daily. MUST HAVE OV FOR FURTHER REFILLS 60 tablet 12  . benzonatate (TESSALON) 100 MG capsule TAKE  (1)  CAPSULE THREE TIMES DAILY AS NEEDED. 60 capsule 0  . Bromfenac Sodium (PROLENSA) 0.07 % SOLN Place 1 drop into the left eye daily.    . budesonide (RHINOCORT ALLERGY) 32 MCG/ACT nasal spray Place 1 spray into both nostrils in the morning and at bedtime.     . calcium carbonate (OS-CAL) 600 MG TABS tablet Take 600 mg by mouth daily.     . Cholecalciferol (VITAMIN D3) 1.25 MG (50000 UT) CAPS Take 1 capsule by mouth every 30 (thirty) days. 3 capsule 3  . denosumab (PROLIA) 60 MG/ML SOLN injection Inject 60 mg into the skin every 6 (six) months. Administer in upper arm, thigh, or abdomen    . famotidine (PEPCID) 20 MG tablet Take 20 mg by mouth at bedtime.     Marland Kitchen LORazepam (ATIVAN) 0.5 MG tablet TAKE 1 TABLET ONCE DAILY AS NEEDED FOR ANXIETY. 30 tablet 3  . Multiple Vitamin (MULTIVITAMIN) tablet Take 1 tablet by mouth at bedtime.     Vladimir Faster Glycol-Propyl Glycol (SYSTANE OP) Place 1 drop into both eyes 3 (three) times daily as needed (dry eyes).    . prednisoLONE acetate (PRED FORTE) 1 % ophthalmic suspension     . Probiotic Product (ALIGN) 4 MG CAPS Take 1 capsule (4 mg total) by mouth daily. 30 capsule 0   No facility-administered medications prior to visit.    ROS: Review of Systems  Constitutional: Negative for activity change, appetite change, chills, fatigue and unexpected weight  change.  HENT: Negative for congestion, mouth sores and sinus pressure.   Eyes: Negative for visual disturbance.  Respiratory: Negative for cough and chest tightness.   Gastrointestinal: Negative for abdominal pain and nausea.  Genitourinary: Negative for difficulty urinating, frequency and vaginal pain.  Musculoskeletal: Negative for back pain and gait problem.  Skin: Negative for pallor and rash.  Neurological: Positive for numbness. Negative for dizziness, tremors, weakness and headaches.  Psychiatric/Behavioral: Negative for confusion and sleep disturbance.    Objective:  BP 124/76 (BP Location: Left Arm)   Pulse 68   Temp 97.6 F (36.4 C) (Oral)   Wt 126 lb (57.2 kg)   SpO2 98%   BMI 23.05 kg/m   BP Readings from Last 3 Encounters:  01/10/20 124/76  12/10/19 118/74  10/07/19 (!) 130/82    Wt Readings from Last 3 Encounters:  01/10/20 126 lb (57.2 kg)  12/10/19 128 lb (58.1 kg)  10/07/19 129 lb (58.5 kg)    Physical Exam Constitutional:      General: She is not in acute distress.    Appearance: She is well-developed.  HENT:     Head: Normocephalic.     Right Ear: External ear normal.     Left Ear: External ear normal.     Nose: Nose normal.  Eyes:  General:        Right eye: No discharge.        Left eye: No discharge.     Conjunctiva/sclera: Conjunctivae normal.     Pupils: Pupils are equal, round, and reactive to light.  Neck:     Thyroid: No thyromegaly.     Vascular: No JVD.     Trachea: No tracheal deviation.  Cardiovascular:     Rate and Rhythm: Normal rate and regular rhythm.     Heart sounds: Normal heart sounds.  Pulmonary:     Effort: No respiratory distress.     Breath sounds: No stridor. No wheezing.  Abdominal:     General: Bowel sounds are normal. There is no distension.     Palpations: Abdomen is soft. There is no mass.     Tenderness: There is no abdominal tenderness. There is no guarding or rebound.  Musculoskeletal:         General: No tenderness.     Cervical back: Normal range of motion and neck supple.  Lymphadenopathy:     Cervical: No cervical adenopathy.  Skin:    Findings: No erythema or rash.  Neurological:     Mental Status: She is oriented to person, place, and time.     Cranial Nerves: No cranial nerve deficit.     Motor: No weakness or abnormal muscle tone.     Coordination: Coordination normal.     Deep Tendon Reflexes: Reflexes normal.  Psychiatric:        Behavior: Behavior normal.        Thought Content: Thought content normal.        Judgment: Judgment normal.    Both shoulders and neck are with full range of motion Muscle strength normal Grip normal  Lab Results  Component Value Date   WBC 7.4 08/26/2019   HGB 12.6 08/26/2019   HCT 38.2 08/26/2019   PLT 308.0 08/26/2019   GLUCOSE 111 (H) 08/26/2019   CHOL 208 (H) 11/03/2018   TRIG 176.0 (H) 11/03/2018   HDL 55.80 11/03/2018   LDLCALC 117 (H) 11/03/2018   ALT 11 07/29/2019   AST 15 07/29/2019   NA 137 08/26/2019   K 3.7 08/26/2019   CL 106 08/26/2019   CREATININE 1.19 08/26/2019   BUN 41 (H) 08/26/2019   CO2 23 08/26/2019   TSH 2.37 07/12/2019   INR 1.3 (H) 05/11/2019   HGBA1C 6.0 (H) 11/03/2014    NM Hepato W/EF  Result Date: 08/18/2019 CLINICAL DATA:  Upper abdominal pain EXAM: NUCLEAR MEDICINE HEPATOBILIARY IMAGING WITH GALLBLADDER EF VIEWS: Anterior right upper quadrant RADIOPHARMACEUTICALS:  5.1 mCi Tc-57m  Choletec IV COMPARISON:  None. FINDINGS: Liver uptake of radiotracer is unremarkable. There is prompt visualization of gallbladder and small bowel, indicating patency of the cystic and common bile ducts. The patient consumed 8 ounces of Ensure orally calculation of the computer generated ejection fraction of radiotracer from the gallbladder. Patient did not experience clinical symptoms with the oral Ensure consumption. The computer generated ejection fraction of radiotracer from the gallbladder is normal at 62%,  normal greater than 33% using the oral agent. IMPRESSION: Study within normal limits. Electronically Signed   By: Lowella Grip III M.D.   On: 08/18/2019 09:43    Assessment & Plan:    Walker Kehr, MD

## 2020-01-14 ENCOUNTER — Other Ambulatory Visit: Payer: Self-pay | Admitting: Internal Medicine

## 2020-01-17 ENCOUNTER — Encounter: Payer: Self-pay | Admitting: Internal Medicine

## 2020-01-25 ENCOUNTER — Other Ambulatory Visit: Payer: Self-pay | Admitting: Internal Medicine

## 2020-01-26 DIAGNOSIS — H3540 Unspecified peripheral retinal degeneration: Secondary | ICD-10-CM | POA: Diagnosis not present

## 2020-01-26 DIAGNOSIS — H35373 Puckering of macula, bilateral: Secondary | ICD-10-CM | POA: Diagnosis not present

## 2020-01-26 DIAGNOSIS — H35352 Cystoid macular degeneration, left eye: Secondary | ICD-10-CM | POA: Diagnosis not present

## 2020-01-26 DIAGNOSIS — H43811 Vitreous degeneration, right eye: Secondary | ICD-10-CM | POA: Diagnosis not present

## 2020-01-26 IMAGING — DX DG CHEST 2V
2 series · 2 of 2 positions shown · non-contrast
Comparison: Chest x-rays dated 12/18/2018 and 03/02/2018 and
08/15/2017 and chest CT dated 08/23/2015

CLINICAL DATA: Follow-up of abnormal chest x-ray.

EXAM:
CHEST - 2 VIEW

[chest pa]
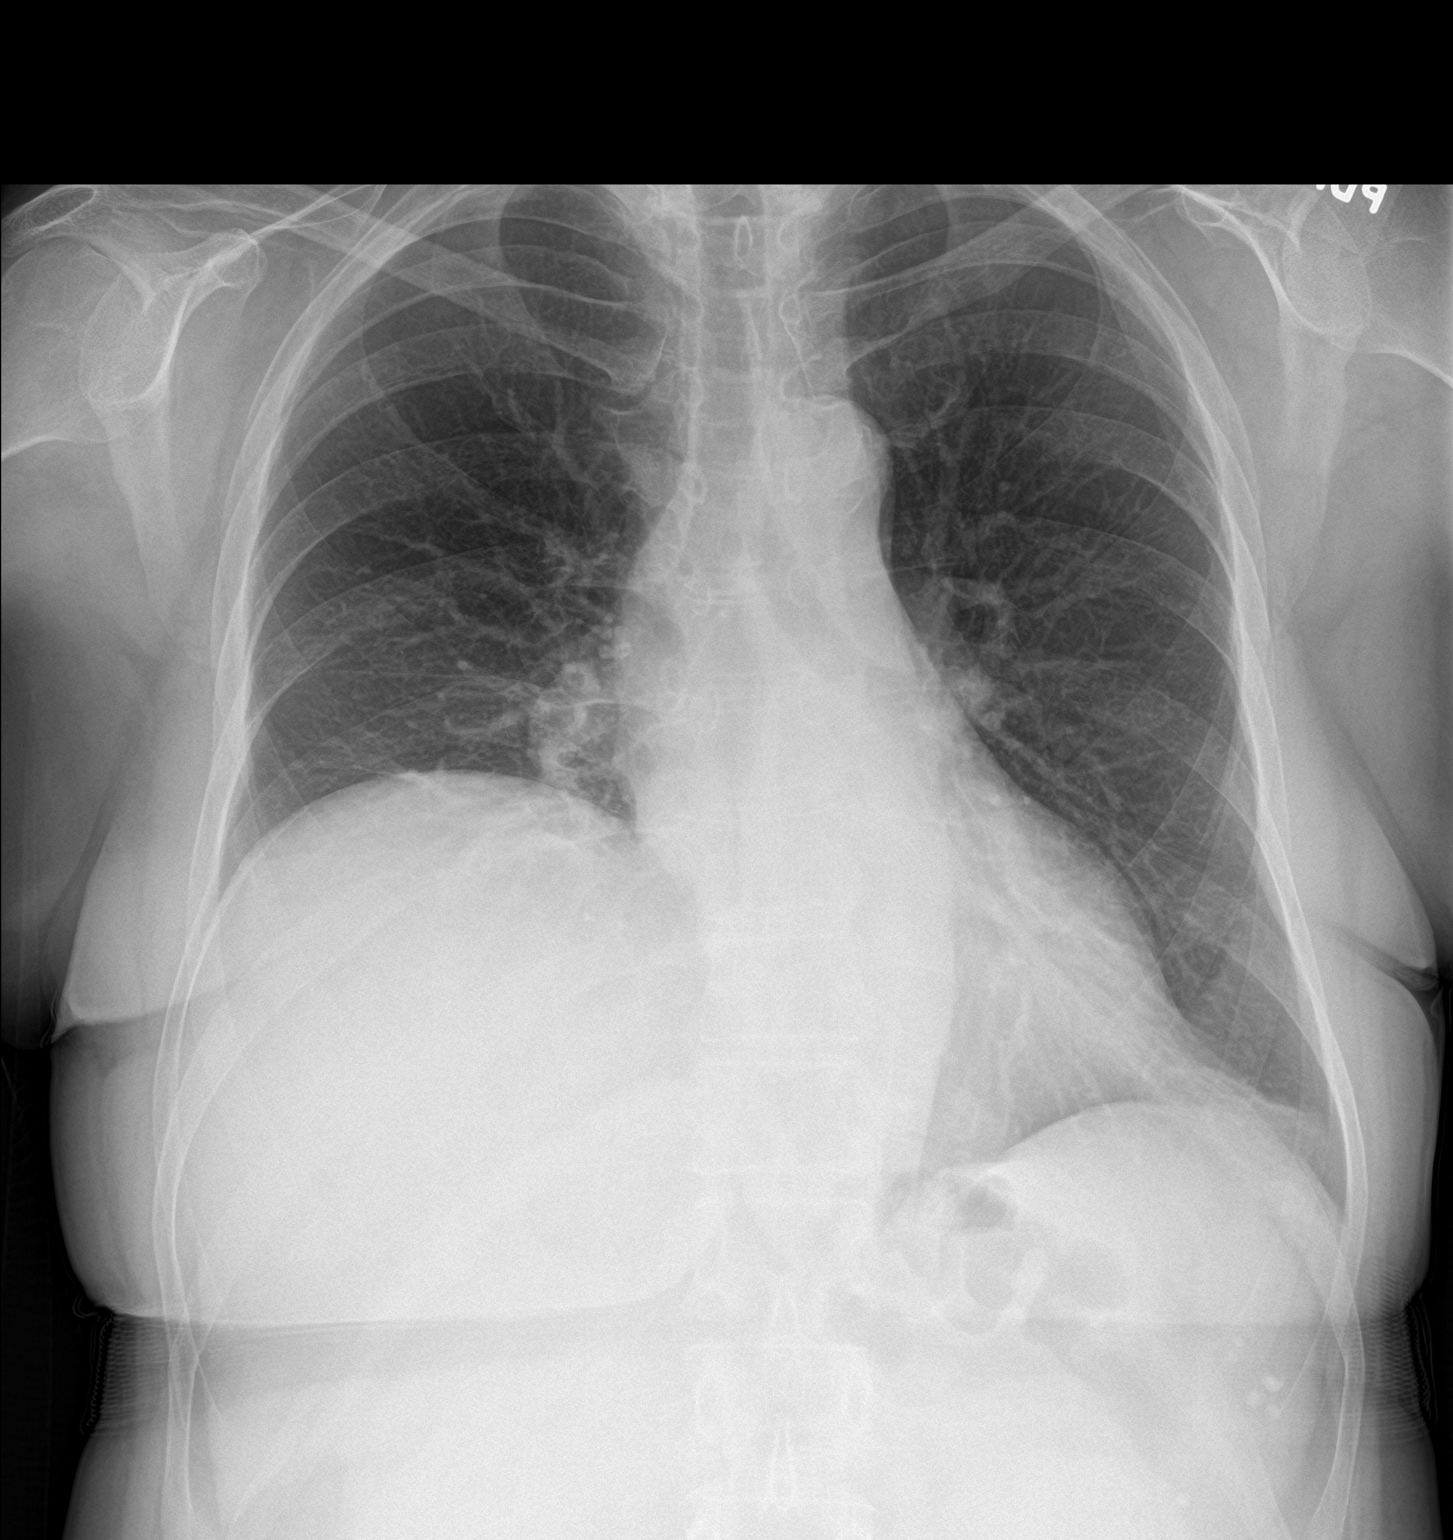

[chest lat]
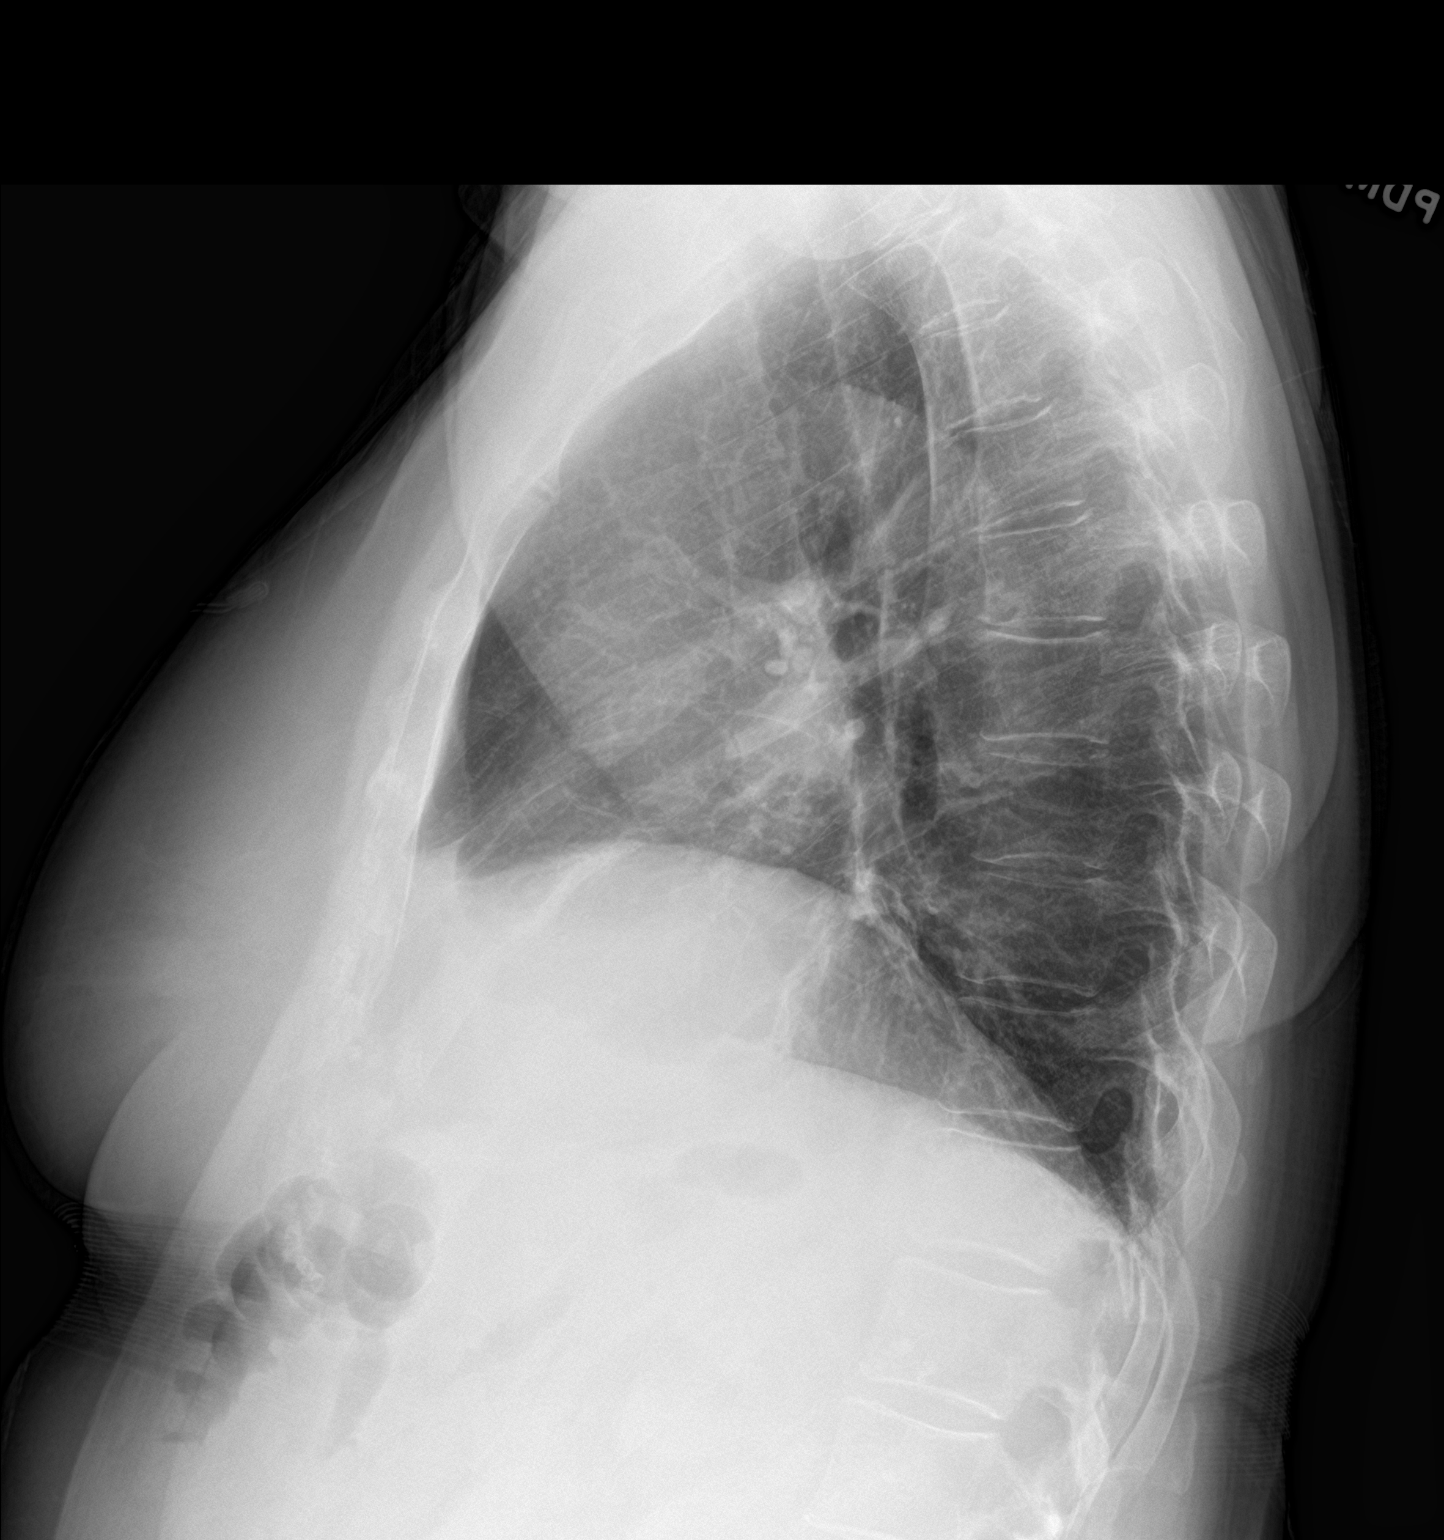

[2 of 2 positions shown; findings below may reference images not displayed]

FINDINGS: There has been further elevation of right hemidiaphragm since
12/18/2018. There are no discrete infiltrates or effusions. Heart
size and pulmonary vascularity appear normal. No discrete mass in
the right hilum. No effusions. No bone abnormality.
IMPRESSION: Further elevation of the right hemidiaphragm since [DATE].
However, the right hemidiaphragm has been chronically elevated
including on the study of 08/23/2015. No visible mass in the right
hilum. No other significant abnormalities.

## 2020-01-31 ENCOUNTER — Other Ambulatory Visit: Payer: Self-pay | Admitting: Internal Medicine

## 2020-02-10 ENCOUNTER — Telehealth: Payer: Self-pay | Admitting: Emergency Medicine

## 2020-02-10 DIAGNOSIS — H43811 Vitreous degeneration, right eye: Secondary | ICD-10-CM | POA: Diagnosis not present

## 2020-02-10 DIAGNOSIS — H35352 Cystoid macular degeneration, left eye: Secondary | ICD-10-CM | POA: Diagnosis not present

## 2020-02-10 DIAGNOSIS — H35373 Puckering of macula, bilateral: Secondary | ICD-10-CM | POA: Diagnosis not present

## 2020-02-10 NOTE — Telephone Encounter (Signed)
ATC patient to let her know of recs from Dr. Lamonte Sakai. Per DPR left detailed message and advised patient to call with any other issues or concerns. Nothing further needed at this time.

## 2020-02-10 NOTE — Telephone Encounter (Signed)
The surgeon may give specific instructions regarding the appropriate time to stop, but she should stop at least 2 days prior. Then restart whenever her surgeon says its ok

## 2020-02-10 NOTE — Telephone Encounter (Signed)
Called and spoke with Patient. Patient stated she is having eye surgery 02/16/20, similar to what she had done in August 2021. Patient wanted to know when to stop Eliquis and start back after eye surgery. Patient stated it is ok to leave detailed message on her VM.   Recommendations 10/04/19 from Dr. Lamonte Sakai:   Yes it is absolutely ok for her to hold Eliquis 2 days prior to the procedure, and then to restart it when instructed by her surgeon.    Message routed to Dr Lamonte Sakai to advise

## 2020-02-15 DIAGNOSIS — L738 Other specified follicular disorders: Secondary | ICD-10-CM | POA: Diagnosis not present

## 2020-02-15 DIAGNOSIS — L814 Other melanin hyperpigmentation: Secondary | ICD-10-CM | POA: Diagnosis not present

## 2020-02-15 DIAGNOSIS — Z85828 Personal history of other malignant neoplasm of skin: Secondary | ICD-10-CM | POA: Diagnosis not present

## 2020-02-16 DIAGNOSIS — H3581 Retinal edema: Secondary | ICD-10-CM | POA: Diagnosis not present

## 2020-02-16 DIAGNOSIS — H35372 Puckering of macula, left eye: Secondary | ICD-10-CM | POA: Diagnosis not present

## 2020-02-17 ENCOUNTER — Ambulatory Visit: Payer: Medicare Other

## 2020-02-22 DIAGNOSIS — D2372 Other benign neoplasm of skin of left lower limb, including hip: Secondary | ICD-10-CM | POA: Diagnosis not present

## 2020-02-22 DIAGNOSIS — D225 Melanocytic nevi of trunk: Secondary | ICD-10-CM | POA: Diagnosis not present

## 2020-02-22 DIAGNOSIS — Z85828 Personal history of other malignant neoplasm of skin: Secondary | ICD-10-CM | POA: Diagnosis not present

## 2020-02-22 DIAGNOSIS — L821 Other seborrheic keratosis: Secondary | ICD-10-CM | POA: Diagnosis not present

## 2020-02-23 DIAGNOSIS — H3581 Retinal edema: Secondary | ICD-10-CM | POA: Diagnosis not present

## 2020-02-23 DIAGNOSIS — H35372 Puckering of macula, left eye: Secondary | ICD-10-CM | POA: Diagnosis not present

## 2020-03-16 DIAGNOSIS — H35352 Cystoid macular degeneration, left eye: Secondary | ICD-10-CM | POA: Diagnosis not present

## 2020-03-29 ENCOUNTER — Ambulatory Visit
Admission: RE | Admit: 2020-03-29 | Discharge: 2020-03-29 | Disposition: A | Discharge status: Home / Self Care | Payer: Medicare Other | Source: Ambulatory Visit | Attending: Internal Medicine | Admitting: Internal Medicine

## 2020-03-29 ENCOUNTER — Other Ambulatory Visit: Payer: Self-pay

## 2020-03-29 DIAGNOSIS — Z1231 Encounter for screening mammogram for malignant neoplasm of breast: Secondary | ICD-10-CM | POA: Diagnosis not present

## 2020-04-08 ENCOUNTER — Other Ambulatory Visit: Payer: Self-pay | Admitting: Internal Medicine

## 2020-04-08 DIAGNOSIS — R059 Cough, unspecified: Secondary | ICD-10-CM

## 2020-04-08 DIAGNOSIS — Z7189 Other specified counseling: Secondary | ICD-10-CM

## 2020-04-10 ENCOUNTER — Encounter: Payer: Self-pay | Admitting: Internal Medicine

## 2020-04-10 ENCOUNTER — Other Ambulatory Visit: Payer: Self-pay

## 2020-04-10 ENCOUNTER — Ambulatory Visit (INDEPENDENT_AMBULATORY_CARE_PROVIDER_SITE_OTHER): Payer: Medicare Other | Admitting: Internal Medicine

## 2020-04-10 DIAGNOSIS — I2692 Saddle embolus of pulmonary artery without acute cor pulmonale: Secondary | ICD-10-CM

## 2020-04-10 DIAGNOSIS — F419 Anxiety disorder, unspecified: Secondary | ICD-10-CM

## 2020-04-10 DIAGNOSIS — E559 Vitamin D deficiency, unspecified: Secondary | ICD-10-CM | POA: Diagnosis not present

## 2020-04-10 DIAGNOSIS — K862 Cyst of pancreas: Secondary | ICD-10-CM

## 2020-04-10 DIAGNOSIS — M810 Age-related osteoporosis without current pathological fracture: Secondary | ICD-10-CM

## 2020-04-10 DIAGNOSIS — F439 Reaction to severe stress, unspecified: Secondary | ICD-10-CM | POA: Diagnosis not present

## 2020-04-10 DIAGNOSIS — N1832 Chronic kidney disease, stage 3b: Secondary | ICD-10-CM

## 2020-04-10 LAB — BASIC METABOLIC PANEL
BUN: 38 mg/dL — ABNORMAL HIGH (ref 6–23)
CO2: 27 mEq/L (ref 19–32)
Calcium: 9.7 mg/dL (ref 8.4–10.5)
Chloride: 105 mEq/L (ref 96–112)
Creatinine, Ser: 1.13 mg/dL (ref 0.40–1.20)
GFR: 46.33 mL/min — ABNORMAL LOW (ref >60.00)
Glucose, Bld: 112 mg/dL — ABNORMAL HIGH (ref 70–99)
Potassium: 4.4 mEq/L (ref 3.5–5.1)
Sodium: 138 mEq/L (ref 135–145)

## 2020-04-10 NOTE — Assessment & Plan Note (Signed)
Repeat MRI in Sept 2022

## 2020-04-10 NOTE — Assessment & Plan Note (Signed)
On Lorazepam prn  Potential benefits of a long term benzodiazepines  use as well as potential risks  and complications were explained to the patient and were aknowledged. 

## 2020-04-10 NOTE — Assessment & Plan Note (Addendum)
On Prolia BDS is due in 2022

## 2020-04-10 NOTE — Patient Instructions (Signed)
You can try [employer]'s [employer]ane Mushroom capsules for memory, focus, neuropathy ([employer] or [employer])   

## 2020-04-10 NOTE — Assessment & Plan Note (Signed)
On Eliquis

## 2020-04-10 NOTE — Assessment & Plan Note (Signed)
Check BMET

## 2020-04-10 NOTE — Progress Notes (Signed)
Subjective:  Patient ID: Kristen Castillo, female    DOB: March 21, 1940  Age: 80 y.o. MRN: [id number]  CC: Follow-up (6 month f/u)   HPI Kristen Castillo presents for abn abd MRI, osteoporosis, anxiety C/o memory issues - a little  Outpatient Medications Prior to Visit  Medication Sig Dispense Refill  . apixaban (ELIQUIS) 2.5 MG TABS tablet Take 1 tablet (2.5 mg total) by mouth 2 (two) times daily. MUST HAVE OV FOR FURTHER REFILLS 60 tablet 12  . benzonatate (TESSALON) 100 MG capsule TAKE  (1)  CAPSULE THREE TIMES DAILY AS NEEDED. (Patient taking differently: TAKE  (1)  CAPSULE BY MOUTH ONCE A DAY) 60 capsule 0  . Bromfenac Sodium (PROLENSA) 0.07 % SOLN Place 1 drop into the left eye daily.    . budesonide (RHINOCORT AQUA) 32 MCG/ACT nasal spray Place 1 spray into both nostrils in the morning and at bedtime.     . calcium carbonate (OS-CAL) 600 MG TABS tablet Take 600 mg by mouth daily.     Marland Kitchen denosumab (PROLIA) 60 MG/ML SOLN injection Inject 60 mg into the skin every 6 (six) months. Administer in upper arm, thigh, or abdomen    . famotidine (PEPCID) 20 MG tablet Take 20 mg by mouth at bedtime.     Marland Kitchen LORazepam (ATIVAN) 0.5 MG tablet TAKE 1 TABLET ONCE DAILY AS NEEDED FOR ANXIETY. 30 tablet 3  . Multiple Vitamin (MULTIVITAMIN) tablet Take 1 tablet by mouth every morning.    Vladimir Faster Glycol-Propyl Glycol (SYSTANE OP) Place 1 drop into both eyes 3 (three) times daily as needed (dry eyes).    . prednisoLONE acetate (PRED FORTE) 1 % ophthalmic suspension     . Probiotic Product (ALIGN) 4 MG CAPS Take 1 capsule (4 mg total) by mouth daily. 30 capsule 0  . methylPREDNISolone (MEDROL DOSEPAK) 4 MG TBPK tablet As directed (Patient not taking: Reported on 04/10/2020) 21 tablet 0  . WEEKLY-D 1.25 MG (50000 UT) capsule TAKE 1 CAPSULE EVERY 30 DAYS AS DIRECTED. (Patient not taking: Reported on 04/10/2020) 12 capsule 3   No facility-administered medications prior to visit.    ROS: Review of  Systems  Constitutional: Negative for activity change, appetite change, chills, fatigue and unexpected weight change.  HENT: Negative for congestion, mouth sores and sinus pressure.   Eyes: Negative for visual disturbance.  Respiratory: Negative for cough and chest tightness.   Gastrointestinal: Negative for abdominal pain and nausea.  Genitourinary: Negative for difficulty urinating, frequency and vaginal pain.  Musculoskeletal: Negative for back pain and gait problem.  Skin: Negative for pallor and rash.  Neurological: Negative for dizziness, tremors, weakness, numbness and headaches.  Psychiatric/Behavioral: Negative for confusion and sleep disturbance.    Objective:  BP 130/82 (BP Location: Left Arm)   Pulse 82   Temp 97.9 F (36.6 C) (Oral)   Ht 5\' 2"  (1.575 m)   Wt 122 lb 12.8 oz (55.7 kg)   SpO2 97%   BMI 22.46 kg/m   BP Readings from Last 3 Encounters:  04/10/20 130/82  01/10/20 124/76  12/10/19 118/74    Wt Readings from Last 3 Encounters:  04/10/20 122 lb 12.8 oz (55.7 kg)  01/10/20 126 lb (57.2 kg)  12/10/19 128 lb (58.1 kg)    Physical Exam Constitutional:      General: She is not in acute distress.    Appearance: She is well-developed.  HENT:     Head: Normocephalic.     Right Ear: External ear  normal.     Left Ear: External ear normal.     Nose: Nose normal.     Mouth/Throat:     Mouth: Oropharynx is clear and moist.  Eyes:     General:        Right eye: No discharge.        Left eye: No discharge.     Conjunctiva/sclera: Conjunctivae normal.     Pupils: Pupils are equal, round, and reactive to light.  Neck:     Thyroid: No thyromegaly.     Vascular: No JVD.     Trachea: No tracheal deviation.  Cardiovascular:     Rate and Rhythm: Normal rate and regular rhythm.     Heart sounds: Normal heart sounds.  Pulmonary:     Effort: No respiratory distress.     Breath sounds: No stridor. No wheezing.  Abdominal:     General: Bowel sounds are  normal. There is no distension.     Palpations: Abdomen is soft. There is no mass.     Tenderness: There is no abdominal tenderness. There is no guarding or rebound.  Musculoskeletal:        General: No tenderness or edema.     Cervical back: Normal range of motion and neck supple.  Lymphadenopathy:     Cervical: No cervical adenopathy.  Skin:    Findings: No erythema or rash.  Neurological:     Cranial Nerves: No cranial nerve deficit.     Motor: No abnormal muscle tone.     Coordination: Coordination normal.     Deep Tendon Reflexes: Reflexes normal.  Psychiatric:        Mood and Affect: Mood and affect normal.        Behavior: Behavior normal.        Thought Content: Thought content normal.        Judgment: Judgment normal.     Lab Results  Component Value Date   WBC 7.4 08/26/2019   HGB 12.6 08/26/2019   HCT 38.2 08/26/2019   PLT 308.0 08/26/2019   GLUCOSE 111 (H) 08/26/2019   CHOL 208 (H) 11/03/2018   TRIG 176.0 (H) 11/03/2018   HDL 55.80 11/03/2018   LDLCALC 117 (H) 11/03/2018   ALT 11 07/29/2019   AST 15 07/29/2019   NA 137 08/26/2019   K 3.7 08/26/2019   CL 106 08/26/2019   CREATININE 1.19 08/26/2019   BUN 41 (H) 08/26/2019   CO2 23 08/26/2019   TSH 2.37 07/12/2019   INR 1.3 (H) 05/11/2019   HGBA1C 6.0 (H) 11/03/2014    MM 3D SCREEN BREAST BILATERAL  Result Date: 03/29/2020 CLINICAL DATA:  Screening. EXAM: DIGITAL SCREENING BILATERAL MAMMOGRAM WITH TOMO AND CAD COMPARISON:  Previous exam(s). ACR Breast Density Category c: The breast tissue is heterogeneously dense, which may obscure small masses. FINDINGS: There are no findings suspicious for malignancy. Images were processed with CAD. IMPRESSION: No mammographic evidence of malignancy. A result letter of this screening mammogram will be mailed directly to the patient. RECOMMENDATION: Screening mammogram in one year. (Code:SM-B-01Y) BI-RADS CATEGORY  1: Negative. Electronically Signed   By: Audie Pinto  M.D.   On: 03/29/2020 12:11    Assessment & Plan:    Walker Kehr, MD

## 2020-04-10 NOTE — Assessment & Plan Note (Addendum)
Lorazepam prn - rare  Potential benefits of a long term benzodiazepines  use as well as potential risks  and complications were explained to the patient and were aknowledged.

## 2020-04-10 NOTE — Assessment & Plan Note (Signed)
On Vit D 

## 2020-04-20 DIAGNOSIS — H35373 Puckering of macula, bilateral: Secondary | ICD-10-CM | POA: Diagnosis not present

## 2020-04-20 DIAGNOSIS — H35352 Cystoid macular degeneration, left eye: Secondary | ICD-10-CM | POA: Diagnosis not present

## 2020-04-24 ENCOUNTER — Ambulatory Visit: Payer: Medicare Other | Admitting: Psychology

## 2020-05-03 ENCOUNTER — Ambulatory Visit: Payer: Medicare Other | Admitting: Psychology

## 2020-05-03 DIAGNOSIS — M810 Age-related osteoporosis without current pathological fracture: Secondary | ICD-10-CM | POA: Diagnosis not present

## 2020-05-03 DIAGNOSIS — M8589 Other specified disorders of bone density and structure, multiple sites: Secondary | ICD-10-CM | POA: Diagnosis not present

## 2020-05-03 LAB — HM DEXA SCAN

## 2020-05-08 ENCOUNTER — Ambulatory Visit (INDEPENDENT_AMBULATORY_CARE_PROVIDER_SITE_OTHER): Payer: Medicare Other | Admitting: Psychology

## 2020-05-08 ENCOUNTER — Encounter: Payer: Self-pay | Admitting: Internal Medicine

## 2020-05-08 DIAGNOSIS — F4320 Adjustment disorder, unspecified: Secondary | ICD-10-CM

## 2020-05-23 DIAGNOSIS — Z85828 Personal history of other malignant neoplasm of skin: Secondary | ICD-10-CM | POA: Diagnosis not present

## 2020-05-23 DIAGNOSIS — D1801 Hemangioma of skin and subcutaneous tissue: Secondary | ICD-10-CM | POA: Diagnosis not present

## 2020-05-23 DIAGNOSIS — D485 Neoplasm of uncertain behavior of skin: Secondary | ICD-10-CM | POA: Diagnosis not present

## 2020-05-23 DIAGNOSIS — L738 Other specified follicular disorders: Secondary | ICD-10-CM | POA: Diagnosis not present

## 2020-05-23 DIAGNOSIS — L814 Other melanin hyperpigmentation: Secondary | ICD-10-CM | POA: Diagnosis not present

## 2020-05-26 DIAGNOSIS — M545 Low back pain, unspecified: Secondary | ICD-10-CM | POA: Diagnosis not present

## 2020-05-26 DIAGNOSIS — R634 Abnormal weight loss: Secondary | ICD-10-CM | POA: Diagnosis not present

## 2020-05-26 DIAGNOSIS — N281 Cyst of kidney, acquired: Secondary | ICD-10-CM | POA: Diagnosis not present

## 2020-05-26 DIAGNOSIS — N132 Hydronephrosis with renal and ureteral calculous obstruction: Secondary | ICD-10-CM | POA: Diagnosis not present

## 2020-05-26 DIAGNOSIS — N202 Calculus of kidney with calculus of ureter: Secondary | ICD-10-CM | POA: Diagnosis not present

## 2020-05-29 ENCOUNTER — Other Ambulatory Visit: Payer: Self-pay | Admitting: Internal Medicine

## 2020-05-29 NOTE — Telephone Encounter (Signed)
Check [place] registry last filled 04/08/2020..lmb

## 2020-05-30 ENCOUNTER — Other Ambulatory Visit (HOSPITAL_COMMUNITY)
Admission: RE | Admit: 2020-05-30 | Discharge: 2020-05-30 | Disposition: A | Discharge status: Home / Self Care | Payer: Medicare Other | Source: Ambulatory Visit | Attending: Urology | Admitting: Urology

## 2020-05-30 ENCOUNTER — Other Ambulatory Visit: Payer: Self-pay

## 2020-05-30 ENCOUNTER — Other Ambulatory Visit: Payer: Self-pay | Admitting: Urology

## 2020-05-30 ENCOUNTER — Telehealth: Payer: Self-pay | Admitting: Emergency Medicine

## 2020-05-30 ENCOUNTER — Encounter (HOSPITAL_COMMUNITY): Payer: Self-pay | Admitting: Urology

## 2020-05-30 ENCOUNTER — Other Ambulatory Visit: Payer: Self-pay | Admitting: Internal Medicine

## 2020-05-30 DIAGNOSIS — R059 Cough, unspecified: Secondary | ICD-10-CM

## 2020-05-30 DIAGNOSIS — Z01812 Encounter for preprocedural laboratory examination: Secondary | ICD-10-CM | POA: Diagnosis not present

## 2020-05-30 DIAGNOSIS — Z20822 Contact with and (suspected) exposure to covid-19: Secondary | ICD-10-CM | POA: Insufficient documentation

## 2020-05-30 DIAGNOSIS — Z7189 Other specified counseling: Secondary | ICD-10-CM

## 2020-05-30 LAB — SARS CORONAVIRUS 2 (TAT 6-24 HRS): SARS Coronavirus 2: NEGATIVE

## 2020-05-30 NOTE — Telephone Encounter (Signed)
Called and spoke with patient to let her know recs of Dr. Lamonte Sakai. She expressed understanding. Advised her to call if she needed anything. Nothing further needed at this time.

## 2020-05-30 NOTE — Telephone Encounter (Signed)
Thank you for letting me know. I agree that it is OK for her to stop the Eliquis tonight. She can hold it until instructed that it is safe to restart by her surgeon.

## 2020-05-30 NOTE — Progress Notes (Signed)
COVID Vaccine Completed: Yes  Date COVID Vaccine completed: 04/17/19 Has received booster: 12/29/19 COVID vaccine manufacturer:   Moderna    Date of COVID positive in last 90 days: No  PCP - Cassandria Anger, MD last office visit 04/10/20 Cardiologist - N/A  Chest x-ray - greater than 1 year EKG - greater than 1 year Stress Test - N/A ECHO - greater than 2 year Cardiac Cath - N/A Pacemaker/ICD device last checked: N/A  Sleep Study - N/A CPAP - N/A  Fasting Blood Sugar - N/A Checks Blood Sugar __N/A___ times a day  Blood Thinner Instructions: Eliquis PATIENT TO REMAIN ON ALL ANTICOAGULANTS PER DR. MANNY. Aspirin Instructions: N/A Last Dose: N/A  Activity level: Can go up a flight of stairs and activities of daily living without stopping and without symptoms    Anesthesia review: N/A  Patient denies shortness of breath, fever, cough and chest pain at PAT appointment   Patient verbalized understanding of instructions that were given to them at the PAT appointment. Patient was also instructed that they will need to review over the PAT instructions again at home before surgery.

## 2020-05-30 NOTE — Telephone Encounter (Signed)
Spoke with the pt  She states just found out today that she is scheduled for surgery tomorrow for kidney stone  She was advised to hold her eliquis tonight and tomorrow  Wants to make Dr Lamonte Sakai aware  Her procedure is 05/31/20 at 4:45 pm  Please advise thanks

## 2020-05-31 ENCOUNTER — Ambulatory Visit (HOSPITAL_COMMUNITY): Payer: Medicare Other

## 2020-05-31 ENCOUNTER — Ambulatory Visit (HOSPITAL_COMMUNITY): Payer: Medicare Other | Admitting: Anesthesiology

## 2020-05-31 ENCOUNTER — Encounter (HOSPITAL_COMMUNITY)
Admission: RE | Disposition: A | Discharge status: Home / Self Care | Payer: Self-pay | Source: Home / Self Care | Attending: Urology

## 2020-05-31 ENCOUNTER — Encounter (HOSPITAL_COMMUNITY): Payer: Self-pay | Admitting: Urology

## 2020-05-31 ENCOUNTER — Ambulatory Visit (HOSPITAL_COMMUNITY)
Admission: RE | Admit: 2020-05-31 | Discharge: 2020-05-31 | Disposition: A | Discharge status: Home / Self Care | Payer: Medicare Other | Attending: Urology | Admitting: Urology

## 2020-05-31 DIAGNOSIS — N201 Calculus of ureter: Secondary | ICD-10-CM | POA: Insufficient documentation

## 2020-05-31 DIAGNOSIS — Z85828 Personal history of other malignant neoplasm of skin: Secondary | ICD-10-CM | POA: Insufficient documentation

## 2020-05-31 DIAGNOSIS — Z800 Family history of malignant neoplasm of digestive organs: Secondary | ICD-10-CM | POA: Insufficient documentation

## 2020-05-31 DIAGNOSIS — Z90710 Acquired absence of both cervix and uterus: Secondary | ICD-10-CM | POA: Diagnosis not present

## 2020-05-31 DIAGNOSIS — Z885 Allergy status to narcotic agent status: Secondary | ICD-10-CM | POA: Diagnosis not present

## 2020-05-31 DIAGNOSIS — E559 Vitamin D deficiency, unspecified: Secondary | ICD-10-CM | POA: Diagnosis not present

## 2020-05-31 DIAGNOSIS — Z8759 Personal history of other complications of pregnancy, childbirth and the puerperium: Secondary | ICD-10-CM | POA: Diagnosis not present

## 2020-05-31 DIAGNOSIS — Z86718 Personal history of other venous thrombosis and embolism: Secondary | ICD-10-CM | POA: Diagnosis not present

## 2020-05-31 DIAGNOSIS — N202 Calculus of kidney with calculus of ureter: Secondary | ICD-10-CM | POA: Diagnosis not present

## 2020-05-31 DIAGNOSIS — Z90721 Acquired absence of ovaries, unilateral: Secondary | ICD-10-CM | POA: Insufficient documentation

## 2020-05-31 DIAGNOSIS — Z801 Family history of malignant neoplasm of trachea, bronchus and lung: Secondary | ICD-10-CM | POA: Diagnosis not present

## 2020-05-31 DIAGNOSIS — K219 Gastro-esophageal reflux disease without esophagitis: Secondary | ICD-10-CM | POA: Diagnosis not present

## 2020-05-31 DIAGNOSIS — Z9049 Acquired absence of other specified parts of digestive tract: Secondary | ICD-10-CM | POA: Insufficient documentation

## 2020-05-31 DIAGNOSIS — Z882 Allergy status to sulfonamides status: Secondary | ICD-10-CM | POA: Diagnosis not present

## 2020-05-31 DIAGNOSIS — M810 Age-related osteoporosis without current pathological fracture: Secondary | ICD-10-CM | POA: Diagnosis not present

## 2020-05-31 HISTORY — DX: Unspecified malignant neoplasm of skin, unspecified: C44.90

## 2020-05-31 HISTORY — PX: HOLMIUM LASER APPLICATION: SHX5852

## 2020-05-31 HISTORY — PX: CYSTOSCOPY WITH RETROGRADE PYELOGRAM, URETEROSCOPY AND STENT PLACEMENT: SHX5789

## 2020-05-31 HISTORY — DX: Calculus of gallbladder without cholecystitis without obstruction: K80.20

## 2020-05-31 LAB — CBC
HCT: 37.7 % (ref 36.0–46.0)
Hemoglobin: 12.1 g/dL (ref 12.0–15.0)
MCH: 29.2 pg (ref 26.0–34.0)
MCHC: 32.1 g/dL (ref 30.0–36.0)
MCV: 90.8 fL (ref 80.0–100.0)
Platelets: 292 10*3/uL (ref 150–400)
RBC: 4.15 MIL/uL (ref 3.87–5.11)
RDW: 13.1 % (ref 11.5–15.5)
WBC: 10.5 10*3/uL (ref 4.0–10.5)
nRBC: 0 % (ref 0.0–0.2)

## 2020-05-31 SURGERY — CYSTOURETEROSCOPY, WITH RETROGRADE PYELOGRAM AND STENT INSERTION
Anesthesia: General | Laterality: Left

## 2020-05-31 MED ORDER — PROPOFOL 10 MG/ML IV BOLUS
INTRAVENOUS | Status: DC | PRN
  Administered 2020-05-31: 150 mg via INTRAVENOUS

## 2020-05-31 MED ORDER — ACETAMINOPHEN 500 MG PO TABS
1000.0000 mg | ORAL_TABLET | Freq: Once | ORAL | Status: AC
  Administered 2020-05-31: 1000 mg via ORAL
  Filled 2020-05-31: qty 2

## 2020-05-31 MED ORDER — IOHEXOL 300 MG/ML  SOLN
INTRAMUSCULAR | Status: DC | PRN
  Administered 2020-05-31: 18 mL via URETHRAL

## 2020-05-31 MED ORDER — SENNOSIDES-DOCUSATE SODIUM 8.6-50 MG PO TABS: 1.0000 | ORAL_TABLET | Freq: Two times a day (BID) | ORAL | 0 refills | Status: DC

## 2020-05-31 MED ORDER — KETOROLAC TROMETHAMINE 10 MG PO TABS: 10.0000 mg | ORAL_TABLET | Freq: Three times a day (TID) | ORAL | 0 refills | Status: DC | PRN

## 2020-05-31 MED ORDER — DEXAMETHASONE SODIUM PHOSPHATE 10 MG/ML IJ SOLN
INTRAMUSCULAR | Status: DC | PRN
  Administered 2020-05-31: 10 mg via INTRAVENOUS

## 2020-05-31 MED ORDER — PROPOFOL 500 MG/50ML IV EMUL
INTRAVENOUS | Status: AC
  Filled 2020-05-31: qty 50

## 2020-05-31 MED ORDER — PROPOFOL 10 MG/ML IV BOLUS
INTRAVENOUS | Status: AC
  Filled 2020-05-31: qty 20

## 2020-05-31 MED ORDER — SODIUM CHLORIDE 0.9 % IR SOLN
Status: DC | PRN
  Administered 2020-05-31: 3000 mL via INTRAVESICAL

## 2020-05-31 MED ORDER — OXYCODONE-ACETAMINOPHEN 5-325 MG PO TABS
1.0000 | ORAL_TABLET | Freq: Four times a day (QID) | ORAL | 0 refills | Status: DC | PRN
Start: 2020-05-31 — End: 2020-10-17

## 2020-05-31 MED ORDER — ONDANSETRON HCL 4 MG/2ML IJ SOLN
INTRAMUSCULAR | Status: DC | PRN
  Administered 2020-05-31: 4 mg via INTRAVENOUS

## 2020-05-31 MED ORDER — LACTATED RINGERS IV SOLN: INTRAVENOUS | Status: DC

## 2020-05-31 MED ORDER — PROPOFOL 500 MG/50ML IV EMUL
INTRAVENOUS | Status: DC | PRN
  Administered 2020-05-31: 125 ug/kg/min via INTRAVENOUS

## 2020-05-31 MED ORDER — LIDOCAINE 2% (20 MG/ML) 5 ML SYRINGE
INTRAMUSCULAR | Status: DC | PRN
  Administered 2020-05-31: 100 mg via INTRAVENOUS

## 2020-05-31 MED ORDER — CHLORHEXIDINE GLUCONATE 0.12 % MT SOLN
15.0000 mL | Freq: Once | OROMUCOSAL | Status: AC
  Administered 2020-05-31: 15 mL via OROMUCOSAL

## 2020-05-31 MED ORDER — GENTAMICIN SULFATE 40 MG/ML IJ SOLN
5.0000 mg/kg | INTRAVENOUS | Status: AC
  Administered 2020-05-31: 280 mg via INTRAVENOUS
  Filled 2020-05-31: qty 7

## 2020-05-31 MED ORDER — DEXAMETHASONE SODIUM PHOSPHATE 10 MG/ML IJ SOLN
INTRAMUSCULAR | Status: AC
  Filled 2020-05-31: qty 1

## 2020-05-31 MED ORDER — FENTANYL CITRATE (PF) 100 MCG/2ML IJ SOLN
INTRAMUSCULAR | Status: DC | PRN
  Administered 2020-05-31: 25 ug via INTRAVENOUS

## 2020-05-31 MED ORDER — LIDOCAINE 2% (20 MG/ML) 5 ML SYRINGE
INTRAMUSCULAR | Status: AC
  Filled 2020-05-31: qty 5

## 2020-05-31 MED ORDER — ACETAMINOPHEN 10 MG/ML IV SOLN
INTRAVENOUS | Status: AC
  Filled 2020-05-31: qty 100

## 2020-05-31 MED ORDER — FENTANYL CITRATE (PF) 100 MCG/2ML IJ SOLN: 25.0000 ug | INTRAMUSCULAR | Status: DC | PRN

## 2020-05-31 MED ORDER — FENTANYL CITRATE (PF) 100 MCG/2ML IJ SOLN
INTRAMUSCULAR | Status: AC
  Filled 2020-05-31: qty 2

## 2020-05-31 MED ORDER — ORAL CARE MOUTH RINSE: 15.0000 mL | Freq: Once | OROMUCOSAL | Status: AC

## 2020-05-31 MED ORDER — ONDANSETRON HCL 4 MG/2ML IJ SOLN
INTRAMUSCULAR | Status: AC
  Filled 2020-05-31: qty 2

## 2020-05-31 MED ORDER — CEPHALEXIN 500 MG PO CAPS: 500.0000 mg | ORAL_CAPSULE | Freq: Two times a day (BID) | ORAL | 0 refills | Status: DC

## 2020-05-31 SURGICAL SUPPLY — 23 items
BAG URO CATCHER STRL LF (MISCELLANEOUS) ×2 IMPLANT
BASKET LASER NITINOL 1.9FR (BASKET) ×2 IMPLANT
CATH INTERMIT  6FR 70CM (CATHETERS) ×2 IMPLANT
CLOTH BEACON ORANGE TIMEOUT ST (SAFETY) ×2 IMPLANT
EXTRACTOR STONE 1.7FRX115CM (UROLOGICAL SUPPLIES) IMPLANT
GLOVE SURG ENC TEXT LTX SZ7.5 (GLOVE) ×2 IMPLANT
GOWN STRL REUS W/TWL LRG LVL3 (GOWN DISPOSABLE) ×2 IMPLANT
GUIDEWIRE ANG ZIPWIRE 038X150 (WIRE) ×2 IMPLANT
GUIDEWIRE STR DUAL SENSOR (WIRE) ×2 IMPLANT
KIT TURNOVER KIT A (KITS) ×2 IMPLANT
LASER FIB FLEXIVA PULSE ID 365 (Laser) IMPLANT
LASER FIB FLEXIVA PULSE ID 550 (Laser) IMPLANT
LASER FIB FLEXIVA PULSE ID 910 (Laser) IMPLANT
MANIFOLD NEPTUNE II (INSTRUMENTS) ×2 IMPLANT
PACK CYSTO (CUSTOM PROCEDURE TRAY) ×2 IMPLANT
SHEATH URETERAL 12FRX28CM (UROLOGICAL SUPPLIES) ×2 IMPLANT
SHEATH URETERAL 12FRX35CM (MISCELLANEOUS) IMPLANT
STENT POLARIS 5FRX24 (STENTS) ×2 IMPLANT
TRACTIP FLEXIVA PULS ID 200XHI (Laser) ×1 IMPLANT
TRACTIP FLEXIVA PULSE ID 200 (Laser) ×2
TUBE FEEDING 8FR 16IN STR KANG (MISCELLANEOUS) ×2 IMPLANT
TUBING CONNECTING 10 (TUBING) ×2 IMPLANT
TUBING UROLOGY SET (TUBING) ×2 IMPLANT

## 2020-05-31 NOTE — Anesthesia Postprocedure Evaluation (Signed)
Anesthesia Post Note  Patient: Millena Callins  Procedure(s) Performed: CYSTOSCOPY WITH RETROGRADE PYELOGRAM, URETEROSCOPY, BASKET EXTRACTION AND STENT PLACEMENT (Left ) HOLMIUM LASER APPLICATION (Left )     Patient location during evaluation: PACU Anesthesia Type: General Level of consciousness: awake and alert Pain management: pain level controlled Vital Signs Assessment: post-procedure vital signs reviewed and stable Respiratory status: spontaneous breathing, nonlabored ventilation and respiratory function stable Cardiovascular status: blood pressure returned to baseline and stable Postop Assessment: no apparent nausea or vomiting Anesthetic complications: no   No complications documented.  Last Vitals:  Vitals:   05/31/20 1815 05/31/20 1824  BP: (!) 165/77 (!) 168/91  Pulse: (!) 57 (!) 59  Resp: 14 16  Temp:    SpO2: 93% 97%    Last Pain:  Vitals:   05/31/20 1824  TempSrc:   PainSc: 0-No pain                 Danie Hannig,W. EDMOND

## 2020-05-31 NOTE — Transfer of Care (Signed)
Immediate Anesthesia Transfer of Care Note  Patient: Kristen Castillo  Procedure(s) Performed: CYSTOSCOPY WITH RETROGRADE PYELOGRAM, URETEROSCOPY, BASKET EXTRACTION AND STENT PLACEMENT (Left ) HOLMIUM LASER APPLICATION (Left )  Patient Location: PACU  Anesthesia Type:General  Level of Consciousness: sedated  Airway & Oxygen Therapy: Patient Spontanous Breathing and Patient connected to face mask oxygen  Post-op Assessment: Report given to RN and Post -op Vital signs reviewed and stable  Post vital signs: Reviewed and stable  Last Vitals:  Vitals Value Taken Time  BP 120/97 05/31/20 1736  Temp    Pulse 63 05/31/20 1737  Resp 22 05/31/20 1737  SpO2 100 % 05/31/20 1737  Vitals shown include unvalidated device data.  Last Pain:  Vitals:   05/31/20 1508  TempSrc: Oral  PainSc:          Complications: No complications documented.

## 2020-05-31 NOTE — Discharge Instructions (Signed)
1 - You may have urinary urgency (bladder spasms) and bloody urine on / off with stent in place. This is normal.  2 - Remove tethered stent on Friday morning at home by pulling on string, then blue-white plastic tubing and discarding. Office is open Friday if any issues arise.   3 - Call MD or go to ER for fever >102, severe pain / nausea / vomiting not relieved by medications, or acute change in medical status  

## 2020-05-31 NOTE — H&P (Signed)
Kristen Castillo is an 80 y.o. female.    Chief Complaint: Pre-op LEFT Ureteroscopic Stone Manipulation  HPI:   1 - Recurrent Urolithiasis -  02/2019 - s/p left ureteroscopy for 39mm stone. Composition 80% Urate / 20% CaOx. Few lower pole stones as well not adressed as ureter very narrow caliber.  05/2019 - left ureteroscpy/tethered stent to stone free  05/2020 - left 73mm UPJ stone with moderate hydro.   PMH sig for abdominal hernia repair with mesh (mesh tacks on aterior abd wall on imaging x several), colon resection (diverticulitis) / colsotomy / reversal, TAH for molar preg. Her PCP is Walker Kehr MD.   Today "Kristen Castillo" is seen to proceed with LEFT ureteroscopic stone manipulation for recurrent left ureteral stone. No interval fevers. Recent UCX negative.   Past Medical History:  Diagnosis Date  . Anxiety   . Complication of anesthesia   . Congestion of both ears   . Deviated nasal septum   . Diverticulitis   . DVT (deep venous thrombosis) ([place])   . Gallstones   . GERD (gastroesophageal reflux disease)   . Hair loss   . Hematuria    in past  . History of creation of ostomy [facility])    past partial colectomy in 05/03/14  . History of kidney stones   . History of skin cancer   . Kidney cysts   . Migraines    in past  . Molar pregnancy   . Molar pregnancy   . Osteoarthritis    fingers   . Osteoporosis   . Osteoporosis   . Persistent cough   . Pneumonia 2019  . PONV (postoperative nausea and vomiting)    sensitive to medications  . Skin cancer   . Unspecified vitamin D deficiency   . Ventral hernia     Past Surgical History:  Procedure Laterality Date  . ABDOMINAL HYSTERECTOMY  1972  . CYSTOSCOPY WITH RETROGRADE PYELOGRAM, URETEROSCOPY AND STENT PLACEMENT Left 05/21/2019   Procedure: CYSTOSCOPY WITH RETROGRADE PYELOGRAM, URETEROSCOPY AND STENT PLACEMENT;  Surgeon: Alexis Frock, MD;  Location: [facility];  Service: Urology;  Laterality: Left;  1 HR  .  CYSTOSCOPY/URETEROSCOPY/HOLMIUM LASER/STENT PLACEMENT Left 02/26/2019   Procedure: CYSTOSCOPY/URETEROSCOPY/HOLMIUM LASER/STENT PLACEMENT FIRST STAGE;  Surgeon: Alexis Frock, MD;  Location: [facility];  Service: Urology;  Laterality: Left;  1 HR  . EYE SURGERY    . FACIAL COSMETIC SURGERY  2009   [name] MD in [place]  . HOLMIUM LASER APPLICATION Left 7/98/9211   Procedure: HOLMIUM LASER APPLICATION;  Surgeon: Alexis Frock, MD;  Location: [facility];  Service: Urology;  Laterality: Left;  . ILEO LOOP COLOSTOMY CLOSURE N/A 11/08/2014   Procedure: LAPAROSCOPIC COLOSTOMY CLOSURE WITH PARTIAL COLECTOMY;  Surgeon: [name], MD;  Location: [facility];  Service: General;  Laterality: N/A;  . INSERTION OF MESH N/A 08/01/2015   Procedure: INSERTION OF MESH;  Surgeon: [name], MD;  Location: [facility];  Service: General;  Laterality: N/A;  . LAPAROSCOPIC LYSIS OF ADHESIONS N/A 11/08/2014   Procedure: LAPAROSCOPIC LYSIS OF ADHESIONS FOR 90 MINUTES;  Surgeon: [name], MD;  Location: [facility];  Service: General;  Laterality: N/A;  . LAPAROSCOPIC LYSIS OF ADHESIONS N/A 08/01/2015   Procedure: LAPAROSCOPIC LYSIS OF ADHESIONS;  Surgeon: [name], MD;  Location: [facility];  Service: General;  Laterality: N/A;  . LAPAROSCOPIC PARTIAL COLECTOMY N/A 05/03/2014   Procedure: DIAGNOSTIC LAPAROSCOPY WITH DRAINAGE OF INTRA ABDOMINAL ABSCESS PARTIAL COLECTOMY ;  Surgeon: [name], MD;  Location:  [facility];  Service: General;  Laterality: N/A;  supine position  . LAPAROTOMY N/A 05/03/2014   Procedure: EXPLORATORY LAPAROTOMY WITH HARTMAN PROCEDURE;  Surgeon: [name], MD;  Location: [facility];  Service: General;  Laterality: N/A;  . LIPOMA RESECTION     left scapula area  . OOPHORECTOMY  1976  . REDUCTION MAMMAPLASTY  1994  . RHINOPLASTY  1985  . TONSILLECTOMY    . VENTRAL HERNIA REPAIR N/A 08/01/2015   Procedure: LAPAROSCOPIC VENTRAL INCISIONAL  HERNIA;  Surgeon: [name], MD;  Location: [facility];  Service: General;  Laterality: N/A;    Family History  Problem Relation Age of Onset  . Arthritis Mother   . Colon cancer Mother 42  . Heart disease Father   . Dementia Father   . Diabetes Sister   . Cancer Maternal Grandfather        Lung cancer  . Heart disease Maternal Grandmother   . Stomach cancer Neg Hx    Social History:  reports that she has never smoked. She has never used smokeless tobacco. She reports that she does not drink alcohol and does not use drugs.  Allergies:  Allergies  Allergen Reactions  . Codeine Nausea And Vomiting  . Dilaudid [Hydromorphone Hcl] Other (See Comments)    "too strong - I can't wake up"  . Pedi-Pre Tape Spray [Wound Dressing Adhesive] Itching    Causes redness and blisters Able to tolerate paper tape  . Sulfa Antibiotics Other (See Comments)    Unknown childhood  . Sulfonamide Derivatives Other (See Comments)    Childhood allergy; reaction unknown  . Tape Other (See Comments)    Causes redness and blisters Able to tolerate paper tape  . Hydrocodone-Acetaminophen Rash  . Norco [Hydrocodone-Acetaminophen] Rash    No medications prior to admission.    Results for orders placed or performed during the hospital encounter of 05/30/20 (from the past 48 hour(s))  SARS CORONAVIRUS 2 (TAT 6-24 HRS) Nasopharyngeal Nasopharyngeal Swab     Status: None   Collection Time: 05/30/20  3:11 PM   Specimen: Nasopharyngeal Swab  Result Value Ref Range   SARS Coronavirus 2 NEGATIVE NEGATIVE    Comment: (NOTE) SARS-CoV-2 target nucleic acids are NOT DETECTED.  The SARS-CoV-2 RNA is generally detectable in upper and lower respiratory specimens during the acute phase of infection. Negative results do not preclude SARS-CoV-2 infection, do not rule out co-infections with other pathogens, and should not be used as the sole basis for treatment or other patient management decisions. Negative results must be combined with clinical  observations, patient history, and epidemiological information. The expected result is Negative.  Fact Sheet for Patients: SugarRoll.be  Fact Sheet for Healthcare Providers: https://www.woods-mathews.com/  This test is not yet approved or cleared by the [place] FDA and  has been authorized for detection and/or diagnosis of SARS-CoV-2 by FDA under an Emergency Use Authorization (EUA). This EUA will remain  in effect (meaning this test can be used) for the duration of the COVID-19 declaration under Se ction 564(b)(1) of the Act, 21 U.S.C. section 360bbb-3(b)(1), unless the authorization is terminated or revoked sooner.  Performed at [facility], [place] [address] [place], [place] [address]    No results found.  Review of Systems  Constitutional: Negative for chills and fever.  Genitourinary: Positive for flank pain.  All other systems reviewed and are negative.   Height 5\' 2"  (1.575 m), weight 55.3 kg. Physical Exam Vitals reviewed.  HENT:  Head: Normocephalic.     Nose: Nose normal.  Cardiovascular:     Rate and Rhythm: Normal rate.     Pulses: Normal pulses.  Pulmonary:     Effort: Pulmonary effort is normal.  Abdominal:     General: Abdomen is flat.     Comments: Prior scars w/o recurrent hernias.   Genitourinary:    Comments: Mild left CVAT at present.  Musculoskeletal:        General: Normal range of motion.     Cervical back: Normal range of motion.  Skin:    General: Skin is warm.  Neurological:     General: No focal deficit present.     Mental Status: She is alert.      Assessment/Plan  Proceed as planned with LEFT ureteroscopic stone manipulation with goal of stone free. Risks, benefits, alternatives, expected peri-op course disucssed. Sttrongly consider daily allopurinol given her refractory recurrneces if predominant urate composition again.   Alexis Frock, MD 05/31/2020, 8:01 AM

## 2020-05-31 NOTE — Anesthesia Preprocedure Evaluation (Addendum)
Anesthesia Evaluation  Patient identified by MRN, date of birth, ID band Patient awake    Reviewed: Allergy & Precautions, H&P , NPO status , Patient's Chart, lab work & pertinent test results  History of Anesthesia Complications (+) PONV and history of anesthetic complications  Airway Mallampati: III  TM Distance: >3 FB Neck ROM: Full    Dental no notable dental hx. (+) Teeth Intact, Dental Advisory Given   Pulmonary neg pulmonary ROS,    Pulmonary exam normal breath sounds clear to auscultation       Cardiovascular negative cardio ROS   Rhythm:Regular Rate:Normal  TTE 2017: EF 55-60%, ventricular septum with severe systolicflattening, consistent with RV volume andpressure overload, RV systolic function moderately to severely reduced, mild RAE, mild-moderate TR, PASP moderately increased 53 mmHg   Neuro/Psych  Headaches, Anxiety    GI/Hepatic Neg liver ROS, GERD  ,  Endo/Other  negative endocrine ROS  Renal/GU Renal diseaseRenal stones  negative genitourinary   Musculoskeletal  (+) Arthritis ,   Abdominal   Peds  Hematology  (+) Blood dyscrasia, anemia ,   Anesthesia Other Findings Day of surgery medications reviewed with patient.  Reproductive/Obstetrics negative OB ROS                           Anesthesia Physical Anesthesia Plan  ASA: III  Anesthesia Plan: General   Post-op Pain Management:    Induction: Intravenous  PONV Risk Score and Plan: 4 or greater and Treatment may vary due to age or medical condition, Ondansetron, Dexamethasone and TIVA  Airway Management Planned: LMA  Additional Equipment: None  Intra-op Plan:   Post-operative Plan: Extubation in OR  Informed Consent: I have reviewed the patients History and Physical, chart, labs and discussed the procedure including the risks, benefits and alternatives for the proposed anesthesia with the patient or authorized  representative who has indicated his/her understanding and acceptance.     Dental advisory given  Plan Discussed with: CRNA  Anesthesia Plan Comments:       Anesthesia Quick Evaluation

## 2020-05-31 NOTE — Anesthesia Procedure Notes (Signed)
Procedure Name: LMA Insertion Date/Time: 05/31/2020 4:36 PM Performed by: Lind Covert, CRNA Pre-anesthesia Checklist: Patient identified, Emergency Drugs available, Suction available, Patient being monitored and Timeout performed Patient Re-evaluated:Patient Re-evaluated prior to induction Oxygen Delivery Method: Circle system utilized Preoxygenation: Pre-oxygenation with 100% oxygen Induction Type: IV induction LMA: LMA inserted LMA Size: 4.0 Tube type: Oral Number of attempts: 1 Placement Confirmation: positive ETCO2 and breath sounds checked- equal and bilateral Tube secured with: Tape

## 2020-05-31 NOTE — Brief Op Note (Signed)
05/31/2020  5:26 PM  PATIENT:  Kristen Castillo  80 y.o. female  PRE-OPERATIVE DIAGNOSIS:  LEFT PROXIMAL URETERAL CALCULI  POST-OPERATIVE DIAGNOSIS:  LEFT PROXIMAL URETERAL CALCULI  PROCEDURE:  Procedure(s) with comments: CYSTOSCOPY WITH RETROGRADE PYELOGRAM, URETEROSCOPY, BASKET EXTRACTION AND STENT PLACEMENT (Left) - 1 HR HOLMIUM LASER APPLICATION (Left)  SURGEON:  Surgeon(s) and Role:    Alexis Frock, MD - Primary  PHYSICIAN ASSISTANT:   ASSISTANTS: none   ANESTHESIA:   general  EBL:  minimal   BLOOD ADMINISTERED:none  DRAINS: none   LOCAL MEDICATIONS USED:  NONE  SPECIMEN:  Source of Specimen:  LEFT ureteral and renal stone fragments  DISPOSITION OF SPECIMEN:  [facility] for compositional analysis  COUNTS:  YES  TOURNIQUET:  * No tourniquets in log *  DICTATION: .Other Dictation: Dictation Number [id number]  PLAN OF CARE: Discharge to home after PACU  PATIENT DISPOSITION:  PACU - hemodynamically stable.   Delay start of Pharmacological VTE agent (>24hrs) due to surgical blood loss or risk of bleeding: yes

## 2020-06-01 ENCOUNTER — Telehealth: Payer: Self-pay | Admitting: Emergency Medicine

## 2020-06-01 ENCOUNTER — Encounter (HOSPITAL_COMMUNITY): Payer: Self-pay | Admitting: Urology

## 2020-06-01 DIAGNOSIS — N202 Calculus of kidney with calculus of ureter: Secondary | ICD-10-CM | POA: Diagnosis not present

## 2020-06-01 NOTE — Telephone Encounter (Signed)
ATC pt. Left detailed message to call back if pt needs anything further. Pt had lithotripsy with left urteral stent placement on 3/23 for renal stone. Pt was taken off Eliquis on 3/22-3/23 and resumed on 3/24.  Per message intake, pt wanted to thank Dr. Lamonte Sakai. Will forward to Dr. Lamonte Sakai to let him know. Nothing further needed.

## 2020-06-01 NOTE — Op Note (Signed)
NAME: Kristen Castillo, DANDY MEDICAL RECORD NO: [id number] ACCOUNT NO: [id number] DATE OF BIRTH: [birth year] FACILITY: [facility] LOCATION: [facility] PHYSICIAN: Alexis Frock, MD  Operative Report   DATE OF SERVICE 05/31/2020   PREOPERATIVE DIAGNOSIS:  Left ureteral and renal stones.  PROCEDURE PERFORMED: 1.  Cystoscopy with left retrograde pyelogram, interpretation. 2.  Left ureteroscopy with laser lithotripsy. 3.  Insertion of left ureteral stent, 5 x 24 Polaris with tether.  ESTIMATED BLOOD LOSS:  Nil.  COMPLICATIONS:  None.  SPECIMEN:  Left ureteral and renal stone fragments for analysis.  FINDINGS: 1.  Left proximal ureteral stone with moderate hydronephrosis. 2.  Left lower pole renal stone. 3.  Complete resolution of all accessible stone fragments larger than 130 mm following laser lithotripsy and basket extraction. 4.  Successful placement of left ureteral stent, proximal end in the renal pelvis, distal end in the urinary bladder.  INDICATIONS FOR PROCEDURE:  The patient is a pleasant 80 year old woman with a history of recurrent urolithiasis.  She has been very compliant with surveillance medical therapy, but her recurrence remains problematic.  She was found on workup for colicky  flank pain and nausea to have a fairly large left proximal ureteral stone on the right, 8 mm as well as several lower pole renal stones.  Options were discussed for management including medical therapy versus shockwave lithotripsy versus ureteroscopy  and she wished to proceed with the latter with a goal of stone free.  Informed consent was obtained and placed in the medical record.  PROCEDURE IN DETAIL:  The patient being identified as herself and the procedure being left ureteroscopic stone reduction was confirmed.  Procedure timeout was performed and intravenous antibiotics were administered and general LMA anesthesia induced.   The patient was placed into a low lithotomy position.  Sterile filed was created,  prepped and draped the patient's vagina, introitus and proximal thighs using iodine.  Cystourethroscopy was performed using a 21-French rigid cystoscope with offset lens.   Inspection of the urinary bladder revealed no diverticula, calcifications, papillary lesions.  The left ureteral orifice was cannulated with a 6-French end-hole catheter and left retrograde pyelogram was obtained.  Left retrograde pyelogram was a single left ureter with single system left kidney.  There was moderate hydronephrosis to a filling defect in the proximal ureter consistent with known stone.  A 0.038 ZIPwire was advanced to the  set aside as a safety  wire.  An 8-French feeding tube placed in the urinary bladder for pressure release and semi-rigid ureteroscopy was performed of the distal four-fifths of left ureter alongside a separate sensor working wire.  This was then exchanged for a 12/14 short  length ureteral access sheath at the level of the proximal ureter using continuous fluoroscopic guidance and flexible digital ureteroscopy was performed of the proximal left ureter.  This did reveal a UPJ stone as anticipated.  Fortunately impaction was  minimal, it was retrograde positioned to the level of the kidney to the mid pole calix.  The stone was much too large for simple basketing and as such holmium laser energy applied at 70 settings of 0.2 joules and 20 Hz.  Approximately 60% of the stone  volume was dusted, 40% fragmented.  The fragments being amenable to simple basketing with the escape basket.  There was additional focus of stone in the lower pole at approximately 6 mm with some milk of calcium and calcifications there.  The dominant  stone was retrograde positioned to an upper mid calix and holmium laser  energy was then applied to it in similar fashion and approximately 60% was dusted, 40% fragmented with the fragments removed with the escape basket.  Following this, complete  resolution of all accessible stone  fragments larger than 130 mm.  There was excellent hemostasis.  No evidence of intrarenal perforation.  Access sheath was removed under continuous vision, no significant mucosal abnormalities were found.  Given the  significant volume of stone and dusting technique, it was felt that brief interval stenting with the tethered stent would be most prudent, as such a new 5 x 24 Polaris type stent was placed over the safety wire using fluoroscopic guidance.  Good proximal  and distal plane were noted.  The tether was trimmed to length and tucked per vagina.  The procedure was terminated.  The patient tolerated procedure well, no immediate apparent complications.  The patient was taken to postanesthesia care unit in stable  condition.  Plan for discharge home.   NIK D: 05/31/2020 5:31:05 pm T: 06/01/2020 6:05:00 am  JOB: [id number]/ [id number]

## 2020-06-02 DIAGNOSIS — N202 Calculus of kidney with calculus of ureter: Secondary | ICD-10-CM | POA: Diagnosis not present

## 2020-06-05 DIAGNOSIS — H35352 Cystoid macular degeneration, left eye: Secondary | ICD-10-CM | POA: Diagnosis not present

## 2020-06-05 DIAGNOSIS — H35371 Puckering of macula, right eye: Secondary | ICD-10-CM | POA: Diagnosis not present

## 2020-06-05 DIAGNOSIS — H43811 Vitreous degeneration, right eye: Secondary | ICD-10-CM | POA: Diagnosis not present

## 2020-06-05 DIAGNOSIS — H3540 Unspecified peripheral retinal degeneration: Secondary | ICD-10-CM | POA: Diagnosis not present

## 2020-06-08 DIAGNOSIS — N281 Cyst of kidney, acquired: Secondary | ICD-10-CM | POA: Diagnosis not present

## 2020-06-08 DIAGNOSIS — N202 Calculus of kidney with calculus of ureter: Secondary | ICD-10-CM | POA: Diagnosis not present

## 2020-06-16 ENCOUNTER — Telehealth: Payer: Self-pay | Admitting: Internal Medicine

## 2020-06-16 ENCOUNTER — Other Ambulatory Visit: Payer: Self-pay

## 2020-06-16 ENCOUNTER — Ambulatory Visit (INDEPENDENT_AMBULATORY_CARE_PROVIDER_SITE_OTHER): Payer: Medicare Other

## 2020-06-16 DIAGNOSIS — M810 Age-related osteoporosis without current pathological fracture: Secondary | ICD-10-CM | POA: Diagnosis not present

## 2020-06-16 MED ORDER — DENOSUMAB 60 MG/ML ~~LOC~~ SOSY
60.0000 mg | PREFILLED_SYRINGE | Freq: Once | SUBCUTANEOUS | Status: AC
Start: 2020-06-16 — End: 2020-06-16
  Administered 2020-06-16: 60 mg via SUBCUTANEOUS

## 2020-06-16 NOTE — Progress Notes (Addendum)
Pt here for Prolia injection per Dr, Shellee Milo.  Prolia given Sub Q in left arm, and pt tolerated injection well.  Next Prolia injection scheduled for 12/18/20.

## 2020-06-16 NOTE — Telephone Encounter (Signed)
Contacted [employer]  No auth required for [place] Name: Kristen Castillo Ref# [id number] 06/16/2020  $250 copay

## 2020-06-20 DIAGNOSIS — L718 Other rosacea: Secondary | ICD-10-CM | POA: Diagnosis not present

## 2020-06-20 DIAGNOSIS — Z85828 Personal history of other malignant neoplasm of skin: Secondary | ICD-10-CM | POA: Diagnosis not present

## 2020-07-10 DIAGNOSIS — H35352 Cystoid macular degeneration, left eye: Secondary | ICD-10-CM | POA: Diagnosis not present

## 2020-07-11 ENCOUNTER — Encounter: Payer: Self-pay | Admitting: Emergency Medicine

## 2020-07-11 ENCOUNTER — Ambulatory Visit: Payer: Medicare Other | Admitting: Emergency Medicine

## 2020-07-11 ENCOUNTER — Other Ambulatory Visit: Payer: Self-pay

## 2020-07-11 DIAGNOSIS — R059 Cough, unspecified: Secondary | ICD-10-CM | POA: Diagnosis not present

## 2020-07-11 DIAGNOSIS — I2692 Saddle embolus of pulmonary artery without acute cor pulmonale: Secondary | ICD-10-CM | POA: Diagnosis not present

## 2020-07-11 MED ORDER — APIXABAN 2.5 MG PO TABS: 2.5000 mg | ORAL_TABLET | Freq: Two times a day (BID) | ORAL | 11 refills | Status: DC

## 2020-07-11 NOTE — Patient Instructions (Addendum)
We will plan to continue your Eliquis 2.5 mg twice a day.  As long as you are tolerating the medication we will plan to keep it for clot prevention. Continue Pepcid, Rhinocort, Tessalon Perles as you have been using. Follow with Dr. Lamonte Sakai in 12 months or sooner if you have any problems.

## 2020-07-11 NOTE — Assessment & Plan Note (Signed)
Her PAP and right heart function are improved following treatment with anticoagulation.  It was a provoked clot and she feels more comfortable remaining on the prevention dose of Eliquis.  We will plan to continue 2.5 mg twice daily as long as she is tolerating, is not having any complications.

## 2020-07-11 NOTE — Progress Notes (Signed)
   Subjective:    Patient ID: Kristen Castillo, female    DOB: 06/09/1940, 80 y.o.   MRN: [id number]  HPI  ROV 06/16/2019 --follow-up visit for 80 year old woman with a history of large pulmonary embolism following bowel surgery, mesh placement.  She underwent targeted lysis and had improvement in right heart strain on echocardiogram.  We have been following her now on Eliquis 2.5 mg twice daily.  She also has chronic cough in the setting of GERD and we have been treating with famotidine. She uses tessalon at night. She is also on Rhinocort.   ROV 07/11/20 --follow-up visit for 80 year old woman, never smoker, whom I have followed for her history of a large pulmonary embolism.  This was a provoked clot that occurred when she had a bowel surgery and mesh placement.  She underwent targeted lysis and had resolution of right heart strain by echocardiogram.  We have been maintaining her on Eliquis 2.5 mg twice daily.  She also has chronic cough in the setting of GERD and allergic rhinitis. Seems to be a bit worse in allergy season. She uses tessalon perles prn.    Review of Systems As per HPI     Objective:   Physical Exam Vitals:   07/11/20 1050  BP: 136/76  Pulse: 80  Temp: 97.7 F (36.5 C)  TempSrc: Temporal  SpO2: 96%  Weight: 124 lb 6.4 oz (56.4 kg)  Height: 5\' 2"  (1.575 m)    Gen: Pleasant, well-nourished, in no distress,  normal affect  ENT: No lesions,  mouth clear,  oropharynx clear, no postnasal drip, strong voice  Neck: No JVD, no stridor  Lungs: No use of accessory muscles, clear B, no wheeze or crackles.   Cardiovascular: RRR, heart sounds normal, no murmur or gallops, no edema  Musculoskeletal: No deformities, no cyanosis or clubbing  Neuro: alert, non focal  Skin: Warm, no lesions or rashes      Assessment & Plan:  Pulmonary embolism (HCC) Her PAP and right heart function are improved following treatment with anticoagulation.  It was a provoked clot and she  feels more comfortable remaining on the prevention dose of Eliquis.  We will plan to continue 2.5 mg twice daily as long as she is tolerating, is not having any complications.  Cough Doing well, good control as long as she is managing her GERD, rhinitis.  She benefits from [employer]  Continue Pepcid, Rhinocort, Tessalon Perles as you have been using. Follow with Dr. Lamonte Sakai in 12 months or sooner if you have any problems.   Baltazar Apo, MD, PhD 07/11/2020, 11:10 AM  [facility] [phone] or if no answer [phone]

## 2020-07-11 NOTE — Addendum Note (Signed)
Addended by: Gavin Potters R on: 07/11/2020 11:24 AM   Modules accepted: Orders

## 2020-07-11 NOTE — Assessment & Plan Note (Signed)
Doing well, good control as long as she is managing her GERD, rhinitis.  She benefits from [employer]  Continue Pepcid, Rhinocort, Tessalon Perles as you have been using. Follow with Dr. Lamonte Sakai in 12 months or sooner if you have any problems.

## 2020-07-13 DIAGNOSIS — D692 Other nonthrombocytopenic purpura: Secondary | ICD-10-CM | POA: Diagnosis not present

## 2020-07-13 DIAGNOSIS — Z85828 Personal history of other malignant neoplasm of skin: Secondary | ICD-10-CM | POA: Diagnosis not present

## 2020-07-25 DIAGNOSIS — H524 Presbyopia: Secondary | ICD-10-CM | POA: Diagnosis not present

## 2020-08-01 ENCOUNTER — Ambulatory Visit (INDEPENDENT_AMBULATORY_CARE_PROVIDER_SITE_OTHER): Payer: Medicare Other | Admitting: Psychology

## 2020-08-01 DIAGNOSIS — F4320 Adjustment disorder, unspecified: Secondary | ICD-10-CM | POA: Diagnosis not present

## 2020-08-07 ENCOUNTER — Other Ambulatory Visit: Payer: Self-pay | Admitting: Internal Medicine

## 2020-08-07 DIAGNOSIS — Z7189 Other specified counseling: Secondary | ICD-10-CM

## 2020-08-07 DIAGNOSIS — R059 Cough, unspecified: Secondary | ICD-10-CM

## 2020-08-10 IMAGING — US US ABDOMEN COMPLETE
1 series · 13 of 25 positions shown · non-contrast
Comparison: CT abdomen pelvis 04/21/2019

CLINICAL DATA: Bloating, generalized abdominal pain, chronic renal
insufficiency

EXAM:
ABDOMEN ULTRASOUND COMPLETE

[Series 1: us abdomen complete · 0.28mm/px · 13 of 117 slices shown]
[im 1/117]
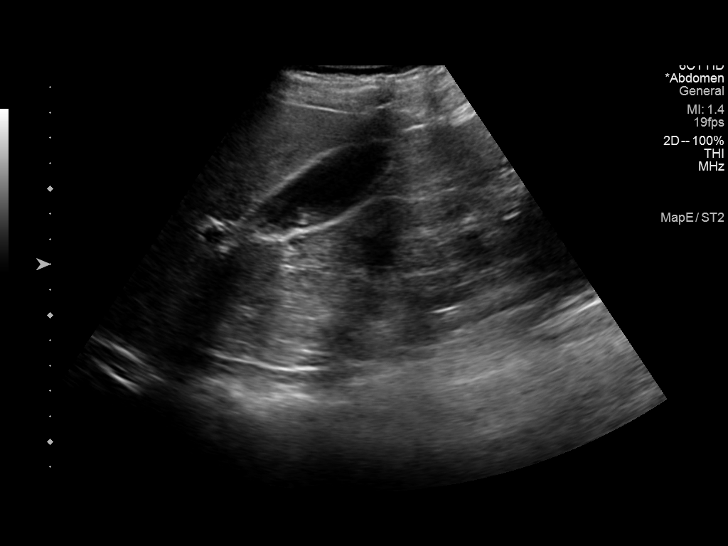
[im 10/117]
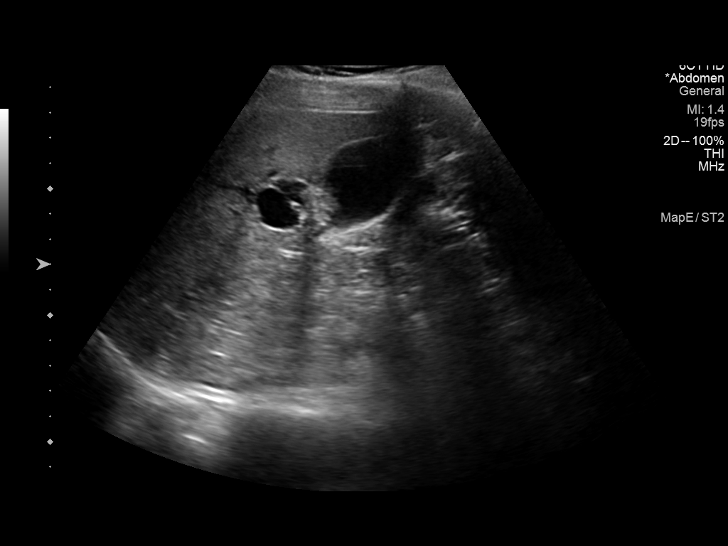
[im 20/117]
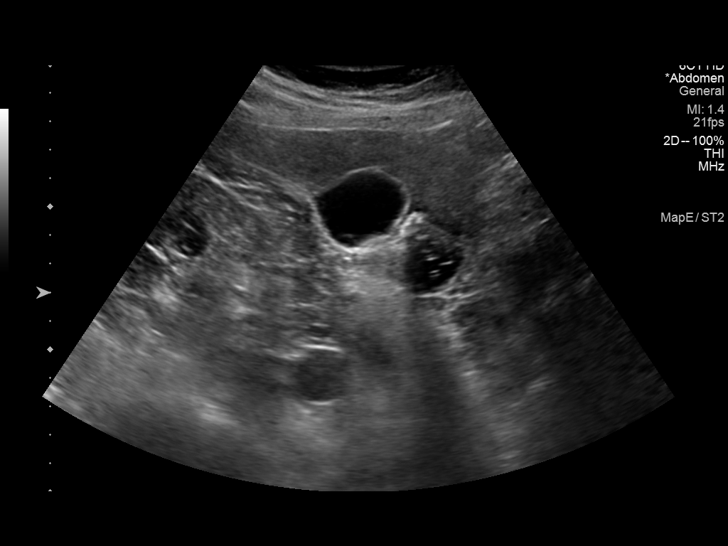
[im 30/117]
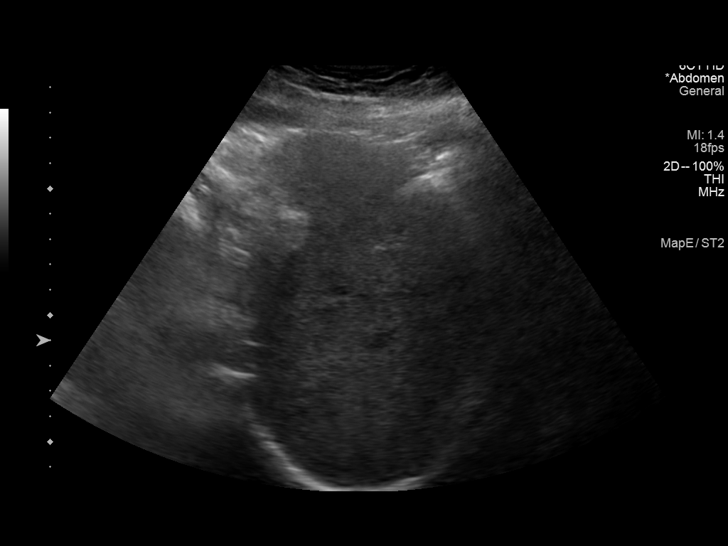
[im 39/117]
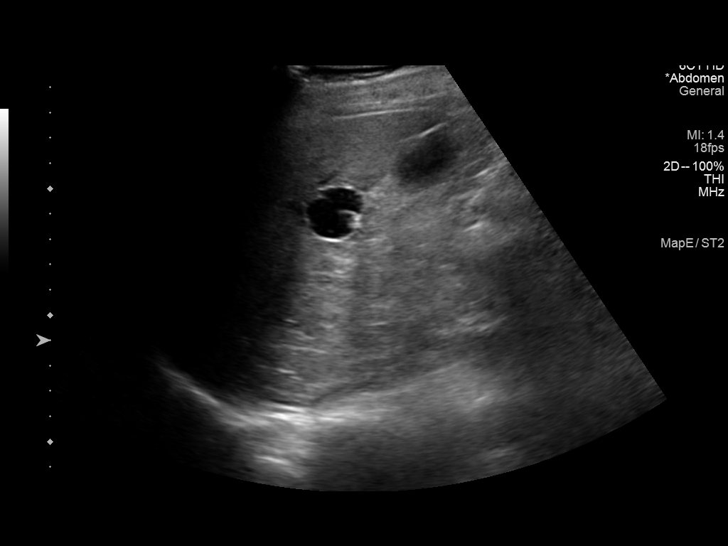
[im 49/117]
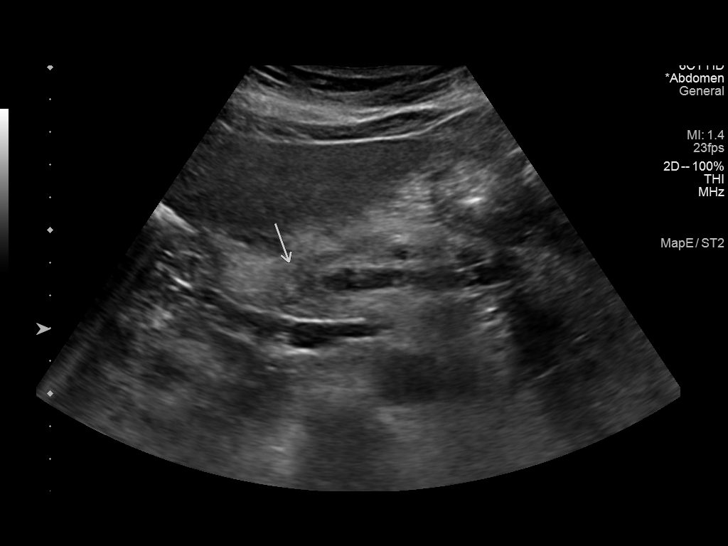
[im 59/117]
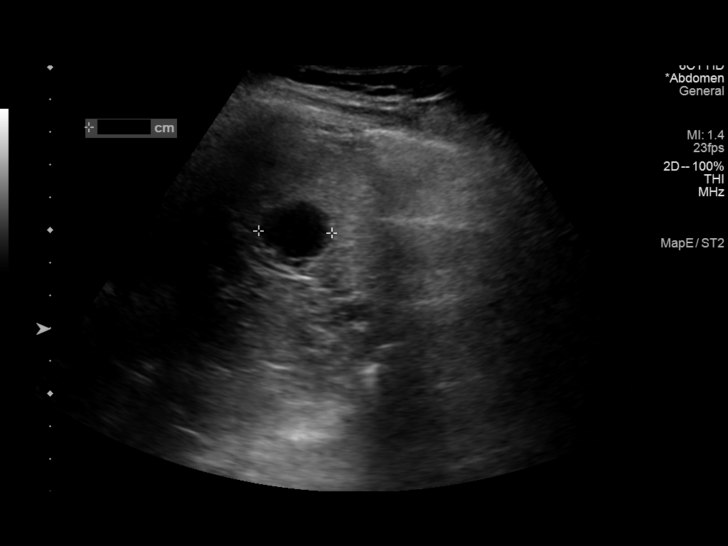
[im 68/117]
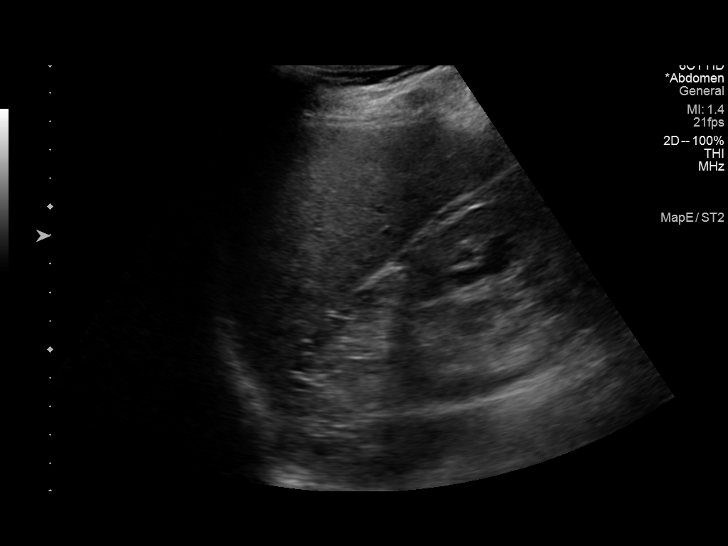
[im 78/117]
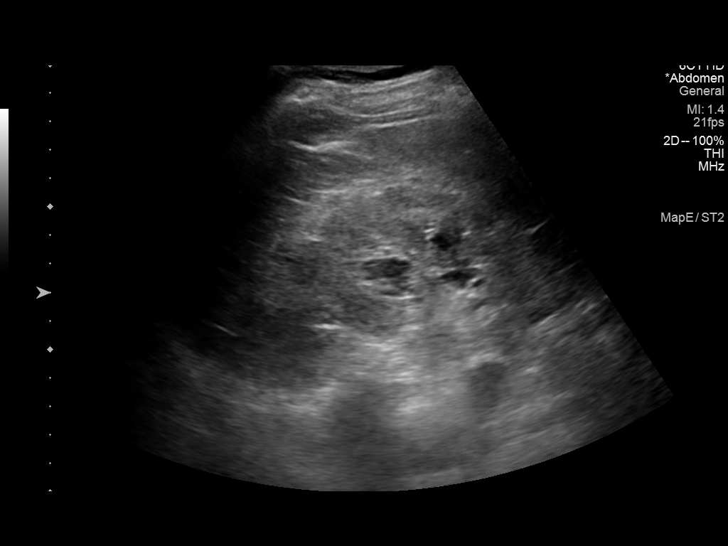
[im 88/117]
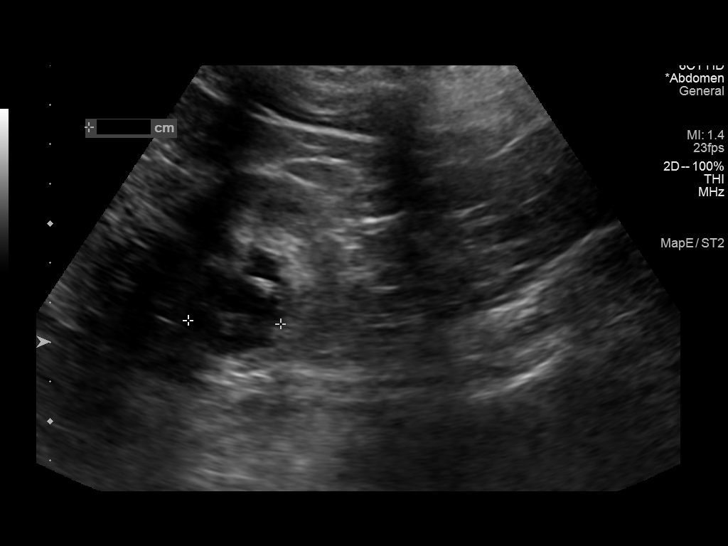
[im 97/117]
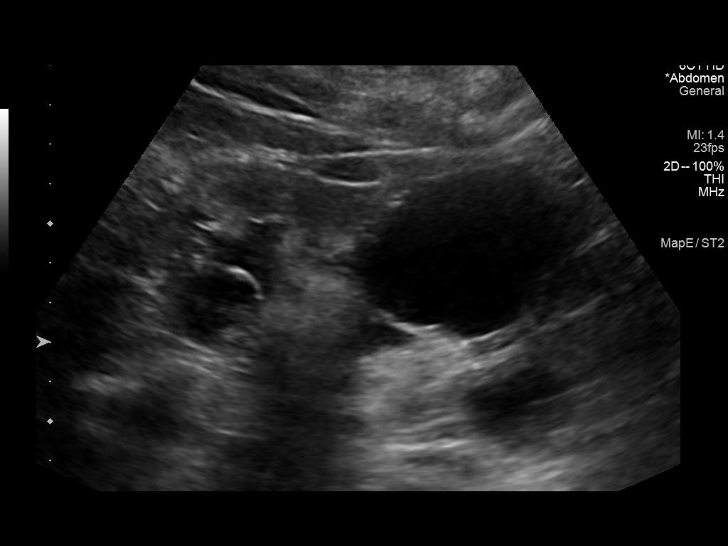
[im 107/117]
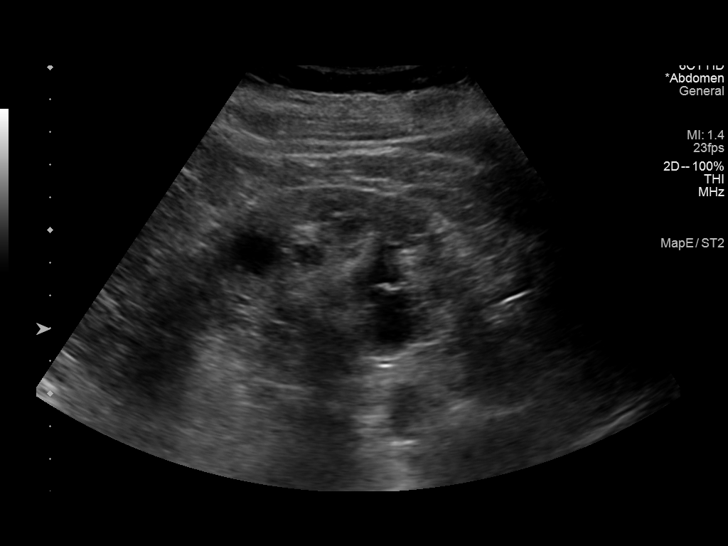
[im 117/117]
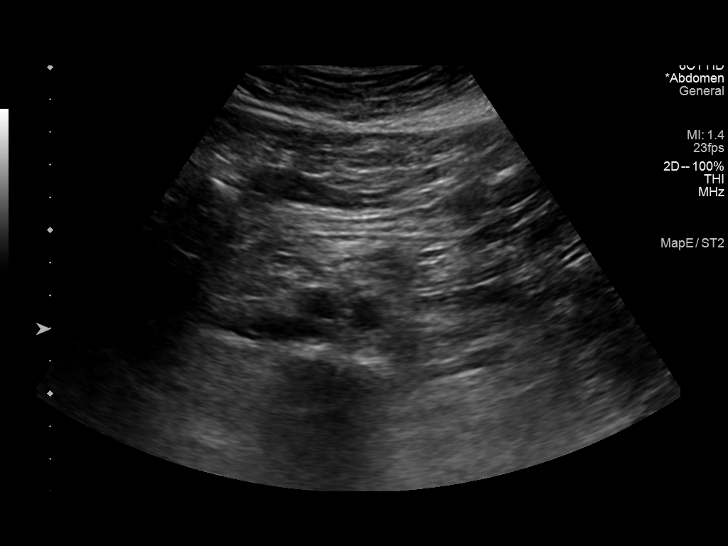

[13 of 25 positions shown; findings below may reference images not displayed]

FINDINGS: Gallbladder: Multiple dependent tiny calculi in gallbladder.
Additional intraluminal echogenic focus within gallbladder 17 x 16 x
17 mm, demonstrating mild shadowing posteriorly on transverse image
(image 19), without internal blood flow on color Doppler imaging,
favor calculus. Minimally thickened gallbladder wall. Sonographic
Murphy sign present. Findings concerning for acute cholecystitis.

Common bile duct: Diameter: 7 mm, which may be normal for age

Liver: Upper normal echogenicity. Complex cystic lesion RIGHT lobe
2.7 x 2.4 x 2.6 cm increased in size since prior CT when this
measured 2.1 x 2.1 x 1.9 cm. No additional masses. Portal vein is
patent on color Doppler imaging with normal direction of blood flow
towards the liver.

IVC: Normal appearance

Pancreas: Slightly prominent pancreatic duct 5 mm diameter.
Heterogeneous pancreatic head, unable to exclude mass lesion. No
shadowing calcifications.

Spleen: Normal appearance, 6.3 cm length

Right Kidney: Length: 11.5 cm. Cortical thinning. Increased cortical
echogenicity. Numerous cystic lesions including a 6.7 x 6.1 x 6.2 cm
diameter simple cyst at inferior pole as well as multiple peripelvic
cysts. Solid-appearing lesion at upper pole 2.1 x 2.0 x 1.9 cm, not
seen on prior CT. No definite hydronephrosis.

Left Kidney: Length: 11.1 cm. Multiple cysts including a large
simple cyst at inferior pole 5.0 x 4.3 x 5.0 cm and a complex cystic
lesion at upper to mid LEFT kidney 2.3 x 2.3 x 2.6 cm. A more
echogenic area 2.3 cm diameter at mid kidney may represent renal
sinus fat. No hydronephrosis.

Abdominal aorta: Normal caliber

Other findings: No free fluid
IMPRESSION: Cholelithiasis with gallbladder wall thickening and potential
sonographic Murphy sign suspicious for acute cholecystitis.

Complex cystic lesions of the liver and LEFT kidney as well as a
solid-appearing lesion at upper pole of RIGHT kidney.

Recommend characterization of these lesions by MR imaging,
with/without contrast if patient's renal function permits, otherwise
without contrast.

Mild pancreatic ductal dilatation and inhomogeneity of the
pancreatic head not identified on prior CT though the prior CT was a
noncontrast study, cannot exclude pancreatic head mass; recommend
this be evaluated simultaneously at time of MR.

These results will be called to the ordering clinician or
representative by the Radiologist Assistant, and communication
documented in the PACS or [REDACTED].

## 2020-08-24 DIAGNOSIS — D1801 Hemangioma of skin and subcutaneous tissue: Secondary | ICD-10-CM | POA: Diagnosis not present

## 2020-08-24 DIAGNOSIS — Z85828 Personal history of other malignant neoplasm of skin: Secondary | ICD-10-CM | POA: Diagnosis not present

## 2020-08-24 DIAGNOSIS — I788 Other diseases of capillaries: Secondary | ICD-10-CM | POA: Diagnosis not present

## 2020-09-05 ENCOUNTER — Telehealth: Payer: Self-pay | Admitting: Emergency Medicine

## 2020-09-05 ENCOUNTER — Other Ambulatory Visit: Payer: Self-pay | Admitting: Internal Medicine

## 2020-09-05 DIAGNOSIS — Z7189 Other specified counseling: Secondary | ICD-10-CM

## 2020-09-05 DIAGNOSIS — R059 Cough, unspecified: Secondary | ICD-10-CM

## 2020-09-05 DIAGNOSIS — I82401 Acute embolism and thrombosis of unspecified deep veins of right lower extremity: Secondary | ICD-10-CM

## 2020-09-05 DIAGNOSIS — M7989 Other specified soft tissue disorders: Secondary | ICD-10-CM

## 2020-09-05 NOTE — Telephone Encounter (Signed)
Please order bilateral dopplers, re: hx DVT/ right leg swelling

## 2020-09-05 NOTE — Telephone Encounter (Signed)
Called and spoke with patient. She verbalized understanding. Order for doppler has been placed. Nothing further needed at time of call.

## 2020-09-05 NOTE — Telephone Encounter (Signed)
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  It was a provoked clot and she feels more comfortable remaining on the prevention dose of Eliquis.  We will plan to continue 2.5 mg twice daily as long as she is tolerating, is not having any complications.   Cough Doing well, good control as long as she is managing her GERD, rhinitis.  She benefits from [employer]   Continue Pepcid, Rhinocort, Tessalon Perles as you have been using. Follow with Dr. Lamonte Sakai in 12 months or sooner if you have any problems.    Reason for call: Pt states that she is having "tightness" behind right knee that has lasted 2-3 day without relief. Pt has a hx of right DVT 5 years ago and is concerned that she may have developed another clot. Pt is currently taking Eliquis 2.5 BID. Benjamine Mola will you please advise d/t Dr. Lamonte Sakai being out of office. Pt states no increased SOB, cough, N/V/D/F/C.  (examples of things to ask: : When did symptoms start? Fever? Cough? Productive? Color to sputum? More sputum than usual? Wheezing? Have you needed increased oxygen? Are you taking your respiratory medications? What over the counter measures have you tried?)  Allergies  Allergen Reactions   Codeine Nausea And Vomiting   Dilaudid [Hydromorphone Hcl] Other (See Comments)    "too strong - I can't wake up"   Pedi-Pre Tape Spray [Wound Dressing Adhesive] Itching    Causes redness and blisters Able to tolerate paper tape   Sulfa Antibiotics Other (See Comments)    Unknown childhood   Sulfonamide Derivatives Other (See Comments)    Childhood allergy; reaction unknown   Tape Other (See Comments)    Causes redness and  blisters Able to tolerate paper tape   Hydrocodone-Acetaminophen Rash   Norco [Hydrocodone-Acetaminophen] Rash    Immunization History  Administered Date(s) Administered   Fluad Quad(high Dose 65+) 11/03/2018, 12/10/2019   H1N1 04/16/2008   Influenza Whole 12/25/2009, 12/09/2011   Influenza, High Dose Seasonal PF 12/26/2015, 01/01/2017   Influenza,inj,Quad PF,6+ Mos 11/25/2014, 12/04/2017   Influenza-Unspecified 03/01/2014   Moderna Sars-Covid-2 Vaccination 03/20/2019, 04/17/2019, 12/29/2019   Pneumococcal Conjugate-13 04/14/2013   Pneumococcal Polysaccharide-23 03/11/2002, 12/25/2009, 09/16/2017   Td 02/09/2009   Tdap 08/26/2019   Zoster Recombinat (Shingrix) 05/20/2018, 08/26/2018   Zoster, Live 02/17/2009

## 2020-09-06 ENCOUNTER — Telehealth: Payer: Self-pay | Admitting: Primary Care

## 2020-09-06 ENCOUNTER — Other Ambulatory Visit: Payer: Self-pay

## 2020-09-06 ENCOUNTER — Ambulatory Visit (HOSPITAL_COMMUNITY)
Admission: RE | Admit: 2020-09-06 | Discharge: 2020-09-06 | Disposition: A | Discharge status: Home / Self Care | Payer: Medicare Other | Source: Ambulatory Visit | Attending: Primary Care | Admitting: Primary Care

## 2020-09-06 ENCOUNTER — Telehealth: Payer: Self-pay | Admitting: Internal Medicine

## 2020-09-06 DIAGNOSIS — M7989 Other specified soft tissue disorders: Secondary | ICD-10-CM | POA: Diagnosis not present

## 2020-09-06 DIAGNOSIS — I82401 Acute embolism and thrombosis of unspecified deep veins of right lower extremity: Secondary | ICD-10-CM | POA: Insufficient documentation

## 2020-09-06 MED ORDER — PAXLOVID 20 X 150 MG & 10 X 100MG PO TBPK: 3.0000 | ORAL_TABLET | Freq: Two times a day (BID) | ORAL | 0 refills | Status: DC

## 2020-09-06 NOTE — Telephone Encounter (Signed)
Patient is returning phone call. Patient phone number is [phone].

## 2020-09-06 NOTE — Telephone Encounter (Signed)
Notified husband rx has [facility]

## 2020-09-06 NOTE — Telephone Encounter (Signed)
Patient's husband wants to change the pharmacy to [facility] instead of [facility]. He is asking to be notified once the order is put in.   Preferred Pharmacy:  [id number] [place], [place] Phone:  [phone]  Fax:  [phone]

## 2020-09-06 NOTE — Telephone Encounter (Signed)
Rx emailed. GFR 42 Thx

## 2020-09-06 NOTE — Telephone Encounter (Addendum)
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 Bakers cyst right- refer to orthopedics or she can possibly see her PCP

## 2020-09-06 NOTE — Telephone Encounter (Signed)
Patient has just tested positive for covid and is wondering is she can get PAXLOVID called in to   [place], [place]

## 2020-09-06 NOTE — Telephone Encounter (Signed)
Received call from pt. Informed her of the results of the LE venous duplex per Derl Barrow, NP. She verbalized understanding and states she would like message sent to Dr. Alain Marion, PCP, to inform and discuss the Baker's cyst.   Will forward to PCP to make aware per pt's request.

## 2020-09-06 NOTE — Telephone Encounter (Signed)
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   Will forward to [facility] as FYI.

## 2020-09-06 NOTE — Telephone Encounter (Signed)
Attempted to call pt but unable to reach. Left message for her to return call. 

## 2020-09-18 ENCOUNTER — Other Ambulatory Visit: Payer: Medicare Other

## 2020-09-18 DIAGNOSIS — H6123 Impacted cerumen, bilateral: Secondary | ICD-10-CM | POA: Diagnosis not present

## 2020-10-03 ENCOUNTER — Other Ambulatory Visit: Payer: Self-pay | Admitting: Internal Medicine

## 2020-10-09 ENCOUNTER — Ambulatory Visit: Payer: Medicare Other | Admitting: Internal Medicine

## 2020-10-17 ENCOUNTER — Ambulatory Visit (INDEPENDENT_AMBULATORY_CARE_PROVIDER_SITE_OTHER): Payer: Medicare Other | Admitting: Internal Medicine

## 2020-10-17 ENCOUNTER — Other Ambulatory Visit: Payer: Self-pay

## 2020-10-17 ENCOUNTER — Encounter: Payer: Self-pay | Admitting: Internal Medicine

## 2020-10-17 VITALS — BP 134/82 | HR 92 | Temp 97.9°F | Ht 62.0 in | Wt 126.4 lb

## 2020-10-17 DIAGNOSIS — F419 Anxiety disorder, unspecified: Secondary | ICD-10-CM

## 2020-10-17 DIAGNOSIS — Z0000 Encounter for general adult medical examination without abnormal findings: Secondary | ICD-10-CM

## 2020-10-17 DIAGNOSIS — R059 Cough, unspecified: Secondary | ICD-10-CM | POA: Diagnosis not present

## 2020-10-17 DIAGNOSIS — I2692 Saddle embolus of pulmonary artery without acute cor pulmonale: Secondary | ICD-10-CM

## 2020-10-17 DIAGNOSIS — K862 Cyst of pancreas: Secondary | ICD-10-CM

## 2020-10-17 DIAGNOSIS — N1832 Chronic kidney disease, stage 3b: Secondary | ICD-10-CM | POA: Diagnosis not present

## 2020-10-17 MED ORDER — BENZONATATE 200 MG PO CAPS: 200.0000 mg | ORAL_CAPSULE | Freq: Three times a day (TID) | ORAL | 1 refills | Status: DC | PRN

## 2020-10-17 MED ORDER — AZITHROMYCIN 250 MG PO TABS: ORAL_TABLET | ORAL | 0 refills | Status: DC

## 2020-10-17 NOTE — Assessment & Plan Note (Signed)
Hydrate well Obtain Labs - [facility]

## 2020-10-17 NOTE — Progress Notes (Signed)
Subjective:  Patient ID: Kristen Castillo, female    DOB: 04-May-1940  Age: 80 y.o. MRN: [id number]  CC: Follow-up (6 month f/u) and Cough (Pt c/o cough and sore throat. She states she is not coughing up phlegm just a chronic dry cough. She think her throat is sore just from coughing. She states she took 2 covid test both was negative)   HPI Kristen Castillo presents for URI sx x 2 days. COVID (-) x2, renal and pancreatic cysts F/u DVT/PE, anticoagulation  Outpatient Medications Prior to Visit  Medication Sig Dispense Refill   allopurinol (ZYLOPRIM) 100 MG tablet Take 100 mg by mouth 2 (two) times daily.     apixaban (ELIQUIS) 2.5 MG TABS tablet Take 1 tablet (2.5 mg total) by mouth 2 (two) times daily. 60 tablet 11   Bromfenac Sodium (PROLENSA) 0.07 % SOLN Place 1 drop into the left eye daily.     budesonide (RHINOCORT AQUA) 32 MCG/ACT nasal spray Place 1 spray into both nostrils in the morning and at bedtime.      calcium carbonate (OS-CAL) 600 MG TABS tablet Take 600 mg by mouth daily.      denosumab (PROLIA) 60 MG/ML SOLN injection Inject 60 mg into the skin every 6 (six) months. Administer in upper arm, thigh, or abdomen     Difluprednate 0.05 % EMUL Place 1 drop into the left eye 2 (two) times daily.     famotidine (PEPCID) 20 MG tablet Take 20 mg by mouth at bedtime.      LORazepam (ATIVAN) 0.5 MG tablet TAKE 1 TABLET ONCE DAILY AS NEEDED FOR ANXIETY. 30 tablet 3   Multiple Vitamin (MULTIVITAMIN) tablet Take 1 tablet by mouth every morning.     Polyethyl Glycol-Propyl Glycol (SYSTANE OP) Place 1 drop into both eyes 3 (three) times daily as needed (dry eyes).     Probiotic Product (ALIGN) 4 MG CAPS Take 1 capsule (4 mg total) by mouth daily. 30 capsule 0   benzonatate (TESSALON) 100 MG capsule Take 1 capsule (100 mg total) by mouth 2 (two) times daily as needed for cough. TAKE ONE CAPSULE BY MOUTH ONCE DAILY. (Patient not taking: Reported on 10/17/2020) 60 capsule 0   cephALEXin  (KEFLEX) 500 MG capsule Take 1 capsule (500 mg total) by mouth 2 (two) times daily. X 3 days to prevent infection with tethered stent in place. (Patient not taking: Reported on 10/17/2020) 6 capsule 0   ketorolac (TORADOL) 10 MG tablet Take 1 tablet (10 mg total) by mouth every 8 (eight) hours as needed for moderate pain. Or stent discomfort post-operatively (Patient not taking: Reported on 10/17/2020) 20 tablet 0   Nirmatrelvir & Ritonavir (PAXLOVID) 20 x 150 MG & 10 x '100MG'$  TBPK Take 3 tablets by mouth in the morning and at bedtime. As directed on a package (Patient not taking: Reported on 10/17/2020) 30 tablet 0   oxyCODONE-acetaminophen (PERCOCET) 5-325 MG tablet Take 1 tablet by mouth every 6 (six) hours as needed for severe pain. Post-operatively (Patient not taking: No sig reported) 10 tablet 0   senna-docusate (SENOKOT-S) 8.6-50 MG tablet Take 1 tablet by mouth 2 (two) times daily. While taking strongest pain meds to prevent constipaiton. (Patient not taking: No sig reported) 10 tablet 0   No facility-administered medications prior to visit.    ROS: Review of Systems  Constitutional:  Positive for fatigue. Negative for activity change, appetite change, chills and unexpected weight change.  HENT:  Positive for sore throat.  Negative for congestion, mouth sores and sinus pressure.   Eyes:  Negative for visual disturbance.  Respiratory:  Positive for cough. Negative for chest tightness and shortness of breath.   Gastrointestinal:  Negative for abdominal pain and nausea.  Genitourinary:  Negative for difficulty urinating, frequency and vaginal pain.  Musculoskeletal:  Negative for back pain and gait problem.  Skin:  Negative for pallor and rash.  Neurological:  Negative for dizziness, tremors, weakness, numbness and headaches.  Psychiatric/Behavioral:  Negative for confusion and sleep disturbance.    Objective:  BP 134/82 (BP Location: Left Arm)   Pulse 92   Temp 97.9 F (36.6 C) (Oral)   Ht  '5\' 2"'$  (1.575 m)   Wt 126 lb 6.4 oz (57.3 kg)   SpO2 97%   BMI 23.12 kg/m   BP Readings from Last 3 Encounters:  10/17/20 134/82  07/11/20 136/76  05/31/20 (!) 168/91    Wt Readings from Last 3 Encounters:  10/17/20 126 lb 6.4 oz (57.3 kg)  07/11/20 124 lb 6.4 oz (56.4 kg)  05/31/20 124 lb 3.2 oz (56.3 kg)    Physical Exam Constitutional:      General: She is not in acute distress.    Appearance: Normal appearance. She is well-developed.  HENT:     Head: Normocephalic.     Right Ear: External ear normal.     Left Ear: External ear normal.     Nose: Nose normal.  Eyes:     General:        Right eye: No discharge.        Left eye: No discharge.     Conjunctiva/sclera: Conjunctivae normal.     Pupils: Pupils are equal, round, and reactive to light.  Neck:     Thyroid: No thyromegaly.     Vascular: No JVD.     Trachea: No tracheal deviation.  Cardiovascular:     Rate and Rhythm: Normal rate and regular rhythm.     Heart sounds: Normal heart sounds.  Pulmonary:     Effort: No respiratory distress.     Breath sounds: No stridor. No wheezing.  Abdominal:     General: Bowel sounds are normal. There is no distension.     Palpations: Abdomen is soft. There is no mass.     Tenderness: There is no abdominal tenderness. There is no guarding or rebound.  Musculoskeletal:        General: No tenderness.     Cervical back: Normal range of motion and neck supple. No rigidity.  Lymphadenopathy:     Cervical: No cervical adenopathy.  Skin:    Findings: No erythema or rash.  Neurological:     Cranial Nerves: No cranial nerve deficit.     Motor: No abnormal muscle tone.     Coordination: Coordination normal.     Deep Tendon Reflexes: Reflexes normal.  Psychiatric:        Behavior: Behavior normal.        Thought Content: Thought content normal.        Judgment: Judgment normal.    Lab Results  Component Value Date   WBC 10.5 05/31/2020   HGB 12.1 05/31/2020   HCT 37.7  05/31/2020   PLT 292 05/31/2020   GLUCOSE 112 (H) 04/10/2020   CHOL 208 (H) 11/03/2018   TRIG 176.0 (H) 11/03/2018   HDL 55.80 11/03/2018   LDLCALC 117 (H) 11/03/2018   ALT 11 07/29/2019   AST 15 07/29/2019   NA 138 04/10/2020  K 4.4 04/10/2020   CL 105 04/10/2020   CREATININE 1.13 04/10/2020   BUN 38 (H) 04/10/2020   CO2 27 04/10/2020   TSH 2.37 07/12/2019   INR 1.3 (H) 05/11/2019   HGBA1C 6.0 (H) 11/03/2014    VAS [name] LOWER EXTREMITY VENOUS (DVT)  Result Date: 09/06/2020  Lower Venous DVT Study Patient Name:  SHOLANDA [facility]AGNASCO [facility]  Date of Exam:   09/06/2020 Medical Rec #: [id number]            Accession #:    [id number] Date of Birth: March 04, 1941            Patient Gender: F Patient Age:   79Y Exam Location:  [facility] Procedure:      VAS [name] LOWER EXTREMITY VENOUS (DVT) Referring Phys: Pingree --------------------------------------------------------------------------------  Indications: Pain, Swelling, and Edema.  Risk Factors: Confirmed PE DVT History of right leg DVT. Anticoagulation: Eliquis. Performing Technologist: Delorise Shiner RVT  Examination Guidelines: A complete evaluation includes B-mode imaging, spectral Doppler, color Doppler, and power Doppler as needed of all accessible portions of each vessel. Bilateral testing is considered an integral part of a complete examination. Limited examinations for reoccurring indications may be performed as noted. The reflux portion of the exam is performed with the patient in reverse Trendelenburg.  +---------+---------------+---------+-----------+----------------+-------------+ RIGHT    CompressibilityPhasicitySpontaneityProperties      Thrombus                                                                  Aging         +---------+---------------+---------+-----------+----------------+-------------+ CFV      Full           Yes      Yes                                       +---------+---------------+---------+-----------+----------------+-------------+ SFJ      Full           Yes      Yes                                      +---------+---------------+---------+-----------+----------------+-------------+ FV Prox  Full           Yes      Yes                                      +---------+---------------+---------+-----------+----------------+-------------+ FV Mid   Full           Yes      Yes                                      +---------+---------------+---------+-----------+----------------+-------------+ FV DistalFull           Yes      Yes        vein wall       Chronic  thickening                    +---------+---------------+---------+-----------+----------------+-------------+ PFV      Full                    Yes                                      +---------+---------------+---------+-----------+----------------+-------------+ POP      Full           Yes      Yes                                      +---------+---------------+---------+-----------+----------------+-------------+ PTV      Full                    Yes                                      +---------+---------------+---------+-----------+----------------+-------------+ PERO     Full                                                             +---------+---------------+---------+-----------+----------------+-------------+ GSV      Full                    Yes                                      +---------+---------------+---------+-----------+----------------+-------------+ SSV      Full                    Yes                                      +---------+---------------+---------+-----------+----------------+-------------+ Anechoic structure seen in popliteal space measuring 2.0 x 1.3 x 3.0 cm consistent with Baker's cyst. Reflux seen in right popliteal vein.   +---------+---------------+---------+-----------+----------+--------------+ LEFT     CompressibilityPhasicitySpontaneityPropertiesThrombus Aging +---------+---------------+---------+-----------+----------+--------------+ CFV      Full           Yes      Yes                                 +---------+---------------+---------+-----------+----------+--------------+ SFJ      Full           Yes      Yes                                 +---------+---------------+---------+-----------+----------+--------------+ FV Prox  Full           Yes      Yes                                 +---------+---------------+---------+-----------+----------+--------------+  FV Mid   Full           Yes      Yes                                 +---------+---------------+---------+-----------+----------+--------------+ FV DistalFull           Yes      Yes                                 +---------+---------------+---------+-----------+----------+--------------+ PFV      Full           Yes                                          +---------+---------------+---------+-----------+----------+--------------+ POP      Full           Yes      Yes                                 +---------+---------------+---------+-----------+----------+--------------+ PTV      Full           Yes                                          +---------+---------------+---------+-----------+----------+--------------+ PERO     Full                                                        +---------+---------------+---------+-----------+----------+--------------+ GSV      Full           Yes                                          +---------+---------------+---------+-----------+----------+--------------+ SSV      Full           Yes                                          +---------+---------------+---------+-----------+----------+--------------+    Findings reported to [place] at 10:40.  Summary:  RIGHT: - Findings consistent with chronic deep vein thrombosis involving the right femoral vein. - There is no evidence of superficial venous thrombosis.  - A cystic structure is found in the popliteal fossa. - Vein wall thickening seen in distal femoral vein. Reflux seen in left popliteal vein.  LEFT: - There is no evidence of deep vein thrombosis in the lower extremity. - There is no evidence of superficial venous thrombosis.  - No cystic structure found in the popliteal fossa.  *See table(s) above for measurements and observations. Electronically signed by Deitra Mayo MD on 09/06/2020 at 11:32:18 AM.    Final     Assessment & Plan:     Walker Kehr, MD

## 2020-10-17 NOTE — Assessment & Plan Note (Signed)
Discussed Repeat MRI in Sept 2022

## 2020-10-17 NOTE — Assessment & Plan Note (Signed)
Cont w/Eliquis 

## 2020-10-17 NOTE — Assessment & Plan Note (Signed)
URI -bronchitis Tessalon tid prn Z pac if worse

## 2020-10-17 NOTE — Assessment & Plan Note (Signed)
Lorazepam prn  Potential benefits of a long term benzodiazepines  use as well as potential risks  and complications were explained to the patient and were aknowledged.  

## 2020-10-24 DIAGNOSIS — H35352 Cystoid macular degeneration, left eye: Secondary | ICD-10-CM | POA: Diagnosis not present

## 2020-10-24 DIAGNOSIS — H3540 Unspecified peripheral retinal degeneration: Secondary | ICD-10-CM | POA: Diagnosis not present

## 2020-10-24 DIAGNOSIS — H35371 Puckering of macula, right eye: Secondary | ICD-10-CM | POA: Diagnosis not present

## 2020-10-24 DIAGNOSIS — H43811 Vitreous degeneration, right eye: Secondary | ICD-10-CM | POA: Diagnosis not present

## 2020-11-20 ENCOUNTER — Ambulatory Visit
Admission: RE | Admit: 2020-11-20 | Discharge: 2020-11-20 | Disposition: A | Discharge status: Home / Self Care | Payer: Medicare Other | Source: Ambulatory Visit | Attending: Internal Medicine | Admitting: Internal Medicine

## 2020-11-20 DIAGNOSIS — D7389 Other diseases of spleen: Secondary | ICD-10-CM | POA: Diagnosis not present

## 2020-11-20 DIAGNOSIS — K862 Cyst of pancreas: Secondary | ICD-10-CM

## 2020-11-20 DIAGNOSIS — N281 Cyst of kidney, acquired: Secondary | ICD-10-CM | POA: Diagnosis not present

## 2020-11-20 DIAGNOSIS — D734 Cyst of spleen: Secondary | ICD-10-CM | POA: Diagnosis not present

## 2020-11-20 MED ORDER — GADOBENATE DIMEGLUMINE 529 MG/ML IV SOLN
11.0000 mL | Freq: Once | INTRAVENOUS | Status: AC | PRN
  Administered 2020-11-20: 11 mL via INTRAVENOUS

## 2020-11-21 ENCOUNTER — Encounter: Payer: Self-pay | Admitting: Internal Medicine

## 2020-11-21 DIAGNOSIS — N281 Cyst of kidney, acquired: Secondary | ICD-10-CM | POA: Insufficient documentation

## 2020-11-22 DIAGNOSIS — D692 Other nonthrombocytopenic purpura: Secondary | ICD-10-CM | POA: Diagnosis not present

## 2020-11-22 DIAGNOSIS — Z85828 Personal history of other malignant neoplasm of skin: Secondary | ICD-10-CM | POA: Diagnosis not present

## 2020-11-30 ENCOUNTER — Other Ambulatory Visit: Payer: Self-pay | Admitting: Internal Medicine

## 2020-12-05 ENCOUNTER — Telehealth: Payer: Self-pay

## 2020-12-05 NOTE — Telephone Encounter (Signed)
Prolia VOB initiated via [email or web address]  Last OV:  Next OV:  Last Prolia inj: 06/16/20 Next Prolia inj DUE: 12/17/20

## 2020-12-11 ENCOUNTER — Ambulatory Visit: Payer: Medicare Other | Admitting: Obstetrics and Gynecology

## 2020-12-14 NOTE — Telephone Encounter (Signed)
Prior Auth required for Prolia  PA PROCESS DETAILS: PA is required. Providers may call Medical Utilization at [phone] to initiate. Forms may be accessed online at [email or web address]

## 2020-12-18 ENCOUNTER — Ambulatory Visit: Payer: Medicare Other

## 2020-12-21 DIAGNOSIS — H43811 Vitreous degeneration, right eye: Secondary | ICD-10-CM | POA: Diagnosis not present

## 2020-12-21 DIAGNOSIS — H35352 Cystoid macular degeneration, left eye: Secondary | ICD-10-CM | POA: Diagnosis not present

## 2020-12-21 DIAGNOSIS — H35371 Puckering of macula, right eye: Secondary | ICD-10-CM | POA: Diagnosis not present

## 2020-12-25 ENCOUNTER — Ambulatory Visit (INDEPENDENT_AMBULATORY_CARE_PROVIDER_SITE_OTHER): Payer: Medicare Other

## 2020-12-25 ENCOUNTER — Other Ambulatory Visit: Payer: Self-pay

## 2020-12-25 DIAGNOSIS — M810 Age-related osteoporosis without current pathological fracture: Secondary | ICD-10-CM

## 2020-12-25 MED ORDER — DENOSUMAB 60 MG/ML ~~LOC~~ SOSY
60.0000 mg | PREFILLED_SYRINGE | Freq: Once | SUBCUTANEOUS | Status: AC
Start: 2020-12-25 — End: 2020-12-25
  Administered 2020-12-25: 60 mg via SUBCUTANEOUS

## 2020-12-25 NOTE — Progress Notes (Addendum)
Prolia given.  Please co-sign.  Wren Gallaga, [place] screening examination/treatment/procedure(s) were performed by non-physician practitioner and as supervising physician I was immediately available for consultation/collaboration.  I agree with above. Lew Dawes, MD

## 2020-12-29 ENCOUNTER — Ambulatory Visit (INDEPENDENT_AMBULATORY_CARE_PROVIDER_SITE_OTHER): Payer: Medicare Other | Admitting: *Deleted

## 2020-12-29 ENCOUNTER — Other Ambulatory Visit: Payer: Self-pay

## 2020-12-29 DIAGNOSIS — Z23 Encounter for immunization: Secondary | ICD-10-CM | POA: Diagnosis not present

## 2021-01-04 ENCOUNTER — Ambulatory Visit: Payer: Medicare Other | Admitting: Nurse Practitioner

## 2021-01-04 ENCOUNTER — Ambulatory Visit: Payer: Medicare Other | Admitting: Obstetrics and Gynecology

## 2021-01-05 NOTE — Telephone Encounter (Signed)
Prior Auth initiated via fax form.

## 2021-01-05 NOTE — Telephone Encounter (Signed)
Kristen Castillo [employer]  Status: approved  Auth# [id number] Dates: 10.28.22 thru 10.28.23  Member has been notified of this approval  Ready to schedule

## 2021-01-10 DIAGNOSIS — H35352 Cystoid macular degeneration, left eye: Secondary | ICD-10-CM | POA: Diagnosis not present

## 2021-01-10 DIAGNOSIS — H35371 Puckering of macula, right eye: Secondary | ICD-10-CM | POA: Diagnosis not present

## 2021-01-10 DIAGNOSIS — Z961 Presence of intraocular lens: Secondary | ICD-10-CM | POA: Diagnosis not present

## 2021-01-11 NOTE — Progress Notes (Signed)
80 y.o. G48P0010 Married Caucasian female here for annual exam.    Patient is worried about her hCG level, which is a low level positive.  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  No chemotherapy.  She had a hysterectomy and later bilateral salpingo-oophorectomy, each in the 1970s.  She has blood work scheduled for tomorrow.   Left sided abdominal pain following coughing or movement.  She is mentioning it because she is here today and not because it is very bothersome.   PCP:   Dr. Tyrone Apple Plotnikov  No LMP recorded. Patient has had a hysterectomy.           Sexually active: No.  The current method of family planning is status post hysterectomy.    Exercising: Yes.     walking Smoker:  no  Health Maintenance: Pap: 12-04-17 Neg, 10-26-14 unsatisfactory,  History of abnormal Pap:  no MMG: 03-29-20 3D/Neg/BiRads1 Colonoscopy: 08-24-14 - ruptured intestine, colostomy, now with abdominal mesh in abdominal wall.  BMD:  05-03-20  Result :Osteoporosis - PCP - Prolia.  TDaP:  2021 Gardasil:   no HIV: no Hep C: no Screening Labs:  Hb today: PCP, Urine today: not collected     reports that she has never smoked. She has never used smokeless tobacco. She reports that she does not drink alcohol and does not use drugs.  Past Medical History:  Diagnosis Date   Anxiety    Complication of anesthesia    Congestion of both ears    Deviated nasal septum    Diverticulitis    DVT (deep venous thrombosis) (HCC)    Gallstones    GERD (gastroesophageal reflux disease)    Hair loss    Hematuria    in past   History of creation of ostomy ([place])    past partial colectomy in 05/03/14   History of kidney stones    History of skin cancer    Kidney cysts    Migraines    in past   Molar pregnancy    Molar pregnancy    Osteoarthritis    fingers    Osteoporosis    Osteoporosis    Persistent cough    Pneumonia 2019   PONV (postoperative nausea and  vomiting)    sensitive to medications   Skin cancer    Unspecified vitamin D deficiency    Ventral hernia     Past Surgical History:  Procedure Laterality Date   ABDOMINAL HYSTERECTOMY  1972   CYSTOSCOPY WITH RETROGRADE PYELOGRAM, URETEROSCOPY AND STENT PLACEMENT Left 05/21/2019   Procedure: CYSTOSCOPY WITH RETROGRADE PYELOGRAM, URETEROSCOPY AND STENT PLACEMENT;  Surgeon: Alexis Frock, MD;  Location: [facility];  Service: Urology;  Laterality: Left;  1 HR   CYSTOSCOPY WITH RETROGRADE PYELOGRAM, URETEROSCOPY AND STENT PLACEMENT Left 05/31/2020   Procedure: CYSTOSCOPY WITH RETROGRADE PYELOGRAM, URETEROSCOPY, BASKET EXTRACTION AND STENT PLACEMENT;  Surgeon: Alexis Frock, MD;  Location: [facility];  Service: Urology;  Laterality: Left;  1 HR   CYSTOSCOPY/URETEROSCOPY/HOLMIUM LASER/STENT PLACEMENT Left 02/26/2019   Procedure: CYSTOSCOPY/URETEROSCOPY/HOLMIUM LASER/STENT PLACEMENT FIRST STAGE;  Surgeon: Alexis Frock, MD;  Location: [facility];  Service: Urology;  Laterality: Left;  1 HR   EYE SURGERY     FACIAL COSMETIC SURGERY  2009   [name] MD in [place] Left 9/60/4540   Procedure: HOLMIUM LASER APPLICATION;  Surgeon: Alexis Frock, MD;  Location: [facility];  Service: Urology;  Laterality: Left;  HOLMIUM LASER APPLICATION Left 0/12/710   Procedure: HOLMIUM LASER APPLICATION;  Surgeon: Alexis Frock, MD;  Location: [facility];  Service: Urology;  Laterality: Left;   ILEO LOOP COLOSTOMY CLOSURE N/A 11/08/2014   Procedure: LAPAROSCOPIC COLOSTOMY CLOSURE WITH PARTIAL COLECTOMY;  Surgeon: [name], MD;  Location: [facility];  Service: General;  Laterality: N/A;   INSERTION OF MESH N/A 08/01/2015   Procedure: INSERTION OF MESH;  Surgeon: [name], MD;  Location: [facility];  Service: General;  Laterality: N/A;   LAPAROSCOPIC LYSIS OF ADHESIONS N/A 11/08/2014   Procedure: LAPAROSCOPIC LYSIS OF ADHESIONS FOR 90 MINUTES;  Surgeon: [name], MD;  Location: [facility];   Service: General;  Laterality: N/A;   LAPAROSCOPIC LYSIS OF ADHESIONS N/A 08/01/2015   Procedure: LAPAROSCOPIC LYSIS OF ADHESIONS;  Surgeon: [name], MD;  Location: [facility];  Service: General;  Laterality: N/A;   LAPAROSCOPIC PARTIAL COLECTOMY N/A 05/03/2014   Procedure: DIAGNOSTIC LAPAROSCOPY WITH DRAINAGE OF INTRA ABDOMINAL ABSCESS PARTIAL COLECTOMY ;  Surgeon: [name], MD;  Location: [facility];  Service: General;  Laterality: N/A;  supine position   LAPAROTOMY N/A 05/03/2014   Procedure: EXPLORATORY LAPAROTOMY WITH HARTMAN PROCEDURE;  Surgeon: [name], MD;  Location: [facility];  Service: General;  Laterality: N/A;   LIPOMA RESECTION     left scapula area   [place] N/A 08/01/2015   Procedure: LAPAROSCOPIC VENTRAL INCISIONAL  HERNIA;  Surgeon: [name], MD;  Location: [facility];  Service: General;  Laterality: N/A;    Current Outpatient Medications  Medication Sig Dispense Refill   allopurinol (ZYLOPRIM) 100 MG tablet Take 100 mg by mouth 2 (two) times daily.     apixaban (ELIQUIS) 2.5 MG TABS tablet Take 1 tablet (2.5 mg total) by mouth 2 (two) times daily. 60 tablet 11   azithromycin (ZITHROMAX Z-PAK) 250 MG tablet As directed 6 tablet 0   benzonatate (TESSALON) 200 MG capsule TAKE ONE CAPSULE BY MOUTH THREE TIMES DAILY AS NEEDED FOR COUGH 30 capsule 1   Bromfenac Sodium (PROLENSA) 0.07 % SOLN Place 1 drop into the left eye daily.     budesonide (RHINOCORT AQUA) 32 MCG/ACT nasal spray Place 1 spray into both nostrils in the morning and at bedtime.      calcium carbonate (OS-CAL) 600 MG TABS tablet Take 600 mg by mouth daily.      denosumab (PROLIA) 60 MG/ML SOLN injection Inject 60 mg into the skin every 6 (six) months. Administer in upper arm, thigh, or abdomen     Difluprednate 0.05 % EMUL Place 1 drop into the left eye 2 (two) times daily.     famotidine (PEPCID)  20 MG tablet Take 20 mg by mouth at bedtime.      LORazepam (ATIVAN) 0.5 MG tablet TAKE 1 TABLET ONCE DAILY AS NEEDED FOR ANXIETY. 30 tablet 3   Multiple Vitamin (MULTIVITAMIN) tablet Take 1 tablet by mouth every morning.     Polyethyl Glycol-Propyl Glycol (SYSTANE OP) Place 1 drop into both eyes 3 (three) times daily as needed (dry eyes).     Probiotic Product (ALIGN) 4 MG CAPS Take 1 capsule (4 mg total) by mouth daily. 30 capsule 0   No current facility-administered medications for this visit.    Family History  Problem Relation Age of Onset   Arthritis Mother    Colon cancer Mother 50  Heart disease Father    Dementia Father    Diabetes Sister    Cancer Maternal Grandfather        Lung cancer   Heart disease Maternal Grandmother    Stomach cancer Neg Hx     Review of Systems  Gastrointestinal:        Intermittent LLQ pain with movement   All other systems reviewed and are negative.  Exam:   BP (!) 142/88 (BP Location: Left Arm, Patient Position: Sitting, Cuff Size: Normal)   Pulse 66   Resp 14   Ht 5\' 2"  (1.575 m)   Wt 133 lb (60.3 kg)   BMI 24.33 kg/m     General appearance: alert, cooperative and appears stated age Head: normocephalic, without obvious abnormality, atraumatic Neck: no adenopathy, supple, symmetrical, trachea midline and thyroid normal to inspection and palpation Lungs: clear to auscultation bilaterally Breasts: consistent with bilateral breast reduction, no masses or tenderness, No nipple retraction or dimpling, No nipple discharge or bleeding, No axillary adenopathy Heart: regular rate and rhythm Abdomen: vertical midline scar.  Abdomen is soft, non-tender; no masses, no organomegaly Extremities: extremities normal, atraumatic, no cyanosis or edema Skin: skin color, texture, turgor normal. No rashes or lesions Lymph nodes: cervical, supraclavicular, and axillary nodes normal. Neurologic: grossly normal  Pelvic: External genitalia:  no lesions               No abnormal inguinal nodes palpated.              Urethra:  normal appearing urethra with no masses, tenderness or lesions              Bartholins and Skenes: normal                 Vagina: normal appearing vagina with normal color and discharge, no lesions              Cervix: absent              Pap taken: no Bimanual Exam:  Uterus: absent              Adnexa: no mass, fullness, tenderness              Rectal exam: yes.  Confirms.              Anus:  normal sphincter tone, no lesions  Chaperone was present for exam:  Estill Bamberg, CMA  Assessment:   Well woman visit with gynecologic exam. Status post TAH for molar pregnancy in 1970.  Benign.  Status post BSO in 1976.  Benign.  Hx DVT/PE following surgeries.  On Eliquis.  Osteoporosis.   Followed by PCP.  FH colon cancer in mother.  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  Left sided abdominal pain. Possible adhesive disease. Status post bilateral breast reduction.   Plan: Mammogram screening discussed. Self breast awareness reviewed. Pap and HR HPV not indicated.  Guidelines for Calcium, Vitamin D, regular exercise program including cardiovascular and weight bearing exercise. We discussed  benign gestational trophoblastic disease and the unlikely recurrence of disease at this time. We discussed likely false positive hCG results.  If needed, will consider GYN ONC consultation.  Labs and vaccines with PCP. Follow up annually and prn.   After visit summary provided.    30 min  total time was spent for this patient encounter, including preparation, face-to-face counseling with the patient, coordination of care, and documentation of the encounter.

## 2021-01-12 ENCOUNTER — Other Ambulatory Visit: Payer: Self-pay | Admitting: Internal Medicine

## 2021-01-13 ENCOUNTER — Other Ambulatory Visit: Payer: Self-pay | Admitting: Internal Medicine

## 2021-01-13 DIAGNOSIS — Z8619 Personal history of other infectious and parasitic diseases: Secondary | ICD-10-CM | POA: Insufficient documentation

## 2021-01-16 ENCOUNTER — Other Ambulatory Visit: Payer: Self-pay

## 2021-01-16 ENCOUNTER — Encounter: Payer: Self-pay | Admitting: Obstetrics and Gynecology

## 2021-01-16 ENCOUNTER — Ambulatory Visit (INDEPENDENT_AMBULATORY_CARE_PROVIDER_SITE_OTHER): Payer: Medicare Other | Admitting: Obstetrics and Gynecology

## 2021-01-16 VITALS — BP 142/88 | HR 66 | Resp 14 | Ht 62.0 in | Wt 133.0 lb

## 2021-01-16 DIAGNOSIS — Z8759 Personal history of other complications of pregnancy, childbirth and the puerperium: Secondary | ICD-10-CM | POA: Diagnosis not present

## 2021-01-16 DIAGNOSIS — Z9189 Other specified personal risk factors, not elsewhere classified: Secondary | ICD-10-CM | POA: Diagnosis not present

## 2021-01-16 DIAGNOSIS — Z01419 Encounter for gynecological examination (general) (routine) without abnormal findings: Secondary | ICD-10-CM

## 2021-01-16 NOTE — Patient Instructions (Signed)

## 2021-01-17 ENCOUNTER — Ambulatory Visit (INDEPENDENT_AMBULATORY_CARE_PROVIDER_SITE_OTHER): Payer: Medicare Other

## 2021-01-17 ENCOUNTER — Ambulatory Visit (INDEPENDENT_AMBULATORY_CARE_PROVIDER_SITE_OTHER): Payer: Medicare Other | Admitting: Internal Medicine

## 2021-01-17 ENCOUNTER — Encounter: Payer: Self-pay | Admitting: Internal Medicine

## 2021-01-17 VITALS — BP 136/80 | HR 88 | Temp 98.6°F | Ht 62.0 in | Wt 131.0 lb

## 2021-01-17 DIAGNOSIS — R053 Chronic cough: Secondary | ICD-10-CM

## 2021-01-17 DIAGNOSIS — Z8619 Personal history of other infectious and parasitic diseases: Secondary | ICD-10-CM | POA: Diagnosis not present

## 2021-01-17 DIAGNOSIS — Z1211 Encounter for screening for malignant neoplasm of colon: Secondary | ICD-10-CM

## 2021-01-17 DIAGNOSIS — Z800 Family history of malignant neoplasm of digestive organs: Secondary | ICD-10-CM

## 2021-01-17 DIAGNOSIS — Z0000 Encounter for general adult medical examination without abnormal findings: Secondary | ICD-10-CM | POA: Diagnosis not present

## 2021-01-17 DIAGNOSIS — F419 Anxiety disorder, unspecified: Secondary | ICD-10-CM | POA: Diagnosis not present

## 2021-01-17 DIAGNOSIS — R059 Cough, unspecified: Secondary | ICD-10-CM | POA: Diagnosis not present

## 2021-01-17 DIAGNOSIS — N1832 Chronic kidney disease, stage 3b: Secondary | ICD-10-CM

## 2021-01-17 DIAGNOSIS — I700 Atherosclerosis of aorta: Secondary | ICD-10-CM

## 2021-01-17 LAB — LIPID PANEL
Cholesterol: 255 mg/dL — ABNORMAL HIGH (ref 0–200)
HDL: 69.1 mg/dL (ref >39.00)
NonHDL: 185.81
Total CHOL/HDL Ratio: 4
Triglycerides: 231 mg/dL — ABNORMAL HIGH (ref 0.0–149.0)
VLDL: 46.2 mg/dL — ABNORMAL HIGH (ref 0.0–40.0)

## 2021-01-17 LAB — COMPREHENSIVE METABOLIC PANEL
ALT: 9 U/L (ref 0–35)
AST: 16 U/L (ref 0–37)
Albumin: 4.1 g/dL (ref 3.5–5.2)
Alkaline Phosphatase: 73 U/L (ref 39–117)
BUN: 29 mg/dL — ABNORMAL HIGH (ref 6–23)
CO2: 27 mEq/L (ref 19–32)
Calcium: 9.5 mg/dL (ref 8.4–10.5)
Chloride: 105 mEq/L (ref 96–112)
Creatinine, Ser: 1.1 mg/dL (ref 0.40–1.20)
GFR: 47.59 mL/min — ABNORMAL LOW (ref >60.00)
Glucose, Bld: 102 mg/dL — ABNORMAL HIGH (ref 70–99)
Potassium: 4.3 mEq/L (ref 3.5–5.1)
Sodium: 140 mEq/L (ref 135–145)
Total Bilirubin: 0.5 mg/dL (ref 0.2–1.2)
Total Protein: 7.6 g/dL (ref 6.0–8.3)

## 2021-01-17 LAB — URINALYSIS, ROUTINE W REFLEX MICROSCOPIC
Bilirubin Urine: NEGATIVE
Hgb urine dipstick: NEGATIVE
Ketones, ur: NEGATIVE
Leukocytes,Ua: NEGATIVE
Nitrite: NEGATIVE
RBC / HPF: NONE SEEN (ref 0–?)
Specific Gravity, Urine: 1.025 (ref 1.000–1.030)
Total Protein, Urine: >=300 — AB
Urine Glucose: NEGATIVE
Urobilinogen, UA: 0.2 (ref 0.0–1.0)
pH: 6 (ref 5.0–8.0)

## 2021-01-17 LAB — CBC WITH DIFFERENTIAL/PLATELET
Basophils Absolute: 0.1 10*3/uL (ref 0.0–0.1)
Basophils Relative: 0.8 % (ref 0.0–3.0)
Eosinophils Absolute: 0.2 10*3/uL (ref 0.0–0.7)
Eosinophils Relative: 2.5 % (ref 0.0–5.0)
HCT: 41.4 % (ref 36.0–46.0)
Hemoglobin: 13.3 g/dL (ref 12.0–15.0)
Lymphocytes Relative: 21.1 % (ref 12.0–46.0)
Lymphs Abs: 1.7 10*3/uL (ref 0.7–4.0)
MCHC: 32.2 g/dL (ref 30.0–36.0)
MCV: 91.4 fl (ref 78.0–100.0)
Monocytes Absolute: 0.6 10*3/uL (ref 0.1–1.0)
Monocytes Relative: 6.8 % (ref 3.0–12.0)
Neutro Abs: 5.6 10*3/uL (ref 1.4–7.7)
Neutrophils Relative %: 68.8 % (ref 43.0–77.0)
Platelets: 314 10*3/uL (ref 150.0–400.0)
RBC: 4.53 Mil/uL (ref 3.87–5.11)
RDW: 15.3 % (ref 11.5–15.5)
WBC: 8.2 10*3/uL (ref 4.0–10.5)

## 2021-01-17 LAB — TSH: TSH: 4.99 u[IU]/mL (ref 0.35–5.50)

## 2021-01-17 LAB — LDL CHOLESTEROL, DIRECT: Direct LDL: 135 mg/dL

## 2021-01-17 MED ORDER — LEVALBUTEROL TARTRATE 45 MCG/ACT IN AERO: 1.0000 | INHALATION_SPRAY | RESPIRATORY_TRACT | 12 refills | Status: DC | PRN

## 2021-01-17 MED ORDER — BENZONATATE 200 MG PO CAPS: ORAL_CAPSULE | ORAL | 3 refills | Status: DC

## 2021-01-17 MED ORDER — LORATADINE 10 MG PO TABS: 10.0000 mg | ORAL_TABLET | Freq: Every day | ORAL | 11 refills | Status: DC

## 2021-01-17 NOTE — Progress Notes (Signed)
Subjective:  Patient ID: Kristen Castillo, female    DOB: 17-Jul-1940  Age: 80 y.o. MRN: [id number]  CC: No chief complaint on file.   HPI Danaly Bari presents for h/o PE, anticoagulation, chronic cough - seasonal...  Outpatient Medications Prior to Visit  Medication Sig Dispense Refill   allopurinol (ZYLOPRIM) 100 MG tablet Take 100 mg by mouth 2 (two) times daily.     apixaban (ELIQUIS) 2.5 MG TABS tablet Take 1 tablet (2.5 mg total) by mouth 2 (two) times daily. 60 tablet 11   Bromfenac Sodium (PROLENSA) 0.07 % SOLN Place 1 drop into the left eye daily.     budesonide (RHINOCORT AQUA) 32 MCG/ACT nasal spray Place 1 spray into both nostrils in the morning and at bedtime.      calcium carbonate (OS-CAL) 600 MG TABS tablet Take 600 mg by mouth daily.      denosumab (PROLIA) 60 MG/ML SOLN injection Inject 60 mg into the skin every 6 (six) months. Administer in upper arm, thigh, or abdomen     Difluprednate 0.05 % EMUL Place 1 drop into the left eye 2 (two) times daily.     famotidine (PEPCID) 20 MG tablet Take 20 mg by mouth at bedtime.      LORazepam (ATIVAN) 0.5 MG tablet TAKE 1 TABLET ONCE DAILY AS NEEDED FOR ANXIETY. 30 tablet 3   Multiple Vitamin (MULTIVITAMIN) tablet Take 1 tablet by mouth every morning.     Polyethyl Glycol-Propyl Glycol (SYSTANE OP) Place 1 drop into both eyes 3 (three) times daily as needed (dry eyes).     Probiotic Product (ALIGN) 4 MG CAPS Take 1 capsule (4 mg total) by mouth daily. 30 capsule 0   azithromycin (ZITHROMAX Z-PAK) 250 MG tablet As directed 6 tablet 0   benzonatate (TESSALON) 200 MG capsule TAKE ONE CAPSULE BY MOUTH THREE TIMES DAILY AS NEEDED FOR COUGH 30 capsule 1   No facility-administered medications prior to visit.    ROS: Review of Systems  Constitutional:  Negative for activity change, appetite change, chills, fatigue and unexpected weight change.  HENT:  Negative for congestion, mouth sores and sinus pressure.   Eyes:   Negative for visual disturbance.  Respiratory:  Positive for cough. Negative for chest tightness.   Gastrointestinal:  Negative for abdominal pain and nausea.  Genitourinary:  Negative for difficulty urinating, frequency and vaginal pain.  Musculoskeletal:  Negative for back pain and gait problem.  Skin:  Negative for pallor and rash.  Neurological:  Negative for dizziness, tremors, weakness, numbness and headaches.  Psychiatric/Behavioral:  Negative for confusion, sleep disturbance and suicidal ideas. The patient is nervous/anxious.    Objective:  BP 136/80 (BP Location: Left Arm, Patient Position: Sitting, Cuff Size: Large)   Pulse 88   Temp 98.6 F (37 C) (Oral)   Ht 5\' 2"  (1.575 m)   Wt 131 lb (59.4 kg)   SpO2 96%   BMI 23.96 kg/m   BP Readings from Last 3 Encounters:  01/17/21 136/80  01/16/21 (!) 142/88  10/17/20 134/82    Wt Readings from Last 3 Encounters:  01/17/21 131 lb (59.4 kg)  01/16/21 133 lb (60.3 kg)  10/17/20 126 lb 6.4 oz (57.3 kg)    Physical Exam Constitutional:      General: She is not in acute distress.    Appearance: She is well-developed.  HENT:     Head: Normocephalic.     Right Ear: External ear normal.     Left  Ear: External ear normal.     Nose: Nose normal.  Eyes:     General:        Right eye: No discharge.        Left eye: No discharge.     Conjunctiva/sclera: Conjunctivae normal.     Pupils: Pupils are equal, round, and reactive to light.  Neck:     Thyroid: No thyromegaly.     Vascular: No JVD.     Trachea: No tracheal deviation.  Cardiovascular:     Rate and Rhythm: Normal rate and regular rhythm.     Heart sounds: Normal heart sounds.  Pulmonary:     Effort: No respiratory distress.     Breath sounds: No stridor. No wheezing.  Abdominal:     General: Bowel sounds are normal. There is no distension.     Palpations: Abdomen is soft. There is no mass.     Tenderness: There is no abdominal tenderness. There is no guarding or  rebound.  Musculoskeletal:        General: No tenderness.     Cervical back: Normal range of motion and neck supple. No rigidity.  Lymphadenopathy:     Cervical: No cervical adenopathy.  Skin:    Findings: No erythema or rash.  Neurological:     Mental Status: She is oriented to person, place, and time.     Cranial Nerves: No cranial nerve deficit.     Motor: No abnormal muscle tone.     Coordination: Coordination normal.     Deep Tendon Reflexes: Reflexes normal.  Psychiatric:        Behavior: Behavior normal.        Thought Content: Thought content normal.        Judgment: Judgment normal.  Scars on abdomen  Lab Results  Component Value Date   WBC 10.5 05/31/2020   HGB 12.1 05/31/2020   HCT 37.7 05/31/2020   PLT 292 05/31/2020   GLUCOSE 112 (H) 04/10/2020   CHOL 208 (H) 11/03/2018   TRIG 176.0 (H) 11/03/2018   HDL 55.80 11/03/2018   LDLCALC 117 (H) 11/03/2018   ALT 11 07/29/2019   AST 15 07/29/2019   NA 138 04/10/2020   K 4.4 04/10/2020   CL 105 04/10/2020   CREATININE 1.13 04/10/2020   BUN 38 (H) 04/10/2020   CO2 27 04/10/2020   TSH 2.37 07/12/2019   INR 1.3 (H) 05/11/2019   HGBA1C 6.0 (H) 11/03/2014    MR Abdomen W Wo Contrast  Result Date: 11/20/2020 CLINICAL DATA:  Follow-up pancreatic cystic lesions. EXAM: MRI ABDOMEN WITHOUT AND WITH CONTRAST TECHNIQUE: Multiplanar multisequence MR imaging of the abdomen was performed both before and after the administration of intravenous contrast. CONTRAST:  59mL MULTIHANCE GADOBENATE DIMEGLUMINE 529 MG/ML IV SOLN COMPARISON:  MRI August 11, 2019 FINDINGS: Lower chest: Elevation the right hemidiaphragm. No acute abnormality. Hepatobiliary: Mild diffuse hepatic steatosis. 2.2 cm cyst in the inferior aspect of the right lobe of the liver. No enhancing hepatic lesion. Gallbladder is unremarkable. No biliary ductal dilation. Pancreas: Unchanged size of the 6 mm well-circumscribed cystic lesion in the tail the pancreas on image 22/14,  without suspicious postcontrast enhancement. Unchanged size of the well-circumscribed 4 mm cystic lesion in the pancreatic head/uncinate process on image 28/14, without suspicious postcontrast enhancement. No pancreatic ductal dilation. Spleen: Similar size of the benign appearing 2.9 cm cystic lesion in the spleen. 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 Adrenals/Urinary Tract: Bilateral adrenal glands are unremarkable. Bilateral renal cysts some of which are hemorrhagic/proteinaceous. Multiple right renal sinus cysts. No solid enhancing renal masses. Stomach/Bowel: Visualized portions within the abdomen are unremarkable. Vascular/Lymphatic: No pathologically enlarged lymph nodes identified. No abdominal aortic aneurysm demonstrated. Other:  No abdominal ascites. Musculoskeletal: No suspicious bone lesions identified. IMPRESSION: 1. Stable size of the well-circumscribed cystic pancreatic lesions measuring up to 6 mm in the tail, without suspicious postcontrast enhancement or pancreatic ductal dilation. 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 Recommend follow up pre and post contrast MRI/MRCP in 2 years. This recommendation follows ACR consensus guidelines: Management of Incidental Pancreatic Cysts: A White Paper of the ACR Incidental Findings Committee. J Am Coll Radiol 4782;95:621-308. 2. Bilateral Bosniak classification 1 and 2 renal cysts, some of which are hemorrhagic/proteinaceous. No solid enhancing renal mass. 3. Mild diffuse hepatic steatosis. Electronically Signed   By: Dahlia Bailiff M.D.   On: 11/20/2020 15:36    Assessment & Plan:   Problem List Items Addressed This Visit     Anxiety, mild    Cont w/Lorazepam prn  Potential benefits of a long term  benzodiazepines  use as well as potential risks  and complications were explained to the patient and were aknowledged.      Cough    Chronic - mild ?asthmatic bronchitis vs other Try Xopenex again URI -bronchitis 8/22, 11/22 Tessalon tid prn CXR      Relevant Orders   DG Chest 2 View   CRI (chronic renal insufficiency)    Monitoring GFR      Family history of colon cancer    Options discussed Will order Cologuard      Relevant Orders   Cologuard   History of hydatid disease    H/o hydatidiform mole 1972. B-HCG q 12 months F/u w/Dr Jani Files      Other Visit Diagnoses     Colon cancer screening    -  Primary   Relevant Orders   Cologuard         Meds ordered this encounter  Medications   benzonatate (TESSALON) 200 MG capsule    Sig: TAKE ONE CAPSULE BY MOUTH THREE TIMES DAILY AS NEEDED FOR COUGH    Dispense:  90 capsule    Refill:  3   loratadine (CLARITIN) 10 MG tablet    Sig: Take 1 tablet (10 mg total) by mouth daily.    Dispense:  30 tablet    Refill:  11   levalbuterol (XOPENEX HFA) 45 MCG/ACT inhaler    Sig: Inhale 1-2 puffs into the lungs every 4 (four) hours as needed for wheezing.    Dispense:  1 each    Refill:  12      Follow-up: Return in about 3 months (around 04/19/2021) for a follow-up visit.  Walker Kehr, MD

## 2021-01-17 NOTE — Assessment & Plan Note (Signed)
Monitoring GFR 

## 2021-01-17 NOTE — Assessment & Plan Note (Signed)
H/o hydatidiform mole 1972. B-HCG q 12 months F/u w/Dr Jani Files

## 2021-01-17 NOTE — Assessment & Plan Note (Signed)
Cont w/Lorazepam prn  Potential benefits of a long term benzodiazepines  use as well as potential risks  and complications were explained to the patient and were aknowledged. 

## 2021-01-17 NOTE — Assessment & Plan Note (Signed)
On Eliquis

## 2021-01-17 NOTE — Assessment & Plan Note (Signed)
Options discussed Will order Cologuard

## 2021-01-17 NOTE — Assessment & Plan Note (Signed)
Chronic - mild ?asthmatic bronchitis vs other Try Xopenex again URI -bronchitis 8/22, 11/22 Tessalon tid prn CXR

## 2021-01-18 DIAGNOSIS — I700 Atherosclerosis of aorta: Secondary | ICD-10-CM | POA: Insufficient documentation

## 2021-01-18 NOTE — Assessment & Plan Note (Signed)
  On diet  

## 2021-01-18 NOTE — Telephone Encounter (Signed)
Last Prolia inj 12/25/20 Next Prolia inj due 06/26/21

## 2021-01-19 ENCOUNTER — Telehealth: Payer: Self-pay | Admitting: *Deleted

## 2021-01-19 NOTE — Telephone Encounter (Signed)
Rec'd PA on pt Levalbuterol Tartrate. Completed via cover-my-med w/ Key: [id number] - Rx #: [id number]. Rec'd msg stating PA information has been submitted to [employer]. [employer] will review the request and notify you of the determination decision directly, typically within 3 business days.Marland KitchenJohny Chess

## 2021-01-22 NOTE — Telephone Encounter (Signed)
What is covered    Thank you

## 2021-01-22 NOTE — Telephone Encounter (Signed)
Rec'd determination back stating coverage for Medicare Part D for levalbuterol Tartrate 45 mcg is denied. Med is not on pt formulary plan. Must try at least 3 alternative.Marland KitchenJohny Castillo

## 2021-01-23 NOTE — Telephone Encounter (Signed)
Albuterol and Pro air

## 2021-01-24 MED ORDER — ALBUTEROL SULFATE HFA 108 (90 BASE) MCG/ACT IN AERS: 1.0000 | INHALATION_SPRAY | Freq: Four times a day (QID) | RESPIRATORY_TRACT | 6 refills | Status: DC | PRN

## 2021-01-24 NOTE — Telephone Encounter (Signed)
Kristen Castillo, Please inform the patient that her Xopenex is not covered I will send a prescription to for ProAir to try. Thanks, AP

## 2021-01-25 NOTE — Telephone Encounter (Signed)
Called pt there was no answer LMOM w/MD response../lmb 

## 2021-02-11 ENCOUNTER — Other Ambulatory Visit: Payer: Self-pay | Admitting: Internal Medicine

## 2021-02-12 DIAGNOSIS — D225 Melanocytic nevi of trunk: Secondary | ICD-10-CM | POA: Diagnosis not present

## 2021-02-12 DIAGNOSIS — L718 Other rosacea: Secondary | ICD-10-CM | POA: Diagnosis not present

## 2021-02-12 DIAGNOSIS — Z85828 Personal history of other malignant neoplasm of skin: Secondary | ICD-10-CM | POA: Diagnosis not present

## 2021-02-12 DIAGNOSIS — L821 Other seborrheic keratosis: Secondary | ICD-10-CM | POA: Diagnosis not present

## 2021-02-22 DIAGNOSIS — H43811 Vitreous degeneration, right eye: Secondary | ICD-10-CM | POA: Diagnosis not present

## 2021-02-22 DIAGNOSIS — H3540 Unspecified peripheral retinal degeneration: Secondary | ICD-10-CM | POA: Diagnosis not present

## 2021-02-22 DIAGNOSIS — H35371 Puckering of macula, right eye: Secondary | ICD-10-CM | POA: Diagnosis not present

## 2021-02-22 DIAGNOSIS — H35352 Cystoid macular degeneration, left eye: Secondary | ICD-10-CM | POA: Diagnosis not present

## 2021-03-01 ENCOUNTER — Encounter: Payer: Self-pay | Admitting: Internal Medicine

## 2021-03-05 ENCOUNTER — Other Ambulatory Visit: Payer: Self-pay | Admitting: Internal Medicine

## 2021-03-05 DIAGNOSIS — Z8619 Personal history of other infectious and parasitic diseases: Secondary | ICD-10-CM

## 2021-04-12 DIAGNOSIS — L720 Epidermal cyst: Secondary | ICD-10-CM | POA: Diagnosis not present

## 2021-04-12 DIAGNOSIS — L718 Other rosacea: Secondary | ICD-10-CM | POA: Diagnosis not present

## 2021-04-12 DIAGNOSIS — Z85828 Personal history of other malignant neoplasm of skin: Secondary | ICD-10-CM | POA: Diagnosis not present

## 2021-04-12 DIAGNOSIS — L570 Actinic keratosis: Secondary | ICD-10-CM | POA: Diagnosis not present

## 2021-04-16 ENCOUNTER — Ambulatory Visit (INDEPENDENT_AMBULATORY_CARE_PROVIDER_SITE_OTHER): Payer: Medicare Other | Admitting: Psychology

## 2021-04-16 DIAGNOSIS — F4322 Adjustment disorder with anxiety: Secondary | ICD-10-CM | POA: Diagnosis not present

## 2021-04-16 NOTE — Progress Notes (Signed)
04/16/2021   Diagnosis Adjustment Disorder Symptoms Anxiety, chronic worry, insecurity Medication Status No psychotropics Safety N/A If Suicidal or Homicidal State Action Taken: N/A Current Risk:  Medications None Objectives Reduce symptoms of anxiety and chronic worry Client Response full compliance  Service Location Location, [address] [place], [place] [address]  Service Code cpt [phone]  Behavioral activation plan  Identify automatic thoughts  Rationally challenge thoughts or beliefs/cognitive restructuring  Identify/label emotions  Validate/empathize  Provide education, information  Self care activities  Session Notes: Dx.: Adjustment Disorder  Meds.: No psychotropics  Goals: Son and his wife are divorced. She is very close with grandchildren and experiences considerable stress associated with ex-daughter in law's alienation. She is frequently fearful and tearful that her grandchildren will be influenced by their mother and it will damage their relationship. She is seeking counseling to reduce fears and symptoms. Therapy sessions will be on a "as needed basis". No specific goal date. Patient agrees to a virtual video session due to the pandemic. She is at home and I am at my home office.  [place] (and spouse [name]): They express considerable concern about grandson Larkin Ina. He has been much less involved with friends and has pulled back socially. He does not appear distressed by his lack of social involvement. He does not, however, appear to be upset by his lack of social involvement. We talked about their level of involvement and the best strategy to be supportive without being intrusive. They will talk with son about talking with doctor about growth concerns. They will also wait to observe how Larkin Ina reacts to his transition to high school. Talked about "warning signs".     Marcelina Morel, PhD Time 11:35a-12:30p 55 min.

## 2021-04-23 ENCOUNTER — Other Ambulatory Visit: Payer: Self-pay

## 2021-04-23 ENCOUNTER — Ambulatory Visit (INDEPENDENT_AMBULATORY_CARE_PROVIDER_SITE_OTHER): Payer: Medicare Other | Admitting: Internal Medicine

## 2021-04-23 ENCOUNTER — Encounter: Payer: Self-pay | Admitting: Internal Medicine

## 2021-04-23 VITALS — BP 146/90 | HR 77 | Temp 98.1°F | Ht 62.0 in | Wt 136.0 lb

## 2021-04-23 DIAGNOSIS — Z7184 Encounter for health counseling related to travel: Secondary | ICD-10-CM

## 2021-04-23 DIAGNOSIS — K219 Gastro-esophageal reflux disease without esophagitis: Secondary | ICD-10-CM | POA: Diagnosis not present

## 2021-04-23 DIAGNOSIS — Z1211 Encounter for screening for malignant neoplasm of colon: Secondary | ICD-10-CM | POA: Diagnosis not present

## 2021-04-23 DIAGNOSIS — N200 Calculus of kidney: Secondary | ICD-10-CM

## 2021-04-23 DIAGNOSIS — N1832 Chronic kidney disease, stage 3b: Secondary | ICD-10-CM

## 2021-04-23 DIAGNOSIS — F419 Anxiety disorder, unspecified: Secondary | ICD-10-CM

## 2021-04-23 MED ORDER — LORAZEPAM 0.5 MG PO TABS: ORAL_TABLET | ORAL | 1 refills | Status: DC

## 2021-04-23 MED ORDER — APIXABAN 2.5 MG PO TABS: 2.5000 mg | ORAL_TABLET | Freq: Two times a day (BID) | ORAL | 3 refills | Status: DC

## 2021-04-23 MED ORDER — BUDESONIDE 32 MCG/ACT NA SUSP: 1.0000 | Freq: Two times a day (BID) | NASAL | 5 refills | Status: DC

## 2021-04-23 MED ORDER — DIPHENOXYLATE-ATROPINE 2.5-0.025 MG PO TABS: 1.0000 | ORAL_TABLET | Freq: Four times a day (QID) | ORAL | 0 refills | Status: DC | PRN

## 2021-04-23 MED ORDER — BENZONATATE 200 MG PO CAPS: ORAL_CAPSULE | ORAL | 3 refills | Status: DC

## 2021-04-23 MED ORDER — ALLOPURINOL 100 MG PO TABS: 100.0000 mg | ORAL_TABLET | Freq: Two times a day (BID) | ORAL | 3 refills | Status: DC

## 2021-04-23 NOTE — Assessment & Plan Note (Signed)
On Pepcid 

## 2021-04-23 NOTE — Assessment & Plan Note (Signed)
Cont w/Lorazepam prn  Potential benefits of a long term benzodiazepines  use as well as potential risks  and complications were explained to the patient and were aknowledged. 

## 2021-04-23 NOTE — Assessment & Plan Note (Signed)
On Allopurinol 100 mg bid po

## 2021-04-23 NOTE — Assessment & Plan Note (Addendum)
[place] 4/23 Discussed Lomotil prn

## 2021-04-23 NOTE — Progress Notes (Signed)
Subjective:  Patient ID: Kristen Castillo, female    DOB: Jul 14, 1940  Age: 81 y.o. MRN: [id number]  CC: No chief complaint on file.   HPI Aroush Chasse presents for travel to [place], h/o PE, anxiety f/u  Outpatient Medications Prior to Visit  Medication Sig Dispense Refill   albuterol (PROAIR HFA) 108 (90 Base) MCG/ACT inhaler Inhale 1-2 puffs into the lungs every 6 (six) hours as needed for wheezing or shortness of breath. 1 each 6   Bromfenac Sodium (PROLENSA) 0.07 % SOLN Place 1 drop into the left eye daily.     calcium carbonate (OS-CAL) 600 MG TABS tablet Take 600 mg by mouth daily.      denosumab (PROLIA) 60 MG/ML SOLN injection Inject 60 mg into the skin every 6 (six) months. Administer in upper arm, thigh, or abdomen     Difluprednate 0.05 % EMUL Place 1 drop into the left eye 2 (two) times daily.     famotidine (PEPCID) 20 MG tablet Take 20 mg by mouth at bedtime.      loratadine (CLARITIN) 10 MG tablet Take 1 tablet (10 mg total) by mouth daily. 30 tablet 11   Multiple Vitamin (MULTIVITAMIN) tablet Take 1 tablet by mouth every morning.     Polyethyl Glycol-Propyl Glycol (SYSTANE OP) Place 1 drop into both eyes 3 (three) times daily as needed (dry eyes).     Probiotic Product (ALIGN) 4 MG CAPS Take 1 capsule (4 mg total) by mouth daily. 30 capsule 0   allopurinol (ZYLOPRIM) 100 MG tablet Take 100 mg by mouth 2 (two) times daily.     apixaban (ELIQUIS) 2.5 MG TABS tablet Take 1 tablet (2.5 mg total) by mouth 2 (two) times daily. 60 tablet 11   benzonatate (TESSALON) 200 MG capsule TAKE ONE CAPSULE BY MOUTH THREE TIMES DAILY AS NEEDED FOR COUGH 90 capsule 3   budesonide (RHINOCORT AQUA) 32 MCG/ACT nasal spray Place 1 spray into both nostrils in the morning and at bedtime.      LORazepam (ATIVAN) 0.5 MG tablet TAKE ONE TABLET ONCE DAILY AS NEEDED FOR ANXIETY 30 tablet 3   No facility-administered medications prior to visit.    ROS: Review of Systems  Constitutional:   Negative for activity change, appetite change, chills, fatigue and unexpected weight change.  HENT:  Negative for congestion, mouth sores and sinus pressure.   Eyes:  Negative for visual disturbance.  Respiratory:  Negative for cough and chest tightness.   Gastrointestinal:  Negative for abdominal pain and nausea.  Genitourinary:  Negative for difficulty urinating, frequency and vaginal pain.  Musculoskeletal:  Positive for gait problem. Negative for back pain.  Skin:  Negative for pallor, rash and wound.  Neurological:  Negative for dizziness, tremors, weakness, numbness and headaches.  Psychiatric/Behavioral:  Negative for confusion, sleep disturbance and suicidal ideas.    Objective:  BP (!) 146/90 (BP Location: Left Arm, Patient Position: Sitting, Cuff Size: Large)    Pulse 77    Temp 98.1 F (36.7 C) (Oral)    Ht 5\' 2"  (1.575 m)    Wt 136 lb (61.7 kg)    SpO2 96%    BMI 24.87 kg/m   BP Readings from Last 3 Encounters:  04/23/21 (!) 146/90  01/17/21 136/80  01/16/21 (!) 142/88    Wt Readings from Last 3 Encounters:  04/23/21 136 lb (61.7 kg)  01/17/21 131 lb (59.4 kg)  01/16/21 133 lb (60.3 kg)    Physical Exam Constitutional:  General: She is not in acute distress.    Appearance: She is well-developed.  HENT:     Head: Normocephalic.     Right Ear: External ear normal.     Left Ear: External ear normal.     Nose: Nose normal.  Eyes:     General:        Right eye: No discharge.        Left eye: No discharge.     Conjunctiva/sclera: Conjunctivae normal.     Pupils: Pupils are equal, round, and reactive to light.  Neck:     Thyroid: No thyromegaly.     Vascular: No JVD.     Trachea: No tracheal deviation.  Cardiovascular:     Rate and Rhythm: Normal rate and regular rhythm.     Heart sounds: Normal heart sounds.  Pulmonary:     Effort: No respiratory distress.     Breath sounds: No stridor. No wheezing.  Abdominal:     General: Bowel sounds are normal.  There is no distension.     Palpations: Abdomen is soft. There is no mass.     Tenderness: There is no abdominal tenderness. There is no guarding or rebound.  Musculoskeletal:        General: No tenderness.     Cervical back: Normal range of motion and neck supple. No rigidity.  Lymphadenopathy:     Cervical: No cervical adenopathy.  Skin:    Findings: No erythema or rash.  Neurological:     Cranial Nerves: No cranial nerve deficit.     Motor: No abnormal muscle tone.     Coordination: Coordination normal.     Deep Tendon Reflexes: Reflexes normal.  Psychiatric:        Behavior: Behavior normal.        Thought Content: Thought content normal.        Judgment: Judgment normal.    Lab Results  Component Value Date   WBC 8.2 01/17/2021   HGB 13.3 01/17/2021   HCT 41.4 01/17/2021   PLT 314.0 01/17/2021   GLUCOSE 102 (H) 01/17/2021   CHOL 255 (H) 01/17/2021   TRIG 231.0 (H) 01/17/2021   HDL 69.10 01/17/2021   LDLDIRECT 135.0 01/17/2021   LDLCALC 117 (H) 11/03/2018   ALT 9 01/17/2021   AST 16 01/17/2021   NA 140 01/17/2021   K 4.3 01/17/2021   CL 105 01/17/2021   CREATININE 1.10 01/17/2021   BUN 29 (H) 01/17/2021   CO2 27 01/17/2021   TSH 4.99 01/17/2021   INR 1.3 (H) 05/11/2019   HGBA1C 6.0 (H) 11/03/2014    MR Abdomen W Wo Contrast  Result Date: 11/20/2020 CLINICAL DATA:  Follow-up pancreatic cystic lesions. EXAM: MRI ABDOMEN WITHOUT AND WITH CONTRAST TECHNIQUE: Multiplanar multisequence MR imaging of the abdomen was performed both before and after the administration of intravenous contrast. CONTRAST:  49mL MULTIHANCE GADOBENATE DIMEGLUMINE 529 MG/ML IV SOLN COMPARISON:  MRI August 11, 2019 FINDINGS: Lower chest: Elevation the right hemidiaphragm. No acute abnormality. Hepatobiliary: Mild diffuse hepatic steatosis. 2.2 cm cyst in the inferior aspect of the right lobe of the liver. No enhancing hepatic lesion. Gallbladder is unremarkable. No biliary ductal dilation. Pancreas:  Unchanged size of the 6 mm well-circumscribed cystic lesion in the tail the pancreas on image 22/14, without suspicious postcontrast enhancement. Unchanged size of the well-circumscribed 4 mm cystic lesion in the pancreatic head/uncinate process on image 28/14, without suspicious postcontrast enhancement. No pancreatic ductal dilation. Spleen: Similar size of the  benign appearing 2.9 cm cystic lesion in the spleen. 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 Adrenals/Urinary Tract: Bilateral adrenal glands are unremarkable. Bilateral renal cysts some of which are hemorrhagic/proteinaceous. Multiple right renal sinus cysts. No solid enhancing renal masses. Stomach/Bowel: Visualized portions within the abdomen are unremarkable. Vascular/Lymphatic: No pathologically enlarged lymph nodes identified. No abdominal aortic aneurysm demonstrated. Other:  No abdominal ascites. Musculoskeletal: No suspicious bone lesions identified. IMPRESSION: 1. Stable size of the well-circumscribed cystic pancreatic lesions measuring up to 6 mm in the tail, without suspicious postcontrast enhancement or pancreatic ductal dilation. 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 Recommend follow up pre and post contrast MRI/MRCP in 2 years. This recommendation follows ACR consensus guidelines: Management of Incidental Pancreatic Cysts: A White Paper of the ACR Incidental Findings Committee. J Am Coll Radiol 5956;38:756-433. 2. Bilateral Bosniak classification 1 and 2 renal cysts, some of which are hemorrhagic/proteinaceous. No solid enhancing renal mass. 3. Mild diffuse hepatic steatosis. Electronically Signed   By: Dahlia Bailiff M.D.   On: 11/20/2020 15:36    Assessment & Plan:   Problem List Items  Addressed This Visit     Anxiety, mild    Cont w/Lorazepam prn  Potential benefits of a long term benzodiazepines  use as well as potential risks  and complications were explained to the patient and were aknowledged.      Relevant Medications   LORazepam (ATIVAN) 0.5 MG tablet   CRI (chronic renal insufficiency)    Stage 3      GERD (gastroesophageal reflux disease)    On Pepcid      Relevant Medications   diphenoxylate-atropine (LOMOTIL) 2.5-0.025 MG tablet   Nephrolithiasis    On Allopurinol 100 mg bid po      Relevant Medications   allopurinol (ZYLOPRIM) 100 MG tablet   Travel advice encounter    Jacqulyn Liner 4/23 Discussed Lomotil prn      Other Visit Diagnoses     Colon cancer screening    -  Primary   Relevant Orders   Cologuard         Meds ordered this encounter  Medications   diphenoxylate-atropine (LOMOTIL) 2.5-0.025 MG tablet    Sig: Take 1 tablet by mouth 4 (four) times daily as needed for diarrhea or loose stools.    Dispense:  60 tablet    Refill:  0   apixaban (ELIQUIS) 2.5 MG TABS tablet    Sig: Take 1 tablet (2.5 mg total) by mouth 2 (two) times daily.    Dispense:  180 tablet    Refill:  3    Pt has requested to have this medication delivered to her home.   benzonatate (TESSALON) 200 MG capsule    Sig: TAKE ONE CAPSULE BY MOUTH THREE TIMES DAILY AS NEEDED FOR COUGH    Dispense:  90 capsule    Refill:  3   budesonide (RHINOCORT AQUA) 32 MCG/ACT nasal spray    Sig: Place 1 spray into both nostrils in the morning and at bedtime.    Dispense:  8 mL    Refill:  5   LORazepam (ATIVAN) 0.5 MG tablet    Sig: TAKE ONE TABLET ONCE DAILY AS NEEDED FOR ANXIETY    Dispense:  90 tablet    Refill:  1   allopurinol (ZYLOPRIM) 100 MG tablet  Sig: Take 1 tablet (100 mg total) by mouth 2 (two) times daily.    Dispense:  180 tablet    Refill:  3      Follow-up: Return in about 6 months (around 10/21/2021) for Wellness Exam.  Walker Kehr, MD

## 2021-04-23 NOTE — Assessment & Plan Note (Signed)
Stage 3

## 2021-05-03 DIAGNOSIS — H35371 Puckering of macula, right eye: Secondary | ICD-10-CM | POA: Diagnosis not present

## 2021-05-03 DIAGNOSIS — H35352 Cystoid macular degeneration, left eye: Secondary | ICD-10-CM | POA: Diagnosis not present

## 2021-05-03 DIAGNOSIS — H43391 Other vitreous opacities, right eye: Secondary | ICD-10-CM | POA: Diagnosis not present

## 2021-05-03 DIAGNOSIS — H43811 Vitreous degeneration, right eye: Secondary | ICD-10-CM | POA: Diagnosis not present

## 2021-05-17 ENCOUNTER — Other Ambulatory Visit: Payer: Self-pay | Admitting: Internal Medicine

## 2021-05-17 DIAGNOSIS — Z1231 Encounter for screening mammogram for malignant neoplasm of breast: Secondary | ICD-10-CM

## 2021-05-18 NOTE — Telephone Encounter (Signed)
Prior auth required for PROLIA ? ?PA PROCESS DETAILS: PA is required. Providers may call Medical Utilization at [phone] to initiate. ?Forms may be accessed online at [email or web address] ?

## 2021-05-30 ENCOUNTER — Ambulatory Visit
Admission: RE | Admit: 2021-05-30 | Discharge: 2021-05-30 | Disposition: A | Discharge status: Home / Self Care | Payer: Medicare Other | Source: Ambulatory Visit | Attending: Internal Medicine | Admitting: Internal Medicine

## 2021-05-30 ENCOUNTER — Other Ambulatory Visit: Payer: Self-pay | Admitting: Obstetrics and Gynecology

## 2021-05-30 DIAGNOSIS — Z1231 Encounter for screening mammogram for malignant neoplasm of breast: Secondary | ICD-10-CM

## 2021-05-31 NOTE — Telephone Encounter (Signed)
Prior auth initiated via [email or web address] ?KEY: [id number] ? ? ?

## 2021-06-04 DIAGNOSIS — N202 Calculus of kidney with calculus of ureter: Secondary | ICD-10-CM | POA: Diagnosis not present

## 2021-06-04 DIAGNOSIS — N281 Cyst of kidney, acquired: Secondary | ICD-10-CM | POA: Diagnosis not present

## 2021-06-04 DIAGNOSIS — E790 Hyperuricemia without signs of inflammatory arthritis and tophaceous disease: Secondary | ICD-10-CM | POA: Diagnosis not present

## 2021-06-05 ENCOUNTER — Other Ambulatory Visit: Payer: Self-pay | Admitting: Internal Medicine

## 2021-06-05 DIAGNOSIS — Z1211 Encounter for screening for malignant neoplasm of colon: Secondary | ICD-10-CM

## 2021-06-12 ENCOUNTER — Telehealth: Payer: Self-pay | Admitting: *Deleted

## 2021-06-12 ENCOUNTER — Telehealth: Payer: Self-pay | Admitting: Internal Medicine

## 2021-06-12 DIAGNOSIS — Z8759 Personal history of other complications of pregnancy, childbirth and the puerperium: Secondary | ICD-10-CM

## 2021-06-12 NOTE — Telephone Encounter (Signed)
Note given to Provider. ?

## 2021-06-12 NOTE — Telephone Encounter (Signed)
PT's spouse visits today with forms to be filled out by Dr.Plotnikov! Forms have been left in Dr.Plotnikov's mailbox! ? ?CB: [phone] ?

## 2021-06-12 NOTE — Telephone Encounter (Signed)
Patient typed a letter and had it faxed to office asking if HCG level can be checked? Patient has history of molar pregnancy. Dr.Kendall checked HCG in 2021. Patient said Dr.Plotnikov didn't order test at office visit in 01/2021. Please advise  ?

## 2021-06-13 ENCOUNTER — Telehealth: Payer: Self-pay | Admitting: Internal Medicine

## 2021-06-13 DIAGNOSIS — Z1211 Encounter for screening for malignant neoplasm of colon: Secondary | ICD-10-CM

## 2021-06-13 NOTE — Telephone Encounter (Signed)
Please reach out to pt to update insurance. ? ? ?

## 2021-06-13 NOTE — Telephone Encounter (Signed)
Cologuard order is not going through acc to the pt. ?Verdis Frederickson, please check if I put this order in correctly. ?Thanks, ?AP ? ?

## 2021-06-13 NOTE — Telephone Encounter (Signed)
Patient asking if there is any update on Cologuard order, she states this has been going on since November. ?I advised patient you were working on it and would call her with an update- she is requesting a callback ASAP. ?

## 2021-06-14 NOTE — Telephone Encounter (Signed)
There is already an order in Epic from Dr. Alain Marion for the hCG to be drawn.  She would just need to have the blood collected at his lab. ? ?Her other option is to come here for a lab appointment for a quantitative hCG.   ?

## 2021-06-14 NOTE — Telephone Encounter (Signed)
Encounter reviewed and closed.  

## 2021-06-14 NOTE — Telephone Encounter (Signed)
Patient said she would like to have HCG drawn here. Order placed. Lab appointment scheduled.  ?

## 2021-06-16 NOTE — Telephone Encounter (Signed)
Kristen Castillo, please check on the status.  I think I have ordered it 3 times already.  Is there a problem? ?Thanks ?

## 2021-06-19 NOTE — Telephone Encounter (Signed)
Called pt no answer inform that MD placed order back in February. Have placed another cologuard order should be contacted by [employer].Marland KitchenJohny Castillo ?

## 2021-06-23 ENCOUNTER — Encounter: Payer: Self-pay | Admitting: Internal Medicine

## 2021-06-25 DIAGNOSIS — Z1211 Encounter for screening for malignant neoplasm of colon: Secondary | ICD-10-CM | POA: Diagnosis not present

## 2021-06-26 ENCOUNTER — Ambulatory Visit (INDEPENDENT_AMBULATORY_CARE_PROVIDER_SITE_OTHER): Payer: Medicare Other

## 2021-06-26 ENCOUNTER — Telehealth: Payer: Self-pay | Admitting: *Deleted

## 2021-06-26 ENCOUNTER — Other Ambulatory Visit: Payer: Medicare Other

## 2021-06-26 DIAGNOSIS — Z8759 Personal history of other complications of pregnancy, childbirth and the puerperium: Secondary | ICD-10-CM | POA: Diagnosis not present

## 2021-06-26 DIAGNOSIS — Z0000 Encounter for general adult medical examination without abnormal findings: Secondary | ICD-10-CM

## 2021-06-26 NOTE — Patient Instructions (Signed)
Kristen Castillo , ?Thank you for taking time to come for your Medicare Wellness Visit. I appreciate your ongoing commitment to your health goals. Please review the following plan we discussed and let me know if I can assist you in the future.  ? ?Screening recommendations/referrals: ?Cologuard: 06/26/2021; due every 3 years ?Mammogram: 05/30/2021; due every year ?Bone Density: 05/03/2020; due every 2-3 years ?Recommended yearly ophthalmology/optometry visit for glaucoma screening and checkup ?Recommended yearly dental visit for hygiene and checkup ? ?Vaccinations: ?Influenza vaccine: 12/29/2020 ?Pneumococcal vaccine: 04/14/2013, 09/16/2017 ?Tdap vaccine: 08/26/2019; due every 10 years ?Shingles vaccine: 05/20/2018, 6/17/220   ?Covid-19: 03/30/2019, 04/17/2019, 12/29/2019, 07/11/2020, 12/08/2020 ? ?Advanced directives: Yes; Please bring a copy of your health care power of attorney and living will to the office at your convenience. ?= ? ?Conditions/risks identified: Yes; stay healthy and do not fall. ? ?Next appointment: Please schedule your next Medicare Wellness Visit with your Nurse Health Advisor in 1 year by calling [phone]. ? ? ?Preventive Care 1 Years and Older, Female ?Preventive care refers to lifestyle choices and visits with your health care provider that can promote health and wellness. ?What does preventive care include? ?A yearly physical exam. This is also called an annual well check. ?Dental exams once or twice a year. ?Routine eye exams. Ask your health care provider how often you should have your eyes checked. ?Personal lifestyle choices, including: ?Daily care of your teeth and gums. ?Regular physical activity. ?Eating a healthy diet. ?Avoiding tobacco and drug use. ?Limiting alcohol use. ?Practicing safe sex. ?Taking low-dose aspirin every day. ?Taking vitamin and mineral supplements as recommended by your health care provider. ?What happens during an annual well check? ?The services and screenings done by your  health care provider during your annual well check will depend on your age, overall health, lifestyle risk factors, and family history of disease. ?Counseling  ?Your health care provider may ask you questions about your: ?Alcohol use. ?Tobacco use. ?Drug use. ?Emotional well-being. ?Home and relationship well-being. ?Sexual activity. ?Eating habits. ?History of falls. ?Memory and ability to understand (cognition). ?Work and work Statistician. ?Reproductive health. ?Screening  ?You may have the following tests or measurements: ?Height, weight, and BMI. ?Blood pressure. ?Lipid and cholesterol levels. These may be checked every 5 years, or more frequently if you are over 11 years old. ?Skin check. ?Lung cancer screening. You may have this screening every year starting at age 60 if you have a 30-pack-year history of smoking and currently smoke or have quit within the past 15 years. ?Fecal occult blood test (FOBT) of the stool. You may have this test every year starting at age 76. ?Flexible sigmoidoscopy or colonoscopy. You may have a sigmoidoscopy every 5 years or a colonoscopy every 10 years starting at age 35. ?Hepatitis C blood test. ?Hepatitis B blood test. ?Sexually transmitted disease (STD) testing. ?Diabetes screening. This is done by checking your blood sugar (glucose) after you have not eaten for a while (fasting). You may have this done every 1-3 years. ?Bone density scan. This is done to screen for osteoporosis. You may have this done starting at age 35. ?Mammogram. This may be done every 1-2 years. Talk to your health care provider about how often you should have regular mammograms. ?Talk with your health care provider about your test results, treatment options, and if necessary, the need for more tests. ?Vaccines  ?Your health care provider may recommend certain vaccines, such as: ?Influenza vaccine. This is recommended every year. ?Tetanus, diphtheria, and acellular pertussis (  Tdap, Td) vaccine. You may  need a Td booster every 10 years. ?Zoster vaccine. You may need this after age 8. ?Pneumococcal 13-valent conjugate (PCV13) vaccine. One dose is recommended after age 20. ?Pneumococcal polysaccharide (PPSV23) vaccine. One dose is recommended after age 56. ?Talk to your health care provider about which screenings and vaccines you need and how often you need them. ?This information is not intended to replace advice given to you by your health care provider. Make sure you discuss any questions you have with your health care provider. ?Document Released: 03/24/2015 Document Revised: 11/15/2015 Document Reviewed: 12/27/2014 ?Elsevier Interactive Patient Education ? 2017 [place]. ? ?Fall Prevention in the Home ?Falls can cause injuries. They can happen to people of all ages. There are many things you can do to make your home safe and to help prevent falls. ?What can I do on the outside of my home? ?Regularly fix the edges of walkways and driveways and fix any cracks. ?Remove anything that might make you trip as you walk through a door, such as a raised step or threshold. ?Trim any bushes or trees on the path to your home. ?Use bright outdoor lighting. ?Clear any walking paths of anything that might make someone trip, such as rocks or tools. ?Regularly check to see if handrails are loose or broken. Make sure that both sides of any steps have handrails. ?Any raised decks and porches should have guardrails on the edges. ?Have any leaves, snow, or ice cleared regularly. ?Use sand or salt on walking paths during winter. ?Clean up any spills in your garage right away. This includes oil or grease spills. ?What can I do in the bathroom? ?Use night lights. ?Install grab bars by the toilet and in the tub and shower. Do not use towel bars as grab bars. ?Use non-skid mats or decals in the tub or shower. ?If you need to sit down in the shower, use a plastic, non-slip stool. ?Keep the floor dry. Clean up any water that spills on  the floor as soon as it happens. ?Remove soap buildup in the tub or shower regularly. ?Attach bath mats securely with double-sided non-slip rug tape. ?Do not have throw rugs and other things on the floor that can make you trip. ?What can I do in the bedroom? ?Use night lights. ?Make sure that you have a light by your bed that is easy to reach. ?Do not use any sheets or blankets that are too big for your bed. They should not hang down onto the floor. ?Have a firm chair that has side arms. You can use this for support while you get dressed. ?Do not have throw rugs and other things on the floor that can make you trip. ?What can I do in the kitchen? ?Clean up any spills right away. ?Avoid walking on wet floors. ?Keep items that you use a lot in easy-to-reach places. ?If you need to reach something above you, use a strong step stool that has a grab bar. ?Keep electrical cords out of the way. ?Do not use floor polish or wax that makes floors slippery. If you must use wax, use non-skid floor wax. ?Do not have throw rugs and other things on the floor that can make you trip. ?What can I do with my stairs? ?Do not leave any items on the stairs. ?Make sure that there are handrails on both sides of the stairs and use them. Fix handrails that are broken or loose. Make sure that handrails are  as long as the stairways. ?Check any carpeting to make sure that it is firmly attached to the stairs. Fix any carpet that is loose or worn. ?Avoid having throw rugs at the top or bottom of the stairs. If you do have throw rugs, attach them to the floor with carpet tape. ?Make sure that you have a light switch at the top of the stairs and the bottom of the stairs. If you do not have them, ask someone to add them for you. ?What else can I do to help prevent falls? ?Wear shoes that: ?Do not have high heels. ?Have rubber bottoms. ?Are comfortable and fit you well. ?Are closed at the toe. Do not wear sandals. ?If you use a stepladder: ?Make sure  that it is fully opened. Do not climb a closed stepladder. ?Make sure that both sides of the stepladder are locked into place. ?Ask someone to hold it for you, if possible. ?Clearly mark and make sure t

## 2021-06-26 NOTE — Telephone Encounter (Signed)
Rec/d msg off cover-my-meds...patient need PA for Lorazepam . Completed on cover-my-meds w/ Key: [id number]. Pa sent to insurance plan for determination.../lmb ?

## 2021-06-26 NOTE — Progress Notes (Addendum)
?I connected with Kristen Castillo today by telephone and verified that I am speaking with the correct person using two identifiers. ?Location patient: home ?Location provider: work ?Persons participating in the virtual visit: patient, provider. ?  ?I discussed the limitations, risks, security and privacy concerns of performing an evaluation and management service by telephone and the availability of in person appointments. I also discussed with the patient that there may be a patient responsible charge related to this service. The patient expressed understanding and verbally consented to this telephonic visit.  ?  ?Interactive audio and video telecommunications were attempted between this provider and patient, however failed, due to patient having technical difficulties OR patient did not have access to video capability.  We continued and completed visit with audio only. ? ?Some vital signs may be absent or patient reported.  ? ?Time Spent with patient on telephone encounter: 30 minutes ? ?Subjective:  ? Kristen Castillo is a 81 y.o. female who presents for Medicare Annual (Subsequent) preventive examination. ? ?Review of Systems    ? ?Cardiac Risk Factors include: advanced age (>51mn, >>85women) ? ?   ?Objective:  ?  ?There were no vitals filed for this visit. ?There is no height or weight on file to calculate BMI. ? ? ?  06/26/2021  ? 11:35 AM 05/31/2020  ?  3:02 PM 05/30/2020  ?  2:14 PM 05/13/2019  ?  8:08 AM 02/26/2019  ?  7:18 AM 02/25/2019  ?  9:23 AM 02/22/2019  ?  6:56 AM  ?Advanced Directives  ?Does Patient Have a Medical Advance Directive? Yes Yes Yes Yes No No No  ?Type of Advance Directive Living will;[place]care [place]      ?Does patient want to make changes to medical advance directive? No - Patient declined No - Patient declined       ?Copy of [place]? No - copy requested No - copy  requested No - copy requested      ?Would patient like information on creating a medical advance directive?     No - Patient declined  No - Patient declined  ? ? ?Current Medications (verified) ?Outpatient Encounter Medications as of 06/26/2021  ?Medication Sig  ? albuterol (PROAIR HFA) 108 (90 Base) MCG/ACT inhaler Inhale 1-2 puffs into the lungs every 6 (six) hours as needed for wheezing or shortness of breath.  ? allopurinol (ZYLOPRIM) 100 MG tablet Take 1 tablet (100 mg total) by mouth 2 (two) times daily.  ? apixaban (ELIQUIS) 2.5 MG TABS tablet Take 1 tablet (2.5 mg total) by mouth 2 (two) times daily.  ? benzonatate (TESSALON) 200 MG capsule TAKE ONE CAPSULE BY MOUTH THREE TIMES DAILY AS NEEDED FOR COUGH  ? Bromfenac Sodium (PROLENSA) 0.07 % SOLN Place 1 drop into the left eye daily.  ? budesonide (RHINOCORT AQUA) 32 MCG/ACT nasal spray Place 1 spray into both nostrils in the morning and at bedtime.  ? calcium carbonate (OS-CAL) 600 MG TABS tablet Take 600 mg by mouth daily.   ? denosumab (PROLIA) 60 MG/ML SOLN injection Inject 60 mg into the skin every 6 (six) months. Administer in upper arm, thigh, or abdomen  ? Difluprednate 0.05 % EMUL Place 1 drop into the left eye 2 (two) times daily.  ? diphenoxylate-atropine (LOMOTIL) 2.5-0.025 MG tablet Take 1 tablet by mouth 4 (four) times daily as needed for diarrhea or loose stools.  ? famotidine (  PEPCID) 20 MG tablet Take 20 mg by mouth at bedtime.   ? loratadine (CLARITIN) 10 MG tablet Take 1 tablet (10 mg total) by mouth daily.  ? LORazepam (ATIVAN) 0.5 MG tablet TAKE ONE TABLET ONCE DAILY AS NEEDED FOR ANXIETY  ? Multiple Vitamin (MULTIVITAMIN) tablet Take 1 tablet by mouth every morning.  ? Polyethyl Glycol-Propyl Glycol (SYSTANE OP) Place 1 drop into both eyes 3 (three) times daily as needed (dry eyes).  ? Probiotic Product (ALIGN) 4 MG CAPS Take 1 capsule (4 mg total) by mouth daily.  ? ?No facility-administered encounter medications on file as of 06/26/2021.   ? ? ?Allergies (verified) ?Codeine, Dilaudid [hydromorphone hcl], Latrix xm [urea in zn undecyl-lactic acid], Pedi-pre tape spray [wound dressing adhesive], Sulfa antibiotics, Sulfacetamide, Sulfonamide derivatives, Tape, Hydrocodone-acetaminophen, and Norco [hydrocodone-acetaminophen]  ? ?History: ?Past Medical History:  ?Diagnosis Date  ? Anxiety   ? Complication of anesthesia   ? Congestion of both ears   ? Deviated nasal septum   ? Diverticulitis   ? DVT (deep venous thrombosis) ([place])   ? Gallstones   ? GERD (gastroesophageal reflux disease)   ? Hair loss   ? Hematuria   ? in past  ? History of creation of ostomy [facility])   ? past partial colectomy in 05/03/14  ? History of kidney stones   ? History of skin cancer   ? Kidney cysts   ? Migraines   ? in past  ? Molar pregnancy   ? Molar pregnancy   ? Osteoarthritis   ? fingers   ? Osteoporosis   ? Osteoporosis   ? Persistent cough   ? Pneumonia 2019  ? PONV (postoperative nausea and vomiting)   ? sensitive to medications  ? Skin cancer   ? Unspecified vitamin D deficiency   ? Ventral hernia   ? ?Past Surgical History:  ?Procedure Laterality Date  ? ABDOMINAL HYSTERECTOMY  1972  ? CYSTOSCOPY WITH RETROGRADE PYELOGRAM, URETEROSCOPY AND STENT PLACEMENT Left 05/21/2019  ? Procedure: CYSTOSCOPY WITH RETROGRADE PYELOGRAM, URETEROSCOPY AND STENT PLACEMENT;  Surgeon: Alexis Frock, MD;  Location: [facility];  Service: Urology;  Laterality: Left;  1 HR  ? CYSTOSCOPY WITH RETROGRADE PYELOGRAM, URETEROSCOPY AND STENT PLACEMENT Left 05/31/2020  ? Procedure: CYSTOSCOPY WITH RETROGRADE PYELOGRAM, URETEROSCOPY, BASKET EXTRACTION AND STENT PLACEMENT;  Surgeon: Alexis Frock, MD;  Location: [facility];  Service: Urology;  Laterality: Left;  1 HR  ? CYSTOSCOPY/URETEROSCOPY/HOLMIUM LASER/STENT PLACEMENT Left 02/26/2019  ? Procedure: CYSTOSCOPY/URETEROSCOPY/HOLMIUM LASER/STENT PLACEMENT FIRST STAGE;  Surgeon: Alexis Frock, MD;  Location: [facility];  Service: Urology;  Laterality: Left;  1 HR   ? EYE SURGERY    ? FACIAL COSMETIC SURGERY  2009  ? [name] MD in [place]  ? HOLMIUM LASER APPLICATION Left 05/16/6576  ? Procedure: HOLMIUM LASER APPLICATION;  Surgeon: Alexis Frock, MD;  Location: [facility];  Service: Urology;  Laterality: Left;  ? HOLMIUM LASER APPLICATION Left 4/69/6295  ? Procedure: HOLMIUM LASER APPLICATION;  Surgeon: Alexis Frock, MD;  Location: [facility];  Service: Urology;  Laterality: Left;  ? ILEO LOOP COLOSTOMY CLOSURE N/A 11/08/2014  ? Procedure: LAPAROSCOPIC COLOSTOMY CLOSURE WITH PARTIAL COLECTOMY;  Surgeon: [name], MD;  Location: [facility];  Service: General;  Laterality: N/A;  ? INSERTION OF MESH N/A 08/01/2015  ? Procedure: INSERTION OF MESH;  Surgeon: [name], MD;  Location: [facility];  Service: General;  Laterality: N/A;  ? LAPAROSCOPIC LYSIS OF ADHESIONS N/A 11/08/2014  ? Procedure: LAPAROSCOPIC LYSIS OF ADHESIONS FOR  65 MINUTES;  Surgeon: [name], MD;  Location: [facility];  Service: General;  Laterality: N/A;  ? LAPAROSCOPIC LYSIS OF ADHESIONS N/A 08/01/2015  ? Procedure: LAPAROSCOPIC LYSIS OF ADHESIONS;  Surgeon: [name], MD;  Location: [facility];  Service: General;  Laterality: N/A;  ? LAPAROSCOPIC PARTIAL COLECTOMY N/A 05/03/2014  ? Procedure: DIAGNOSTIC LAPAROSCOPY WITH DRAINAGE OF INTRA ABDOMINAL ABSCESS PARTIAL COLECTOMY ;  Surgeon: [name], MD;  Location: [facility];  Service: General;  Laterality: N/A;  supine position  ? LAPAROTOMY N/A 05/03/2014  ? Procedure: EXPLORATORY LAPAROTOMY WITH HARTMAN PROCEDURE;  Surgeon: [name], MD;  Location[facility];  Service: General;  Laterality: N/A;  ? LIPOMA RESECTION    ? left scapula area  ? OOPHORECTOMY  1976  ? REDUCTION MAMMAPLASTY  1994  ? RHINOPLASTY  1985  ? TONSILLECTOMY    ? VENTRAL HERNIA REPAIR N/A 08/01/2015  ? Procedure: LAPAROSCOPIC VENTRAL INCISIONAL  HERNIA;  Surgeon: [name], MD;  [facility];  Service: General;  Laterality: N/A;  ? ?Family History  ?Problem Relation Age of  Onset  ? Arthritis Mother   ? Colon cancer Mother 18  ? Heart disease Father   ? Dementia Father   ? Diabetes Sister   ? Cancer Maternal Grandfather   ?     Lung cancer  ? Heart disease Maternal [place]

## 2021-06-27 ENCOUNTER — Encounter: Payer: Self-pay | Admitting: Internal Medicine

## 2021-06-27 LAB — HCG, QUANTITATIVE, PREGNANCY: HCG, Total, QN: 10 m[IU]/mL

## 2021-06-27 NOTE — Telephone Encounter (Signed)
Check cover-my-meds rec'd msg stating " This request has been approved. [employer] [place]. Effective from 06/26/2021 through 06/27/2022. Faxed info to [place].Marland KitchenJohny Chess ?

## 2021-06-29 ENCOUNTER — Encounter: Payer: Self-pay | Admitting: Internal Medicine

## 2021-06-30 NOTE — Telephone Encounter (Signed)
Pt ready for scheduling on or after 06/26/21 ? ?Out-of-pocket cost due at time of visit: $276 ? ?Primary: BCBS Medicare ?Prolia co-insurance: 20% (approximately $276) ?Admin fee co-insurance: 20% (approximately $25) ? ?Secondary: n/a ?Prolia co-insurance:  ?Admin fee co-insurance:  ? ?Deductible: does not apply ? ?Prior Auth: APPROVED ?PA#/KEY: [id number] ?Valid: 01/05/21-/01/05/22 ? ? ?** This summary of benefits is an estimation of the patient's out-of-pocket cost. Exact cost may vary based on individual plan coverage.  ? ?

## 2021-07-04 ENCOUNTER — Telehealth: Payer: Self-pay

## 2021-07-04 LAB — COLOGUARD: COLOGUARD: NEGATIVE

## 2021-07-04 NOTE — Telephone Encounter (Signed)
Spoke with Ms. Kristen Castillo regarding her referral to GYN oncology. Dr. Berline Lopes has reviewed her records and recommends an appointment in about 3 months. She has an appointment scheduled with Dr. Berline Lopes on 09/03/21 at [facility]. Patient agrees to date and time. She has been provided with office address and location. She is also aware of our mask and visitor policy. Patient verbalized understanding and will call with any questions.    ?

## 2021-07-05 ENCOUNTER — Encounter: Payer: Self-pay | Admitting: Internal Medicine

## 2021-07-05 ENCOUNTER — Ambulatory Visit (INDEPENDENT_AMBULATORY_CARE_PROVIDER_SITE_OTHER): Payer: Medicare Other | Admitting: *Deleted

## 2021-07-05 DIAGNOSIS — M810 Age-related osteoporosis without current pathological fracture: Secondary | ICD-10-CM | POA: Diagnosis not present

## 2021-07-05 MED ORDER — DENOSUMAB 60 MG/ML ~~LOC~~ SOSY
60.0000 mg | PREFILLED_SYRINGE | Freq: Once | SUBCUTANEOUS | Status: AC
  Administered 2021-07-05: 60 mg via SUBCUTANEOUS

## 2021-07-05 NOTE — Progress Notes (Addendum)
Patient here for her prolia injection. Given in left arm subq. Patient tolerated well  ? ?Please co sign  ? ?Medical screening examination/treatment/procedure(s) were performed by non-physician practitioner and as supervising physician I was immediately available for consultation/collaboration.  I agree with above. Lew Dawes, MD ?

## 2021-07-15 NOTE — Telephone Encounter (Signed)
Last Prolia inj 07/05/21 ?Next Prolia inj due 01/05/22 ?

## 2021-07-25 DIAGNOSIS — L720 Epidermal cyst: Secondary | ICD-10-CM | POA: Diagnosis not present

## 2021-07-25 DIAGNOSIS — L821 Other seborrheic keratosis: Secondary | ICD-10-CM | POA: Diagnosis not present

## 2021-07-25 DIAGNOSIS — L570 Actinic keratosis: Secondary | ICD-10-CM | POA: Diagnosis not present

## 2021-07-25 DIAGNOSIS — Z85828 Personal history of other malignant neoplasm of skin: Secondary | ICD-10-CM | POA: Diagnosis not present

## 2021-07-27 ENCOUNTER — Ambulatory Visit: Payer: Medicare Other | Admitting: Emergency Medicine

## 2021-07-27 ENCOUNTER — Encounter: Payer: Self-pay | Admitting: Emergency Medicine

## 2021-07-27 DIAGNOSIS — Z86711 Personal history of pulmonary embolism: Secondary | ICD-10-CM

## 2021-07-27 NOTE — Assessment & Plan Note (Signed)
Doing well.  Quite active.  No chest pain or any other sequela.  Pulmonary hypertension resolved.  She is planning to maintain Eliquis 2.5 mg twice daily indefinitely.  No signs of bleeding or complication.  We will refill this medication so it will be supplied by our office at her request.  Follow-up in 1 year  We will plan to continue Eliquis 2.5 mg twice a day. We will rewrite your prescription so that it will be refilled through our office. Please call our office if you develop any evidence of bleeding. Follow with Dr. Lamonte Sakai in 1 year or sooner if you have any problems.

## 2021-07-27 NOTE — Patient Instructions (Signed)
We will plan to continue Eliquis 2.5 mg twice a day. We will rewrite your prescription so that it will be refilled through our office. Please call our office if you develop any evidence of bleeding. Follow with Dr. Lamonte Sakai in 1 year or sooner if you have any problems.

## 2021-07-27 NOTE — Progress Notes (Signed)
   Subjective:    Patient ID: Kristen Castillo, female    DOB: June 11, 1940, 81 y.o.   MRN: [id number]  HPI  ROV 07/11/20 --follow-up visit for 81 year old woman, never smoker, whom I have followed for her history of a large pulmonary embolism.  This was a provoked clot that occurred when she had a bowel surgery and mesh placement.  She underwent targeted lysis and had resolution of right heart strain by echocardiogram.  We have been maintaining her on Eliquis 2.5 mg twice daily.  She also has chronic cough in the setting of GERD and allergic rhinitis. Seems to be a bit worse in allergy season. She uses tessalon perles prn.   ROV 07/27/21 --follow-up visit for 81 year old woman with a history of large pulmonary embolism, provoked following bowel surgery and mesh placement.  She required targeted lysis.  She initially had right heart strain that improved on echocardiogram.  We have been maintaining her on Eliquis 2.5 mg twice daily.  She also has a history of chronic cough, GERD, allergic rhinitis.  Today she reports that she has been doing well, she walks 10000 steps a day. Minimal cough.   Review of Systems As per HPI     Objective:   Physical Exam Vitals:   07/27/21 1537  BP: 128/74  Pulse: 75  Temp: 98.1 F (36.7 C)  TempSrc: Oral  SpO2: 98%  Weight: 126 lb (57.2 kg)  Height: '5\' 2"'$  (1.575 m)    Gen: Pleasant, well-nourished, in no distress,  normal affect  ENT: No lesions,  mouth clear,  oropharynx clear, no postnasal drip, strong voice  Neck: No JVD, no stridor  Lungs: No use of accessory muscles, clear B, no wheeze or crackles.   Cardiovascular: RRR, heart sounds normal, no murmur or gallops, no edema  Musculoskeletal: No deformities, no cyanosis or clubbing  Neuro: alert, non focal  Skin: Warm, no lesions or rashes      Assessment & Plan:  History of pulmonary embolus (PE) Doing well.  Quite active.  No chest pain or any other sequela.  Pulmonary hypertension  resolved.  She is planning to maintain Eliquis 2.5 mg twice daily indefinitely.  No signs of bleeding or complication.  We will refill this medication so it will be supplied by our office at her request.  Follow-up in 1 year  We will plan to continue Eliquis 2.5 mg twice a day. We will rewrite your prescription so that it will be refilled through our office. Please call our office if you develop any evidence of bleeding. Follow with Dr. Lamonte Sakai in 1 year or sooner if you have any problems.  Baltazar Apo, MD, PhD 07/27/2021, 3:52 PM [facility] [phone] or if no answer [phone]

## 2021-07-30 DIAGNOSIS — H43391 Other vitreous opacities, right eye: Secondary | ICD-10-CM | POA: Diagnosis not present

## 2021-07-30 DIAGNOSIS — H35352 Cystoid macular degeneration, left eye: Secondary | ICD-10-CM | POA: Diagnosis not present

## 2021-07-30 DIAGNOSIS — H35371 Puckering of macula, right eye: Secondary | ICD-10-CM | POA: Diagnosis not present

## 2021-07-30 DIAGNOSIS — H43811 Vitreous degeneration, right eye: Secondary | ICD-10-CM | POA: Diagnosis not present

## 2021-09-03 ENCOUNTER — Inpatient Hospital Stay: Payer: Medicare Other | Attending: Gynecologic Oncology | Admitting: Gynecologic Oncology

## 2021-09-03 ENCOUNTER — Other Ambulatory Visit: Payer: Self-pay

## 2021-09-03 ENCOUNTER — Encounter: Payer: Self-pay | Admitting: Gynecologic Oncology

## 2021-09-03 VITALS — BP 179/85 | HR 71 | Temp 98.0°F | Resp 16 | Ht 62.0 in | Wt 125.2 lb

## 2021-09-03 DIAGNOSIS — R7989 Other specified abnormal findings of blood chemistry: Secondary | ICD-10-CM | POA: Insufficient documentation

## 2021-09-03 DIAGNOSIS — K219 Gastro-esophageal reflux disease without esophagitis: Secondary | ICD-10-CM | POA: Insufficient documentation

## 2021-09-03 DIAGNOSIS — M810 Age-related osteoporosis without current pathological fracture: Secondary | ICD-10-CM | POA: Diagnosis not present

## 2021-09-03 DIAGNOSIS — Z8759 Personal history of other complications of pregnancy, childbirth and the puerperium: Secondary | ICD-10-CM | POA: Diagnosis not present

## 2021-09-03 DIAGNOSIS — Z90722 Acquired absence of ovaries, bilateral: Secondary | ICD-10-CM | POA: Insufficient documentation

## 2021-09-03 DIAGNOSIS — Z86718 Personal history of other venous thrombosis and embolism: Secondary | ICD-10-CM | POA: Insufficient documentation

## 2021-09-03 DIAGNOSIS — E559 Vitamin D deficiency, unspecified: Secondary | ICD-10-CM | POA: Insufficient documentation

## 2021-09-03 DIAGNOSIS — Z90710 Acquired absence of both cervix and uterus: Secondary | ICD-10-CM | POA: Insufficient documentation

## 2021-09-03 DIAGNOSIS — F419 Anxiety disorder, unspecified: Secondary | ICD-10-CM | POA: Diagnosis not present

## 2021-09-04 DIAGNOSIS — Z8759 Personal history of other complications of pregnancy, childbirth and the puerperium: Secondary | ICD-10-CM | POA: Insufficient documentation

## 2021-09-06 DIAGNOSIS — Z85828 Personal history of other malignant neoplasm of skin: Secondary | ICD-10-CM | POA: Diagnosis not present

## 2021-09-06 DIAGNOSIS — D692 Other nonthrombocytopenic purpura: Secondary | ICD-10-CM | POA: Diagnosis not present

## 2021-09-06 DIAGNOSIS — L814 Other melanin hyperpigmentation: Secondary | ICD-10-CM | POA: Diagnosis not present

## 2021-09-06 DIAGNOSIS — L718 Other rosacea: Secondary | ICD-10-CM | POA: Diagnosis not present

## 2021-10-12 ENCOUNTER — Telehealth: Payer: Self-pay

## 2021-10-12 NOTE — Patient Outreach (Signed)
  Care Coordination   10/12/2021 Name: Kristen Castillo MRN: [id number] DOB: Jan 24, 1941   Care Coordination Outreach Attempts:  An unsuccessful telephone outreach was attempted today to offer the patient information about available care coordination services as a benefit of their health plan.   Follow Up Plan:  Additional outreach attempts will be made to offer the patient care coordination information and services.   Encounter Outcome:  No Answer  Care Coordination Interventions Activated:  No   Care Coordination Interventions:  No, not indicated    Thea Silversmith, RN, MSN, BSN, CCM Care Coordinator [phone]

## 2021-10-23 ENCOUNTER — Encounter: Payer: Self-pay | Admitting: Internal Medicine

## 2021-10-23 ENCOUNTER — Ambulatory Visit (INDEPENDENT_AMBULATORY_CARE_PROVIDER_SITE_OTHER): Payer: Medicare Other | Admitting: Internal Medicine

## 2021-10-23 DIAGNOSIS — N183 Chronic kidney disease, stage 3 unspecified: Secondary | ICD-10-CM | POA: Insufficient documentation

## 2021-10-23 DIAGNOSIS — E559 Vitamin D deficiency, unspecified: Secondary | ICD-10-CM

## 2021-10-23 DIAGNOSIS — M79604 Pain in right leg: Secondary | ICD-10-CM

## 2021-10-23 DIAGNOSIS — C44311 Basal cell carcinoma of skin of nose: Secondary | ICD-10-CM | POA: Insufficient documentation

## 2021-10-23 DIAGNOSIS — Z85828 Personal history of other malignant neoplasm of skin: Secondary | ICD-10-CM | POA: Insufficient documentation

## 2021-10-23 DIAGNOSIS — M79606 Pain in leg, unspecified: Secondary | ICD-10-CM | POA: Insufficient documentation

## 2021-10-23 DIAGNOSIS — F419 Anxiety disorder, unspecified: Secondary | ICD-10-CM

## 2021-10-23 LAB — COMPREHENSIVE METABOLIC PANEL
ALT: 10 U/L (ref 0–35)
AST: 18 U/L (ref 0–37)
Albumin: 3.8 g/dL (ref 3.5–5.2)
Alkaline Phosphatase: 74 U/L (ref 39–117)
BUN: 27 mg/dL — ABNORMAL HIGH (ref 6–23)
CO2: 25 mEq/L (ref 19–32)
Calcium: 8.5 mg/dL (ref 8.4–10.5)
Chloride: 108 mEq/L (ref 96–112)
Creatinine, Ser: 1.1 mg/dL (ref 0.40–1.20)
GFR: 47.33 mL/min — ABNORMAL LOW (ref >60.00)
Glucose, Bld: 102 mg/dL — ABNORMAL HIGH (ref 70–99)
Potassium: 4.2 mEq/L (ref 3.5–5.1)
Sodium: 138 mEq/L (ref 135–145)
Total Bilirubin: 0.4 mg/dL (ref 0.2–1.2)
Total Protein: 7.2 g/dL (ref 6.0–8.3)

## 2021-10-23 MED ORDER — APIXABAN 2.5 MG PO TABS: 2.5000 mg | ORAL_TABLET | Freq: Two times a day (BID) | ORAL | 3 refills | Status: DC

## 2021-10-23 MED ORDER — BENZONATATE 200 MG PO CAPS: ORAL_CAPSULE | ORAL | 3 refills | Status: DC

## 2021-10-23 MED ORDER — BUDESONIDE 32 MCG/ACT NA SUSP: 1.0000 | Freq: Two times a day (BID) | NASAL | 5 refills | Status: AC

## 2021-10-23 MED ORDER — LORAZEPAM 0.5 MG PO TABS: ORAL_TABLET | ORAL | 1 refills | Status: DC

## 2021-10-23 NOTE — Assessment & Plan Note (Addendum)
GFR 47 Hydrate well Nephrol ref

## 2021-10-23 NOTE — Assessment & Plan Note (Signed)
R dist post calf - MSK, chronic Blue-Emu cream  use 2-3 times a day

## 2021-10-23 NOTE — Assessment & Plan Note (Signed)
On Vit D 

## 2021-10-23 NOTE — Assessment & Plan Note (Signed)
Cont w/Lorazepam prn  Potential benefits of a long term benzodiazepines  use as well as potential risks  and complications were explained to the patient and were aknowledged. 

## 2021-10-23 NOTE — Progress Notes (Signed)
Subjective:  Patient ID: Kristen Castillo, female    DOB: December 04, 1940  Age: 81 y.o. MRN: [id number]  CC: No chief complaint on file.   HPI Kristen Castillo presents for anxiety, anticoagulation, chronic cough   Outpatient Medications Prior to Visit  Medication Sig Dispense Refill   allopurinol (ZYLOPRIM) 100 MG tablet Take 1 tablet (100 mg total) by mouth 2 (two) times daily. 180 tablet 3   Bromfenac Sodium (PROLENSA) 0.07 % SOLN Place 1 drop into the left eye daily.     calcium carbonate (OS-CAL) 600 MG TABS tablet Take 600 mg by mouth daily.      denosumab (PROLIA) 60 MG/ML SOLN injection Inject 60 mg into the skin every 6 (six) months. Administer in upper arm, thigh, or abdomen     Difluprednate 0.05 % EMUL Place 1 drop into the left eye 2 (two) times daily.     famotidine (PEPCID) 20 MG tablet Take 20 mg by mouth at bedtime.      Multiple Vitamin (MULTIVITAMIN) tablet Take 1 tablet by mouth every morning.     apixaban (ELIQUIS) 2.5 MG TABS tablet Take 1 tablet (2.5 mg total) by mouth 2 (two) times daily. 180 tablet 3   benzonatate (TESSALON) 200 MG capsule TAKE ONE CAPSULE BY MOUTH THREE TIMES DAILY AS NEEDED FOR COUGH 90 capsule 3   budesonide (RHINOCORT AQUA) 32 MCG/ACT nasal spray Place 1 spray into both nostrils in the morning and at bedtime. 8 mL 5   LORazepam (ATIVAN) 0.5 MG tablet TAKE ONE TABLET ONCE DAILY AS NEEDED FOR ANXIETY 90 tablet 1   No facility-administered medications prior to visit.    ROS: Review of Systems  Constitutional:  Negative for activity change, appetite change, chills, fatigue and unexpected weight change.  HENT:  Negative for congestion, mouth sores and sinus pressure.   Eyes:  Negative for visual disturbance.  Respiratory:  Positive for cough. Negative for chest tightness.   Gastrointestinal:  Negative for abdominal pain and nausea.  Genitourinary:  Negative for difficulty urinating, frequency and vaginal pain.  Musculoskeletal:  Negative  for back pain and gait problem.  Skin:  Negative for pallor and rash.  Neurological:  Negative for dizziness, tremors, weakness, numbness and headaches.  Psychiatric/Behavioral:  Negative for confusion and sleep disturbance.     Objective:  BP (!) 160/102 (BP Location: Left Arm, Patient Position: Sitting, Cuff Size: Normal)   Pulse 79   Temp 98.2 F (36.8 C) (Oral)   Ht '5\' 2"'$  (1.575 m)   Wt 132 lb (59.9 kg)   SpO2 95%   BMI 24.14 kg/m   BP Readings from Last 3 Encounters:  10/23/21 (!) 160/102  09/03/21 (!) 179/85  07/27/21 128/74    Wt Readings from Last 3 Encounters:  10/23/21 132 lb (59.9 kg)  09/03/21 125 lb 3.2 oz (56.8 kg)  07/27/21 126 lb (57.2 kg)    Physical Exam Constitutional:      General: She is not in acute distress.    Appearance: Normal appearance. She is well-developed.  HENT:     Head: Normocephalic.     Right Ear: External ear normal.     Left Ear: External ear normal.     Nose: Nose normal.  Eyes:     General:        Right eye: No discharge.        Left eye: No discharge.     Conjunctiva/sclera: Conjunctivae normal.     Pupils: Pupils are equal,  round, and reactive to light.  Neck:     Thyroid: No thyromegaly.     Vascular: No JVD.     Trachea: No tracheal deviation.  Cardiovascular:     Rate and Rhythm: Normal rate and regular rhythm.     Heart sounds: Normal heart sounds.  Pulmonary:     Effort: No respiratory distress.     Breath sounds: No stridor. No wheezing.  Abdominal:     General: Bowel sounds are normal. There is no distension.     Palpations: Abdomen is soft. There is no mass.     Tenderness: There is no abdominal tenderness. There is no guarding or rebound.  Musculoskeletal:        General: No tenderness.     Cervical back: Normal range of motion and neck supple. No rigidity.  Lymphadenopathy:     Cervical: No cervical adenopathy.  Skin:    Findings: No erythema or rash.  Neurological:     Cranial Nerves: No cranial  nerve deficit.     Motor: No abnormal muscle tone.     Coordination: Coordination normal.     Deep Tendon Reflexes: Reflexes normal.  Psychiatric:        Behavior: Behavior normal.        Thought Content: Thought content normal.        Judgment: Judgment normal.     Lab Results  Component Value Date   WBC 8.2 01/17/2021   HGB 13.3 01/17/2021   HCT 41.4 01/17/2021   PLT 314.0 01/17/2021   GLUCOSE 102 (H) 01/17/2021   CHOL 255 (H) 01/17/2021   TRIG 231.0 (H) 01/17/2021   HDL 69.10 01/17/2021   LDLDIRECT 135.0 01/17/2021   LDLCALC 117 (H) 11/03/2018   ALT 9 01/17/2021   AST 16 01/17/2021   NA 140 01/17/2021   K 4.3 01/17/2021   CL 105 01/17/2021   CREATININE 1.10 01/17/2021   BUN 29 (H) 01/17/2021   CO2 27 01/17/2021   TSH 4.99 01/17/2021   INR 1.3 (H) 05/11/2019   HGBA1C 6.0 (H) 11/03/2014    MM 3D SCREEN BREAST BILATERAL  Result Date: 05/30/2021 CLINICAL DATA:  Screening. EXAM: DIGITAL SCREENING BILATERAL MAMMOGRAM WITH TOMOSYNTHESIS AND CAD TECHNIQUE: Bilateral screening digital craniocaudal and mediolateral oblique mammograms were obtained. Bilateral screening digital breast tomosynthesis was performed. The images were evaluated with computer-aided detection. COMPARISON:  Previous exam(s). ACR Breast Density Category b: There are scattered areas of fibroglandular density. FINDINGS: There are no findings suspicious for malignancy. IMPRESSION: No mammographic evidence of malignancy. A result letter of this screening mammogram will be mailed directly to the patient. RECOMMENDATION: Screening mammogram in one year. (Code:SM-B-01Y) BI-RADS CATEGORY  1: Negative. Electronically Signed   By: Lillia Mountain M.D.   On: 05/30/2021 13:23    Assessment & Plan:   Problem List Items Addressed This Visit     Anxiety, mild    Cont w/Lorazepam prn  Potential benefits of a long term benzodiazepines  use as well as potential risks  and complications were explained to the patient and were  aknowledged.      Relevant Medications   LORazepam (ATIVAN) 0.5 MG tablet   CRI (chronic renal insufficiency), stage 3 (moderate) (HCC)    GFR 47 Hydrate well Nephrol ref       Relevant Orders   Comprehensive metabolic panel   Leg pain    R dist post calf - MSK, chronic Blue-Emu cream  use 2-3 times a day  Vitamin D deficiency    On Vit D         Meds ordered this encounter  Medications   apixaban (ELIQUIS) 2.5 MG TABS tablet    Sig: Take 1 tablet (2.5 mg total) by mouth 2 (two) times daily.    Dispense:  180 tablet    Refill:  3    Pt has requested to have this medication delivered to her home.   budesonide (RHINOCORT AQUA) 32 MCG/ACT nasal spray    Sig: Place 1 spray into both nostrils in the morning and at bedtime.    Dispense:  8 mL    Refill:  5   LORazepam (ATIVAN) 0.5 MG tablet    Sig: TAKE ONE TABLET ONCE DAILY AS NEEDED FOR ANXIETY    Dispense:  90 tablet    Refill:  1   benzonatate (TESSALON) 200 MG capsule    Sig: TAKE ONE CAPSULE BY MOUTH THREE TIMES DAILY AS NEEDED FOR COUGH    Dispense:  90 capsule    Refill:  3      Follow-up: Return in about 6 months (around 04/25/2022) for Wellness Exam.  Walker Kehr, MD

## 2021-10-23 NOTE — Patient Instructions (Addendum)
Blue-Emu cream  use 2-3 times a day  Sign up for [employer] ( via [employer] on your phone or your ipad). If you don't have a Art therapist card  - go to [employer] branch. They will set you up in 15 minutes. It is free. You can check out books to read and to listen, check out magazines and newspapers, movies etc.   Kristen Castillo "Sociopath Next Door"

## 2021-10-25 ENCOUNTER — Encounter: Payer: Self-pay | Admitting: Internal Medicine

## 2021-10-25 DIAGNOSIS — Z85828 Personal history of other malignant neoplasm of skin: Secondary | ICD-10-CM | POA: Diagnosis not present

## 2021-10-25 DIAGNOSIS — L738 Other specified follicular disorders: Secondary | ICD-10-CM | POA: Diagnosis not present

## 2021-10-25 DIAGNOSIS — L821 Other seborrheic keratosis: Secondary | ICD-10-CM | POA: Diagnosis not present

## 2021-11-01 DIAGNOSIS — H43391 Other vitreous opacities, right eye: Secondary | ICD-10-CM | POA: Diagnosis not present

## 2021-11-01 DIAGNOSIS — H43811 Vitreous degeneration, right eye: Secondary | ICD-10-CM | POA: Diagnosis not present

## 2021-11-01 DIAGNOSIS — H35371 Puckering of macula, right eye: Secondary | ICD-10-CM | POA: Diagnosis not present

## 2021-11-01 DIAGNOSIS — H35352 Cystoid macular degeneration, left eye: Secondary | ICD-10-CM | POA: Diagnosis not present

## 2021-11-17 NOTE — Telephone Encounter (Signed)
Prolia VOB initiated via [email or web address] 

## 2021-11-27 ENCOUNTER — Telehealth: Payer: Self-pay | Admitting: Licensed Clinical Social Worker

## 2021-11-27 NOTE — Patient Outreach (Signed)
  Care Coordination   11/27/2021 Name: Kristen Castillo MRN: [id number] DOB: 1940/09/24   Care Coordination Outreach Attempts:  A second unsuccessful outreach was attempted today to offer the patient with information about available care coordination services as a benefit of their health plan.     Follow Up Plan:  Additional outreach attempts will be made to offer the patient care coordination information and services.   Encounter Outcome:  No Answer  Care Coordination Interventions Activated:  No   Care Coordination Interventions:  No, not indicated    Casimer Lanius, [place] [phone]

## 2021-11-28 NOTE — Telephone Encounter (Signed)
Prior auth required for PROLIA  PA PROCESS DETAILS: PA is required for (Buy and Bill/ Specialty Pharmacy) & is not on file. Providers may call Medical Utilization at [phone] to initiate. Forms may be accessed online at [email or web address] & faxed back to [phone] .  

## 2021-12-04 DIAGNOSIS — H6123 Impacted cerumen, bilateral: Secondary | ICD-10-CM | POA: Diagnosis not present

## 2021-12-27 DIAGNOSIS — Z85828 Personal history of other malignant neoplasm of skin: Secondary | ICD-10-CM | POA: Diagnosis not present

## 2021-12-27 DIAGNOSIS — L821 Other seborrheic keratosis: Secondary | ICD-10-CM | POA: Diagnosis not present

## 2021-12-27 DIAGNOSIS — D692 Other nonthrombocytopenic purpura: Secondary | ICD-10-CM | POA: Diagnosis not present

## 2021-12-27 DIAGNOSIS — L814 Other melanin hyperpigmentation: Secondary | ICD-10-CM | POA: Diagnosis not present

## 2022-01-04 ENCOUNTER — Telehealth: Payer: Self-pay

## 2022-01-04 ENCOUNTER — Ambulatory Visit (INDEPENDENT_AMBULATORY_CARE_PROVIDER_SITE_OTHER): Payer: Medicare Other

## 2022-01-04 ENCOUNTER — Encounter: Payer: Self-pay | Admitting: Internal Medicine

## 2022-01-04 DIAGNOSIS — Z23 Encounter for immunization: Secondary | ICD-10-CM

## 2022-01-04 NOTE — Telephone Encounter (Signed)
Pt was scheduled today for a nurse visit for Prolia and flu shot. Flue shot was given.  I did not see documentation that the insurance authorization has been finalized with an on/after date to received Prolia.   Please advise when pt is eligible to receive injection so I can get her rescheduled. Thanks!

## 2022-01-07 NOTE — Telephone Encounter (Signed)
Its time for her prolia, but need insurance part done first.../lmb

## 2022-01-08 ENCOUNTER — Other Ambulatory Visit (HOSPITAL_COMMUNITY): Payer: Self-pay

## 2022-01-08 NOTE — Telephone Encounter (Signed)
Next Prolia inj was due 01/05/22- please initiate Prolia BIV

## 2022-01-08 NOTE — Telephone Encounter (Signed)
Pharmacy Patient Advocate Encounter  Insurance verification completed.    The patient is insured through [employer]   Ran test claims for: Prolia '60mg'$ .  Pharmacy benefit copay: Prior Authorization Required

## 2022-01-08 NOTE — Telephone Encounter (Signed)
   Prolia benefits verification scanned to chart

## 2022-01-10 ENCOUNTER — Other Ambulatory Visit (HOSPITAL_COMMUNITY): Payer: Self-pay

## 2022-01-10 NOTE — Telephone Encounter (Signed)
Medical Benefit:  Faxed Prolia PA Form to [employer]. Will update once we receive a response.  Fax# [phone] Phone# [phone]

## 2022-01-10 NOTE — Telephone Encounter (Signed)
Pharmacy benefit- Submitted a Prior Authorization request to [facility] for PROLIA via CoverMyMeds. Will update once we receive a response.   Key: [id number]

## 2022-01-10 NOTE — Telephone Encounter (Signed)
PROLIA Approved   Effective: 01/10/2022 - 01/11/2023 AUTH #  "KEY" IN COVER MY MEDS [place] at [facility]

## 2022-01-11 NOTE — Telephone Encounter (Signed)
Called pt inform received notification insurance has been verified. Ok to get prolia made appt 01/14/22 @ 9:40.Marland KitchenJohny Chess

## 2022-01-14 ENCOUNTER — Ambulatory Visit: Payer: Medicare Other

## 2022-01-14 ENCOUNTER — Ambulatory Visit (INDEPENDENT_AMBULATORY_CARE_PROVIDER_SITE_OTHER): Payer: Medicare Other

## 2022-01-14 DIAGNOSIS — M810 Age-related osteoporosis without current pathological fracture: Secondary | ICD-10-CM | POA: Diagnosis not present

## 2022-01-14 MED ORDER — DENOSUMAB 60 MG/ML ~~LOC~~ SOSY
60.0000 mg | PREFILLED_SYRINGE | Freq: Once | SUBCUTANEOUS | Status: AC
  Administered 2022-01-14: 60 mg via SUBCUTANEOUS

## 2022-01-14 NOTE — Progress Notes (Signed)
Pt received in the right arm. No reaction pt responded well.

## 2022-01-15 DIAGNOSIS — H524 Presbyopia: Secondary | ICD-10-CM | POA: Diagnosis not present

## 2022-01-16 ENCOUNTER — Ambulatory Visit (INDEPENDENT_AMBULATORY_CARE_PROVIDER_SITE_OTHER): Payer: Medicare Other | Admitting: Obstetrics and Gynecology

## 2022-01-16 ENCOUNTER — Encounter: Payer: Self-pay | Admitting: Obstetrics and Gynecology

## 2022-01-16 VITALS — BP 110/78 | HR 68 | Ht 62.0 in | Wt 133.0 lb

## 2022-01-16 DIAGNOSIS — Z01419 Encounter for gynecological examination (general) (routine) without abnormal findings: Secondary | ICD-10-CM

## 2022-01-16 DIAGNOSIS — Z8759 Personal history of other complications of pregnancy, childbirth and the puerperium: Secondary | ICD-10-CM | POA: Diagnosis not present

## 2022-01-16 DIAGNOSIS — R350 Frequency of micturition: Secondary | ICD-10-CM | POA: Diagnosis not present

## 2022-01-16 DIAGNOSIS — Z719 Counseling, unspecified: Secondary | ICD-10-CM

## 2022-01-16 DIAGNOSIS — N183 Chronic kidney disease, stage 3 unspecified: Secondary | ICD-10-CM | POA: Diagnosis not present

## 2022-01-16 DIAGNOSIS — Z9189 Other specified personal risk factors, not elsewhere classified: Secondary | ICD-10-CM

## 2022-01-16 DIAGNOSIS — R319 Hematuria, unspecified: Secondary | ICD-10-CM | POA: Diagnosis not present

## 2022-01-16 NOTE — Progress Notes (Signed)
81 y.o. G22P0010 Married Caucasian female here for an office visit.   She is followed for a hx of molar pregnancy.  She is status post TAH/BSO.   She likes to have her hCG monitored yearly.  Last year I had her seen GYN ONC who recommended no further work up, but did state that hCG can be monitored yearly if strongly desired.  Last hCG was 10 on 06/26/21.   She does not want an hCG want at this visit.   She would like to do yearly breast and pelvic exams.  She understands that insurance will not pay for this yearly visit.   She has some general urinary frequency.  Sees Dr. Tresa Moore for microscopic hematuria and kidney stones.   Takes Alupurinol.  Next visit in March. No urinary incontinence.  Up once a night to void.  She is monitoring bathrooms.   Asking about her renal function.  Creatinine 1.10 and GFR 47.33 on 10/23/21.  Sees Dr. Alain Marion next week.   PCP:   Dr. Alain Marion.   No LMP recorded. Patient has had a hysterectomy.           Sexually active: No.  The current method of family planning is post menopausal status.    Exercising: Yes.    Home exercise routine includes walking 1 hrs per day. Smoker:  no  Health Maintenance: Pap:   12-04-17 Neg, 10-26-14 unsatisfactory,   History of abnormal Pap:  no MMG:  05/30/2021  BI-RADS CATEGORY  1: Negative.  Colonoscopy:  08/23/2021 BMD:   05-03-20  Result :Osteoporosis - PCP - Prolia.  TDaP:  2021 Gardasil:   no HIV: no  Hep C: no Screening Labs:  PCP   reports that she has never smoked. She has never used smokeless tobacco. She reports that she does not drink alcohol and does not use drugs.  Past Medical History:  Diagnosis Date   Anxiety    Complication of anesthesia    Congestion of both ears    Deviated nasal septum    Diverticulitis    DVT (deep venous thrombosis) (HCC)    Gallstones    GERD (gastroesophageal reflux disease)    Hair loss    Hematuria    in past   History of creation of ostomy ([place])    past  partial colectomy in 05/03/14   History of diverticulitis    History of kidney stones    History of skin cancer    Kidney cysts    Migraines    in past   Molar pregnancy    Osteoarthritis    fingers    Osteoporosis    Osteoporosis    Persistent cough    Pneumonia 2019   PONV (postoperative nausea and vomiting)    sensitive to medications   Skin cancer    Unspecified vitamin D deficiency    Ventral hernia     Past Surgical History:  Procedure Laterality Date   ABDOMINAL HYSTERECTOMY  1972   CYSTOSCOPY WITH RETROGRADE PYELOGRAM, URETEROSCOPY AND STENT PLACEMENT Left 05/21/2019   Procedure: CYSTOSCOPY WITH RETROGRADE PYELOGRAM, URETEROSCOPY AND STENT PLACEMENT;  Surgeon: Alexis Frock, MD;  Location: [facility];  Service: Urology;  Laterality: Left;  1 HR   CYSTOSCOPY WITH RETROGRADE PYELOGRAM, URETEROSCOPY AND STENT PLACEMENT Left 05/31/2020   Procedure: CYSTOSCOPY WITH RETROGRADE PYELOGRAM, URETEROSCOPY, BASKET EXTRACTION AND STENT PLACEMENT;  Surgeon: Alexis Frock, MD;  Location: [facility];  Service: Urology;  Laterality: Left;  1 HR   CYSTOSCOPY/URETEROSCOPY/HOLMIUM LASER/STENT PLACEMENT  Left 02/26/2019   Procedure: CYSTOSCOPY/URETEROSCOPY/HOLMIUM LASER/STENT PLACEMENT FIRST STAGE;  Surgeon: Alexis Frock, MD;  Location: [facility];  Service: Urology;  Laterality: Left;  1 HR   EYE SURGERY     FACIAL COSMETIC SURGERY  2009   [name] MD in [place] Left 3/50/0938   Procedure: HOLMIUM LASER APPLICATION;  Surgeon: Alexis Frock, MD;  Location: [facility];  Service: Urology;  Laterality: Left;   HOLMIUM LASER APPLICATION Left 1/82/9937   Procedure: HOLMIUM LASER APPLICATION;  Surgeon: Alexis Frock, MD;  Location: [facility];  Service: Urology;  Laterality: Left;   ILEO LOOP COLOSTOMY CLOSURE N/A 11/08/2014   Procedure: LAPAROSCOPIC COLOSTOMY CLOSURE WITH PARTIAL COLECTOMY;  Surgeon: [name], MD;  Location: [facility];  Service: General;  Laterality: N/A;    INSERTION OF MESH N/A 08/01/2015   Procedure: INSERTION OF MESH;  Surgeon: [name], MD;  Location: [facility];  Service: General;  Laterality: N/A;   LAPAROSCOPIC LYSIS OF ADHESIONS N/A 11/08/2014   Procedure: LAPAROSCOPIC LYSIS OF ADHESIONS FOR 90 MINUTES;  Surgeon: [name], MD;  Location: [facility];  Service: General;  Laterality: N/A;   LAPAROSCOPIC LYSIS OF ADHESIONS N/A 08/01/2015   Procedure: LAPAROSCOPIC LYSIS OF ADHESIONS;  Surgeon: [name], MD;  Location: [facility];  Service: General;  Laterality: N/A;   LAPAROSCOPIC PARTIAL COLECTOMY N/A 05/03/2014   Procedure: DIAGNOSTIC LAPAROSCOPY WITH DRAINAGE OF INTRA ABDOMINAL ABSCESS PARTIAL COLECTOMY ;  Surgeon: [name], MD;  Location: [facility];  Service: General;  Laterality: N/A;  supine position   LAPAROTOMY N/A 05/03/2014   Procedure: EXPLORATORY LAPAROTOMY WITH HARTMAN PROCEDURE;  Surgeon: [name], MD;  Location: [facility];  Service: General;  Laterality: N/A;   LIPOMA RESECTION     left scapula area   [place] N/A 08/01/2015   Procedure: LAPAROSCOPIC VENTRAL INCISIONAL  HERNIA;  Surgeon: [name], MD;  Location: [facility];  Service: General;  Laterality: N/A;    Current Outpatient Medications  Medication Sig Dispense Refill   allopurinol (ZYLOPRIM) 100 MG tablet Take 1 tablet (100 mg total) by mouth 2 (two) times daily. 180 tablet 3   apixaban (ELIQUIS) 2.5 MG TABS tablet Take 1 tablet (2.5 mg total) by mouth 2 (two) times daily. 180 tablet 3   benzonatate (TESSALON) 200 MG capsule TAKE ONE CAPSULE BY MOUTH THREE TIMES DAILY AS NEEDED FOR COUGH 90 capsule 3   Bromfenac Sodium (PROLENSA) 0.07 % SOLN Place 1 drop into the left eye daily.     budesonide (RHINOCORT AQUA) 32 MCG/ACT nasal spray Place 1 spray into both nostrils in the morning and at bedtime. 8 mL 5   calcium carbonate (OS-CAL) 600 MG TABS tablet  Take 600 mg by mouth daily.      denosumab (PROLIA) 60 MG/ML SOLN injection Inject 60 mg into the skin every 6 (six) months. Administer in upper arm, thigh, or abdomen     Difluprednate 0.05 % EMUL Place 1 drop into the left eye 2 (two) times daily.     famotidine (PEPCID) 20 MG tablet Take 20 mg by mouth at bedtime.      LORazepam (ATIVAN) 0.5 MG tablet TAKE ONE TABLET ONCE DAILY AS NEEDED FOR ANXIETY 90 tablet 1   Multiple Vitamin (MULTIVITAMIN) tablet Take 1 tablet by mouth every morning.     No current facility-administered medications for  this visit.    Family History  Problem Relation Age of Onset   Arthritis Mother    Colon cancer Mother 32   Heart disease Father    Dementia Father    Diabetes Sister    Cancer Maternal Grandfather        Lung cancer   Heart disease Maternal Grandmother    Stomach cancer Neg Hx     Review of Systems  All other systems reviewed and are negative.   Exam:   BP 110/78 (BP Location: Right Arm, Patient Position: Sitting, Cuff Size: Normal)   Pulse 68   Ht '5\' 2"'$  (1.575 m)   Wt 133 lb (60.3 kg)   BMI 24.33 kg/m     General appearance: alert, cooperative and appears stated age Head: normocephalic, without obvious abnormality, atraumatic Neck: no adenopathy, supple, symmetrical, trachea midline and thyroid normal to inspection and palpation Lungs: clear to auscultation bilaterally Breasts: consistent with reduction, no masses or tenderness, No nipple retraction or dimpling, No nipple discharge or bleeding, No axillary adenopathy Heart: regular rate and rhythm Abdomen: multiple incisions well healed.  Abdomen is soft, non-tender; no masses, no organomegaly Extremities: extremities normal, atraumatic, no cyanosis or edema Skin: skin color, texture, turgor normal. No rashes or lesions Lymph nodes: cervical, supraclavicular, and axillary nodes normal. Neurologic: grossly normal  Pelvic: External genitalia:  no lesions              No abnormal  inguinal nodes palpated.              Urethra:  normal appearing urethra with no masses, tenderness or lesions              Bartholins and Skenes: normal                 Vagina: normal appearing vagina with normal color and discharge, no lesions              Cervix:  absent              Pap taken: no Bimanual Exam:  Uterus:  absent              Adnexa: no mass, fullness, tenderness              Rectal exam: yes.  Confirms.              Anus:  normal sphincter tone, no lesions  Chaperone was present for exam:  Kimalexis, RNA  Assessment:   Well woman visit with gynecologic exam. Status post TAH for molar pregnancy in 1970.  Benign.  Status post BSO in 1976.  Benign.  Hx DVT/PE following surgeries.  On Eliquis.  Osteoporosis.   Followed by PCP.  On Prolia.  FH colon cancer in mother.  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  Status post bilateral breast reduction.  Urinary frequency.  Hx microscopic hematuria and renal stones.  Low GFR.  Plan: Mammogram screening discussed. Self breast awareness reviewed. Pap and HR HPV not indicated.  I am comfortable with her not doing hCG measurements.  She would like to skip it for this year and do it next year.  Guidelines for Calcium, Vitamin D, regular exercise program including cardiovascular and weight bearing exercise. We discussed urinary frequency and possible medication for overactive bladder through urology.  Renal function reviewed.   Follow up annually and prn.   After visit summary provided.   39 min  total time was spent  for this patient encounter, including preparation, face-to-face counseling with the patient, coordination of care, and documentation of the encounter.

## 2022-01-16 NOTE — Patient Instructions (Signed)

## 2022-01-18 NOTE — Telephone Encounter (Signed)
Forwarding to Rx Prior Auth Team 

## 2022-01-21 DIAGNOSIS — H35352 Cystoid macular degeneration, left eye: Secondary | ICD-10-CM | POA: Diagnosis not present

## 2022-01-21 DIAGNOSIS — H43391 Other vitreous opacities, right eye: Secondary | ICD-10-CM | POA: Diagnosis not present

## 2022-01-21 DIAGNOSIS — H35371 Puckering of macula, right eye: Secondary | ICD-10-CM | POA: Diagnosis not present

## 2022-01-21 DIAGNOSIS — H43811 Vitreous degeneration, right eye: Secondary | ICD-10-CM | POA: Diagnosis not present

## 2022-01-21 LAB — HM DIABETES EYE EXAM

## 2022-01-23 ENCOUNTER — Ambulatory Visit (INDEPENDENT_AMBULATORY_CARE_PROVIDER_SITE_OTHER): Payer: Medicare Other | Admitting: Internal Medicine

## 2022-01-23 ENCOUNTER — Encounter: Payer: Self-pay | Admitting: Internal Medicine

## 2022-01-23 VITALS — BP 124/82 | HR 70 | Temp 97.7°F | Ht 62.0 in | Wt 135.0 lb

## 2022-01-23 DIAGNOSIS — N1832 Chronic kidney disease, stage 3b: Secondary | ICD-10-CM

## 2022-01-23 DIAGNOSIS — F419 Anxiety disorder, unspecified: Secondary | ICD-10-CM

## 2022-01-23 DIAGNOSIS — K862 Cyst of pancreas: Secondary | ICD-10-CM | POA: Diagnosis not present

## 2022-01-23 DIAGNOSIS — N183 Chronic kidney disease, stage 3 unspecified: Secondary | ICD-10-CM

## 2022-01-23 LAB — COMPREHENSIVE METABOLIC PANEL
ALT: 10 U/L (ref 0–35)
AST: 18 U/L (ref 0–37)
Albumin: 3.9 g/dL (ref 3.5–5.2)
Alkaline Phosphatase: 68 U/L (ref 39–117)
BUN: 29 mg/dL — ABNORMAL HIGH (ref 6–23)
CO2: 28 mEq/L (ref 19–32)
Calcium: 9.1 mg/dL (ref 8.4–10.5)
Chloride: 109 mEq/L (ref 96–112)
Creatinine, Ser: 1.09 mg/dL (ref 0.40–1.20)
GFR: 47.77 mL/min — ABNORMAL LOW (ref >60.00)
Glucose, Bld: 98 mg/dL (ref 70–99)
Potassium: 4.2 mEq/L (ref 3.5–5.1)
Sodium: 140 mEq/L (ref 135–145)
Total Bilirubin: 0.4 mg/dL (ref 0.2–1.2)
Total Protein: 7.4 g/dL (ref 6.0–8.3)

## 2022-01-23 LAB — CBC WITH DIFFERENTIAL/PLATELET
Basophils Absolute: 0.1 10*3/uL (ref 0.0–0.1)
Basophils Relative: 1.2 % (ref 0.0–3.0)
Eosinophils Absolute: 0.2 10*3/uL (ref 0.0–0.7)
Eosinophils Relative: 2.3 % (ref 0.0–5.0)
HCT: 40.3 % (ref 36.0–46.0)
Hemoglobin: 13.4 g/dL (ref 12.0–15.0)
Lymphocytes Relative: 21.4 % (ref 12.0–46.0)
Lymphs Abs: 1.7 10*3/uL (ref 0.7–4.0)
MCHC: 33.2 g/dL (ref 30.0–36.0)
MCV: 91.1 fl (ref 78.0–100.0)
Monocytes Absolute: 0.5 10*3/uL (ref 0.1–1.0)
Monocytes Relative: 6.7 % (ref 3.0–12.0)
Neutro Abs: 5.5 10*3/uL (ref 1.4–7.7)
Neutrophils Relative %: 68.4 % (ref 43.0–77.0)
Platelets: 294 10*3/uL (ref 150.0–400.0)
RBC: 4.43 Mil/uL (ref 3.87–5.11)
RDW: 14.8 % (ref 11.5–15.5)
WBC: 8 10*3/uL (ref 4.0–10.5)

## 2022-01-23 NOTE — Progress Notes (Signed)
Subjective:  Patient ID: Kristen Castillo, female    DOB: Feb 12, 1941  Age: 81 y.o. MRN: [id number]  CC: Annual Exam   HPI [name] presents for h/o DVT, anticoagulation, GERD, anxiety  Outpatient Medications Prior to Visit  Medication Sig Dispense Refill   allopurinol (ZYLOPRIM) 100 MG tablet Take 1 tablet (100 mg total) by mouth 2 (two) times daily. 180 tablet 3   apixaban (ELIQUIS) 2.5 MG TABS tablet Take 1 tablet (2.5 mg total) by mouth 2 (two) times daily. 180 tablet 3   benzonatate (TESSALON) 200 MG capsule TAKE ONE CAPSULE BY MOUTH THREE TIMES DAILY AS NEEDED FOR COUGH (Patient taking differently: Take 200 mg by mouth daily. TAKE ONE CAPSULE BY MOUTH THREE TIMES DAILY AS NEEDED FOR COUGH) 90 capsule 3   Bromfenac Sodium (PROLENSA) 0.07 % SOLN Place 1 drop into the left eye daily.     budesonide (RHINOCORT AQUA) 32 MCG/ACT nasal spray Place 1 spray into both nostrils in the morning and at bedtime. (Patient taking differently: Place 1 spray into both nostrils in the morning and at bedtime. Left nostril) 8 mL 5   calcium carbonate (OS-CAL) 600 MG TABS tablet Take 600 mg by mouth daily.      cephALEXin (KEFLEX) 500 MG capsule Take 500 mg by mouth 2 (two) times daily.     Cholecalciferol (VITAMIN D-1000 MAX ST) 25 MCG (1000 UT) tablet Take 1,000 Units by mouth daily.     denosumab (PROLIA) 60 MG/ML SOLN injection Inject 60 mg into the skin every 6 (six) months. Administer in upper arm, thigh, or abdomen     Difluprednate 0.05 % EMUL Place 1 drop into the left eye 2 (two) times daily.     famotidine (PEPCID) 20 MG tablet Take 20 mg by mouth at bedtime.      LORazepam (ATIVAN) 0.5 MG tablet TAKE ONE TABLET ONCE DAILY AS NEEDED FOR ANXIETY 90 tablet 1   Multiple Vitamin (MULTIVITAMIN) tablet Take 1 tablet by mouth every morning.     nystatin-triamcinolone ointment (MYCOLOG) Apply 1 Application topically 2 (two) times daily.     tamsulosin (FLOMAX) 0.4 MG CAPS capsule Take 0.4  mg by mouth daily.     No facility-administered medications prior to visit.    ROS: Review of Systems  Constitutional:  Negative for activity change, appetite change, chills, fatigue and unexpected weight change.  HENT:  Negative for congestion, mouth sores and sinus pressure.   Eyes:  Negative for visual disturbance.  Respiratory:  Negative for cough and chest tightness.   Gastrointestinal:  Negative for abdominal pain and nausea.  Genitourinary:  Negative for difficulty urinating, frequency and vaginal pain.  Musculoskeletal:  Negative for back pain and gait problem.  Skin:  Negative for pallor and rash.  Neurological:  Negative for dizziness, tremors, weakness, numbness and headaches.  Psychiatric/Behavioral:  Negative for confusion and sleep disturbance.     Objective:  BP 124/82 (BP Location: Left Arm, Patient Position: Sitting, Cuff Size: Normal)   Pulse 70   Temp 97.7 F (36.5 C) (Oral)   Ht '5\' 2"'$  (1.575 m)   Wt 135 lb (61.2 kg)   SpO2 99%   BMI 24.69 kg/m   BP Readings from Last 3 Encounters:  01/23/22 124/82  01/16/22 110/78  10/23/21 (!) 160/102    Wt Readings from Last 3 Encounters:  01/23/22 135 lb (61.2 kg)  01/16/22 133 lb (60.3 kg)  10/23/21 132 lb (59.9 kg)    Physical Exam  Constitutional:      General: She is not in acute distress.    Appearance: She is well-developed. She is obese.  HENT:     Head: Normocephalic.     Right Ear: External ear normal.     Left Ear: External ear normal.     Nose: Nose normal.  Eyes:     General:        Right eye: No discharge.        Left eye: No discharge.     Conjunctiva/sclera: Conjunctivae normal.     Pupils: Pupils are equal, round, and reactive to light.  Neck:     Thyroid: No thyromegaly.     Vascular: No JVD.     Trachea: No tracheal deviation.  Cardiovascular:     Rate and Rhythm: Normal rate and regular rhythm.     Heart sounds: Normal heart sounds.  Pulmonary:     Effort: No respiratory  distress.     Breath sounds: No stridor. No wheezing.  Abdominal:     General: Bowel sounds are normal. There is no distension.     Palpations: Abdomen is soft. There is no mass.     Tenderness: There is no abdominal tenderness. There is no guarding or rebound.  Musculoskeletal:        General: No tenderness.     Cervical back: Normal range of motion and neck supple. No rigidity.  Lymphadenopathy:     Cervical: No cervical adenopathy.  Skin:    Findings: No erythema or rash.  Neurological:     Mental Status: She is oriented to person, place, and time.     Cranial Nerves: No cranial nerve deficit.     Motor: No abnormal muscle tone.     Coordination: Coordination normal.     Deep Tendon Reflexes: Reflexes normal.  Psychiatric:        Behavior: Behavior normal.        Thought Content: Thought content normal.        Judgment: Judgment normal.     Lab Results  Component Value Date   WBC 8.2 01/17/2021   HGB 13.3 01/17/2021   HCT 41.4 01/17/2021   PLT 314.0 01/17/2021   GLUCOSE 102 (H) 10/23/2021   CHOL 255 (H) 01/17/2021   TRIG 231.0 (H) 01/17/2021   HDL 69.10 01/17/2021   LDLDIRECT 135.0 01/17/2021   LDLCALC 117 (H) 11/03/2018   ALT 10 10/23/2021   AST 18 10/23/2021   NA 138 10/23/2021   K 4.2 10/23/2021   CL 108 10/23/2021   CREATININE 1.10 10/23/2021   BUN 27 (H) 10/23/2021   CO2 25 10/23/2021   TSH 4.99 01/17/2021   INR 1.3 (H) 05/11/2019   HGBA1C 6.0 (H) 11/03/2014    MM 3D SCREEN BREAST BILATERAL  Result Date: 05/30/2021 CLINICAL DATA:  Screening. EXAM: DIGITAL SCREENING BILATERAL MAMMOGRAM WITH TOMOSYNTHESIS AND CAD TECHNIQUE: Bilateral screening digital craniocaudal and mediolateral oblique mammograms were obtained. Bilateral screening digital breast tomosynthesis was performed. The images were evaluated with computer-aided detection. COMPARISON:  Previous exam(s). ACR Breast Density Category b: There are scattered areas of fibroglandular density. FINDINGS:  There are no findings suspicious for malignancy. IMPRESSION: No mammographic evidence of malignancy. A result letter of this screening mammogram will be mailed directly to the patient. RECOMMENDATION: Screening mammogram in one year. (Code:SM-B-01Y) BI-RADS CATEGORY  1: Negative. Electronically Signed   By: Lillia Mountain M.D.   On: 05/30/2021 13:23    Assessment & Plan:   Problem  List Items Addressed This Visit     Anxiety, mild    Cont w/Lorazepam prn  Potential benefits of a long term benzodiazepines  use as well as potential risks  and complications were explained to the patient and were aknowledged.      CRI (chronic renal insufficiency) - Primary    St 3a Discussed GFR 47 Nephrolology ref Reduce Allopurinol if advised by nephrology       Relevant Orders   Ambulatory referral to Nephrology   Pancreas cyst    It is recommended to repeat abdominal MRI in 2 years (11/2022).      CRI (chronic renal insufficiency), stage 3 (moderate) (HCC)    GFR 47 St 3 a-b Hydrate well Nephrol ref          No orders of the defined types were placed in this encounter.     Follow-up: Return in about 6 months (around 07/24/2022) for a follow-up visit.  Walker Kehr, MD

## 2022-01-23 NOTE — Assessment & Plan Note (Signed)
GFR 47 St 3 a-b Hydrate well Nephrol ref

## 2022-01-23 NOTE — Assessment & Plan Note (Signed)
It is recommended to repeat abdominal MRI in 2 years (11/2022).

## 2022-01-23 NOTE — Assessment & Plan Note (Signed)
Cont w/Lorazepam prn  Potential benefits of a long term benzodiazepines  use as well as potential risks  and complications were explained to the patient and were aknowledged.

## 2022-01-23 NOTE — Assessment & Plan Note (Addendum)
St 3a Discussed GFR 47 Nephrolology ref Reduce Allopurinol if advised by nephrology

## 2022-01-25 DIAGNOSIS — L308 Other specified dermatitis: Secondary | ICD-10-CM | POA: Diagnosis not present

## 2022-01-25 DIAGNOSIS — C44319 Basal cell carcinoma of skin of other parts of face: Secondary | ICD-10-CM | POA: Diagnosis not present

## 2022-01-25 DIAGNOSIS — Z85828 Personal history of other malignant neoplasm of skin: Secondary | ICD-10-CM | POA: Diagnosis not present

## 2022-01-25 DIAGNOSIS — L718 Other rosacea: Secondary | ICD-10-CM | POA: Diagnosis not present

## 2022-01-26 ENCOUNTER — Encounter: Payer: Self-pay | Admitting: Internal Medicine

## 2022-01-28 NOTE — Telephone Encounter (Signed)
Last Prolia injection 01/14/22 Next Prolia injection due 07/14/22

## 2022-02-06 ENCOUNTER — Telehealth: Payer: Self-pay

## 2022-02-06 NOTE — Patient Outreach (Signed)
  Care Coordination   02/06/2022 Name: Kristen Castillo MRN: [id number] DOB: 1941-01-13   Care Coordination Outreach Attempts:  A third unsuccessful outreach was attempted today to offer the patient with information about available care coordination services as a benefit of their health plan.   Follow Up Plan:  No further outreach attempts will be made at this time. We have been unable to contact the patient to offer or enroll patient in care coordination services  Encounter Outcome:  No Answer   Care Coordination Interventions:  No, not indicated    Thea Silversmith, RN, MSN, BSN, [place] Coordinator [phone]

## 2022-02-20 DIAGNOSIS — Z85828 Personal history of other malignant neoplasm of skin: Secondary | ICD-10-CM | POA: Diagnosis not present

## 2022-02-20 DIAGNOSIS — L308 Other specified dermatitis: Secondary | ICD-10-CM | POA: Diagnosis not present

## 2022-02-20 DIAGNOSIS — L820 Inflamed seborrheic keratosis: Secondary | ICD-10-CM | POA: Diagnosis not present

## 2022-02-28 ENCOUNTER — Encounter: Payer: Self-pay | Admitting: Internal Medicine

## 2022-03-14 DIAGNOSIS — L718 Other rosacea: Secondary | ICD-10-CM | POA: Diagnosis not present

## 2022-03-14 DIAGNOSIS — L218 Other seborrheic dermatitis: Secondary | ICD-10-CM | POA: Diagnosis not present

## 2022-03-19 ENCOUNTER — Telehealth: Payer: Self-pay | Admitting: Emergency Medicine

## 2022-03-19 NOTE — Telephone Encounter (Signed)
She needs to discuss timing of stopping the Eliquis with her surgeon. From pulmonary perspective, she can safely hold the Eliquis for as long as they deem necessary

## 2022-03-19 NOTE — Telephone Encounter (Signed)
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  Pt is having the surgery scheduled 1/15. Dr. Lamonte Sakai, please advise on this.   Pt said if she does not answer when we call back that a message can be left on her phone.

## 2022-03-20 NOTE — Telephone Encounter (Signed)
Per DPR left message on machine stating provider recommendations  Nothing further

## 2022-03-25 DIAGNOSIS — C4401 Basal cell carcinoma of skin of lip: Secondary | ICD-10-CM | POA: Diagnosis not present

## 2022-04-08 DIAGNOSIS — R809 Proteinuria, unspecified: Secondary | ICD-10-CM | POA: Diagnosis not present

## 2022-04-08 DIAGNOSIS — N200 Calculus of kidney: Secondary | ICD-10-CM | POA: Diagnosis not present

## 2022-04-08 DIAGNOSIS — N1831 Chronic kidney disease, stage 3a: Secondary | ICD-10-CM | POA: Diagnosis not present

## 2022-04-08 DIAGNOSIS — R030 Elevated blood-pressure reading, without diagnosis of hypertension: Secondary | ICD-10-CM | POA: Diagnosis not present

## 2022-04-09 ENCOUNTER — Encounter: Payer: Self-pay | Admitting: Internal Medicine

## 2022-04-10 LAB — LAB REPORT - SCANNED
Creatinine, POC: 114.9 mg/dL
EGFR: 45
Protein/Creatinine Ratio: 2891
Total Protein: 332.2 g/dL

## 2022-04-11 ENCOUNTER — Encounter: Payer: Self-pay | Admitting: Internal Medicine

## 2022-04-18 ENCOUNTER — Ambulatory Visit (INDEPENDENT_AMBULATORY_CARE_PROVIDER_SITE_OTHER): Payer: Medicare Other | Admitting: Internal Medicine

## 2022-04-18 ENCOUNTER — Encounter: Payer: Self-pay | Admitting: Internal Medicine

## 2022-04-18 VITALS — BP 98/60 | HR 75 | Temp 97.5°F | Ht 62.0 in | Wt 137.0 lb

## 2022-04-18 DIAGNOSIS — D509 Iron deficiency anemia, unspecified: Secondary | ICD-10-CM

## 2022-04-18 DIAGNOSIS — R739 Hyperglycemia, unspecified: Secondary | ICD-10-CM | POA: Diagnosis not present

## 2022-04-18 DIAGNOSIS — N200 Calculus of kidney: Secondary | ICD-10-CM

## 2022-04-18 DIAGNOSIS — I82401 Acute embolism and thrombosis of unspecified deep veins of right lower extremity: Secondary | ICD-10-CM

## 2022-04-18 DIAGNOSIS — K219 Gastro-esophageal reflux disease without esophagitis: Secondary | ICD-10-CM

## 2022-04-18 DIAGNOSIS — E559 Vitamin D deficiency, unspecified: Secondary | ICD-10-CM

## 2022-04-18 DIAGNOSIS — K862 Cyst of pancreas: Secondary | ICD-10-CM

## 2022-04-18 DIAGNOSIS — N183 Chronic kidney disease, stage 3 unspecified: Secondary | ICD-10-CM | POA: Diagnosis not present

## 2022-04-18 LAB — CBC WITH DIFFERENTIAL/PLATELET
Basophils Absolute: 0.1 10*3/uL (ref 0.0–0.1)
Basophils Relative: 1.3 % (ref 0.0–3.0)
Eosinophils Absolute: 0.2 10*3/uL (ref 0.0–0.7)
Eosinophils Relative: 2.6 % (ref 0.0–5.0)
HCT: 40.3 % (ref 36.0–46.0)
Hemoglobin: 13.4 g/dL (ref 12.0–15.0)
Lymphocytes Relative: 18.2 % (ref 12.0–46.0)
Lymphs Abs: 1.5 10*3/uL (ref 0.7–4.0)
MCHC: 33.2 g/dL (ref 30.0–36.0)
MCV: 90.9 fl (ref 78.0–100.0)
Monocytes Absolute: 0.6 10*3/uL (ref 0.1–1.0)
Monocytes Relative: 7.6 % (ref 3.0–12.0)
Neutro Abs: 5.8 10*3/uL (ref 1.4–7.7)
Neutrophils Relative %: 70.3 % (ref 43.0–77.0)
Platelets: 342 10*3/uL (ref 150.0–400.0)
RBC: 4.43 Mil/uL (ref 3.87–5.11)
RDW: 14.7 % (ref 11.5–15.5)
WBC: 8.3 10*3/uL (ref 4.0–10.5)

## 2022-04-18 LAB — COMPREHENSIVE METABOLIC PANEL
ALT: 10 U/L (ref 0–35)
AST: 16 U/L (ref 0–37)
Albumin: 3.9 g/dL (ref 3.5–5.2)
Alkaline Phosphatase: 68 U/L (ref 39–117)
BUN: 34 mg/dL — ABNORMAL HIGH (ref 6–23)
CO2: 24 mEq/L (ref 19–32)
Calcium: 9.9 mg/dL (ref 8.4–10.5)
Chloride: 105 mEq/L (ref 96–112)
Creatinine, Ser: 1.22 mg/dL — ABNORMAL HIGH (ref 0.40–1.20)
GFR: 41.66 mL/min — ABNORMAL LOW (ref >60.00)
Glucose, Bld: 109 mg/dL — ABNORMAL HIGH (ref 70–99)
Potassium: 4 mEq/L (ref 3.5–5.1)
Sodium: 140 mEq/L (ref 135–145)
Total Bilirubin: 0.6 mg/dL (ref 0.2–1.2)
Total Protein: 7.4 g/dL (ref 6.0–8.3)

## 2022-04-18 LAB — URINALYSIS, ROUTINE W REFLEX MICROSCOPIC
Bilirubin Urine: NEGATIVE
Ketones, ur: NEGATIVE
Leukocytes,Ua: NEGATIVE
Nitrite: NEGATIVE
Specific Gravity, Urine: 1.025 (ref 1.000–1.030)
Total Protein, Urine: 100 — AB
Urine Glucose: NEGATIVE
Urobilinogen, UA: 0.2 (ref 0.0–1.0)
pH: 5.5 (ref 5.0–8.0)

## 2022-04-18 LAB — HEMOGLOBIN A1C: Hgb A1c MFr Bld: 6.2 % (ref 4.6–6.5)

## 2022-04-18 LAB — VITAMIN D 25 HYDROXY (VIT D DEFICIENCY, FRACTURES): VITD: 27.01 ng/mL — ABNORMAL LOW (ref 30.00–100.00)

## 2022-04-18 LAB — URIC ACID: Uric Acid, Serum: 6.2 mg/dL (ref 2.4–7.0)

## 2022-04-18 MED ORDER — VITAMIN D (ERGOCALCIFEROL) 1.25 MG (50000 UNIT) PO CAPS: 50000.0000 [IU] | ORAL_CAPSULE | ORAL | 3 refills | Status: DC

## 2022-04-18 MED ORDER — ALLOPURINOL 100 MG PO TABS: 100.0000 mg | ORAL_TABLET | Freq: Every day | ORAL | 3 refills | Status: DC

## 2022-04-18 NOTE — Progress Notes (Signed)
Subjective:  Patient ID: Kristen Castillo, female    DOB: 07/10/1940  Age: 82 y.o. 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 180 tablet 3   benzonatate (TESSALON) 200 MG capsule TAKE ONE CAPSULE BY MOUTH THREE TIMES DAILY AS NEEDED FOR COUGH (Patient taking differently: Take 200 mg by mouth daily. TAKE ONE CAPSULE BY MOUTH THREE TIMES DAILY AS NEEDED FOR COUGH) 90 capsule 3   Bromfenac Sodium (PROLENSA) 0.07 % SOLN Place 1 drop into the left eye daily.     budesonide (RHINOCORT AQUA) 32 MCG/ACT nasal spray Place 1 spray into both nostrils in the morning and at bedtime. (Patient taking differently: Place 1 spray into both nostrils in the morning and at bedtime. Left nostril) 8 mL 5   calcium carbonate (OS-CAL) 600 MG TABS tablet Take 600 mg by mouth daily.      Cholecalciferol (VITAMIN D-1000 MAX ST) 25 MCG (1000 UT) tablet Take 1,000 Units by mouth daily.     denosumab (PROLIA) 60 MG/ML SOLN injection Inject 60 mg into the skin every 6 (six) months. Administer in upper arm, thigh, or abdomen     Difluprednate 0.05 % EMUL Place 1 drop into the left eye 2 (two) times daily.     famotidine (PEPCID) 20 MG tablet Take 20 mg by mouth at bedtime.      LORazepam (ATIVAN) 0.5 MG tablet TAKE ONE TABLET ONCE DAILY AS NEEDED FOR ANXIETY 90 tablet 1   losartan (COZAAR) 25 MG tablet Take 25 mg by mouth daily.     Multiple Vitamin (MULTIVITAMIN) tablet Take 1 tablet by mouth every morning.     nystatin-triamcinolone ointment (MYCOLOG) Apply 1 Application topically 2 (two) times daily.     tamsulosin (FLOMAX) 0.4 MG  CAPS capsule Take 0.4 mg by mouth daily.     allopurinol (ZYLOPRIM) 100 MG tablet Take 1 tablet (100 mg total) by mouth 2 (two) times daily. 180 tablet 3   cephALEXin (KEFLEX) 500 MG capsule Take 500 mg by mouth 2 (two) times daily.     No facility-administered medications prior to visit.    ROS: Review of Systems  Constitutional:  Negative for activity change, appetite change, chills, fatigue and unexpected weight change.  HENT:  Negative for congestion, mouth sores and sinus pressure.   Eyes:  Negative for visual disturbance.  Respiratory:  Negative for cough and chest tightness.   Gastrointestinal:  Negative for abdominal pain and nausea.  Genitourinary:  Negative for difficulty urinating, frequency and vaginal pain.  Musculoskeletal:  Positive for arthralgias. Negative for back pain and gait problem.  Skin:  Negative for pallor and rash.  Neurological:  Negative for dizziness, tremors, weakness, numbness and headaches.  Psychiatric/Behavioral:  Negative for confusion and sleep disturbance.     Objective:  BP 98/60 (BP Location: Left Arm, Patient Position: Sitting, Cuff Size: Large)   Pulse 75   Temp (!) 97.5 F (36.4 C) (Oral)   Ht 5' 2"$  (1.575 m)   Wt 137 lb (62.1 kg)   SpO2 96%   BMI 25.06 kg/m  BP Readings from Last 3 Encounters:  04/18/22 98/60  01/23/22 124/82  01/16/22 110/78    Wt Readings from Last 3 Encounters:  04/18/22 137 lb (62.1 kg)  01/23/22 135 lb (61.2 kg)  01/16/22 133 lb (60.3 kg)    Physical Exam Constitutional:      General: She is not in acute distress.    Appearance: Normal appearance. She is well-developed.  HENT:     Head: Normocephalic.     Right Ear: External ear normal.     Left Ear: External ear normal.     Nose: Nose normal.  Eyes:     General:        Right eye: No discharge.        Left eye: No discharge.     Conjunctiva/sclera: Conjunctivae normal.     Pupils: Pupils are equal, round, and reactive to light.  Neck:      Thyroid: No thyromegaly.     Vascular: No JVD.     Trachea: No tracheal deviation.  Cardiovascular:     Rate and Rhythm: Normal rate and regular rhythm.     Heart sounds: Normal heart sounds.  Pulmonary:     Effort: No respiratory distress.     Breath sounds: No stridor. No wheezing.  Abdominal:     General: Bowel sounds are normal. There is no distension.     Palpations: Abdomen is soft. There is no mass.     Tenderness: There is no abdominal tenderness. There is no guarding or rebound.  Musculoskeletal:        General: No tenderness.     Cervical back: Normal range of motion and neck supple. No rigidity.  Lymphadenopathy:     Cervical: No cervical adenopathy.  Skin:    Findings: No erythema or rash.  Neurological:     Cranial Nerves: No cranial nerve deficit.     Motor: No abnormal muscle tone.     Coordination: Coordination normal.     Deep Tendon Reflexes: Reflexes normal.  Psychiatric:        Behavior: Behavior normal.        Thought Content: Thought content normal.        Judgment: Judgment normal.     Lab Results  Component Value Date   WBC 8.3 04/18/2022   HGB 13.4 04/18/2022   HCT 40.3 04/18/2022   PLT 342.0 04/18/2022   GLUCOSE 109 (H) 04/18/2022   CHOL 255 (H) 01/17/2021   TRIG 231.0 (H) 01/17/2021   HDL 69.10 01/17/2021   LDLDIRECT 135.0 01/17/2021   LDLCALC 117 (H) 11/03/2018   ALT 10 04/18/2022   AST 16 04/18/2022   NA 140 04/18/2022   K 4.0 04/18/2022   CL 105 04/18/2022   CREATININE 1.22 (H) 04/18/2022   BUN 34 (H) 04/18/2022   CO2 24 04/18/2022   TSH 4.99 01/17/2021   INR 1.3 (H) 05/11/2019   HGBA1C 6.2 04/18/2022    MM 3D SCREEN BREAST BILATERAL  Result Date: 05/30/2021 CLINICAL DATA:  Screening. EXAM: DIGITAL SCREENING BILATERAL MAMMOGRAM WITH TOMOSYNTHESIS AND CAD TECHNIQUE: Bilateral screening digital craniocaudal and mediolateral oblique mammograms were obtained. Bilateral screening digital breast tomosynthesis was performed. The images  were evaluated with computer-aided detection. COMPARISON:  Previous exam(s). ACR Breast Density Category b: There are scattered areas of fibroglandular density. FINDINGS: There are no findings suspicious for malignancy. IMPRESSION: No mammographic evidence of malignancy. A result letter of this screening mammogram will be mailed directly to the patient. RECOMMENDATION: Screening mammogram  in one year. (Code:SM-B-01Y) BI-RADS CATEGORY  1: Negative. Electronically Signed   By: Lillia Mountain M.D.   On: 05/30/2021 13:23    Assessment & Plan:   Problem List Items Addressed This Visit       Cardiovascular and Mediastinum   DVT of leg (deep venous thrombosis) ([place])    Continue with Eliquis      Relevant Medications   losartan (COZAAR) 25 MG tablet     Digestive   Pancreas cyst    MRI is pending      GERD (gastroesophageal reflux disease)    Continue on Pepcid        Genitourinary   Nephrolithiasis    Will check urinalysis.  Reduce allopurinol to 50 mg daily due to CRI      Relevant Medications   allopurinol (ZYLOPRIM) 100 MG tablet   Other Relevant Orders   Comprehensive metabolic panel (Completed)   Uric acid (Completed)   CBC with Differential/Platelet (Completed)   Urinalysis   Urinalysis, Routine w reflex microscopic (Completed)   CRI (chronic renal insufficiency), stage 3 (moderate) (HCC) - Primary    Continue to hydrate well.  Follow-up with nephrology      Relevant Orders   CBC with Differential/Platelet (Completed)   Urinalysis     Other   Vitamin D deficiency   Relevant Orders   VITAMIN D 25 Hydroxy (Vit-D Deficiency, Fractures) (Completed)   Hyperglycemia    Continue to monitor glucose, hemoglobin A1c; cut back on sweets.      Relevant Orders   Comprehensive metabolic panel (Completed)   Hemoglobin A1c (Completed)   Anemia   Relevant Orders   Comprehensive metabolic panel (Completed)   Uric acid (Completed)      Meds ordered this encounter   Medications   DISCONTD: Vitamin D, Ergocalciferol, (DRISDOL) 1.25 MG (50000 UNIT) CAPS capsule    Sig: Take 1 capsule (50,000 Units total) by mouth every 30 (thirty) days.    Dispense:  3 capsule    Refill:  3   allopurinol (ZYLOPRIM) 100 MG tablet    Sig: Take 1 tablet (100 mg total) by mouth daily.    Dispense:  90 tablet    Refill:  3      Follow-up: Return in about 6 months (around 10/17/2022) for a follow-up visit.  Walker Kehr, MD

## 2022-04-19 ENCOUNTER — Other Ambulatory Visit: Payer: Self-pay | Admitting: Internal Medicine

## 2022-04-19 MED ORDER — VITAMIN D (ERGOCALCIFEROL) 1.25 MG (50000 UNIT) PO CAPS: 50000.0000 [IU] | ORAL_CAPSULE | ORAL | 3 refills | Status: DC

## 2022-04-22 DIAGNOSIS — R739 Hyperglycemia, unspecified: Secondary | ICD-10-CM | POA: Insufficient documentation

## 2022-04-22 NOTE — Assessment & Plan Note (Signed)
Continue on Pepcid

## 2022-04-22 NOTE — Assessment & Plan Note (Signed)
Will check urinalysis.  Reduce allopurinol to 50 mg daily due to CRI

## 2022-04-22 NOTE — Assessment & Plan Note (Signed)
Continue to monitor glucose, hemoglobin A1c; cut back on sweets.

## 2022-04-22 NOTE — Assessment & Plan Note (Signed)
Continue with Eliquis. ?

## 2022-04-22 NOTE — Assessment & Plan Note (Signed)
Continue to hydrate well.  Follow-up with nephrology

## 2022-04-22 NOTE — Assessment & Plan Note (Signed)
MRI is pending

## 2022-04-23 DIAGNOSIS — N1831 Chronic kidney disease, stage 3a: Secondary | ICD-10-CM | POA: Diagnosis not present

## 2022-04-23 DIAGNOSIS — L905 Scar conditions and fibrosis of skin: Secondary | ICD-10-CM | POA: Diagnosis not present

## 2022-04-29 DIAGNOSIS — Z85828 Personal history of other malignant neoplasm of skin: Secondary | ICD-10-CM | POA: Diagnosis not present

## 2022-04-29 DIAGNOSIS — D1801 Hemangioma of skin and subcutaneous tissue: Secondary | ICD-10-CM | POA: Diagnosis not present

## 2022-04-29 DIAGNOSIS — L821 Other seborrheic keratosis: Secondary | ICD-10-CM | POA: Diagnosis not present

## 2022-04-29 DIAGNOSIS — L218 Other seborrheic dermatitis: Secondary | ICD-10-CM | POA: Diagnosis not present

## 2022-04-30 ENCOUNTER — Other Ambulatory Visit: Payer: Self-pay | Admitting: Internal Medicine

## 2022-04-30 DIAGNOSIS — Z1231 Encounter for screening mammogram for malignant neoplasm of breast: Secondary | ICD-10-CM

## 2022-05-09 DIAGNOSIS — H35371 Puckering of macula, right eye: Secondary | ICD-10-CM | POA: Diagnosis not present

## 2022-05-09 DIAGNOSIS — H35352 Cystoid macular degeneration, left eye: Secondary | ICD-10-CM | POA: Diagnosis not present

## 2022-05-09 DIAGNOSIS — H348322 Tributary (branch) retinal vein occlusion, left eye, stable: Secondary | ICD-10-CM | POA: Diagnosis not present

## 2022-05-09 DIAGNOSIS — H43811 Vitreous degeneration, right eye: Secondary | ICD-10-CM | POA: Diagnosis not present

## 2022-05-30 DIAGNOSIS — E790 Hyperuricemia without signs of inflammatory arthritis and tophaceous disease: Secondary | ICD-10-CM | POA: Diagnosis not present

## 2022-06-03 DIAGNOSIS — N281 Cyst of kidney, acquired: Secondary | ICD-10-CM | POA: Diagnosis not present

## 2022-06-03 DIAGNOSIS — K8020 Calculus of gallbladder without cholecystitis without obstruction: Secondary | ICD-10-CM | POA: Diagnosis not present

## 2022-06-03 DIAGNOSIS — N202 Calculus of kidney with calculus of ureter: Secondary | ICD-10-CM | POA: Diagnosis not present

## 2022-06-03 DIAGNOSIS — N200 Calculus of kidney: Secondary | ICD-10-CM | POA: Diagnosis not present

## 2022-06-06 DIAGNOSIS — N281 Cyst of kidney, acquired: Secondary | ICD-10-CM | POA: Diagnosis not present

## 2022-06-06 DIAGNOSIS — N202 Calculus of kidney with calculus of ureter: Secondary | ICD-10-CM | POA: Diagnosis not present

## 2022-06-12 ENCOUNTER — Ambulatory Visit
Admission: RE | Admit: 2022-06-12 | Discharge: 2022-06-12 | Disposition: A | Discharge status: Home / Self Care | Payer: Medicare Other | Source: Ambulatory Visit | Attending: Internal Medicine | Admitting: Internal Medicine

## 2022-06-12 DIAGNOSIS — Z1231 Encounter for screening mammogram for malignant neoplasm of breast: Secondary | ICD-10-CM

## 2022-06-24 DIAGNOSIS — K080 Exfoliation of teeth due to systemic causes: Secondary | ICD-10-CM | POA: Diagnosis not present

## 2022-06-26 DIAGNOSIS — H903 Sensorineural hearing loss, bilateral: Secondary | ICD-10-CM | POA: Insufficient documentation

## 2022-06-30 ENCOUNTER — Other Ambulatory Visit: Payer: Self-pay | Admitting: Internal Medicine

## 2022-07-03 ENCOUNTER — Telehealth: Payer: Self-pay

## 2022-07-03 NOTE — Telephone Encounter (Signed)
Called patient to schedule Medicare Annual Wellness Visit (AWV). Left message for patient to call back and schedule Medicare Annual Wellness Visit (AWV).  Last date of AWV: 06/26/21  Please schedule an appointment at any time on Annual Wellness Visit Schedule.   

## 2022-07-03 NOTE — Telephone Encounter (Signed)
Patient called and declined AWV.

## 2022-07-15 ENCOUNTER — Telehealth: Payer: Self-pay

## 2022-07-15 ENCOUNTER — Ambulatory Visit (INDEPENDENT_AMBULATORY_CARE_PROVIDER_SITE_OTHER): Payer: Medicare Other

## 2022-07-15 DIAGNOSIS — M810 Age-related osteoporosis without current pathological fracture: Secondary | ICD-10-CM

## 2022-07-15 MED ORDER — DENOSUMAB 60 MG/ML ~~LOC~~ SOSY
60.0000 mg | PREFILLED_SYRINGE | Freq: Once | SUBCUTANEOUS | Status: AC
Start: 2022-07-15 — End: 2022-07-15
  Administered 2022-07-15: 60 mg via SUBCUTANEOUS

## 2022-07-15 NOTE — Progress Notes (Cosign Needed)
Patient seen in office today and given Prolia left subcutaneously.  Patient tolerated injection okay with no complications.    Medical screening examination/treatment/procedure(s) were performed by non-physician practitioner and as supervising physician I was immediately available for consultation/collaboration.  I agree with above. Jacinta Shoe, MD

## 2022-07-22 ENCOUNTER — Ambulatory Visit: Payer: Medicare Other | Admitting: Internal Medicine

## 2022-07-22 ENCOUNTER — Encounter: Payer: Self-pay | Admitting: Internal Medicine

## 2022-07-23 ENCOUNTER — Other Ambulatory Visit: Payer: Self-pay | Admitting: Internal Medicine

## 2022-07-23 MED ORDER — DIPHENOXYLATE-ATROPINE 2.5-0.025 MG PO TABS: 1.0000 | ORAL_TABLET | Freq: Four times a day (QID) | ORAL | 0 refills | Status: DC | PRN

## 2022-07-29 DIAGNOSIS — H348322 Tributary (branch) retinal vein occlusion, left eye, stable: Secondary | ICD-10-CM | POA: Diagnosis not present

## 2022-07-29 DIAGNOSIS — H35371 Puckering of macula, right eye: Secondary | ICD-10-CM | POA: Diagnosis not present

## 2022-07-29 DIAGNOSIS — H43391 Other vitreous opacities, right eye: Secondary | ICD-10-CM | POA: Diagnosis not present

## 2022-07-29 DIAGNOSIS — H35352 Cystoid macular degeneration, left eye: Secondary | ICD-10-CM | POA: Diagnosis not present

## 2022-07-30 DIAGNOSIS — L814 Other melanin hyperpigmentation: Secondary | ICD-10-CM | POA: Diagnosis not present

## 2022-07-30 DIAGNOSIS — L821 Other seborrheic keratosis: Secondary | ICD-10-CM | POA: Diagnosis not present

## 2022-07-30 DIAGNOSIS — Z85828 Personal history of other malignant neoplasm of skin: Secondary | ICD-10-CM | POA: Diagnosis not present

## 2022-07-30 DIAGNOSIS — D692 Other nonthrombocytopenic purpura: Secondary | ICD-10-CM | POA: Diagnosis not present

## 2022-08-06 DIAGNOSIS — L718 Other rosacea: Secondary | ICD-10-CM | POA: Diagnosis not present

## 2022-08-19 ENCOUNTER — Encounter: Payer: Self-pay | Admitting: Internal Medicine

## 2022-08-19 NOTE — Telephone Encounter (Signed)
Pt has called and stated she was informed to make an apptmnt with her provider due to a fall and rt sided breast area pain. I was able to inform the pt that her provider next avail apptmnt is 08/29/2022. Pt stated that is to long and she is open to see another provider if Dr. Posey Rea is okay with it.  **Please call pt to inform her of PCP advise and to schedule apptmnt upon calling the pt.

## 2022-08-20 ENCOUNTER — Other Ambulatory Visit: Payer: Self-pay | Admitting: Internal Medicine

## 2022-08-20 ENCOUNTER — Encounter: Payer: Self-pay | Admitting: Internal Medicine

## 2022-08-20 DIAGNOSIS — R0789 Other chest pain: Secondary | ICD-10-CM

## 2022-08-21 ENCOUNTER — Ambulatory Visit (INDEPENDENT_AMBULATORY_CARE_PROVIDER_SITE_OTHER): Payer: Medicare Other

## 2022-08-21 DIAGNOSIS — R0789 Other chest pain: Secondary | ICD-10-CM

## 2022-08-21 DIAGNOSIS — R0781 Pleurodynia: Secondary | ICD-10-CM | POA: Diagnosis not present

## 2022-08-22 ENCOUNTER — Ambulatory Visit: Payer: Medicare Other | Admitting: Emergency Medicine

## 2022-08-22 ENCOUNTER — Encounter: Payer: Self-pay | Admitting: Emergency Medicine

## 2022-08-22 VITALS — BP 118/72 | HR 72 | Temp 98.5°F | Ht 62.0 in | Wt 126.0 lb

## 2022-08-22 DIAGNOSIS — Z86711 Personal history of pulmonary embolism: Secondary | ICD-10-CM | POA: Diagnosis not present

## 2022-08-22 NOTE — Assessment & Plan Note (Signed)
Doing quite well, good functional capacity.  She is tolerating Eliquis 2.5 twice daily.  She did have a fall 1 week ago and still has some right sided pain.  There is a bruise on her right breast but no evidence of hematoma.  Plan to continue same regimen.  She will let me know if she has any issues with bleeding, any issues with a change in her respiratory status.

## 2022-08-22 NOTE — Patient Instructions (Signed)
Please continue Eliquis as you have been taking. Call for any issues with breathing, bleeding while on the medication Follow Dr. Delton Coombes in 1 year, sooner if you have problems.

## 2022-08-22 NOTE — Progress Notes (Signed)
   Subjective:    Patient ID: Kristen Castillo, female    DOB: 10/07/1940, 82 y.o.   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  She underwent targeted lysis and had improvement on her echocardiogram.  We have been maintaining her on Eliquis 2.5 mg twice daily.  She also deals with chronic cough in the setting of GERD and allergic rhinitis.  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 Today she reports that she had a fall on her R side 1 week ago, hit her R hand, R side and breast. She has a bruise. No SOB, no cough.   Chest x-ray and dedicated rib films done 08/21/2022 reviewed by me.  There may be a very slight hyperlucency that could be consistent with cracked fifth rib -equivocal, nondisplaced.     Review of Systems As per HPI     Objective:   Physical Exam Vitals:   08/22/22 0833  BP: 118/72  Pulse: 72  Temp: 98.5 F (36.9 C)  TempSrc: Oral  SpO2: 97%  Weight: 126 lb (57.2 kg)  Height: 5\' 2"  (1.575 m)    Gen: Pleasant, well-nourished, in no distress,  normal affect  ENT: No lesions,  mouth clear,  oropharynx clear, no postnasal drip, strong voice  Neck: No JVD, no stridor  Lungs: No use of accessory muscles, clear B, no wheeze or crackles.   Cardiovascular: RRR, heart sounds normal, no murmur or gallops, no edema  Musculoskeletal: She has a resolving small linear bruise on lateral aspect of right breast  Neuro: alert, non focal  Skin: Warm, no lesions or rashes      Assessment & Plan:  History of pulmonary embolus (PE) Doing quite well, good functional capacity.  She is tolerating Eliquis 2.5 twice daily.  She did have a fall 1 week ago and still has some right sided pain.  There is a bruise on her right breast but no evidence of hematoma.  Plan to  continue same regimen.  She will let me know if she has any issues with bleeding, any issues with a change in her respiratory status.  Levy Pupa, MD, PhD 08/22/2022, 8:47 AM [facility] [phone] or if no answer [phone]

## 2022-08-26 ENCOUNTER — Ambulatory Visit (INDEPENDENT_AMBULATORY_CARE_PROVIDER_SITE_OTHER): Payer: Medicare Other | Admitting: Radiology

## 2022-08-26 VITALS — BP 134/72 | Temp 98.4°F

## 2022-08-26 DIAGNOSIS — S2001XA Contusion of right breast, initial encounter: Secondary | ICD-10-CM | POA: Diagnosis not present

## 2022-08-26 DIAGNOSIS — K080 Exfoliation of teeth due to systemic causes: Secondary | ICD-10-CM | POA: Diagnosis not present

## 2022-08-26 NOTE — Progress Notes (Signed)
   Kristen Castillo 05-05-1940 [id number]   History:  82 y.o. female complains of 2 right breast lumps noticed areas 08/23/22. Larey Seat while on vacation 10 days ago in [place] and hit right side/chest area and is bruised. Patient is on Eliquis. Patient had x-rays done last week @ PCP that showed no fracture. Patient's husband saw Dr Posey Rea this morning and was told she may have hematomas. Patient is now concerned the breast lumps/hematomas may move to her lung. Hx of PE  Gynecologic History No LMP recorded. Patient has had a hysterectomy.     Obstetric History OB History  Gravida Para Term Preterm AB Living  1       1 0  SAB IAB Ectopic Multiple Live Births  1            # Outcome Date GA Lbr Len/2nd Weight Sex Delivery Anes PTL Lv  1 SAB              The following portions of the patient's history were reviewed and updated as appropriate: allergies, current medications, past family history, past medical history, past social history, past surgical history, and problem list.  Review of Systems Pertinent items noted in HPI and remainder of comprehensive ROS otherwise negative.   Past medical history, past surgical history, family history and social history were all reviewed and documented in the EPIC chart.   Exam:  Vitals:   08/26/22 1337  BP: 134/72  Temp: 98.4 F (36.9 C)  TempSrc: Oral   There is no height or weight on file to calculate BMI.  Physical Exam Vitals and nursing note reviewed. Exam conducted with a chaperone present.  Constitutional:      Appearance: Normal appearance. She is normal weight.  Pulmonary:     Effort: Pulmonary effort is normal.  Chest:       Comments: Bruising over the later aspect of the breast 5cm in length by 2cm width with 2 superficial nodules present within the bruised area less than 1cm each. Tender to palpation. The rest of the breast is unremarkable     Assessment/Plan:   1. Contusion of right breast, initial  encounter Warm compresses to the area. Continue to monitor, if the nodules have persisted after 3 weeks will schedule mammogram. Pt reassured theses are not the same time of blood clots that would travel to her lungs.   [name] B WHNP-BC 2:11 PM 08/26/2022

## 2022-08-27 ENCOUNTER — Telehealth: Payer: Self-pay | Admitting: Emergency Medicine

## 2022-08-27 NOTE — Telephone Encounter (Signed)
Pt came in to drop off a letter for herself, dropped it in Dr. Delton Coombes mailbox

## 2022-08-30 ENCOUNTER — Encounter: Payer: Self-pay | Admitting: Internal Medicine

## 2022-08-30 ENCOUNTER — Ambulatory Visit (INDEPENDENT_AMBULATORY_CARE_PROVIDER_SITE_OTHER): Payer: Medicare Other | Admitting: Internal Medicine

## 2022-08-30 VITALS — BP 120/70 | HR 56 | Temp 98.3°F | Ht 62.0 in | Wt 124.0 lb

## 2022-08-30 DIAGNOSIS — M810 Age-related osteoporosis without current pathological fracture: Secondary | ICD-10-CM | POA: Diagnosis not present

## 2022-08-30 DIAGNOSIS — S2001XA Contusion of right breast, initial encounter: Secondary | ICD-10-CM | POA: Diagnosis not present

## 2022-08-30 DIAGNOSIS — S2239XA Fracture of one rib, unspecified side, initial encounter for closed fracture: Secondary | ICD-10-CM | POA: Insufficient documentation

## 2022-08-30 DIAGNOSIS — S2231XA Fracture of one rib, right side, initial encounter for closed fracture: Secondary | ICD-10-CM

## 2022-08-30 NOTE — Assessment & Plan Note (Addendum)
Closed fracture of the rib #7 R side No severe pain Tylenol as needed

## 2022-08-30 NOTE — Progress Notes (Signed)
Subjective:  Patient ID: Kristen Castillo, female    DOB: 28-May-1940  Age: 82 y.o. MRN: [id number]  CC: Fall   HPI Kristen Castillo presents for a mechanical fall 2 weeks ago while vacationing in [place].  Kristen Castillo had lots of cloths on her which helped to dampen her fall.  Nevertheless Kristen Castillo hit her right breast and right rib cage.  Kristen Castillo is reporting mild pain.  Her recent x-ray revealed seventh rib fracture on the right  Outpatient Medications Prior to Visit  Medication Sig Dispense Refill   allopurinol (ZYLOPRIM) 100 MG tablet Take 1 tablet (100 mg total) by mouth daily. (Patient taking differently: Take 200 mg by mouth daily.) 90 tablet 3   apixaban (ELIQUIS) 2.5 MG TABS tablet Take 1 tablet (2.5 mg total) by mouth 2 (two) times daily. 180 tablet 3   Bromfenac Sodium (PROLENSA) 0.07 % SOLN Place 1 drop into the left eye daily.     budesonide (RHINOCORT AQUA) 32 MCG/ACT nasal spray Place 1 spray into both nostrils in the morning and at bedtime. (Patient taking differently: Place 1 spray into both nostrils in the morning and at bedtime. Left nostril) 8 mL 5   calcium carbonate (OS-CAL) 600 MG TABS tablet Take 600 mg by mouth daily.      Cholecalciferol (VITAMIN D-1000 MAX ST) 25 MCG (1000 UT) tablet Take 1,000 Units by mouth daily.     denosumab (PROLIA) 60 MG/ML SOLN injection Inject 60 mg into the skin every 6 (six) months. Administer in upper arm, thigh, or abdomen     Difluprednate 0.05 % EMUL Place 1 drop into the left eye 2 (two) times daily.     famotidine (PEPCID) 20 MG tablet Take 20 mg by mouth at bedtime.      LORazepam (ATIVAN) 0.5 MG tablet TAKE ONE TABLET ONCE DAILY AS NEEDED FOR ANXIETY 90 tablet 1   losartan (COZAAR) 25 MG tablet Take 12.5 mg by mouth daily.     Multiple Vitamin (MULTIVITAMIN) tablet Take 1 tablet by mouth every morning.     diphenoxylate-atropine (LOMOTIL) 2.5-0.025 MG tablet Take 1-2 tablets by mouth 4 (four) times daily as needed for diarrhea or loose  stools. (Patient not taking: Reported on 08/26/2022) 60 tablet 0   nystatin-triamcinolone ointment (MYCOLOG) Apply 1 Application topically 2 (two) times daily. (Patient not taking: Reported on 08/26/2022)     No facility-administered medications prior to visit.    ROS: Review of Systems  Constitutional:  Negative for activity change, appetite change, chills, fatigue and unexpected weight change.  HENT:  Negative for congestion, mouth sores and sinus pressure.   Eyes:  Negative for visual disturbance.  Respiratory:  Negative for cough and chest tightness.   Cardiovascular:  Positive for chest pain.  Gastrointestinal:  Negative for abdominal pain and nausea.  Genitourinary:  Negative for difficulty urinating, frequency and vaginal pain.  Musculoskeletal:  Negative for back pain and gait problem.  Skin:  Negative for pallor and rash.  Neurological:  Negative for dizziness, tremors, weakness, numbness and headaches.  Psychiatric/Behavioral:  Negative for confusion and sleep disturbance.     Objective:  BP 120/70 (BP Location: Left Arm, Patient Position: Sitting, Cuff Size: Normal)   Pulse (!) 56   Temp 98.3 F (36.8 C) (Oral)   Ht 5\' 2"  (1.575 m)   Wt 124 lb (56.2 kg)   SpO2 95%   BMI 22.68 kg/m   BP Readings from Last 3 Encounters:  08/30/22 120/70  08/26/22 134/72  08/22/22 118/72    Wt Readings from Last 3 Encounters:  08/30/22 124 lb (56.2 kg)  08/22/22 126 lb (57.2 kg)  04/18/22 137 lb (62.1 kg)    Physical Exam Constitutional:      General: Kristen Castillo is not in acute distress.    Appearance: Normal appearance. Kristen Castillo is well-developed.  HENT:     Head: Normocephalic.     Right Ear: External ear normal.     Left Ear: External ear normal.     Nose: Nose normal.  Eyes:     General:        Right eye: No discharge.        Left eye: No discharge.     Conjunctiva/sclera: Conjunctivae normal.     Pupils: Pupils are equal, round, and reactive to light.  Neck:     Thyroid: No  thyromegaly.     Vascular: No JVD.     Trachea: No tracheal deviation.  Cardiovascular:     Rate and Rhythm: Normal rate and regular rhythm.     Heart sounds: Normal heart sounds.  Pulmonary:     Effort: No respiratory distress.     Breath sounds: No stridor. No wheezing.  Abdominal:     General: Bowel sounds are normal. There is no distension.     Palpations: Abdomen is soft. There is no mass.     Tenderness: There is no abdominal tenderness. There is no guarding or rebound.  Musculoskeletal:        General: No tenderness.     Cervical back: Normal range of motion and neck supple. No rigidity.  Lymphadenopathy:     Cervical: No cervical adenopathy.  Skin:    Findings: No erythema or rash.  Neurological:     Cranial Nerves: No cranial nerve deficit.     Motor: No abnormal muscle tone.     Coordination: Coordination normal.     Deep Tendon Reflexes: Reflexes normal.  Psychiatric:        Behavior: Behavior normal.        Thought Content: Thought content normal.        Judgment: Judgment normal.   Resolving bruise on the right breast Right chest wall is sensitive to palpation  Lab Results  Component Value Date   WBC 8.3 04/18/2022   HGB 13.4 04/18/2022   HCT 40.3 04/18/2022   PLT 342.0 04/18/2022   GLUCOSE 109 (H) 04/18/2022   CHOL 255 (H) 01/17/2021   TRIG 231.0 (H) 01/17/2021   HDL 69.10 01/17/2021   LDLDIRECT 135.0 01/17/2021   LDLCALC 117 (H) 11/03/2018   ALT 10 04/18/2022   AST 16 04/18/2022   NA 140 04/18/2022   K 4.0 04/18/2022   CL 105 04/18/2022   CREATININE 1.22 (H) 04/18/2022   BUN 34 (H) 04/18/2022   CO2 24 04/18/2022   TSH 4.99 01/17/2021   INR 1.3 (H) 05/11/2019   HGBA1C 6.2 04/18/2022    MM 3D SCREEN BREAST BILATERAL  Result Date: 06/13/2022 CLINICAL DATA:  Screening. EXAM: DIGITAL SCREENING BILATERAL MAMMOGRAM WITH TOMOSYNTHESIS AND CAD TECHNIQUE: Bilateral screening digital craniocaudal and mediolateral oblique mammograms were obtained.  Bilateral screening digital breast tomosynthesis was performed. The images were evaluated with computer-aided detection. COMPARISON:  Previous exam(s). ACR Breast Density Category b: There are scattered areas of fibroglandular density. FINDINGS: There are no findings suspicious for malignancy. IMPRESSION: No mammographic evidence of malignancy. A result letter of this screening mammogram will be mailed directly to the patient. RECOMMENDATION: Screening mammogram in  one year. (Code:SM-B-01Y) BI-RADS CATEGORY  1: Negative. Electronically Signed   By: Norva Pavlov M.D.   On: 06/13/2022 07:57    Assessment & Plan:   Problem List Items Addressed This Visit     Osteoporosis    Vit D every 2 weeks We can add Vit K2      Rib fracture - Primary    Closed fracture of the rib #7 R side No severe pain Tylenol as needed       Bruise of breast    Resolving bruise and hematoma Use Arnica, gentle heat Kristen Castillo was just checked by her gynecologist         No orders of the defined types were placed in this encounter.     Follow-up: Return in about 3 months (around 11/30/2022) for a follow-up visit.  Sonda Primes, MD

## 2022-08-30 NOTE — Assessment & Plan Note (Signed)
Vit D every 2 weeks We can add Vit K2

## 2022-09-01 DIAGNOSIS — S2000XA Contusion of breast, unspecified breast, initial encounter: Secondary | ICD-10-CM | POA: Insufficient documentation

## 2022-09-01 NOTE — Assessment & Plan Note (Signed)
Resolving bruise and hematoma Use Arnica, gentle heat She was just checked by her gynecologist

## 2022-09-24 NOTE — Telephone Encounter (Signed)
Sent to Rx team

## 2022-09-26 DIAGNOSIS — D692 Other nonthrombocytopenic purpura: Secondary | ICD-10-CM | POA: Diagnosis not present

## 2022-09-26 DIAGNOSIS — L905 Scar conditions and fibrosis of skin: Secondary | ICD-10-CM | POA: Diagnosis not present

## 2022-09-27 ENCOUNTER — Telehealth: Payer: Self-pay

## 2022-09-27 NOTE — Telephone Encounter (Signed)
Needs CBE visit

## 2022-09-27 NOTE — Telephone Encounter (Signed)
Pt LVM in triage line in regards to recent appt (08/26/2022) stating that she does still feels a small area/lump/bump in right breast.   Desires to know if should have an OV again for another exam or go ahead and schedule imaging? Please advise.

## 2022-09-27 NOTE — Telephone Encounter (Signed)
Pt scheduled for 10/01/2022 @ 330. Will route to [facility] for review and close encounter.

## 2022-10-01 ENCOUNTER — Ambulatory Visit (INDEPENDENT_AMBULATORY_CARE_PROVIDER_SITE_OTHER): Payer: Medicare Other | Admitting: Obstetrics and Gynecology

## 2022-10-01 ENCOUNTER — Telehealth: Payer: Self-pay | Admitting: Obstetrics and Gynecology

## 2022-10-01 ENCOUNTER — Encounter: Payer: Self-pay | Admitting: Obstetrics and Gynecology

## 2022-10-01 VITALS — BP 108/68 | HR 75 | Ht 62.0 in | Wt 124.0 lb

## 2022-10-01 DIAGNOSIS — S2001XA Contusion of right breast, initial encounter: Secondary | ICD-10-CM

## 2022-10-01 DIAGNOSIS — N6315 Unspecified lump in the right breast, overlapping quadrants: Secondary | ICD-10-CM | POA: Diagnosis not present

## 2022-10-01 NOTE — Telephone Encounter (Signed)
Please schedule a [facility] right mamm[facility].   She has a 7 mm mass at the 9:00 position.   She had trauma to the breast from a fall in June.  She takes Eliquis.

## 2022-10-01 NOTE — Progress Notes (Unsigned)
GYNECOLOGY  VISIT   HPI: 82 y.o.   Married  Caucasian  female   [id number] with No LMP recorded. Patient has had a hysterectomy.   here for  breast exam recheck. Right breast reviewed with nurse practitioner. Pt can still feel small lump, no pain. Pt had bruised breast during a fall in June while in [place].    Hx prior breast reduction.   Dx with rib fracture from her recent fall.   Hx DVT/PE following surgeries.  So she is on Eliquis.   GYNECOLOGIC HISTORY: No LMP recorded. Patient has had a hysterectomy. 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   Hyperglycemia 04/22/2022   Basal cell carcinoma of nose 10/23/2021   History of skin cancer 10/23/2021   CRI (chronic renal insufficiency), stage 3 (moderate) (HCC) 10/23/2021   Leg pain 10/23/2021   History of molar pregnancy 09/04/2021   Travel advice encounter 04/23/2021   Atherosclerosis of aorta (HCC) 01/18/2021   Family history of colon cancer 01/17/2021   History of hydatid disease 01/13/2021   Renal cyst 11/21/2020   Pancreas cyst 04/10/2020   Paresthesias 01/10/2020   Hematuria 08/26/2019   CRI (chronic renal insufficiency) 07/29/2019   Fatigue 07/29/2019   Cholelithiasis 07/29/2019   Bloating 07/12/2019   Bilateral impacted cerumen 06/17/2019   Presbycusis of both ears 06/17/2019   Preop exam for internal medicine 05/11/2019   Palpitations 06/25/2018   Foot pain, right 05/28/2018   Elevated BP without diagnosis of hypertension 06/16/2017   Carotid bruit 11/07/2015    Chest pain 08/23/2015   DVT of leg (deep venous thrombosis) (HCC) 08/23/2015   History of pulmonary embolus (PE) 08/23/2015   Ventral hernia s/p laparoscopic repair with mesh 08/01/15 08/01/2015   Elevated WBC count 07/05/2015   Recurrent pneumonia 07/03/2015   Upper respiratory infection 06/27/2015   Hernia of abdominal wall 01/25/2015   Abdominal pain, epigastric 10/04/2014   Stress 10/04/2014   Moderate malnutrition (HCC) 05/24/2014   Anemia 05/24/2014   Abdominal pain    Dehydration    Migraine without aura and without status migrainosus, not intractable    Sigmoid diverticulitis 05/03/2014   Difficulty tolerating colonoscopy bowel prep 04/29/2014   Anxiety, mild 04/29/2014   Stricture of colon determined by endoscopy 2003 04/29/2014   Diverticulitis of colon with perforation 04/28/2014   GERD (gastroesophageal reflux disease) 04/28/2014   Cough 03/21/2014   Well adult exam 03/21/2014   Aqueous misdirection 10/13/2013   Anatomical narrow angle of right eye 10/13/2013   Nuclear sclerotic cataract of right eye 10/13/2013   Pseudophakia, left eye 10/13/2013   Malignant glaucoma of left eye 10/13/2013   Nephrolithiasis 01/08/2011   HEMATURIA, MICROSCOPIC, HX OF 01/28/2008  Vitamin D deficiency 04/01/2007   HAIR LOSS 04/01/2007   TREMOR 04/01/2007   Allergic rhinitis 03/31/2007   OSTEOARTHRITIS 03/31/2007   Osteoporosis 11/12/2006    Past Medical History:  Diagnosis Date   Anxiety    Complication of anesthesia    Congestion of both ears    Deviated nasal septum    Diverticulitis    DVT (deep venous thrombosis) (HCC)    Gallstones    GERD (gastroesophageal reflux disease)    Hair loss    Hematuria    in past   History of creation of ostomy (HCC)    past partial colectomy in 05/03/14   History of diverticulitis    History of kidney stones    History of skin cancer    Kidney cysts    Migraines    in past   Molar pregnancy    Osteoarthritis    fingers     Osteoporosis    Osteoporosis    Persistent cough    Pneumonia 2019   PONV (postoperative nausea and vomiting)    sensitive to medications   Skin cancer    Unspecified vitamin D deficiency    Ventral hernia     Past Surgical History:  Procedure Laterality Date   ABDOMINAL HYSTERECTOMY  1972   CYSTOSCOPY WITH RETROGRADE PYELOGRAM, URETEROSCOPY AND STENT PLACEMENT Left 05/21/2019   Procedure: CYSTOSCOPY WITH RETROGRADE PYELOGRAM, URETEROSCOPY AND STENT PLACEMENT;  Surgeon: Sebastian Ache, MD;  Location: [facility];  Service: Urology;  Laterality: Left;  1 HR   CYSTOSCOPY WITH RETROGRADE PYELOGRAM, URETEROSCOPY AND STENT PLACEMENT Left 05/31/2020   Procedure: CYSTOSCOPY WITH RETROGRADE PYELOGRAM, URETEROSCOPY, BASKET EXTRACTION AND STENT PLACEMENT;  Surgeon: Sebastian Ache, MD;  Location: [facility];  Service: Urology;  Laterality: Left;  1 HR   CYSTOSCOPY/URETEROSCOPY/HOLMIUM LASER/STENT PLACEMENT Left 02/26/2019   Procedure: CYSTOSCOPY/URETEROSCOPY/HOLMIUM LASER/STENT PLACEMENT FIRST STAGE;  Surgeon: Sebastian Ache, MD;  Location: [facility];  Service: Urology;  Laterality: Left;  1 HR   EYE SURGERY     FACIAL COSMETIC SURGERY  2009   Soyla Dryer MD in [facility]   HOLMIUM LASER APPLICATION Left 05/21/2019   Procedure: HOLMIUM LASER APPLICATION;  Surgeon: Sebastian Ache, MD;  Location: [facility];  Service: Urology;  Laterality: Left;   HOLMIUM LASER APPLICATION Left 05/31/2020   Procedure: HOLMIUM LASER APPLICATION;  Surgeon: Sebastian Ache, MD;  Location: [facility];  Service: Urology;  Laterality: Left;   ILEO LOOP COLOSTOMY CLOSURE N/A 11/08/2014   Procedure: LAPAROSCOPIC COLOSTOMY CLOSURE WITH PARTIAL COLECTOMY;  Surgeon: Avel Peace, MD;  Location: [facility];  Service: General;  Laterality: N/A;   INSERTION OF MESH N/A 08/01/2015   Procedure: INSERTION OF MESH;  Surgeon: Avel Peace, MD;  Location: [facility];  Service: General;  Laterality: N/A;   LAPAROSCOPIC LYSIS OF ADHESIONS N/A 11/08/2014   Procedure:  LAPAROSCOPIC LYSIS OF ADHESIONS FOR 90 MINUTES;  Surgeon: Avel Peace, MD;  Location: [facility];  Service: General;  Laterality: N/A;   LAPAROSCOPIC LYSIS OF ADHESIONS N/A 08/01/2015   Procedure: LAPAROSCOPIC LYSIS OF ADHESIONS;  Surgeon: Avel Peace, MD;  Location: [facility];  Service: General;  Laterality: N/A;   LAPAROSCOPIC PARTIAL COLECTOMY N/A 05/03/2014   Procedure: DIAGNOSTIC LAPAROSCOPY WITH DRAINAGE OF INTRA ABDOMINAL ABSCESS PARTIAL COLECTOMY ;  Surgeon: Avel Peace, MD;  Location: [facility];  Service: General;  Laterality: N/A;  supine position   LAPAROTOMY N/A 05/03/2014   Procedure: EXPLORATORY LAPAROTOMY WITH HARTMAN PROCEDURE;  Surgeon: Avel Peace, MD;  Location: [facility];  Service:  General;  Laterality: N/A;   LIPOMA RESECTION     left scapula area   OOPHORECTOMY  1976   REDUCTION MAMMAPLASTY  1994   RHINOPLASTY  1985   TONSILLECTOMY     VENTRAL HERNIA REPAIR N/A 08/01/2015   Procedure: LAPAROSCOPIC VENTRAL INCISIONAL  HERNIA;  Surgeon: Avel Peace, MD;  Location: [facility];  Service: General;  Laterality: N/A;    Current Outpatient Medications  Medication Sig Dispense Refill   allopurinol (ZYLOPRIM) 100 MG tablet Take 1 tablet (100 mg total) by mouth daily. (Patient taking differently: Take 200 mg by mouth daily.) 90 tablet 3   apixaban (ELIQUIS) 2.5 MG TABS tablet Take 1 tablet (2.5 mg total) by mouth 2 (two) times daily. 180 tablet 3   Bromfenac Sodium (PROLENSA) 0.07 % SOLN Place 1 drop into the left eye daily.     budesonide (RHINOCORT AQUA) 32 MCG/ACT nasal spray Place 1 spray into both nostrils in the morning and at bedtime. (Patient taking differently: Place 1 spray into both nostrils in the morning and at bedtime. Left nostril) 8 mL 5   calcium carbonate (OS-CAL) 600 MG TABS tablet Take 600 mg by mouth daily.      Cholecalciferol (VITAMIN D-1000 MAX ST) 25 MCG (1000 UT) tablet Take 1,000 Units by mouth daily.     denosumab (PROLIA) 60 MG/ML SOLN injection Inject  60 mg into the skin every 6 (six) months. Administer in upper arm, thigh, or abdomen     Difluprednate 0.05 % EMUL Place 1 drop into the left eye 2 (two) times daily.     diphenoxylate-atropine (LOMOTIL) 2.5-0.025 MG tablet Take 1-2 tablets by mouth 4 (four) times daily as needed for diarrhea or loose stools. 60 tablet 0   famotidine (PEPCID) 20 MG tablet Take 20 mg by mouth at bedtime.      LORazepam (ATIVAN) 0.5 MG tablet TAKE ONE TABLET ONCE DAILY AS NEEDED FOR ANXIETY 90 tablet 1   losartan (COZAAR) 25 MG tablet Take 12.5 mg by mouth daily.     Multiple Vitamin (MULTIVITAMIN) tablet Take 1 tablet by mouth every morning.     nystatin-triamcinolone ointment (MYCOLOG) Apply 1 Application topically 2 (two) times daily.     No current facility-administered medications for this visit.     ALLERGIES: 3,5-diiodothyronine; Codeine; Dilaudid [hydromorphone hcl]; Latrix xm [urea in zn undecyl-lactic acid]; Pedi-pre tape spray [wound dressing adhesive]; Sulfa antibiotics; Sulfacetamide; Sulfonamide derivatives; Tape; Hydrocodone-acetaminophen; and Norco [hydrocodone-acetaminophen]  Family History  Problem Relation Age of Onset   Arthritis Mother    Colon cancer Mother 59   Heart disease Father    Dementia Father    Diabetes Sister    Cancer Maternal Grandfather        Lung cancer   Heart disease Maternal Grandmother    Stomach cancer Neg Hx     Social History   Socioeconomic History   Marital status: Married    Spouse name: Not on file   Number of children: Not on file   Years of education: Not on file   Highest education level: Not on file  Occupational History   Not on file  Tobacco Use   Smoking status: Never   Smokeless tobacco: Never  Vaping Use   Vaping status: Never Used  Substance and Sexual Activity   Alcohol use: No    Alcohol/week: 0.0 standard drinks of alcohol   Drug use: No   Sexual activity: Not Currently    Comment: 1st intercourse 21 yo-5 partners  Other  Topics Concern   Not on file  Social History Narrative   [employer]- education   Married '64   Work: Runner, broadcasting/film/video, 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[place], retired   2 adopted sons '69, '71   Marriage in good health- Life is good   Social Determinants of Corporate investment banker Strain: Low Risk  (06/26/2021)   Overall Financial Resource Strain (CARDIA)    Difficulty of Paying Living Expenses: Not hard at all  Food Insecurity: No Food Insecurity (06/26/2021)   Hunger Vital Sign    Worried About Running Out of Food in the Last Year: Never true    Ran Out of Food in the Last Year: Never true  Transportation Needs: No Transportation Needs (06/26/2021)   PRAPARE - Administrator, Civil Service (Medical): No    Lack of Transportation (Non-Medical): No  Physical Activity: Sufficiently Active (06/26/2021)   Exercise Vital Sign    Days of Exercise per Week: 7 days    Minutes of Exercise per Session: 30 min  Stress: No Stress Concern Present (06/26/2021)   [employer] of Occupational Health - Occupational Stress Questionnaire    Feeling of Stress : Not at all  Social Connections: Socially Integrated (06/26/2021)   Social Connection and Isolation Panel [NHANES]    Frequency of Communication with Friends and Family: More than three times a week    Frequency of Social Gatherings with Friends and Family: More than three times a week    Attends Religious Services: More than 4 times per year    Active Member of [employer] or Organizations: Yes    Attends Banker Meetings: More than 4 times per year    Marital Status: Married  Catering manager Violence: Not At Risk (06/26/2021)   Humiliation, Afraid, Rape, and Kick questionnaire    Fear of Current or Ex-Partner: No    Emotionally Abused: No    Physically Abused: No    Sexually Abused: No    Review of Systems  All other systems reviewed and are negative.   PHYSICAL EXAMINATION:    BP 108/68 (BP  Location: Left Arm, Patient Position: Sitting, Cuff Size: Normal)   Pulse 75   Ht 5\' 2"  (1.575 m)   Wt 124 lb (56.2 kg)   SpO2 97%   BMI 22.68 kg/m     General appearance: alert, cooperative and appears stated age  Breasts: left - scars consistent with breast reduction, no masses or tenderness, No nipple retraction or dimpling, No nipple discharge or bleeding, No axillary or supraclavicular adenopathy Right - scars consistent with breast reduction, 7 mm mass at 9:00 position, No nipple retraction or dimpling, No nipple discharge or bleeding, No axillary or supraclavicular adenopathy  Chaperone was present for exam:  Warren Lacy, CMA  ASSESSMENT  7 mm mass 9:00 right breast mass. Status post fall.  Hx breast reduction.  On Eliquis.   PLAN  We discussed hematoma, fatty necrosis, or other etiology for a breast mass. Dx right mammogram and right breast US at [facility].

## 2022-10-02 NOTE — Telephone Encounter (Signed)
Spoke w/ Kristen Castillo @ [facility] and got pt scheduled for 7/31 @ 150pm. Pt notified and voiced understanding & appreciation for quick appt. Will route to provider for review and close.

## 2022-10-07 DIAGNOSIS — I129 Hypertensive chronic kidney disease with stage 1 through stage 4 chronic kidney disease, or unspecified chronic kidney disease: Secondary | ICD-10-CM | POA: Diagnosis not present

## 2022-10-07 DIAGNOSIS — N1832 Chronic kidney disease, stage 3b: Secondary | ICD-10-CM | POA: Diagnosis not present

## 2022-10-07 DIAGNOSIS — R809 Proteinuria, unspecified: Secondary | ICD-10-CM | POA: Diagnosis not present

## 2022-10-07 DIAGNOSIS — R7303 Prediabetes: Secondary | ICD-10-CM | POA: Diagnosis not present

## 2022-10-09 ENCOUNTER — Ambulatory Visit
Admission: RE | Admit: 2022-10-09 | Discharge: 2022-10-09 | Disposition: A | Discharge status: Home / Self Care | Payer: Medicare Other | Source: Ambulatory Visit | Attending: Obstetrics and Gynecology | Admitting: Obstetrics and Gynecology

## 2022-10-09 ENCOUNTER — Encounter (INDEPENDENT_AMBULATORY_CARE_PROVIDER_SITE_OTHER): Payer: Self-pay

## 2022-10-09 DIAGNOSIS — N644 Mastodynia: Secondary | ICD-10-CM | POA: Diagnosis not present

## 2022-10-09 DIAGNOSIS — S2001XA Contusion of right breast, initial encounter: Secondary | ICD-10-CM

## 2022-10-09 DIAGNOSIS — N6315 Unspecified lump in the right breast, overlapping quadrants: Secondary | ICD-10-CM

## 2022-10-09 DIAGNOSIS — N6011 Diffuse cystic mastopathy of right breast: Secondary | ICD-10-CM | POA: Diagnosis not present

## 2022-10-15 ENCOUNTER — Telehealth: Payer: Self-pay

## 2022-10-15 ENCOUNTER — Ambulatory Visit (INDEPENDENT_AMBULATORY_CARE_PROVIDER_SITE_OTHER): Payer: Medicare Other

## 2022-10-15 VITALS — Ht 62.0 in | Wt 122.0 lb

## 2022-10-15 DIAGNOSIS — Z0000 Encounter for general adult medical examination without abnormal findings: Secondary | ICD-10-CM

## 2022-10-15 DIAGNOSIS — Z780 Asymptomatic menopausal state: Secondary | ICD-10-CM

## 2022-10-15 NOTE — Patient Instructions (Addendum)
Kristen Castillo , Thank you for taking time to come for your Medicare Wellness Visit. I appreciate your ongoing commitment to your health goals. Please review the following plan we discussed and let me know if I can assist you in the future.   Referrals/Orders/Follow-Ups/Clinician Recommendations: A message has been sent to your PCP in regards to a colonoscopy and bone density screening.  You can discuss this during your next visit with Dr. Posey Rea.  Also remember you are due for your Flu vaccine and Covid vaccine before this year is out.   This is a list of the screening recommended for you and due dates:  Health Maintenance  Topic Date Due   COVID-19 Vaccine (6 - 2023-24 season) 02/20/2022   Flu Shot  10/10/2022   Medicare Annual Wellness Visit  10/15/2023   DTaP/Tdap/Td vaccine (3 - Td or Tdap) 08/25/2029   Pneumonia Vaccine  Completed   DEXA scan (bone density measurement)  Completed   Zoster (Shingles) Vaccine  Completed   HPV Vaccine  Aged Out    Advanced directives: (Copy Requested) Please bring a copy of your health care power of attorney and living will to the office to be added to your chart at your convenience.  Next Medicare Annual Wellness Visit scheduled for next year: No  Preventive Care 26 Years and Older, Female Preventive care refers to lifestyle choices and visits with your health care provider that can promote health and wellness. What does preventive care include? A yearly physical exam. This is also called an annual well check. Dental exams once or twice a year. Routine eye exams. Ask your health care provider how often you should have your eyes checked. Personal lifestyle choices, including: Daily care of your teeth and gums. Regular physical activity. Eating a healthy diet. Avoiding tobacco and drug use. Limiting alcohol use. Practicing safe sex. Taking low-dose aspirin every day. Taking vitamin and mineral supplements as recommended by your health care  provider. What happens during an annual well check? The services and screenings done by your health care provider during your annual well check will depend on your age, overall health, lifestyle risk factors, and family history of disease. Counseling  Your health care provider may ask you questions about your: Alcohol use. Tobacco use. Drug use. Emotional well-being. Home and relationship well-being. Sexual activity. Eating habits. History of falls. Memory and ability to understand (cognition). Work and work Astronomer. Reproductive health. Screening  You may have the following tests or measurements: Height, weight, and BMI. Blood pressure. Lipid and cholesterol levels. These may be checked every 5 years, or more frequently if you are over 66 years old. Skin check. Lung cancer screening. You may have this screening every year starting at age 47 if you have a 30-pack-year history of smoking and currently smoke or have quit within the past 15 years. Fecal occult blood test (FOBT) of the stool. You may have this test every year starting at age 10. Flexible sigmoidoscopy or colonoscopy. You may have a sigmoidoscopy every 5 years or a colonoscopy every 10 years starting at age 65. Hepatitis C blood test. Hepatitis B blood test. Sexually transmitted disease (STD) testing. Diabetes screening. This is done by checking your blood sugar (glucose) after you have not eaten for a while (fasting). You may have this done every 1-3 years. Bone density scan. This is done to screen for osteoporosis. You may have this done starting at age 67. Mammogram. This may be done every 1-2 years. Talk to your  health care provider about how often you should have regular mammograms. Talk with your health care provider about your test results, treatment options, and if necessary, the need for more tests. Vaccines  Your health care provider may recommend certain vaccines, such as: Influenza vaccine. This is  recommended every year. Tetanus, diphtheria, and acellular pertussis (Tdap, Td) vaccine. You may need a Td booster every 10 years. Zoster vaccine. You may need this after age 87. Pneumococcal 13-valent conjugate (PCV13) vaccine. One dose is recommended after age 53. Pneumococcal polysaccharide (PPSV23) vaccine. One dose is recommended after age 25. Talk to your health care provider about which screenings and vaccines you need and how often you need them. This information is not intended to replace advice given to you by your health care provider. Make sure you discuss any questions you have with your health care provider. Document Released: 03/24/2015 Document Revised: 11/15/2015 Document Reviewed: 12/27/2014 Elsevier Interactive Patient Education  2017 [employer].  Fall Prevention in the Home Falls can cause injuries. They can happen to people of all ages. There are many things you can do to make your home safe and to help prevent falls. What can I do on the outside of my home? Regularly fix the edges of walkways and driveways and fix any cracks. Remove anything that might make you trip as you walk through a door, such as a raised step or threshold. Trim any bushes or trees on the path to your home. Use bright outdoor lighting. Clear any walking paths of anything that might make someone trip, such as rocks or tools. Regularly check to see if handrails are loose or broken. Make sure that both sides of any steps have handrails. Any raised decks and porches should have guardrails on the edges. Have any leaves, snow, or ice cleared regularly. Use sand or salt on walking paths during winter. Clean up any spills in your garage right away. This includes oil or grease spills. What can I do in the bathroom? Use night lights. Install grab bars by the toilet and in the tub and shower. Do not use towel bars as grab bars. Use non-skid mats or decals in the tub or shower. If you need to sit down in  the shower, use a plastic, non-slip stool. Keep the floor dry. Clean up any water that spills on the floor as soon as it happens. Remove soap buildup in the tub or shower regularly. Attach bath mats securely with double-sided non-slip rug tape. Do not have throw rugs and other things on the floor that can make you trip. What can I do in the bedroom? Use night lights. Make sure that you have a light by your bed that is easy to reach. Do not use any sheets or blankets that are too big for your bed. They should not hang down onto the floor. Have a firm chair that has side arms. You can use this for support while you get dressed. Do not have throw rugs and other things on the floor that can make you trip. What can I do in the kitchen? Clean up any spills right away. Avoid walking on wet floors. Keep items that you use a lot in easy-to-reach places. If you need to reach something above you, use a strong step stool that has a grab bar. Keep electrical cords out of the way. Do not use floor polish or wax that makes floors slippery. If you must use wax, use non-skid floor wax. Do not have  throw rugs and other things on the floor that can make you trip. What can I do with my stairs? Do not leave any items on the stairs. Make sure that there are handrails on both sides of the stairs and use them. Fix handrails that are broken or loose. Make sure that handrails are as long as the stairways. Check any carpeting to make sure that it is firmly attached to the stairs. Fix any carpet that is loose or worn. Avoid having throw rugs at the top or bottom of the stairs. If you do have throw rugs, attach them to the floor with carpet tape. Make sure that you have a light switch at the top of the stairs and the bottom of the stairs. If you do not have them, ask someone to add them for you. What else can I do to help prevent falls? Wear shoes that: Do not have high heels. Have rubber bottoms. Are comfortable  and fit you well. Are closed at the toe. Do not wear sandals. If you use a stepladder: Make sure that it is fully opened. Do not climb a closed stepladder. Make sure that both sides of the stepladder are locked into place. Ask someone to hold it for you, if possible. Clearly mark and make sure that you can see: Any grab bars or handrails. First and last steps. Where the edge of each step is. Use tools that help you move around (mobility aids) if they are needed. These include: Canes. Walkers. Scooters. Crutches. Turn on the lights when you go into a dark area. Replace any light bulbs as soon as they burn out. Set up your furniture so you have a clear path. Avoid moving your furniture around. If any of your floors are uneven, fix them. If there are any pets around you, be aware of where they are. Review your medicines with your doctor. Some medicines can make you feel dizzy. This can increase your chance of falling. Ask your doctor what other things that you can do to help prevent falls. This information is not intended to replace advice given to you by your health care provider. Make sure you discuss any questions you have with your health care provider. Document Released: 12/22/2008 Document Revised: 08/03/2015 Document Reviewed: 04/01/2014 Elsevier Interactive Patient Education  2017 [employer].

## 2022-10-15 NOTE — Progress Notes (Addendum)
Subjective:   Kristen Castillo is a 82 y.o. female who presents for Medicare Annual (Subsequent) preventive examination.  Visit Complete: Virtual  I connected with  Kristen Castillo on 10/15/22 by a audio enabled telemedicine application and verified that I am speaking with the correct person using two identifiers.  Patient Location: Home  Provider Location: Home Office  I discussed the limitations of evaluation and management by telemedicine. The patient expressed understanding and agreed to proceed.  Patient Medicare AWV questionnaire was completed by the patient on 10/14/2022; I have confirmed that all information answered by patient is correct and no changes since this date.  Vital Signs: Per patient no change in vitals since last visit.   Review of Systems    Cardiac Risk Factors include: advanced age (>35men, >41 women);Other (see comment), Risk factor comments: Atherosclerosis, Osteoporosis, CRI     Objective:    Today's Vitals   10/15/22 0950  Weight: 122 lb (55.3 kg)  Height: 5\' 2"  (1.575 m)   Body mass index is 22.31 kg/m.     10/15/2022    9:54 AM 06/26/2021   11:35 AM 05/31/2020    3:02 PM 05/30/2020    2:14 PM 05/13/2019    8:08 AM 02/26/2019    7:18 AM 02/25/2019    9:23 AM  Advanced Directives  Does Patient Have a Medical Advance Directive? Yes Yes Yes Yes Yes No No  Type of Estate agent of [place];Living will Living will;Healthcare Power of [employer] Power of [place];Living will Healthcare Power of [place];Living will     Does patient want to make changes to medical advance directive?  No - Patient declined No - Patient declined      Copy of Healthcare Power of Attorney in Chart? No - copy requested No - copy requested No - copy requested No - copy requested     Would patient like information on creating a medical advance directive?      No - Patient declined     Current Medications (verified) Outpatient Encounter  Medications as of 10/15/2022  Medication Sig   allopurinol (ZYLOPRIM) 100 MG tablet Take 1 tablet (100 mg total) by mouth daily. (Patient taking differently: Take 200 mg by mouth daily.)   apixaban (ELIQUIS) 2.5 MG TABS tablet Take 1 tablet (2.5 mg total) by mouth 2 (two) times daily.   Bromfenac Sodium (PROLENSA) 0.07 % SOLN Place 1 drop into the left eye daily.   budesonide (RHINOCORT AQUA) 32 MCG/ACT nasal spray Place 1 spray into both nostrils in the morning and at bedtime. (Patient taking differently: Place 1 spray into both nostrils in the morning and at bedtime. Left nostril)   calcium carbonate (OS-CAL) 600 MG TABS tablet Take 600 mg by mouth daily.    Cholecalciferol (VITAMIN D-1000 MAX ST) 25 MCG (1000 UT) tablet Take 1,000 Units by mouth daily.   denosumab (PROLIA) 60 MG/ML SOLN injection Inject 60 mg into the skin every 6 (six) months. Administer in upper arm, thigh, or abdomen   Difluprednate 0.05 % EMUL Place 1 drop into the left eye 2 (two) times daily.   famotidine (PEPCID) 20 MG tablet Take 20 mg by mouth at bedtime.    LORazepam (ATIVAN) 0.5 MG tablet TAKE ONE TABLET ONCE DAILY AS NEEDED FOR ANXIETY   losartan (COZAAR) 25 MG tablet Take 12.5 mg by mouth daily.   Multiple Vitamin (MULTIVITAMIN) tablet Take 1 tablet by mouth every morning.   diphenoxylate-atropine (LOMOTIL) 2.5-0.025 MG tablet  Take 1-2 tablets by mouth 4 (four) times daily as needed for diarrhea or loose stools.   nystatin-triamcinolone ointment (MYCOLOG) Apply 1 Application topically 2 (two) times daily.   No facility-administered encounter medications on file as of 10/15/2022.    Allergies (verified) 3,5-diiodothyronine; Codeine; Dilaudid [hydromorphone hcl]; Latrix xm [urea in zn undecyl-lactic acid]; Pedi-pre tape spray [wound dressing adhesive]; Sulfa antibiotics; Sulfacetamide; Sulfonamide derivatives; Tape; Hydrocodone-acetaminophen; and Norco [hydrocodone-acetaminophen]   History: Past Medical History:   Diagnosis Date   Anxiety    Complication of anesthesia    Congestion of both ears    Deviated nasal septum    Diverticulitis    DVT (deep venous thrombosis) (HCC)    Gallstones    GERD (gastroesophageal reflux disease)    Hair loss    Hematuria    in past   History of creation of ostomy (HCC)    past partial colectomy in 05/03/14   History of diverticulitis    History of kidney stones    History of skin cancer    Kidney cysts    Migraines    in past   Molar pregnancy    Osteoarthritis    fingers    Osteoporosis    Osteoporosis    Persistent cough    Pneumonia 2019   PONV (postoperative nausea and vomiting)    sensitive to medications   Skin cancer    Unspecified vitamin D deficiency    Ventral hernia    Past Surgical History:  Procedure Laterality Date   ABDOMINAL HYSTERECTOMY  1972   CYSTOSCOPY WITH RETROGRADE PYELOGRAM, URETEROSCOPY AND STENT PLACEMENT Left 05/21/2019   Procedure: CYSTOSCOPY WITH RETROGRADE PYELOGRAM, URETEROSCOPY AND STENT PLACEMENT;  Surgeon: Sebastian Ache, MD;  Location: [facility];  Service: Urology;  Laterality: Left;  1 HR   CYSTOSCOPY WITH RETROGRADE PYELOGRAM, URETEROSCOPY AND STENT PLACEMENT Left 05/31/2020   Procedure: CYSTOSCOPY WITH RETROGRADE PYELOGRAM, URETEROSCOPY, BASKET EXTRACTION AND STENT PLACEMENT;  Surgeon: Sebastian Ache, MD;  Location: [facility];  Service: Urology;  Laterality: Left;  1 HR   CYSTOSCOPY/URETEROSCOPY/HOLMIUM LASER/STENT PLACEMENT Left 02/26/2019   Procedure: CYSTOSCOPY/URETEROSCOPY/HOLMIUM LASER/STENT PLACEMENT FIRST STAGE;  Surgeon: Sebastian Ache, MD;  [facility]: [facility];  Service: Urology;  Laterality: Left;  1 HR   EYE SURGERY     FACIAL COSMETIC SURGERY  2009   S[facility]la Dryer [facility] in [facility]   HOLMIUM LASER APPLICATION Left 05/21/2019   Procedure: HOLMIUM LASER APPLICATION;  Surgeon: Sebastian Ache, MD;  [facility];  Service: Urology;  Laterality: Left;   HOLMIUM LASER APPLICATION Left 05/31/2020   Procedure:  HOLMIUM LASER APPLICATION;  Surgeon: Sebastian Ache, MD;  [facility];  Service: Urology;  Laterality: Left;   ILEO LOOP COLOSTOMY CLOSURE N/A 11/08/2014   Procedure: LAPAROSCOPIC COLOSTOMY CLOSURE WITH PARTIAL COLECTOMY;  Surgeon: Avel Peace, MD;  [facility];  Service: General;  Laterality: N/A;   INSERTION OF MESH N/A 08/01/2015   Procedure: INSERTION OF MESH;  Surgeon: Avel Peace, MD;  [facility];  Service: General;  Laterality: N/A;   LAPAROSCOPIC LYSIS OF ADHESIONS N/A 11/08/2014   Procedure: LAPAROSCOPIC LYSIS OF ADHESIONS FOR 90 MINUTES;  Surgeon: Avel Peace, MD;  [facility];  Service: General;  Laterality: N/A;   LAPAROSCOPIC LYSIS OF ADHESIONS N/A 08/01/2015   Procedure: LAPAROSCOPIC LYSIS OF ADHESIONS;  Surgeon: Avel Peace, MD;  [facility];  Service: General;  Laterality: N/A;   LAPAROSCOPIC PARTIAL COLECTOMY N/A 05/03/2014   Procedure: DIAGNOSTIC LAPAROSCOPY WITH DRAINAGE OF INTRA ABDOMINAL  ABSCESS PARTIAL COLECTOMY ;  Surgeon: Avel Peace, MD;  [facility];  Service: General;  Laterality: N/A;  supine position   LAPAROTOMY N/A 05/03/2014   Procedure: EXPLORATORY LAPAROTOMY WITH HARTMAN PROCEDURE;  Surgeon: Avel Peace, MD;  [facility];  Service: General;  Laterality: N/A;   LIPOMA RESECTION     left scapula area   OOPHORECTOMY  1976   REDUCTION MAMMAPLASTY  1994   RHINOPLASTY  1985   TONSILLECTOMY     VENTRAL HERNIA REPAIR N/A 08/01/2015   Procedure: LAPAROSCOPIC VENTRAL INCISIONAL  HERNIA;  Surgeon: Avel Peace, MD;  [facility];  Service: General;  Laterality: N/A;   Family History  Problem Relation Age of Onset   Arthritis Mother    Colon cancer Mother 12   Heart disease Father    Dementia Father    Diabetes Sister    Cancer Maternal Grandfather        Lung cancer   Heart disease Maternal Grandmother    Stomach cancer Neg Hx    Social History   Socioeconomic History   Marital status: Married     Spouse name: Dorene Sorrow   Number of children: 2   Years of education: Not on file   Highest education level: Not on file  Occupational History   Occupation: Retired  Tobacco Use   Smoking status: Never   Smokeless tobacco: Never  Vaping Use   Vaping status: Never Used  Substance and Sexual Activity   Alcohol use: No    Alcohol/week: 0.0 standard drinks of alcohol   Drug use: No   Sexual activity: Not Currently    Comment: 1st intercourse 21 yo-5 partners  Other Topics Concern   Not on file  Social History Narrative   [employer]- education   Married '64   Work: Runner, broadcasting/film/video, 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[place], retired   2 adopted sons '69, '71   Marriage in good health- Life is good   Social Determinants of Corporate investment banker Strain: Low Risk  (10/15/2022)   Overall Financial Resource Strain (CARDIA)    Difficulty of Paying Living Expenses: Not hard at all  Food Insecurity: No Food Insecurity (10/15/2022)   Hunger Vital Sign    Worried About Running Out of Food in the Last Year: Never true    Ran Out of Food in the Last Year: Never true  Transportation Needs: No Transportation Needs (10/14/2022)   PRAPARE - Administrator, Civil Service (Medical): No    Lack of Transportation (Non-Medical): No  Physical Activity: Sufficiently Active (10/14/2022)   Exercise Vital Sign    Days of Exercise per Week: 7 days    Minutes of Exercise per Session: 120 min  Stress: No Stress Concern Present (10/14/2022)   [employer] of Occupational Health - Occupational Stress Questionnaire    Feeling of Stress : Not at all  Social Connections: Unknown (10/15/2022)   Social Connection and Isolation Panel [NHANES]    Frequency of Communication with Friends and Family: More than three times a week    Frequency of Social Gatherings with Friends and Family: Twice a week    Attends Religious Services: Not on Marketing executive or Organizations: Yes    Attends  Banker Meetings: More than 4 times per year    Marital Status: Married    Tobacco Counseling Counseling given: Not Answered   Clinical Intake:  Pre-visit preparation completed: Yes  Pain : No/denies pain     BMI - recorded: 22.31 Nutritional Risks: None Diabetes: No  How often do you need to have someone help you when you read instructions, pamphlets, or other written materials from your doctor or pharmacy?: 2 - Rarely  Interpreter Needed?: No  Information entered by ::  , RMA   Activities of Daily Living    10/14/2022    8:18 PM  In your present state of health, do you have any difficulty performing the following activities:  Hearing? 1  Vision? 0  Difficulty concentrating or making decisions? 0  Walking or climbing stairs? 0  Dressing or bathing? 0  Doing errands, shopping? 0  Preparing Food and eating ? N  Using the Toilet? N  In the past six months, have you accidently leaked urine? N  Do you have problems with loss of bowel control? N  Managing your Medications? N  Managing your Finances? N  Housekeeping or managing your Housekeeping? N    Patient Care Team: Plotnikov, Georgina Quint, MD as PCP - General (Internal Medicine) Altheimer, Casimiro Needle, MD (Endocrinology) Avel Peace, MD as Consulting Physician (General Surgery) Pyrtle, Carie Caddy, [name] Janet Berlin, MD as Consulting Physician (Ophthalmology) Delton Coombes Les Pou, MD as Consulting Physician (Pulmonary Disease) Haze Rushing, PhD as Consulting Physician (Psychology) Berneice Heinrich Delbert Phenix., MD as Consulting Physician (Urology) Theresia Majors, MD (Inactive) as Consulting Physician (Obstetrics and Gynecology) Berna Bue, MD as Consulting Physician (General Surgery) Patton Salles, MD as Consulting Physician (Obstetrics and Gynecology) Lucille Passy, MD as Referring Physician (Ophthalmology)  Indicate any recent  Medical Services you may have received from other than [facility] providers in the past year (date may be approximate).     Assessment:   This is a routine wellness examination for [place].  Hearing/Vision screen Hearing Screening - Comments:: Wear hearing aides   Dietary issues and exercise activities discussed:     Goals Addressed   None   Depression Screen    10/15/2022    9:58 AM 04/18/2022    9:24 AM 01/23/2022    8:12 AM 06/26/2021   11:39 AM 10/17/2020    8:18 AM 05/11/2019    9:39 AM 08/15/2017    2:05 PM  PHQ 2/9 Scores  PHQ - 2 Score 0 0 0 0 0 0 0  PHQ- 9 Score 0    1      Fall Risk    10/14/2022    8:18 PM 04/18/2022    9:24 AM 01/23/2022    8:12 AM 06/26/2021   11:36 AM 10/17/2020    8:17 AM  Fall Risk   Falls in the past year? 1 0 0 0 1  Number falls in past yr: 0 0 0 0 0  Injury with Fall? 1 0 0 0 1  Risk for fall due to :  No Fall Risks No Fall Risks No Fall Risks History of fall(s);Impaired balance/gait  Follow up  Falls evaluation completed Falls evaluation completed Falls evaluation completed     MEDICARE RISK AT HOME:   TIMED UP AND GO:  Was the test performed?  No    Cognitive Function:        10/15/2022    9:55 AM 06/26/2021   11:49 AM  6CIT Screen  What Year? 0 points 0 points  What month? 0 points 0 points  What time? 0 points 0 points  Count back from 20 0 points 0  points  Months in reverse 0 points 0 points  Repeat phrase 0 points 0 points  Total Score 0 points 0 points    Immunizations Immunization History  Administered Date(s) Administered   Fluad Quad(high Dose 65+) 11/03/2018, 12/10/2019, 12/29/2020, 01/04/2022   H1N1 04/16/2008   Influenza Whole 12/25/2009, 12/09/2011   Influenza, High Dose Seasonal PF 12/26/2015, 01/01/2017   Influenza,inj,Quad PF,6+ Mos 11/25/2014, 12/04/2017   Influenza-Unspecified 03/01/2014   Moderna Covid-19 Vaccine Bivalent Booster 47yrs & up 12/08/2020   Moderna SARS-COV2 Booster Vaccination 07/11/2020    Moderna Sars-Covid-2 Vaccination 03/20/2019, 04/17/2019, 12/29/2019   Pfizer Covid-19 Vaccine Bivalent Booster 16yrs & up 12/26/2021   Pneumococcal Conjugate-13 04/14/2013   Pneumococcal Polysaccharide-23 03/11/2002, 12/25/2009, 09/16/2017   Td 02/09/2009   Tdap 08/26/2019   Zoster Recombinant(Shingrix) 05/20/2018, 08/26/2018   Zoster, Live 02/17/2009    TDAP status: Up to date  Flu Vaccine status: Up to date  Pneumococcal vaccine status: Up to date  Covid-19 vaccine status: Completed vaccines  Qualifies for Shingles Vaccine? Yes   Zostavax completed Yes   Shingrix Completed?: Yes  Screening Tests Health Maintenance  Topic Date Due   COVID-19 Vaccine (6 - 2023-24 season) 02/20/2022   INFLUENZA VACCINE  10/10/2022   Medicare Annual Wellness (AWV)  10/15/2023   DTaP/Tdap/Td (3 - Td or Tdap) 08/25/2029   Pneumonia Vaccine 58+ Years old  Completed   DEXA SCAN  Completed   Zoster Vaccines- Shingrix  Completed   HPV VACCINES  Aged Out    Health Maintenance  Health Maintenance Due  Topic Date Due   COVID-19 Vaccine (6 - 2023-24 season) 02/20/2022   INFLUENZA VACCINE  10/10/2022    Colorectal cancer screening: No longer required.   Mammogram status: Completed 10/09/2022. Repeat every year  Bone Density status: Completed 05/03/2020. Results reflect: Bone density results: OSTEOPOROSIS. Repeat every 2 years.  Lung Cancer Screening: (Low Dose CT Chest recommended if Age 46-80 years, 20 pack-year currently smoking OR have quit w/in 15years.) does not qualify.   Lung Cancer Screening Referral: N/A  Additional Screening:  Hepatitis C Screening: does not qualify;   Vision Screening: Recommended annual ophthalmology exams for early detection of glaucoma and other disorders of the eye. Is the patient up to date with their annual eye exam?  Yes  Who is the provider or what is the name of the office in which the patient attends annual eye exams? Dr. Lita Mains If pt is not  established with a provider, would they like to be referred to a provider to establish care? No .   Dental Screening: Recommended annual dental exams for proper oral hygiene    Community Resource Referral / Chronic Care Management: CRR required this visit?  No   CCM required this visit?  No     Plan:     I have personally reviewed and noted the following in the patient's chart:   Medical and social history Use of alcohol, tobacco or illicit drugs  Current medications and supplements including opioid prescriptions. Patient is not currently taking opioid prescriptions. Functional ability and status Nutritional status Physical activity Advanced directives List of other physicians Hospitalizations, surgeries, and ER visits in previous 12 months Vitals Screenings to include cognitive, depression, and falls Referrals and appointments  In addition, I have reviewed and discussed with patient certain preventive protocols, quality metrics, and best practice recommendations. A written personalized care plan for preventive services as well as general preventive health recommendations were provided to patient.       L , CMA   10/15/2022   After Visit Summary: (MyChart) Due to this being a telephonic visit, the after visit summary with patients personalized plan was offered to patient via MyChart   Nurse Notes: Patient is due for a DEXA.  I sent a referral via TE due to patient insurance.  Per protocol, patient has aged out for colonoscopy but Dr. Posey Rea can decide if patient should have one this year due to history.  Also patient is interested in getting yearly Covid vaccine.    Medical screening examination/treatment/procedure(s) were performed by non-physician practitioner and as supervising physician I was immediately available for consultation/collaboration.  I agree with above. Jacinta Shoe, MD

## 2022-10-20 NOTE — Telephone Encounter (Signed)
Okay.  It is done.  Thanks

## 2022-10-21 DIAGNOSIS — H1132 Conjunctival hemorrhage, left eye: Secondary | ICD-10-CM | POA: Diagnosis not present

## 2022-10-24 ENCOUNTER — Ambulatory Visit (INDEPENDENT_AMBULATORY_CARE_PROVIDER_SITE_OTHER): Payer: Medicare Other | Admitting: Internal Medicine

## 2022-10-24 ENCOUNTER — Other Ambulatory Visit: Payer: Self-pay | Admitting: Internal Medicine

## 2022-10-24 ENCOUNTER — Encounter: Payer: Self-pay | Admitting: Internal Medicine

## 2022-10-24 VITALS — BP 110/70 | HR 65 | Temp 97.9°F | Ht 62.0 in | Wt 123.0 lb

## 2022-10-24 DIAGNOSIS — K219 Gastro-esophageal reflux disease without esophagitis: Secondary | ICD-10-CM

## 2022-10-24 DIAGNOSIS — E559 Vitamin D deficiency, unspecified: Secondary | ICD-10-CM

## 2022-10-24 DIAGNOSIS — F419 Anxiety disorder, unspecified: Secondary | ICD-10-CM | POA: Diagnosis not present

## 2022-10-24 DIAGNOSIS — K862 Cyst of pancreas: Secondary | ICD-10-CM | POA: Diagnosis not present

## 2022-10-24 DIAGNOSIS — N183 Chronic kidney disease, stage 3 unspecified: Secondary | ICD-10-CM

## 2022-10-24 DIAGNOSIS — R1900 Intra-abdominal and pelvic swelling, mass and lump, unspecified site: Secondary | ICD-10-CM

## 2022-10-24 DIAGNOSIS — N200 Calculus of kidney: Secondary | ICD-10-CM

## 2022-10-24 MED ORDER — ALLOPURINOL 100 MG PO TABS: 200.0000 mg | ORAL_TABLET | Freq: Every day | ORAL | 1 refills | Status: AC

## 2022-10-24 NOTE — Assessment & Plan Note (Signed)
Cont w/Lorazepam prn  Potential benefits of a long term benzodiazepines  use as well as potential risks  and complications were explained to the patient and were aknowledged. 

## 2022-10-24 NOTE — Assessment & Plan Note (Addendum)
Hydrate well Nephrol f/u

## 2022-10-24 NOTE — Progress Notes (Signed)
Subjective:  Patient ID: Kristen Castillo, female    DOB: 1940-11-14  Age: 82 y.o. MRN: [id number]  CC: Follow-up (6 MNTH F/U)   HPI Kristen Castillo presents for pancreatic cyst, vit D def, renal cysts, stones  Outpatient Medications Prior to Visit  Medication Sig Dispense Refill   apixaban (ELIQUIS) 2.5 MG TABS tablet Take 1 tablet (2.5 mg total) by mouth 2 (two) times daily. 180 tablet 3   Bromfenac Sodium (PROLENSA) 0.07 % SOLN Place 1 drop into the left eye daily.     budesonide (RHINOCORT AQUA) 32 MCG/ACT nasal spray Place 1 spray into both nostrils in the morning and at bedtime. (Patient taking differently: Place 1 spray into both nostrils in the morning and at bedtime. Left nostril) 8 mL 5   calcium carbonate (OS-CAL) 600 MG TABS tablet Take 600 mg by mouth daily.      Cholecalciferol (VITAMIN D-1000 MAX ST) 25 MCG (1000 UT) tablet Take 1,000 Units by mouth daily.     denosumab (PROLIA) 60 MG/ML SOLN injection Inject 60 mg into the skin every 6 (six) months. Administer in upper arm, thigh, or abdomen     Difluprednate 0.05 % EMUL Place 1 drop into the left eye 2 (two) times daily.     famotidine (PEPCID) 20 MG tablet Take 20 mg by mouth at bedtime.      LORazepam (ATIVAN) 0.5 MG tablet TAKE ONE TABLET ONCE DAILY AS NEEDED FOR ANXIETY 90 tablet 1   losartan (COZAAR) 25 MG tablet Take 12.5 mg by mouth daily.     Multiple Vitamin (MULTIVITAMIN) tablet Take 1 tablet by mouth every morning.     allopurinol (ZYLOPRIM) 100 MG tablet Take 1 tablet (100 mg total) by mouth daily. (Patient taking differently: Take 200 mg by mouth daily.) 90 tablet 3   diphenoxylate-atropine (LOMOTIL) 2.5-0.025 MG tablet Take 1-2 tablets by mouth 4 (four) times daily as needed for diarrhea or loose stools. 60 tablet 0   nystatin-triamcinolone ointment (MYCOLOG) Apply 1 Application topically 2 (two) times daily.     No facility-administered medications prior to visit.    ROS: Review of Systems   Constitutional:  Negative for activity change, appetite change, chills, fatigue and unexpected weight change.  HENT:  Negative for congestion, mouth sores and sinus pressure.   Eyes:  Negative for visual disturbance.  Respiratory:  Negative for cough and chest tightness.   Gastrointestinal:  Negative for abdominal pain and nausea.  Genitourinary:  Negative for difficulty urinating, frequency and vaginal pain.  Musculoskeletal:  Negative for back pain and gait problem.  Skin:  Negative for pallor and rash.  Neurological:  Negative for dizziness, tremors, weakness, numbness and headaches.  Psychiatric/Behavioral:  Negative for confusion, sleep disturbance and suicidal ideas.     Objective:  BP 110/70 (BP Location: Right Arm, Patient Position: Sitting, Cuff Size: Large)   Pulse 65   Temp 97.9 F (36.6 C) (Oral)   Ht 5\' 2"  (1.575 m)   Wt 123 lb (55.8 kg)   SpO2 97%   BMI 22.50 kg/m   BP Readings from Last 3 Encounters:  10/24/22 110/70  10/01/22 108/68  08/30/22 120/70    Wt Readings from Last 3 Encounters:  10/24/22 123 lb (55.8 kg)  10/15/22 122 lb (55.3 kg)  10/01/22 124 lb (56.2 kg)    Physical Exam Constitutional:      General: She is not in acute distress.    Appearance: She is well-developed.  HENT:  Head: Normocephalic.     Right Ear: External ear normal.     Left Ear: External ear normal.     Nose: Nose normal.  Eyes:     General:        Right eye: No discharge.        Left eye: No discharge.     Conjunctiva/sclera: Conjunctivae normal.     Pupils: Pupils are equal, round, and reactive to light.  Neck:     Thyroid: No thyromegaly.     Vascular: No JVD.     Trachea: No tracheal deviation.  Cardiovascular:     Rate and Rhythm: Normal rate and regular rhythm.     Heart sounds: Normal heart sounds.  Pulmonary:     Effort: No respiratory distress.     Breath sounds: No stridor. No wheezing.  Abdominal:     General: Bowel sounds are normal. There is no  distension.     Palpations: Abdomen is soft. There is no mass.     Tenderness: There is no abdominal tenderness. There is no guarding or rebound.  Musculoskeletal:        General: No tenderness.     Cervical back: Normal range of motion and neck supple. No rigidity.  Lymphadenopathy:     Cervical: No cervical adenopathy.  Skin:    Findings: No erythema or rash.  Neurological:     Cranial Nerves: No cranial nerve deficit.     Motor: No abnormal muscle tone.     Coordination: Coordination normal.     Deep Tendon Reflexes: Reflexes normal.  Psychiatric:        Behavior: Behavior normal.        Thought Content: Thought content normal.        Judgment: Judgment normal.     Lab Results  Component Value Date   WBC 8.3 04/18/2022   HGB 13.4 04/18/2022   HCT 40.3 04/18/2022   PLT 342.0 04/18/2022   GLUCOSE 109 (H) 04/18/2022   CHOL 255 (H) 01/17/2021   TRIG 231.0 (H) 01/17/2021   HDL 69.10 01/17/2021   LDLDIRECT 135.0 01/17/2021   LDLCALC 117 (H) 11/03/2018   ALT 10 04/18/2022   AST 16 04/18/2022   NA 140 04/18/2022   K 4.0 04/18/2022   CL 105 04/18/2022   CREATININE 1.22 (H) 04/18/2022   BUN 34 (H) 04/18/2022   CO2 24 04/18/2022   TSH 4.99 01/17/2021   INR 1.3 (H) 05/11/2019   HGBA1C 6.2 04/18/2022    MM 3D DIAGNOSTIC MAMMOGRAM UNILATERAL RIGHT BREAST  Result Date: 10/09/2022 CLINICAL DATA:  Right breast lump and pain status post fall two months ago. The patient had marked bruising along the right breast which has since resolved. EXAM: DIGITAL DIAGNOSTIC UNILATERAL RIGHT MAMMOGRAM WITH TOMOSYNTHESIS AND CAD; ULTRASOUND RIGHT BREAST LIMITED TECHNIQUE: Right digital diagnostic mammography and breast tomosynthesis was performed. The images were evaluated with computer-aided detection. ; Targeted ultrasound examination of the right breast was performed COMPARISON:  Previous exam(s). ACR Breast Density Category b: There are scattered areas of fibroglandular density. FINDINGS: A  radiopaque BB was placed at the site of the patient's focal symptoms in the upper outer right breast at mid to posterior depth. No focal or suspicious mammographic findings are seen deep to the radiopaque BB. The parenchymal pattern is otherwise unchanged. No suspicious findings in the remainder of the right breast. Targeted ultrasound is performed, showing a few scattered anechoic cysts within the upper-outer quadrant of the right breast. Otherwise, no  focal or suspicious sonographic findings identified. IMPRESSION: No mammographic or sonographic evidence of malignancy on the right. RECOMMENDATION: 1. Clinical follow-up recommended for the symptomatic area of concern in the right breast. Any further workup should be based on clinical grounds. 2. Routine annual screening due in April 2025. I have discussed the findings and recommendations with the patient. If applicable, a reminder letter will be sent to the patient regarding the next appointment. BI-RADS CATEGORY  2: Benign. Electronically Signed   By: Sande Brothers M.D.   On: 10/09/2022 14:30   [name] LIMITED ULTRASOUND INCLUDING AXILLA RIGHT BREAST  Result Date: 10/09/2022 CLINICAL DATA:  Right breast lump and pain status post fall two months ago. The patient had marked bruising along the right breast which has since resolved. EXAM: DIGITAL DIAGNOSTIC UNILATERAL RIGHT MAMMOGRAM WITH TOMOSYNTHESIS AND CAD; ULTRASOUND RIGHT BREAST LIMITED TECHNIQUE: Right digital diagnostic mammography and breast tomosynthesis was performed. The images were evaluated with computer-aided detection. ; Targeted ultrasound examination of the right breast was performed COMPARISON:  Previous exam(s). ACR Breast Density Category b: There are scattered areas of fibroglandular density. FINDINGS: A radiopaque BB was placed at the site of the patient's focal symptoms in the upper outer right breast at mid to posterior depth. No focal or suspicious mammographic findings are seen deep to the  radiopaque BB. The parenchymal pattern is otherwise unchanged. No suspicious findings in the remainder of the right breast. Targeted ultrasound is performed, showing a few scattered anechoic cysts within the upper-outer quadrant of the right breast. Otherwise, no focal or suspicious sonographic findings identified. IMPRESSION: No mammographic or sonographic evidence of malignancy on the right. RECOMMENDATION: 1. Clinical follow-up recommended for the symptomatic area of concern in the right breast. Any further workup should be based on clinical grounds. 2. Routine annual screening due in April 2025. I have discussed the findings and recommendations with the patient. If applicable, a reminder letter will be sent to the patient regarding the next appointment. BI-RADS CATEGORY  2: Benign. Electronically Signed   By: Sande Brothers M.D.   On: 10/09/2022 14:30    Assessment & Plan:   Problem List Items Addressed This Visit     Vitamin D deficiency    On Vit D      Nephrolithiasis    On Allopurinol 200 mg every day po per Urology      Relevant Medications   allopurinol (ZYLOPRIM) 100 MG tablet   GERD (gastroesophageal reflux disease)    On Pepcid      Anxiety, mild    Cont w/Lorazepam prn  Potential benefits of a long term benzodiazepines  use as well as potential risks  and complications were explained to the patient and were aknowledged.      Pancreas cyst - Primary    Repeat MRI in Sept 2022:  abdominal MRI is stable.  There is no change in pancreatic cyst.  There is no change in renal cysts.  It is recommended to repeat abdominal MRI in 2 years (11/2022).      Relevant Orders   MR Abdomen W Wo Contrast   CRI (chronic renal insufficiency), stage 3 (moderate) (HCC)    Hydrate well Nephrol f/u       Other Visit Diagnoses     Intra-abdominal and pelvic swelling, mass and lump, unspecified site       Relevant Orders   MR Abdomen W Wo Contrast         Meds ordered this  encounter  Medications  allopurinol (ZYLOPRIM) 100 MG tablet    Sig: Take 2 tablets (200 mg total) by mouth daily.    Dispense:  180 tablet    Refill:  1      Follow-up: Return in about 6 months (around 04/26/2023) for a follow-up visit.  Sonda Primes, MD

## 2022-10-24 NOTE — Assessment & Plan Note (Signed)
On Pepcid 

## 2022-10-24 NOTE — Assessment & Plan Note (Signed)
Repeat MRI in Sept 2022:  abdominal MRI is stable.  There is no change in pancreatic cyst.  There is no change in renal cysts.  It is recommended to repeat abdominal MRI in 2 years (11/2022).

## 2022-10-24 NOTE — Assessment & Plan Note (Signed)
On Vit D 

## 2022-10-24 NOTE — Assessment & Plan Note (Signed)
On Allopurinol 200 mg every day po per Urology

## 2022-10-28 DIAGNOSIS — K080 Exfoliation of teeth due to systemic causes: Secondary | ICD-10-CM | POA: Diagnosis not present

## 2022-10-30 DIAGNOSIS — Z85828 Personal history of other malignant neoplasm of skin: Secondary | ICD-10-CM | POA: Diagnosis not present

## 2022-10-30 DIAGNOSIS — D692 Other nonthrombocytopenic purpura: Secondary | ICD-10-CM | POA: Diagnosis not present

## 2022-10-30 DIAGNOSIS — L218 Other seborrheic dermatitis: Secondary | ICD-10-CM | POA: Diagnosis not present

## 2022-10-30 DIAGNOSIS — L820 Inflamed seborrheic keratosis: Secondary | ICD-10-CM | POA: Diagnosis not present

## 2022-11-05 ENCOUNTER — Encounter: Payer: Self-pay | Admitting: Internal Medicine

## 2022-11-08 ENCOUNTER — Other Ambulatory Visit: Payer: Medicare Other

## 2022-11-15 ENCOUNTER — Ambulatory Visit (INDEPENDENT_AMBULATORY_CARE_PROVIDER_SITE_OTHER): Payer: Medicare Other | Admitting: Psychology

## 2022-11-15 DIAGNOSIS — F4322 Adjustment disorder with anxiety: Secondary | ICD-10-CM | POA: Diagnosis not present

## 2022-11-15 NOTE — Progress Notes (Addendum)
                11/15/2022   Diagnosis Adjustment Disorder Symptoms Anxiety, chronic worry, insecurity Medication Status No psychotropics Safety N/A If Suicidal or Homicidal State Action Taken: N/A Current Risk:  Medications None Objectives Reduce symptoms of anxiety and chronic worry Client Response full compliance  Service Location Location, [address] [place], [place] [address]  Service Code cpt [phone]  Behavioral activation plan  Identify automatic thoughts  Rationally challenge thoughts or beliefs/cognitive restructuring  Identify/label emotions  Validate/empathize  Provide education, information  Self care activities  Session Notes: Dx.: Adjustment Disorder  Meds.: No psychotropics  Goals: Son and his wife are divorced. She is very close with grandchildren and experiences considerable stress associated with ex-daughter in law's alienation. She is frequently fearful and tearful that her grandchildren will be influenced by their mother and it will damage their relationship. She is seeking counseling to reduce fears and symptoms. Therapy sessions will be on a "as needed basis". Goal date is 12-24.   Patient agrees to an audio session (due to lack of technology comfort) and is aware of the limitations of this platform. They are at home and I am at my home office.  Kristen Castillo (and spouse Kristen Castillo): They express considerable concern about grandson Kristen Castillo. He has been much less involved with friends and has pulled back socially. He does not appear distressed by his lack of social involvement. He does not, however, appear to be upset by his lack of social involvement. We talked about their level of involvement and the best strategy to be supportive without being intrusive. They will talk with son about talking with doctor about growth concerns. They will also wait to observe how Kristen Castillo reacts to his transition to high school. Talked about "warning signs".   Session  notes: [place] and Kristen Castillo)- Kristen Castillo is very isolated socially and "has no friends". He is at the [employer] (a [employer]). They say that the only thing he likes to do is to play on his computer. They say that he is wonderful young man, but exhibits strange behavior. He is consumed with computer games. His school work has suffered. His mother Kristen Castillo does not worry about his lack of social connection. There is no effort to make social connections. Kristen Castillo is at their house every other week for a week at a time. We discussed how to determine if Kristen Castillo's behavior is "abnormal" and what signs to look for. We also discussed setting appropriate parameters.       Garrel Ridgel, PhD Time 7:40a-8:30p 50 min.

## 2022-11-18 DIAGNOSIS — H43391 Other vitreous opacities, right eye: Secondary | ICD-10-CM | POA: Diagnosis not present

## 2022-11-18 DIAGNOSIS — H348322 Tributary (branch) retinal vein occlusion, left eye, stable: Secondary | ICD-10-CM | POA: Diagnosis not present

## 2022-11-18 DIAGNOSIS — H35371 Puckering of macula, right eye: Secondary | ICD-10-CM | POA: Diagnosis not present

## 2022-11-18 DIAGNOSIS — H35352 Cystoid macular degeneration, left eye: Secondary | ICD-10-CM | POA: Diagnosis not present

## 2022-11-22 DIAGNOSIS — M792 Neuralgia and neuritis, unspecified: Secondary | ICD-10-CM | POA: Diagnosis not present

## 2022-11-22 DIAGNOSIS — M2011 Hallux valgus (acquired), right foot: Secondary | ICD-10-CM | POA: Diagnosis not present

## 2022-12-04 DIAGNOSIS — L905 Scar conditions and fibrosis of skin: Secondary | ICD-10-CM | POA: Diagnosis not present

## 2022-12-16 ENCOUNTER — Ambulatory Visit
Admission: RE | Admit: 2022-12-16 | Discharge: 2022-12-16 | Disposition: A | Discharge status: Home / Self Care | Payer: Medicare Other | Source: Ambulatory Visit | Attending: Internal Medicine | Admitting: Internal Medicine

## 2022-12-16 DIAGNOSIS — R1900 Intra-abdominal and pelvic swelling, mass and lump, unspecified site: Secondary | ICD-10-CM

## 2022-12-16 DIAGNOSIS — K862 Cyst of pancreas: Secondary | ICD-10-CM | POA: Diagnosis not present

## 2022-12-16 MED ORDER — GADOPICLENOL 0.5 MMOL/ML IV SOLN
6.0000 mL | Freq: Once | INTRAVENOUS | Status: AC | PRN
  Administered 2022-12-16: 6 mL via INTRAVENOUS

## 2022-12-17 ENCOUNTER — Telehealth: Payer: Self-pay

## 2022-12-22 ENCOUNTER — Encounter: Payer: Self-pay | Admitting: Internal Medicine

## 2022-12-26 ENCOUNTER — Other Ambulatory Visit (HOSPITAL_COMMUNITY): Payer: Self-pay

## 2022-12-26 NOTE — Telephone Encounter (Signed)
Pt ready for scheduling for Prolia on or after : 01/15/23  Out-of-pocket cost due at time of visit: $~327  Primary: [employer] [place] - Medicare Prolia co-insurance: 20% Admin fee co-insurance: 0%  Secondary: N/A Prolia co-insurance:  Admin fee co-insurance:   Medical Benefit Details: Date Benefits were checked: 12/17/22 Deductible: no/ Coinsurance: 20%/ Admin Fee: 0%  Prior Auth: not required PA# Expiration Date:   # of doses approved:  Pharmacy benefit: Copay $456.44 If patient wants fill through the pharmacy benefit please send prescription to:  [facility] , and include estimated need by date in rx notes. Pharmacy will ship medication directly to the office.  Patient not eligible for Prolia Copay Card. Copay Card can make patient's cost as little as $25. Link to apply: [email or web address]://[email or web address]  ** This summary of benefits is an estimation of the patient's out-of-pocket cost. Exact cost may very based on individual plan coverage.

## 2022-12-30 ENCOUNTER — Other Ambulatory Visit: Payer: Self-pay | Admitting: Internal Medicine

## 2022-12-30 DIAGNOSIS — K080 Exfoliation of teeth due to systemic causes: Secondary | ICD-10-CM | POA: Diagnosis not present

## 2023-01-08 NOTE — Telephone Encounter (Signed)
Patient is cleared for PROLIA injection on 01/15/2023 per PA information on 12/26/2022.  Mentioned in provider note last on 07/2022. Medication is CLINIC supplied. [id number]:$[id number]

## 2023-01-09 NOTE — Progress Notes (Signed)
82 y.o. G54P0010 Married Caucasian female here for breast and pelvic exam.   She is followed for a hx of molar pregnancy.  She is status post TAH/BSO.  Her last hCG was 10 on 06/26/21. She had GYN ONC consultation with Dr. Pricilla Holm on 09/03/21 who recommended that the hCG does not need to be checked further and that a level up to 14 is normal in postmenopausal women.   Seen in office here 10/01/22 for a right breast lump post fall. Had dx mammogram and [name] which were benign.   She thinks she has a left underarm lump today. Had prolia injection in the last couple of weeks in her left arm.  She will do her Covid vaccine next week.   PCP: Plotnikov, Georgina Quint, MD   No LMP recorded. Patient has had a hysterectomy.           Sexually active: No.  The current method of family planning is status post hysterectomy.    Exercising: Yes.     walking Smoker:  no  OB History  Gravida Para Term Preterm AB Living  1       1 0  SAB IAB Ectopic Multiple Live Births  1            # Outcome Date GA Lbr Len/2nd Weight Sex Type Anes PTL Lv  1 SAB              Health Maintenance: Pap:  12/04/17 WNL History of abnormal Pap:  no MMG:06/12/22 Breast Density Cat B, BI-RADS CAT 1 neg Colonoscopy:  2016  HM Colonoscopy     This patient has no relevant Health Maintenance data.      BMD:  05/03/20  Result  osteoporosis, scheduled 01-27-23.  PCP prescribing her Prolia.  HIV: n/a Hep C: n/a  Immunization History  Administered Date(s) Administered   Fluad Quad(high Dose 65+) 11/03/2018, 12/10/2019, 12/29/2020, 01/04/2022   H1N1 04/16/2008   Influenza Whole 12/25/2009, 12/09/2011   Influenza, High Dose Seasonal PF 12/26/2015, 01/01/2017   Influenza,inj,Quad PF,6+ Mos 11/25/2014, 12/04/2017   Influenza-Unspecified 03/01/2014   Moderna Covid-19 Vaccine Bivalent Booster 38yrs & up 12/08/2020   Moderna SARS-COV2 Booster Vaccination 07/11/2020   Moderna Sars-Covid-2 Vaccination 03/20/2019, 04/17/2019,  12/29/2019   Pfizer Covid-19 Vaccine Bivalent Booster 27yrs & up 12/26/2021   Pneumococcal Conjugate-13 04/14/2013   Pneumococcal Polysaccharide-23 03/11/2002, 12/25/2009, 09/16/2017   Td 02/09/2009   Tdap 08/26/2019   Zoster Recombinant(Shingrix) 05/20/2018, 08/26/2018   Zoster, Live 02/17/2009      reports that she has never smoked. She has never used smokeless tobacco. She reports that she does not drink alcohol and does not use drugs.  Past Medical History:  Diagnosis Date   Anxiety    Complication of anesthesia    Congestion of both ears    Deviated nasal septum    Diverticulitis    DVT (deep venous thrombosis) (HCC)    Gallstones    GERD (gastroesophageal reflux disease)    Hair loss    Hematuria    in past   History of creation of ostomy (HCC)    past partial colectomy in 05/03/14   History of diverticulitis    History of kidney stones    History of skin cancer    Kidney cysts    Migraines    in past   Molar pregnancy    Osteoarthritis    fingers    Osteoporosis    Osteoporosis    Persistent cough  Pneumonia 2019   PONV (postoperative nausea and vomiting)    sensitive to medications   Skin cancer    Unspecified vitamin D deficiency    Ventral hernia     Past Surgical History:  Procedure Laterality Date   ABDOMINAL HYSTERECTOMY  1972   CYSTOSCOPY WITH RETROGRADE PYELOGRAM, URETEROSCOPY AND STENT PLACEMENT Left 05/21/2019   Procedure: CYSTOSCOPY WITH RETROGRADE PYELOGRAM, URETEROSCOPY AND STENT PLACEMENT;  Surgeon: Sebastian Ache, MD;  Location: [facility];  Service: Urology;  Laterality: Left;  1 HR   CYSTOSCOPY WITH RETROGRADE PYELOGRAM, URETEROSCOPY AND STENT PLACEMENT Left 05/31/2020   Procedure: CYSTOSCOPY WITH RETROGRADE PYELOGRAM, URETEROSCOPY, BASKET EXTRACTION AND STENT PLACEMENT;  Surgeon: Sebastian Ache, MD;  Location: [facility];  Service: Urology;  Laterality: Left;  1 HR   CYSTOSCOPY/URETEROSCOPY/HOLMIUM LASER/STENT PLACEMENT Left 02/26/2019    Procedure: CYSTOSCOPY/URETEROSCOPY/HOLMIUM LASER/STENT PLACEMENT FIRST STAGE;  Surgeon: Sebastian Ache, MD;  Location: [facility];  Service: Urology;  Laterality: Left;  1 HR   EYE SURGERY     FACIAL COSMETIC SURGERY  2009   Soyla Dryer MD in [facility]   HOLMIUM LASER APPLICATION Left 05/21/2019   Procedure: HOLMIUM LASER APPLICATION;  Surgeon: Sebastian Ache, MD;  Location: [facility];  Service: Urology;  Laterality: Left;   HOLMIUM LASER APPLICATION Left 05/31/2020   Procedure: HOLMIUM LASER APPLICATION;  Surgeon: Sebastian Ache, MD;  Location: [facility];  Service: Urology;  Laterality: Left;   ILEO LOOP COLOSTOMY CLOSURE N/A 11/08/2014   Procedure: LAPAROSCOPIC COLOSTOMY CLOSURE WITH PARTIAL COLECTOMY;  Surgeon: Avel Peace, MD;  Location: [facility];  Service: General;  Laterality: N/A;   INSERTION OF MESH N/A 08/01/2015   Procedure: INSERTION OF MESH;  Surgeon: Avel Peace, MD;  Location: [facility];  Service: General;  Laterality: N/A;   LAPAROSCOPIC LYSIS OF ADHESIONS N/A 11/08/2014   Procedure: LAPAROSCOPIC LYSIS OF ADHESIONS FOR 90 MINUTES;  Surgeon: Avel Peace, MD;  Location: [facility];  Service: General;  Laterality: N/A;   LAPAROSCOPIC LYSIS OF ADHESIONS N/A 08/01/2015   Procedure: LAPAROSCOPIC LYSIS OF ADHESIONS;  Surgeon: Avel Peace, MD;  Location: [facility];  Service: General;  Laterality: N/A;   LAPAROSCOPIC PARTIAL COLECTOMY N/A 05/03/2014   Procedure: DIAGNOSTIC LAPAROSCOPY WITH DRAINAGE OF INTRA ABDOMINAL ABSCESS PARTIAL COLECTOMY ;  Surgeon: Avel Peace, MD;  Location: [facility];  Service: General;  Laterality: N/A;  supine position   LAPAROTOMY N/A 05/03/2014   Procedure: EXPLORATORY LAPAROTOMY WITH HARTMAN PROCEDURE;  Surgeon: Avel Peace, MD;  Location: [facility];  Service: General;  Laterality: N/A;   LIPOMA RESECTION     left scapula area   OOPHORECTOMY  1976   REDUCTION MAMMAPLASTY  1994   RHINOPLASTY  1985   TONSILLECTOMY     VENTRAL HERNIA REPAIR N/A 08/01/2015   Procedure:  LAPAROSCOPIC VENTRAL INCISIONAL  HERNIA;  Surgeon: Avel Peace, MD;  Location: [facility];  Service: General;  Laterality: N/A;    Current Outpatient Medications  Medication Sig Dispense Refill   allopurinol (ZYLOPRIM) 100 MG tablet Take 2 tablets (200 mg total) by mouth daily. 180 tablet 1   apixaban (ELIQUIS) 2.5 MG TABS tablet Take 1 tablet (2.5 mg total) by mouth 2 (two) times daily. 180 tablet 1   Bromfenac Sodium (PROLENSA) 0.07 % SOLN Place 1 drop into the left eye daily.     budesonide (RHINOCORT AQUA) 32 MCG/ACT nasal spray Place 1 spray into both nostrils in the morning and at bedtime. (Patient taking differently: Place 1 spray into both nostrils in the morning and  at bedtime. Left nostril) 8 mL 5   calcium carbonate (OS-CAL) 600 MG TABS tablet Take 600 mg by mouth daily.      Cholecalciferol (VITAMIN D-1000 MAX ST) 25 MCG (1000 UT) tablet Take 1,000 Units by mouth daily.     denosumab (PROLIA) 60 MG/ML SOLN injection Inject 60 mg into the skin every 6 (six) months. Administer in upper arm, thigh, or abdomen     Difluprednate 0.05 % EMUL Place 1 drop into the left eye 2 (two) times daily.     famotidine (PEPCID) 20 MG tablet Take 20 mg by mouth at bedtime.      LORazepam (ATIVAN) 0.5 MG tablet TAKE ONE TABLET BY MOUTH ONCE DAILY AS NEEDED FOR ANXIETY 90 tablet 1   losartan (COZAAR) 25 MG tablet Take 12.5 mg by mouth daily.     Multiple Vitamin (MULTIVITAMIN) tablet Take 1 tablet by mouth every morning.     Current Facility-Administered Medications  Medication Dose Route Frequency Provider Last Rate Last Admin   [START ON 07/15/2023] denosumab (PROLIA) injection 60 mg  60 mg Subcutaneous Once Plotnikov, Georgina Quint, MD        Family History  Problem Relation Age of Onset   Arthritis Mother    Colon cancer Mother 37   Heart disease Father    Dementia Father    Diabetes Sister    Cancer Maternal Grandfather        Lung cancer   Heart disease Maternal Grandmother    Stomach cancer  Neg Hx     Review of Systems  All other systems reviewed and are negative.   Exam:   BP 108/64   Pulse 74   Resp 14   Ht 5' 1.5" (1.562 m)   Wt 120 lb (54.4 kg)   BMI 22.31 kg/m     General appearance: alert, cooperative and appears stated age Head: normocephalic, without obvious abnormality, atraumatic Neck: no adenopathy, supple, symmetrical, trachea midline and thyroid normal to inspection and palpation Lungs: clear to auscultation bilaterally Breasts: consistent with reduction. No masses or tenderness, No nipple retraction or dimpling, No nipple discharge or bleeding, No axillary adenopathy Heart: regular rate and rhythm Abdomen: soft, non-tender; no masses, no organomegaly Extremities: extremities normal, atraumatic, no cyanosis or edema Skin: skin color, texture, turgor normal. No rashes or lesions Lymph nodes: cervical, supraclavicular, and axillary nodes normal. Neurologic: grossly normal  Pelvic: External genitalia:  no lesions              No abnormal inguinal nodes palpated.              Urethra:  normal appearing urethra with no masses, tenderness or lesions              Bartholins and Skenes: normal                 Vagina: normal appearing vagina with normal color and discharge, no lesions.  Normal atrophy change of menopause noted.              Cervix: absent              Pap taken: no Bimanual Exam:  Uterus:  absent              Adnexa: no mass, fullness, tenderness              Rectal exam: Yes.  .  Confirms.              Anus:  normal sphincter tone, no lesions  Chaperone was present for exam:   Petra Kuba, CMA   Assessment:  Status post TAH for molar pregnancy in 1970.  Benign.  Levels of hCG have been under 14.  Status post BSO in 1976.  Benign.  Hx DVT/PE following surgeries.  On Eliquis.  Osteoporosis.  Followed by PCP.  On Prolia.  FH colon cancer in mother.  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  Status post  bilateral breast reduction.  Normal exam today.  Hx microscopic hematuria and renal stones.  Low GFR.  Plan: Mammogram screening discussed.  Ok to proceed with routine screening mammogram in April, 2025.  Self breast awareness reviewed. Guidelines for Calcium, Vitamin D, regular exercise program including cardiovascular and weight bearing exercise. We reviewed Dr. Winferd Humphrey recommendations regarding her [facility] monitoring.  She would like to check her hCG next year.  FU yearly and prn.  Patient expresses appreciation today for her care.  I thanked her for being my patient.   25 min  total time was spent for this patient encounter, including preparation, face-to-face counseling with the patient, coordination of care, and documentation of the encounter in addition to doing the breast and pelvic exam.

## 2023-01-15 ENCOUNTER — Ambulatory Visit: Payer: Medicare Other

## 2023-01-15 ENCOUNTER — Ambulatory Visit: Payer: Medicare Other | Admitting: Radiology

## 2023-01-15 DIAGNOSIS — M810 Age-related osteoporosis without current pathological fracture: Secondary | ICD-10-CM

## 2023-01-15 MED ORDER — DENOSUMAB 60 MG/ML ~~LOC~~ SOSY
60.0000 mg | PREFILLED_SYRINGE | Freq: Once | SUBCUTANEOUS | Status: AC
Start: 2023-01-15 — End: 2023-01-15
  Administered 2023-01-15: 60 mg via SUBCUTANEOUS

## 2023-01-15 MED ORDER — DENOSUMAB 60 MG/ML ~~LOC~~ SOSY
60.0000 mg | PREFILLED_SYRINGE | Freq: Once | SUBCUTANEOUS | Status: DC
Start: 2023-07-15 — End: 2023-05-20

## 2023-01-15 NOTE — Progress Notes (Cosign Needed Addendum)
Patient here for Prolia shot. Patient tolerated well with no complications has scheduled for her next Prolia in May  Medical screening examination/treatment/procedure(s) were performed by non-physician practitioner and as supervising physician I was immediately available for consultation/collaboration.  I agree with above. Jacinta Shoe, MD

## 2023-01-21 DIAGNOSIS — H524 Presbyopia: Secondary | ICD-10-CM | POA: Diagnosis not present

## 2023-01-22 DIAGNOSIS — L218 Other seborrheic dermatitis: Secondary | ICD-10-CM | POA: Diagnosis not present

## 2023-01-23 ENCOUNTER — Encounter: Payer: Self-pay | Admitting: Obstetrics and Gynecology

## 2023-01-23 ENCOUNTER — Ambulatory Visit (INDEPENDENT_AMBULATORY_CARE_PROVIDER_SITE_OTHER): Payer: Medicare Other | Admitting: Obstetrics and Gynecology

## 2023-01-23 VITALS — BP 108/64 | HR 74 | Resp 14 | Ht 61.5 in | Wt 120.0 lb

## 2023-01-23 DIAGNOSIS — Z8759 Personal history of other complications of pregnancy, childbirth and the puerperium: Secondary | ICD-10-CM

## 2023-01-23 DIAGNOSIS — Z9189 Other specified personal risk factors, not elsewhere classified: Secondary | ICD-10-CM | POA: Diagnosis not present

## 2023-01-23 DIAGNOSIS — M810 Age-related osteoporosis without current pathological fracture: Secondary | ICD-10-CM

## 2023-01-23 DIAGNOSIS — Z01419 Encounter for gynecological examination (general) (routine) without abnormal findings: Secondary | ICD-10-CM

## 2023-01-23 NOTE — Patient Instructions (Signed)

## 2023-01-27 ENCOUNTER — Ambulatory Visit
Admission: RE | Admit: 2023-01-27 | Discharge: 2023-01-27 | Disposition: A | Discharge status: Home / Self Care | Payer: Medicare Other | Source: Ambulatory Visit | Attending: Internal Medicine | Admitting: Internal Medicine

## 2023-01-27 DIAGNOSIS — Z780 Asymptomatic menopausal state: Secondary | ICD-10-CM

## 2023-01-30 DIAGNOSIS — D1801 Hemangioma of skin and subcutaneous tissue: Secondary | ICD-10-CM | POA: Diagnosis not present

## 2023-01-30 DIAGNOSIS — D692 Other nonthrombocytopenic purpura: Secondary | ICD-10-CM | POA: Diagnosis not present

## 2023-01-30 DIAGNOSIS — D1724 Benign lipomatous neoplasm of skin and subcutaneous tissue of left leg: Secondary | ICD-10-CM | POA: Diagnosis not present

## 2023-01-30 DIAGNOSIS — Z85828 Personal history of other malignant neoplasm of skin: Secondary | ICD-10-CM | POA: Diagnosis not present

## 2023-01-30 DIAGNOSIS — L570 Actinic keratosis: Secondary | ICD-10-CM | POA: Diagnosis not present

## 2023-01-31 DIAGNOSIS — Z780 Asymptomatic menopausal state: Secondary | ICD-10-CM | POA: Diagnosis not present

## 2023-01-31 DIAGNOSIS — H6123 Impacted cerumen, bilateral: Secondary | ICD-10-CM | POA: Diagnosis not present

## 2023-02-18 ENCOUNTER — Ambulatory Visit: Payer: Medicare Other | Admitting: Psychology

## 2023-02-18 DIAGNOSIS — F4322 Adjustment disorder with anxiety: Secondary | ICD-10-CM

## 2023-02-18 NOTE — Progress Notes (Signed)
                               02/18/2023   Diagnosis Adjustment Disorder Symptoms Anxiety, chronic worry, insecurity Medication Status No psychotropics Safety N/A If Suicidal or Homicidal State Action Taken: N/A Current Risk:  Medications None Objectives Reduce symptoms of anxiety and chronic worry Client Response full compliance  Service Location Location, [address] [place], [place] [address]  Service Code cpt [phone]  Behavioral activation plan  Identify automatic thoughts  Rationally challenge thoughts or beliefs/cognitive restructuring  Identify/label emotions  Validate/empathize  Provide education, information  Self care activities  Session Notes: Dx.: Adjustment Disorder  Meds.: No psychotropics  Goals: Son and his wife are divorced. She is very close with grandchildren and experiences considerable stress associated with ex-daughter in law's alienation. She is frequently fearful and tearful that her grandchildren will be influenced by their mother and it will damage their relationship. She is seeking counseling to reduce fears and symptoms. Therapy sessions will be on a "as needed basis". Goal date is 12-25.   Patient agrees to an audio session (due to lack of technology comfort) and is aware of the limitations of this platform. They are at home and I am at my home office.  Khadesha (and spouse Dorene Sorrow): They express considerable concern about grandson [name]. He has been much less involved with friends and has pulled back socially. He does not appear distressed by his lack of social involvement. He does not, however, appear to be upset by his lack of social involvement. We talked about their level of involvement and the best strategy to be supportive without being intrusive. They will talk with son about talking with doctor about growth concerns. They will also wait to observe how [name] reacts to his transition to high school. Talked about  "warning signs".   Session notes: Bonita Quin and Dorene Sorrow)- Their grandson "finally" has an appointment with a therapist and want feedback about how to "tee up" the initial appointment. He has a number of behaviors that concern them and they are hoping it will be addressed in treatment. They shared concerns about the level of support [name] will get from his mother. Have been working hard to maintain a good relationship with ex-daughter in IT trainer. Discussed best approaches to be supportive of [name]'s treatment without being intrusive.          Garrel Ridgel, PhD Time 7:40a-8:30p 50 min.

## 2023-03-01 ENCOUNTER — Encounter: Payer: Self-pay | Admitting: Internal Medicine

## 2023-03-24 DIAGNOSIS — H35352 Cystoid macular degeneration, left eye: Secondary | ICD-10-CM | POA: Diagnosis not present

## 2023-03-24 DIAGNOSIS — H43391 Other vitreous opacities, right eye: Secondary | ICD-10-CM | POA: Diagnosis not present

## 2023-03-24 DIAGNOSIS — H348322 Tributary (branch) retinal vein occlusion, left eye, stable: Secondary | ICD-10-CM | POA: Diagnosis not present

## 2023-03-24 DIAGNOSIS — H35371 Puckering of macula, right eye: Secondary | ICD-10-CM | POA: Diagnosis not present

## 2023-04-01 DIAGNOSIS — L905 Scar conditions and fibrosis of skin: Secondary | ICD-10-CM | POA: Diagnosis not present

## 2023-04-21 DIAGNOSIS — L905 Scar conditions and fibrosis of skin: Secondary | ICD-10-CM | POA: Diagnosis not present

## 2023-04-28 ENCOUNTER — Ambulatory Visit: Payer: Medicare Other | Admitting: Internal Medicine

## 2023-04-29 DIAGNOSIS — K080 Exfoliation of teeth due to systemic causes: Secondary | ICD-10-CM | POA: Diagnosis not present

## 2023-04-30 ENCOUNTER — Ambulatory Visit: Payer: Medicare Other | Admitting: Internal Medicine

## 2023-05-05 DIAGNOSIS — H01114 Allergic dermatitis of left upper eyelid: Secondary | ICD-10-CM | POA: Diagnosis not present

## 2023-05-05 DIAGNOSIS — H1132 Conjunctival hemorrhage, left eye: Secondary | ICD-10-CM | POA: Diagnosis not present

## 2023-05-06 ENCOUNTER — Other Ambulatory Visit: Payer: Self-pay | Admitting: Internal Medicine

## 2023-05-06 NOTE — Telephone Encounter (Signed)
 Copied from CRM [id number]. Topic: Clinical - Medication Refill >> May 06, 2023 12:04 PM Almira Coaster wrote: Most Recent Primary Care Visit:  Provider: Aundra Millet  Department: [facility]  Visit Type: NURSE VISIT  Date: 01/15/2023  Medication: apixaban (ELIQUIS) 2.5 MG TABS tablet (90 day supply)  Has the patient contacted their pharmacy? Yes, pharmacy is calling to place the refill request (Agent: If no, request that the patient contact the pharmacy for the refill. If patient does not wish to contact the pharmacy document the reason why and proceed with request.) (Agent: If yes, when and what did the pharmacy advise?)  Is this the correct pharmacy for this prescription? Yes If no, delete pharmacy and type the correct one.  This is the patient's preferred pharmacy:  [facility] - [place], [place] - [address]  [phone]   Has the prescription been filled recently? No  Is the patient out of the medication? Yes  Has the patient been seen for an appointment in the last year OR does the patient have an upcoming appointment? Yes  Can we respond through MyChart? Yes  Agent: Please be advised that Rx refills may take up to 3 business days. We ask that you follow-up with your pharmacy.

## 2023-05-06 NOTE — Telephone Encounter (Signed)
 Last Fill: 10/24/22  Last OV: 10/24/22 Next OV: 05/15/23  Routing to provider for review/authorization.

## 2023-05-07 MED ORDER — APIXABAN 2.5 MG PO TABS: 2.5000 mg | ORAL_TABLET | Freq: Two times a day (BID) | ORAL | 1 refills | Status: DC

## 2023-05-08 DIAGNOSIS — N1832 Chronic kidney disease, stage 3b: Secondary | ICD-10-CM | POA: Diagnosis not present

## 2023-05-08 LAB — LAB REPORT - SCANNED: Creatinine, POC: 96.3 mg/dL

## 2023-05-09 LAB — PTH, INTACT: PTH: 45

## 2023-05-09 LAB — RENAL FUNCTION PANEL: EGFR: 40

## 2023-05-12 DIAGNOSIS — L821 Other seborrheic keratosis: Secondary | ICD-10-CM | POA: Diagnosis not present

## 2023-05-12 DIAGNOSIS — D692 Other nonthrombocytopenic purpura: Secondary | ICD-10-CM | POA: Diagnosis not present

## 2023-05-12 DIAGNOSIS — D171 Benign lipomatous neoplasm of skin and subcutaneous tissue of trunk: Secondary | ICD-10-CM | POA: Diagnosis not present

## 2023-05-12 DIAGNOSIS — Z85828 Personal history of other malignant neoplasm of skin: Secondary | ICD-10-CM | POA: Diagnosis not present

## 2023-05-14 DIAGNOSIS — I129 Hypertensive chronic kidney disease with stage 1 through stage 4 chronic kidney disease, or unspecified chronic kidney disease: Secondary | ICD-10-CM | POA: Diagnosis not present

## 2023-05-14 DIAGNOSIS — N200 Calculus of kidney: Secondary | ICD-10-CM | POA: Diagnosis not present

## 2023-05-14 DIAGNOSIS — R809 Proteinuria, unspecified: Secondary | ICD-10-CM | POA: Diagnosis not present

## 2023-05-14 DIAGNOSIS — N1832 Chronic kidney disease, stage 3b: Secondary | ICD-10-CM | POA: Diagnosis not present

## 2023-05-15 ENCOUNTER — Ambulatory Visit (INDEPENDENT_AMBULATORY_CARE_PROVIDER_SITE_OTHER): Payer: Medicare Other | Admitting: Internal Medicine

## 2023-05-15 ENCOUNTER — Encounter: Payer: Self-pay | Admitting: Internal Medicine

## 2023-05-15 VITALS — BP 120/70 | HR 75 | Temp 98.6°F | Ht 61.5 in | Wt 120.0 lb

## 2023-05-15 DIAGNOSIS — M810 Age-related osteoporosis without current pathological fracture: Secondary | ICD-10-CM | POA: Diagnosis not present

## 2023-05-15 DIAGNOSIS — M2011 Hallux valgus (acquired), right foot: Secondary | ICD-10-CM | POA: Insufficient documentation

## 2023-05-15 DIAGNOSIS — N1832 Chronic kidney disease, stage 3b: Secondary | ICD-10-CM | POA: Diagnosis not present

## 2023-05-15 DIAGNOSIS — E559 Vitamin D deficiency, unspecified: Secondary | ICD-10-CM | POA: Diagnosis not present

## 2023-05-15 DIAGNOSIS — N183 Chronic kidney disease, stage 3 unspecified: Secondary | ICD-10-CM | POA: Diagnosis not present

## 2023-05-15 DIAGNOSIS — N200 Calculus of kidney: Secondary | ICD-10-CM

## 2023-05-15 DIAGNOSIS — K219 Gastro-esophageal reflux disease without esophagitis: Secondary | ICD-10-CM

## 2023-05-15 DIAGNOSIS — M2041 Other hammer toe(s) (acquired), right foot: Secondary | ICD-10-CM | POA: Insufficient documentation

## 2023-05-15 DIAGNOSIS — Z86718 Personal history of other venous thrombosis and embolism: Secondary | ICD-10-CM

## 2023-05-15 MED ORDER — APIXABAN 2.5 MG PO TABS: 2.5000 mg | ORAL_TABLET | Freq: Two times a day (BID) | ORAL | 1 refills | Status: DC

## 2023-05-15 NOTE — Assessment & Plan Note (Signed)
No relapse 

## 2023-05-15 NOTE — Assessment & Plan Note (Signed)
Continue with Eliquis. ?

## 2023-05-15 NOTE — Assessment & Plan Note (Addendum)
 On Prolia, Vit D  Vit K2 - decided not to take it

## 2023-05-15 NOTE — Progress Notes (Signed)
 Subjective:  Patient ID: Kristen Castillo, female    DOB: 1941-02-26  Age: 83 y.o. MRN: [id number]  CC: Medical Management of Chronic Issues (6 MNTH F/U)   HPI Kristen Castillo presents for CRI, anxiety, anticoagulation Labs w/Dr Malen Gauze  Outpatient Medications Prior to Visit  Medication Sig Dispense Refill   allopurinol (ZYLOPRIM) 100 MG tablet Take 2 tablets (200 mg total) by mouth daily. 180 tablet 1   Bromfenac Sodium (PROLENSA) 0.07 % SOLN Place 1 drop into the left eye daily.     budesonide (RHINOCORT AQUA) 32 MCG/ACT nasal spray Place 1 spray into both nostrils in the morning and at bedtime. (Patient taking differently: Place 1 spray into both nostrils in the morning and at bedtime. Left nostril) 8 mL 5   calcium carbonate (OS-CAL) 600 MG TABS tablet Take 600 mg by mouth daily.      Cholecalciferol (VITAMIN D-1000 MAX ST) 25 MCG (1000 UT) tablet Take 1,000 Units by mouth daily.     denosumab (PROLIA) 60 MG/ML SOLN injection Inject 60 mg into the skin every 6 (six) months. Administer in upper arm, thigh, or abdomen     Difluprednate 0.05 % EMUL Place 1 drop into the left eye 2 (two) times daily.     famotidine (PEPCID) 20 MG tablet Take 20 mg by mouth at bedtime.      LORazepam (ATIVAN) 0.5 MG tablet TAKE ONE TABLET BY MOUTH ONCE DAILY AS NEEDED FOR ANXIETY 90 tablet 1   losartan (COZAAR) 25 MG tablet Take 12.5 mg by mouth daily.     Multiple Vitamin (MULTIVITAMIN) tablet Take 1 tablet by mouth every morning.     neomycin-polymyxin b-dexamethasone (MAXITROL) 3.5-10000-0.1 OINT Place 1 Application into both eyes 3 (three) times daily.     apixaban (ELIQUIS) 2.5 MG TABS tablet Take 1 tablet (2.5 mg total) by mouth 2 (two) times daily. 180 tablet 1   Facility-Administered Medications Prior to Visit  Medication Dose Route Frequency Provider Last Rate Last Admin   [START ON 07/15/2023] denosumab (PROLIA) injection 60 mg  60 mg Subcutaneous Once Niley Helbig V, MD         ROS: Review of Systems  Constitutional:  Negative for activity change, appetite change, chills, fatigue and unexpected weight change.  HENT:  Negative for congestion, mouth sores and sinus pressure.   Eyes:  Negative for visual disturbance.  Respiratory:  Negative for cough and chest tightness.   Gastrointestinal:  Negative for abdominal pain and nausea.  Genitourinary:  Negative for difficulty urinating, frequency and vaginal pain.  Musculoskeletal:  Negative for back pain and gait problem.  Skin:  Negative for pallor and rash.  Neurological:  Negative for dizziness, tremors, weakness, numbness and headaches.  Psychiatric/Behavioral:  Negative for confusion, sleep disturbance and suicidal ideas.     Objective:  BP 120/70   Pulse 75   Temp 98.6 F (37 C) (Oral)   Ht 5' 1.5" (1.562 m)   Wt 120 lb (54.4 kg)   SpO2 97%   BMI 22.31 kg/m   BP Readings from Last 3 Encounters:  05/15/23 120/70  01/23/23 108/64  10/24/22 110/70    Wt Readings from Last 3 Encounters:  05/15/23 120 lb (54.4 kg)  01/23/23 120 lb (54.4 kg)  10/24/22 123 lb (55.8 kg)    Physical Exam Constitutional:      General: She is not in acute distress.    Appearance: Normal appearance. She is well-developed.  HENT:     Head:  Normocephalic.     Right Ear: External ear normal.     Left Ear: External ear normal.     Nose: Nose normal.  Eyes:     General:        Right eye: No discharge.        Left eye: No discharge.     Conjunctiva/sclera: Conjunctivae normal.     Pupils: Pupils are equal, round, and reactive to light.  Neck:     Thyroid: No thyromegaly.     Vascular: No JVD.     Trachea: No tracheal deviation.  Cardiovascular:     Rate and Rhythm: Normal rate and regular rhythm.     Heart sounds: Normal heart sounds.  Pulmonary:     Effort: No respiratory distress.     Breath sounds: No stridor. No wheezing.  Abdominal:     General: Bowel sounds are normal. There is no distension.      Palpations: Abdomen is soft. There is no mass.     Tenderness: There is no abdominal tenderness. There is no guarding or rebound.  Musculoskeletal:        General: No tenderness.     Cervical back: Normal range of motion and neck supple. No rigidity.  Lymphadenopathy:     Cervical: No cervical adenopathy.  Skin:    Findings: No erythema or rash.  Neurological:     Cranial Nerves: No cranial nerve deficit.     Motor: No abnormal muscle tone.     Coordination: Coordination normal.     Deep Tendon Reflexes: Reflexes normal.  Psychiatric:        Behavior: Behavior normal.        Thought Content: Thought content normal.        Judgment: Judgment normal.     Lab Results  Component Value Date   WBC 8.3 04/18/2022   HGB 13.4 04/18/2022   HCT 40.3 04/18/2022   PLT 342.0 04/18/2022   GLUCOSE 109 (H) 04/18/2022   CHOL 255 (H) 01/17/2021   TRIG 231.0 (H) 01/17/2021   HDL 69.10 01/17/2021   LDLDIRECT 135.0 01/17/2021   LDLCALC 117 (H) 11/03/2018   ALT 10 04/18/2022   AST 16 04/18/2022   NA 140 04/18/2022   K 4.0 04/18/2022   CL 105 04/18/2022   CREATININE 1.22 (H) 04/18/2022   BUN 34 (H) 04/18/2022   CO2 24 04/18/2022   TSH 4.99 01/17/2021   INR 1.3 (H) 05/11/2019   HGBA1C 6.2 04/18/2022    DG Bone Density Result Date: 01/31/2023 Table formatting from the original result was not included. Date of study: 01/27/2023 Exam: DUAL X-RAY ABSORPTIOMETRY (DXA) FOR BONE MINERAL DENSITY (BMD) Instrument: [employer] Requesting Provider: PCP Indication: screening for osteoporosis Comparison:  none (please note that it is not possible to compare data from different instruments) Clinical data: Pt is a 83 y.o. female with history of fracture. Results:  Lumbar spine L1-L4 Femoral neck (FN) T-score   -0.9 RFN: -2.4 LFN: -2.8 Assessment: Patient has OSTEOPOROSIS according to the [facility] classification for osteoporosis (see below). Fracture risk: high Comments: the technical quality of the study  is good. In patients older than 75, the high prevalence of degenerative changes in the lumbar spine results in artificially higher bone density readings. WHO criteria for diagnosis of osteoporosis in postmenopausal women and in men 66 y/o or older: - normal: T-score -1.0 to + 1.0 - osteopenia/low bone density: T-score between -2.5 and -1.0 - osteoporosis: T-score below -2.5 - severe  osteoporosis: T-score below -2.5 with history of fragility fracture Note: although not part of the WHO classification, the presence of a fragility fracture, regardless of the T-score, should be considered diagnostic of osteoporosis, provided other causes for the fracture have been excluded. Treatment: The National Osteoporosis Foundation recommends that treatment be considered in postmenopausal women and men age 65 or older with: 1. Hip or vertebral (clinical or morphometric) fracture 2. T-score of - 2.5 or lower at the spine or hip 3. 10-year fracture probability by FRAX of at least 20% for a major osteoporotic fracture and 3% for a hip fracture RECOMMENDATION: 1. All patients should optimize calcium and vitamin D intake. 2. Consider FDA-approved medical therapies in postmenopausal women and men aged 58 years and older, based on the following: a. A hip or vertebral(clinical or morphometric) fracture. b. T-Score of  -2.5 or less at the femoral neck , total hip or spine after appropriate evaluation to exclude secondary causes c. Low bone mass (T-score between -1.0 and -2.5 at the femoral neck or spine) and a 10 year probability of a hip fracture >3% or a 10 year probability of major osteoporosis-related fracture > 20% based on the [place]-adapted WHO algorithm d. Clinical judgement and/or patient preferences may indicate treatment for people with 10-year fracture probabilities above or below these levels Followup: Repeat BMD in 2 -3 years or as clinically indicated Interpreted by : Lyndle Herrlich, MD [facility]     Assessment & Plan:   Problem List Items Addressed This Visit     Vitamin D deficiency - Primary   On Vit D      Osteoporosis   On Prolia, Vit D  Vit K2 - decided not to take it      Nephrolithiasis   No relapse      GERD (gastroesophageal reflux disease)   On Pepcid      History of DVT in adulthood   Continue with Eliquis      CRI (chronic renal insufficiency)   F/u w/Nephrolology -  Dr Malen Gauze       CRI (chronic renal insufficiency), stage 3 (moderate) (HCC)   Hydrate well Nephrol f/u          Meds ordered this encounter  Medications   apixaban (ELIQUIS) 2.5 MG TABS tablet    Sig: Take 1 tablet (2.5 mg total) by mouth 2 (two) times daily.    Dispense:  180 tablet    Refill:  1      Follow-up: Return in about 3 months (around 08/15/2023) for a follow-up visit.  Sonda Primes, MD

## 2023-05-15 NOTE — Assessment & Plan Note (Signed)
 F/u w/Nephrolology -  Dr Malen Gauze

## 2023-05-15 NOTE — Assessment & Plan Note (Signed)
Hydrate well Nephrol f/u

## 2023-05-15 NOTE — Assessment & Plan Note (Signed)
 On Pepcid

## 2023-05-15 NOTE — Assessment & Plan Note (Signed)
 On Vit D

## 2023-05-20 ENCOUNTER — Other Ambulatory Visit: Payer: Self-pay

## 2023-05-20 DIAGNOSIS — M810 Age-related osteoporosis without current pathological fracture: Secondary | ICD-10-CM

## 2023-05-20 MED ORDER — DENOSUMAB 60 MG/ML ~~LOC~~ SOSY
60.0000 mg | PREFILLED_SYRINGE | Freq: Once | SUBCUTANEOUS | Status: AC
Start: 2023-07-15 — End: 2023-07-21
  Administered 2023-07-21: 60 mg via SUBCUTANEOUS

## 2023-06-02 DIAGNOSIS — N200 Calculus of kidney: Secondary | ICD-10-CM | POA: Diagnosis not present

## 2023-06-09 DIAGNOSIS — N202 Calculus of kidney with calculus of ureter: Secondary | ICD-10-CM | POA: Diagnosis not present

## 2023-06-09 DIAGNOSIS — N281 Cyst of kidney, acquired: Secondary | ICD-10-CM | POA: Diagnosis not present

## 2023-06-09 DIAGNOSIS — E790 Hyperuricemia without signs of inflammatory arthritis and tophaceous disease: Secondary | ICD-10-CM | POA: Diagnosis not present

## 2023-06-24 DIAGNOSIS — G43909 Migraine, unspecified, not intractable, without status migrainosus: Secondary | ICD-10-CM | POA: Diagnosis not present

## 2023-06-25 ENCOUNTER — Other Ambulatory Visit: Payer: Self-pay | Admitting: Internal Medicine

## 2023-06-30 ENCOUNTER — Telehealth: Payer: Self-pay

## 2023-06-30 NOTE — Telephone Encounter (Signed)
 Prolia VOB initiated via [email or web address]  Next Prolia inj DUE: 07/15/23

## 2023-07-01 ENCOUNTER — Other Ambulatory Visit (HOSPITAL_COMMUNITY): Payer: Self-pay

## 2023-07-01 DIAGNOSIS — K080 Exfoliation of teeth due to systemic causes: Secondary | ICD-10-CM | POA: Diagnosis not present

## 2023-07-01 NOTE — Telephone Encounter (Signed)
 Copied from CRM [id number]. Topic: General - Other >> Jul 01, 2023 12:33 PM Adonis Hoot wrote: Reason for CRM: [employer] called to let  provider know that patient has been approved for prolia  injection. They will be faxing over information as well.

## 2023-07-01 NOTE — Telephone Encounter (Signed)
 Kristen Castillo

## 2023-07-01 NOTE — Telephone Encounter (Signed)
 PA submitted via CMM for pharmacy benefit. Key: [id number]

## 2023-07-02 ENCOUNTER — Other Ambulatory Visit (HOSPITAL_COMMUNITY): Payer: Self-pay

## 2023-07-02 NOTE — Telephone Encounter (Signed)
 Pt ready for scheduling for PROLIA  on or after : 07/15/23  Option# 1: Buy/Bill (Office supplied medication)  Out-of-pocket cost due at time of clinic visit: $332  Number of injection/visits approved: 2  Primary: [employer] Prolia  co-insurance: 20% Admin fee co-insurance: 0%  Secondary: --- Prolia  co-insurance:  Admin fee co-insurance:   Medical Benefit Details: Date Benefits were checked: 06/30/23 Deductible: NO/ Coinsurance: 20%/ Admin Fee: 0%  Prior Auth: N/A PA# Expiration Date:   # of doses approved: ----------------------------------------------------------------------- Option# 2- Med Obtained from pharmacy:  Pharmacy benefit: Copay $297 (Paid to pharmacy) Admin Fee: 0% (Pay at clinic)  Prior Auth: APPROVED PA# [id number] Expiration Date: 07/01/23-06/30/24  # of doses approved: 2   If patient wants fill through the pharmacy benefit please send prescription to:  [facility] , and include estimated need by date in rx notes. Pharmacy will ship medication directly to the office.  Patient NOT eligible for Prolia  Copay Card. Copay Card can make patient's cost as little as $25. Link to apply: [email or web address]  ** This summary of benefits is an estimation of the patient's out-of-pocket cost. Exact cost may very based on individual plan coverage.

## 2023-07-15 DIAGNOSIS — K080 Exfoliation of teeth due to systemic causes: Secondary | ICD-10-CM | POA: Diagnosis not present

## 2023-07-17 ENCOUNTER — Ambulatory Visit: Payer: Medicare Other | Admitting: Emergency Medicine

## 2023-07-17 ENCOUNTER — Encounter: Payer: Self-pay | Admitting: Emergency Medicine

## 2023-07-17 ENCOUNTER — Ambulatory Visit: Payer: Medicare Other

## 2023-07-17 VITALS — BP 130/71 | HR 97 | Ht 61.0 in | Wt 122.2 lb

## 2023-07-17 DIAGNOSIS — Z7901 Long term (current) use of anticoagulants: Secondary | ICD-10-CM | POA: Diagnosis not present

## 2023-07-17 DIAGNOSIS — I2699 Other pulmonary embolism without acute cor pulmonale: Secondary | ICD-10-CM

## 2023-07-17 NOTE — Assessment & Plan Note (Signed)
 Remote pulmonary embolism.  We have decided to continue to treat with prophylactic dose Eliquis .  She is tolerating well.  Plan continue.  I am glad that you are doing well. Please continue Eliquis  2.5 mg twice a day. Okay to take brief break from the Eliquis  if you need to do so for procedures, surgery, etc.  We can review if such a situation arises Follow Dr. Baldwin Levee in 1 year, call sooner if you have any problems.

## 2023-07-17 NOTE — Progress Notes (Signed)
   Subjective:    Patient ID: Kristen Castillo, female    DOB: 05/14/1940, 83 y.o.   MRN: [id number]  HPI  ROV 08/22/2022 --83 year old woman with a history of a provoked large pulmonary embolism following bowel surgery and mesh placement with associated right heart strain.  She underwent targeted lysis and had improvement on her echocardiogram.  We have been maintaining her on Eliquis  2.5 mg twice daily.  She also deals with chronic cough in the setting of GERD and allergic rhinitis.  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 Today she reports that she had a fall on her R side 1 week ago, hit her R hand, R side and breast. She has a bruise. No SOB, no cough.   Chest x-ray and dedicated rib films done 08/21/2022 reviewed by me.  There may be a very slight hyperlucency that could be consistent with cracked fifth rib -equivocal, nondisplaced.    ROV 07/17/2023 --follow-up visit for 83 year old woman with a history of pulmonary embolism for which she underwent targeted lysis.  She had associated PAH that improved on subsequent echocardiogram.  We have been maintaining her on Eliquis  2.5 mg twice daily.  Also with a history of GERD, rhinitis and associated chronic cough. She reports today that she is doing well. No falls. No bleeding. Very active. She is having some allergy and associated cough right now - not severe. Uses rhinocort  on the L at bedtime.    Review of Systems As per HPI     Objective:   Physical Exam Vitals:   07/17/23 1020  BP: 130/71  Pulse: 97  SpO2: 100%  Weight: 122 lb 3.2 oz (55.4 kg)  Height: 5\' 1"  (1.549 m)    Gen: Pleasant, well-nourished, in no distress,  normal affect  ENT: No lesions,  mouth clear,  oropharynx clear, no postnasal drip, strong voice  Neck: No JVD, no stridor  Lungs: No use of accessory muscles, clear B, no wheeze or crackles.   Cardiovascular: RRR, heart sounds normal, no  murmur or gallops, no edema  Musculoskeletal: She has a resolving small linear bruise on lateral aspect of right breast  Neuro: alert, non focal  Skin: Warm, no lesions or rashes      Assessment & Plan:  Pulmonary embolism (HCC) Remote pulmonary embolism.  We have decided to continue to treat with prophylactic dose Eliquis .  She is tolerating well.  Plan continue.  I am glad that you are doing well. Please continue Eliquis  2.5 mg twice a day. Okay to take brief break from the Eliquis  if you need to do so for procedures, surgery, etc.  We can review if such a situation arises Follow Dr. Baldwin Levee in 1 year, call sooner if you have any problems.   [name], MD, PhD 07/17/2023, 10:39 AM [facility] [phone] or if no answer [phone]

## 2023-07-17 NOTE — Patient Instructions (Signed)
 I am glad that you are doing well. Please continue Eliquis  2.5 mg twice a day. Okay to take brief break from the Eliquis  if you need to do so for procedures, surgery, etc.  We can review if such a situation arises Follow Dr. Baldwin Levee in 1 year, call sooner if you have any problems.

## 2023-07-21 ENCOUNTER — Ambulatory Visit (INDEPENDENT_AMBULATORY_CARE_PROVIDER_SITE_OTHER)

## 2023-07-21 ENCOUNTER — Encounter: Payer: Self-pay | Admitting: Internal Medicine

## 2023-07-21 ENCOUNTER — Ambulatory Visit (INDEPENDENT_AMBULATORY_CARE_PROVIDER_SITE_OTHER): Admitting: Internal Medicine

## 2023-07-21 VITALS — BP 132/80 | HR 91 | Temp 98.0°F | Ht 61.0 in | Wt 119.0 lb

## 2023-07-21 DIAGNOSIS — S8011XA Contusion of right lower leg, initial encounter: Secondary | ICD-10-CM

## 2023-07-21 DIAGNOSIS — M810 Age-related osteoporosis without current pathological fracture: Secondary | ICD-10-CM | POA: Diagnosis not present

## 2023-07-21 MED ORDER — DENOSUMAB 60 MG/ML ~~LOC~~ SOSY
60.0000 mg | PREFILLED_SYRINGE | Freq: Once | SUBCUTANEOUS | Status: AC
Start: 2024-01-21 — End: 2024-01-29
  Administered 2024-01-29: 60 mg via SUBCUTANEOUS

## 2023-07-21 NOTE — Progress Notes (Cosign Needed Addendum)
 Prolia  injection given w/o complications.   Next dose ordered for 6 months.

## 2023-07-21 NOTE — Progress Notes (Signed)
 Subjective:    Patient ID: Kristen Castillo, female    DOB: Jun 24, 1940, 83 y.o.   MRN: [id number]      HPI Kristen Castillo is here for  Chief Complaint  Patient presents with   Bruise on leg    Bruise on right knee (happened last week while she was at her grandson's baseball game. Owner had a dog and yanked the chain and dog brushed up against her leg)    2 weeks ago - dog ran into her her right lower leg.  Had mild bruise in leg --- went away then 2 days ago she developed a swelling in that area that is painful to touch.  She was not sure if that was related to the dog getting her there or if it was a blood clot.  She was concerned about it and wanted to have it looked at.     Medications and allergies reviewed with patient and updated if appropriate.  Current Outpatient Medications on File Prior to Visit  Medication Sig Dispense Refill   allopurinol  (ZYLOPRIM ) 100 MG tablet Take 2 tablets (200 mg total) by mouth daily. 180 tablet 1   apixaban  (ELIQUIS ) 2.5 MG TABS tablet Take 1 tablet (2.5 mg total) by mouth 2 (two) times daily. 180 tablet 1   Bromfenac Sodium (PROLENSA) 0.07 % SOLN Place 1 drop into the left eye daily.     budesonide  (RHINOCORT  AQUA) 32 MCG/ACT nasal spray Place 1 spray into both nostrils in the morning and at bedtime. (Patient taking differently: Place 1 spray into both nostrils in the morning and at bedtime. Left nostril) 8 mL 5   calcium  carbonate (OS-CAL) 600 MG TABS tablet Take 600 mg by mouth daily.     Cholecalciferol  (VITAMIN D -1000 MAX ST) 25 MCG (1000 UT) tablet Take 1,000 Units by mouth daily. Patient taking twice a month     denosumab  (PROLIA ) 60 MG/ML SOLN injection Inject 60 mg into the skin every 6 (six) months. Administer in upper arm, thigh, or abdomen     Difluprednate 0.05 % EMUL Place 1 drop into the left eye 2 (two) times daily.     famotidine  (PEPCID ) 20 MG tablet Take 20 mg by mouth at bedtime.      LORazepam  (ATIVAN ) 0.5 MG tablet TAKE ONE  TABLET BY MOUTH ONCE DAILY AS NEEDED FOR ANXIETY 90 tablet 1   losartan (COZAAR) 25 MG tablet Take 12.5 mg by mouth daily.     Multiple Vitamin (MULTIVITAMIN) tablet Take 1 tablet by mouth every morning.     neomycin-polymyxin b-dexamethasone  (MAXITROL) 3.5-10000-0.1 OINT Place 1 Application into both eyes 3 (three) times daily.     Current Facility-Administered Medications on File Prior to Visit  Medication Dose Route Frequency Provider Last Rate Last Admin   [START ON 01/21/2024] denosumab  (PROLIA ) injection 60 mg  60 mg Subcutaneous Once Plotnikov, Aleksei V, MD        Review of Systems     Objective:   Vitals:   07/21/23 1515  BP: 132/80  Pulse: 91  Temp: 98 F (36.7 C)  SpO2: 98%   BP Readings from Last 3 Encounters:  07/21/23 132/80  07/17/23 130/71  05/15/23 120/70   Wt Readings from Last 3 Encounters:  07/21/23 119 lb (54 kg)  07/17/23 122 lb 3.2 oz (55.4 kg)  05/15/23 120 lb (54.4 kg)   Body mass index is 22.48 kg/m.    Physical Exam Constitutional:      General: She  is not in acute distress.    Appearance: Normal appearance. She is not ill-appearing.  HENT:     Head: Normocephalic and atraumatic.  Skin:    General: Skin is warm and dry.     Comments: Right proximal medial leg just below knee joint there is a area of swelling that is slightly tender.  There is an area of induration approximately 3 cm x 2 cm that feels like a hematoma.  No fluctuance.  Possible faint overlying resolving bruise  Neurological:     Mental Status: She is alert.            Assessment & Plan:    Hematoma right lower leg: Acute 2 weeks ago a dog hit her right lower proximal medial leg and she had a very small bruise there that resolved, but the past couple of days she has developed a swelling that is slightly tender there Exam consistent with hematoma Reassured her this will resolve with time Would not expect it to get larger and pain should still be increased She  will monitor it No evaluation necessary at this time She will call with any questions or concerns.

## 2023-07-21 NOTE — Patient Instructions (Addendum)
     The tender area on your right leg is likely a hematoma.  This will go away but may take weeks for it to go away.      Return if symptoms worsen or fail to improve.

## 2023-07-31 DIAGNOSIS — H348322 Tributary (branch) retinal vein occlusion, left eye, stable: Secondary | ICD-10-CM | POA: Diagnosis not present

## 2023-07-31 DIAGNOSIS — H43391 Other vitreous opacities, right eye: Secondary | ICD-10-CM | POA: Diagnosis not present

## 2023-07-31 DIAGNOSIS — H35352 Cystoid macular degeneration, left eye: Secondary | ICD-10-CM | POA: Diagnosis not present

## 2023-07-31 DIAGNOSIS — H35371 Puckering of macula, right eye: Secondary | ICD-10-CM | POA: Diagnosis not present

## 2023-08-05 ENCOUNTER — Other Ambulatory Visit: Payer: Self-pay | Admitting: Obstetrics and Gynecology

## 2023-08-05 DIAGNOSIS — Z0000 Encounter for general adult medical examination without abnormal findings: Secondary | ICD-10-CM

## 2023-08-18 DIAGNOSIS — J3489 Other specified disorders of nose and nasal sinuses: Secondary | ICD-10-CM | POA: Diagnosis not present

## 2023-08-18 DIAGNOSIS — H903 Sensorineural hearing loss, bilateral: Secondary | ICD-10-CM | POA: Diagnosis not present

## 2023-08-18 DIAGNOSIS — Z85828 Personal history of other malignant neoplasm of skin: Secondary | ICD-10-CM | POA: Diagnosis not present

## 2023-08-18 DIAGNOSIS — L218 Other seborrheic dermatitis: Secondary | ICD-10-CM | POA: Diagnosis not present

## 2023-08-18 DIAGNOSIS — H6123 Impacted cerumen, bilateral: Secondary | ICD-10-CM | POA: Diagnosis not present

## 2023-08-18 DIAGNOSIS — L570 Actinic keratosis: Secondary | ICD-10-CM | POA: Diagnosis not present

## 2023-08-18 DIAGNOSIS — R040 Epistaxis: Secondary | ICD-10-CM | POA: Diagnosis not present

## 2023-08-19 ENCOUNTER — Ambulatory Visit

## 2023-08-21 ENCOUNTER — Ambulatory Visit
Admission: RE | Admit: 2023-08-21 | Discharge: 2023-08-21 | Disposition: A | Discharge status: Home / Self Care | Source: Ambulatory Visit | Attending: Obstetrics and Gynecology | Admitting: Obstetrics and Gynecology

## 2023-08-21 DIAGNOSIS — Z0000 Encounter for general adult medical examination without abnormal findings: Secondary | ICD-10-CM

## 2023-08-21 DIAGNOSIS — Z1231 Encounter for screening mammogram for malignant neoplasm of breast: Secondary | ICD-10-CM | POA: Diagnosis not present

## 2023-08-26 ENCOUNTER — Ambulatory Visit: Payer: Self-pay | Admitting: Obstetrics and Gynecology

## 2023-09-02 DIAGNOSIS — K080 Exfoliation of teeth due to systemic causes: Secondary | ICD-10-CM | POA: Diagnosis not present

## 2023-09-04 DIAGNOSIS — H903 Sensorineural hearing loss, bilateral: Secondary | ICD-10-CM | POA: Diagnosis not present

## 2023-09-08 ENCOUNTER — Other Ambulatory Visit (HOSPITAL_BASED_OUTPATIENT_CLINIC_OR_DEPARTMENT_OTHER): Payer: Self-pay

## 2023-09-08 ENCOUNTER — Other Ambulatory Visit (HOSPITAL_COMMUNITY): Payer: Self-pay

## 2023-09-08 ENCOUNTER — Other Ambulatory Visit: Payer: Self-pay

## 2023-09-08 MED ORDER — BROMFENAC SODIUM 0.07 % OP SOLN
1.0000 [drp] | Freq: Every day | OPHTHALMIC | 4 refills | Status: AC
  Filled 2023-09-08: qty 3, 60d supply, fill #0

## 2023-09-13 ENCOUNTER — Other Ambulatory Visit (HOSPITAL_COMMUNITY): Payer: Self-pay

## 2023-09-24 DIAGNOSIS — R040 Epistaxis: Secondary | ICD-10-CM | POA: Diagnosis not present

## 2023-10-13 ENCOUNTER — Encounter: Payer: Self-pay | Admitting: Internal Medicine

## 2023-10-13 NOTE — Progress Notes (Unsigned)
    Subjective:    Patient ID: Kristen Castillo, female    DOB: 1940-08-12, 83 y.o.   MRN: [id number]      HPI Kristen Castillo is here for No chief complaint on file.    Follow up of RLL hematoma.  Beginning of may - dog hit her RLL and developed bruising - > hematoma.      Medications and allergies reviewed with patient and updated if appropriate.  Current Outpatient Medications on File Prior to Visit  Medication Sig Dispense Refill   allopurinol  (ZYLOPRIM ) 100 MG tablet Take 2 tablets (200 mg total) by mouth daily. 180 tablet 1   apixaban  (ELIQUIS ) 2.5 MG TABS tablet Take 1 tablet (2.5 mg total) by mouth 2 (two) times daily. 180 tablet 1   Bromfenac  Sodium (PROLENSA ) 0.07 % SOLN Place 1 drop into the left eye daily.     Bromfenac  Sodium 0.07 % SOLN Place 1 drop into the left eye daily. 3 mL 4   budesonide  (RHINOCORT  AQUA) 32 MCG/ACT nasal spray Place 1 spray into both nostrils in the morning and at bedtime. (Patient taking differently: Place 1 spray into both nostrils in the morning and at bedtime. Left nostril) 8 mL 5   calcium  carbonate (OS-CAL) 600 MG TABS tablet Take 600 mg by mouth daily.     Cholecalciferol  (VITAMIN D -1000 MAX ST) 25 MCG (1000 UT) tablet Take 1,000 Units by mouth daily. Patient taking twice a month     denosumab  (PROLIA ) 60 MG/ML SOLN injection Inject 60 mg into the skin every 6 (six) months. Administer in upper arm, thigh, or abdomen     Difluprednate 0.05 % EMUL Place 1 drop into the left eye 2 (two) times daily.     famotidine  (PEPCID ) 20 MG tablet Take 20 mg by mouth at bedtime.      LORazepam  (ATIVAN ) 0.5 MG tablet TAKE ONE TABLET BY MOUTH ONCE DAILY AS NEEDED FOR ANXIETY 90 tablet 1   losartan (COZAAR) 25 MG tablet Take 12.5 mg by mouth daily.     Multiple Vitamin (MULTIVITAMIN) tablet Take 1 tablet by mouth every morning.     neomycin-polymyxin b-dexamethasone  (MAXITROL) 3.5-10000-0.1 OINT Place 1 Application into both eyes 3 (three) times daily.      Current Facility-Administered Medications on File Prior to Visit  Medication Dose Route Frequency Provider Last Rate Last Admin   [START ON 01/21/2024] denosumab  (PROLIA ) injection 60 mg  60 mg Subcutaneous Once Plotnikov, Aleksei V, MD        Review of Systems     Objective:  There were no vitals filed for this visit. BP Readings from Last 3 Encounters:  07/21/23 132/80  07/17/23 130/71  05/15/23 120/70   Wt Readings from Last 3 Encounters:  07/21/23 119 lb (54 kg)  07/17/23 122 lb 3.2 oz (55.4 kg)  05/15/23 120 lb (54.4 kg)   There is no height or weight on file to calculate BMI.    Physical Exam         Assessment & Plan:    See Problem List for Assessment and Plan of chronic medical problems.

## 2023-10-14 ENCOUNTER — Ambulatory Visit: Admitting: Psychology

## 2023-10-14 ENCOUNTER — Ambulatory Visit (INDEPENDENT_AMBULATORY_CARE_PROVIDER_SITE_OTHER): Admitting: Internal Medicine

## 2023-10-14 VITALS — BP 130/74 | HR 66 | Temp 98.0°F | Ht 61.0 in | Wt 113.0 lb

## 2023-10-14 DIAGNOSIS — S8011XS Contusion of right lower leg, sequela: Secondary | ICD-10-CM

## 2023-10-14 DIAGNOSIS — S8011XD Contusion of right lower leg, subsequent encounter: Secondary | ICD-10-CM

## 2023-10-14 NOTE — Patient Instructions (Addendum)
     You do have residual hematoma in your right leg - this may or may not go away.

## 2023-11-05 ENCOUNTER — Ambulatory Visit

## 2023-11-05 VITALS — Ht 61.0 in | Wt 123.0 lb

## 2023-11-05 DIAGNOSIS — Z0000 Encounter for general adult medical examination without abnormal findings: Secondary | ICD-10-CM

## 2023-11-05 NOTE — Progress Notes (Cosign Needed Addendum)
 Subjective:   Maitlyn Penza is a 83 y.o. who presents for a Medicare Wellness preventive visit.  As a reminder, Annual Wellness Visits don't include a physical exam, and some assessments may be limited, especially if this visit is performed virtually. We may recommend an in-person follow-up visit with your provider if needed.  Visit Complete: Virtual I connected with  Rock Alexa Platts on 11/05/23 by a audio enabled telemedicine application and verified that I am speaking with the correct person using two identifiers.  Patient Location: Home  Provider Location: Office/Clinic  I discussed the limitations of evaluation and management by telemedicine. The patient expressed understanding and agreed to proceed.  Vital Signs: Because this visit was a virtual/telehealth visit, some criteria may be missing or patient reported. Any vitals not documented were not able to be obtained and vitals that have been documented are patient reported.  VideoDeclined- This patient declined Librarian, academic. Therefore the visit was completed with audio only.  Persons Participating in Visit: Patient.  AWV Questionnaire: No: Patient Medicare AWV questionnaire was not completed prior to this visit.  Cardiac Risk Factors include: advanced age (>48men, >78 women)     Objective:    Today's Vitals   11/05/23 0853  Weight: 123 lb (55.8 kg)  Height: 5' 1 (1.549 m)   Body mass index is 23.24 kg/m.     11/05/2023    8:54 AM 10/15/2022    9:54 AM 06/26/2021   11:35 AM 05/31/2020    3:02 PM 05/30/2020    2:14 PM 05/13/2019    8:08 AM 02/26/2019    7:18 AM  Advanced Directives  Does Patient Have a Medical Advance Directive? Yes Yes Yes Yes Yes Yes No  Type of Estate agent of [place];Living will Healthcare Power of [place];Living will Living will;Healthcare Power of [employer] Power of [place];Living will Healthcare Power of [place];Living  will    Does patient want to make changes to medical advance directive?   No - Patient declined No - Patient declined     Copy of Healthcare Power of Attorney in Chart? No - copy requested No - copy requested No - copy requested No - copy requested No - copy requested    Would patient like information on creating a medical advance directive?       No - Patient declined    Current Medications (verified) Outpatient Encounter Medications as of 11/05/2023  Medication Sig   allopurinol  (ZYLOPRIM ) 100 MG tablet Take 2 tablets (200 mg total) by mouth daily.   apixaban  (ELIQUIS ) 2.5 MG TABS tablet Take 1 tablet (2.5 mg total) by mouth 2 (two) times daily.   Bromfenac  Sodium (PROLENSA ) 0.07 % SOLN Place 1 drop into the left eye daily.   Bromfenac  Sodium 0.07 % SOLN Place 1 drop into the left eye daily.   budesonide  (RHINOCORT  AQUA) 32 MCG/ACT nasal spray Place 1 spray into both nostrils in the morning and at bedtime. (Patient taking differently: Place 1 spray into both nostrils in the morning and at bedtime. Left nostril)   calcium  carbonate (OS-CAL) 600 MG TABS tablet Take 600 mg by mouth daily.   Cholecalciferol  (VITAMIN D -1000 MAX ST) 25 MCG (1000 UT) tablet Take 1,000 Units by mouth daily. Patient taking twice a month   denosumab  (PROLIA ) 60 MG/ML SOLN injection Inject 60 mg into the skin every 6 (six) months. Administer in upper arm, thigh, or abdomen   Difluprednate 0.05 % EMUL Place 1 drop into  the left eye 2 (two) times daily.   famotidine  (PEPCID ) 20 MG tablet Take 20 mg by mouth at bedtime.    LORazepam  (ATIVAN ) 0.5 MG tablet TAKE ONE TABLET BY MOUTH ONCE DAILY AS NEEDED FOR ANXIETY   losartan (COZAAR) 25 MG tablet Take 12.5 mg by mouth daily.   Multiple Vitamin (MULTIVITAMIN) tablet Take 1 tablet by mouth every morning.   neomycin-polymyxin b-dexamethasone  (MAXITROL) 3.5-10000-0.1 OINT Place 1 Application into both eyes 3 (three) times daily.   Facility-Administered Encounter Medications as  of 11/05/2023  Medication   [START ON 01/21/2024] denosumab  (PROLIA ) injection 60 mg    Allergies (verified) 3,5-diiodothyronine; Codeine; Dilaudid  [hydromorphone  hcl]; Latrix xm [urea  in zn undecyl-lactic acid]; Pedi-pre tape spray [wound dressing adhesive]; Sulfa antibiotics; Sulfacetamide; Sulfonamide derivatives; Tape; Hydrocodone -acetaminophen ; and Norco [hydrocodone -acetaminophen ]   History: Past Medical History:  Diagnosis Date   Anxiety    Complication of anesthesia    Congestion of both ears    Deviated nasal septum    Diverticulitis    DVT (deep venous thrombosis) (HCC)    Gallstones    GERD (gastroesophageal reflux disease)    Hair loss    Hematuria    in past   History of creation of ostomy (HCC)    past partial colectomy in 05/03/14   History of diverticulitis    History of kidney stones    History of skin cancer    Kidney cysts    Migraines    in past   Molar pregnancy    Osteoarthritis    fingers    Osteoporosis    Osteoporosis    Persistent cough    Pneumonia 2019   PONV (postoperative nausea and vomiting)    sensitive to medications   Skin cancer    Unspecified vitamin D  deficiency    Ventral hernia    Past Surgical History:  Procedure Laterality Date   ABDOMINAL HYSTERECTOMY  1972   CYSTOSCOPY WITH RETROGRADE PYELOGRAM, URETEROSCOPY AND STENT PLACEMENT Left 05/21/2019   Procedure: CYSTOSCOPY WITH RETROGRADE PYELOGRAM, URETEROSCOPY AND STENT PLACEMENT;  Surgeon: Alvaro Hummer, MD;  Location: [facility];  Service: Urology;  Laterality: Left;  1 HR   CYSTOSCOPY WITH RETROGRADE PYELOGRAM, URETEROSCOPY AND STENT PLACEMENT Left 05/31/2020   Procedure: CYSTOSCOPY WITH RETROGRADE PYELOGRAM, URETEROSCOPY, BASKET EXTRACTION AND STENT PLACEMENT;  Surgeon: Alvaro Hummer, MD;  [facility]: [facility];  Service: Urology;  Laterality: Left;  1 HR   CYSTOSCOPY/URETEROSCOPY/HOLMIUM LASER/STENT PLACEMENT Left 02/26/2019   Procedure: CYSTOSCOPY/URETEROSCOPY/HOLMIUM LASER/STENT  PLACEMENT FIRST STAGE;  Surgeon: Alvaro Hummer, MD;  [facility];  Service: Urology;  Laterality: Left;  1 HR   EYE SURGERY     FACIAL COSMETIC SURGERY  2009   Beula Brown MD in [facility]   HOLMIUM LASER APPLICATION Left 05/21/2019   Procedure: HOLMIUM LASER APPLICATION;  Surgeon: Alvaro Hummer, MD;  [facility];  Service: Urology;  Laterality: Left;   HOLMIUM LASER APPLICATION Left 05/31/2020   Procedure: HOLMIUM LASER APPLICATION;  Surgeon: Alvaro Hummer, MD;  [facility];  Service: Urology;  Laterality: Left;   ILEO LOOP COLOSTOMY CLOSURE N/A 11/08/2014   Procedure: LAPAROSCOPIC COLOSTOMY CLOSURE WITH PARTIAL COLECTOMY;  Surgeon: Krystal Russell, MD;  [facility];  Service: General;  Laterality: N/A;   INSERTION OF MESH N/A 08/01/2015   Procedure: INSERTION OF MESH;  Surgeon: Krystal Russell, MD;  [facility];  Service: General;  Laterality: N/A;   LAPAROSCOPIC LYSIS OF ADHESIONS N/A 11/08/2014   Procedure: LAPAROSCOPIC LYSIS OF ADHESIONS FOR 90  MINUTES;  Surgeon: Krystal Russell, MD;  [facility];  Service: General;  Laterality: N/A;   LAPAROSCOPIC LYSIS OF ADHESIONS N/A 08/01/2015   Procedure: LAPAROSCOPIC LYSIS OF ADHESIONS;  Surgeon: Krystal Russell, MD;  [facility];  Service: General;  Laterality: N/A;   LAPAROSCOPIC PARTIAL COLECTOMY N/A 05/03/2014   Procedure: DIAGNOSTIC LAPAROSCOPY WITH DRAINAGE OF INTRA ABDOMINAL ABSCESS PARTIAL COLECTOMY ;  Surgeon: Krystal Russell, MD;  [facility];  Service: General;  Laterality: N/A;  supine position   LAPAROTOMY N/A 05/03/2014   Procedure: EXPLORATORY LAPAROTOMY WITH HARTMAN PROCEDURE;  Surgeon: Krystal Russell, MD;  [facility];  Service: General;  Laterality: N/A;   LIPOMA RESECTION     left scapula area   OOPHORECTOMY  1976   REDUCTION MAMMAPLASTY  1994   RHINOPLASTY  1985   TONSILLECTOMY     VENTRAL HERNIA REPAIR N/A 08/01/2015   Procedure: LAPAROSCOPIC VENTRAL INCISIONAL  HERNIA;  Surgeon: Krystal Russell, MD;  [facility];  Service: General;  Laterality: N/A;   Family History  Problem Relation Age of Onset   Arthritis Mother    Colon cancer Mother 50   Heart disease Father    Dementia Father    Diabetes Sister    Cancer Maternal Grandfather        Lung cancer   Heart disease Maternal Grandmother    Stomach cancer Neg Hx    Social History   Socioeconomic History   Marital status: Married    Spouse name: [name]   Number of children: 2   Years of education: Not on file   Highest education level: Not on file  Occupational History   Occupation: Retired  Tobacco Use   Smoking status: Never   Smokeless tobacco: Never  Vaping Use   Vaping status: Never Used  Substance and Sexual Activity   Alcohol use: No    Alcohol/week: 0.0 standard drinks of alcohol   Drug use: No   Sexual activity: Not Currently    Comment: 1st intercourse 16 yo, 5 partners, no std history, DES unkown  Other Topics Concern   Not on file  Social History Narrative   [employer] - education   Married '64   Work: Runner, broadcasting/film/video, 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[place], retired   2 adopted sons '69, '71   Marriage in good health- Life is good   Social Drivers of Corporate investment banker Strain: Low Risk  (11/05/2023)   Overall Financial Resource Strain (CARDIA)    Difficulty of Paying Living Expenses: Not hard at all  Food Insecurity: No Food Insecurity (11/05/2023)   Hunger Vital Sign    Worried About Running Out of Food in the Last Year: Never true    Ran Out of Food in the Last Year: Never true  Transportation Needs: No Transportation Needs (11/05/2023)   PRAPARE - Administrator, Civil Service (Medical): No    Lack of Transportation (Non-Medical): No  Physical Activity: Unknown (11/05/2023)   Exercise Vital Sign    Days of Exercise per Week: Not on file    Minutes of Exercise per Session: 120 min  Stress: No Stress Concern Present (11/05/2023)   [employer]  of Occupational Health - Occupational Stress Questionnaire    Feeling of Stress: Not at all  Social Connections: Socially Integrated (11/05/2023)   Social Connection and Isolation Panel    Frequency of Communication with Friends and Family: More than three times a week  Frequency of Social Gatherings with Friends and Family: Twice a week    Attends Religious Services: More than 4 times per year    Active Member of [employer] or Organizations: Yes    Attends Engineer, structural: More than 4 times per year    Marital Status: Married    Tobacco Counseling Counseling given: Not Answered    Clinical Intake:  Pre-visit preparation completed: Yes  Pain : No/denies pain     BMI - recorded: 23.24 Nutritional Status: BMI of 19-24  Normal Nutritional Risks: None Diabetes: No  Lab Results  Component Value Date   HGBA1C 6.2 04/18/2022   HGBA1C 6.0 (H) 11/03/2014   HGBA1C 6.0 (H) 04/29/2014     How often do you need to have someone help you when you read instructions, pamphlets, or other written materials from your doctor or pharmacy?: 1 - Never  Interpreter Needed?: No  Information entered by :: Verdie Saba, CMA   Activities of Daily Living     11/05/2023    8:58 AM  In your present state of health, do you have any difficulty performing the following activities:  Hearing? 0  Comment wears hearing aids  Vision? 0  Difficulty concentrating or making decisions? 0  Walking or climbing stairs? 0  Dressing or bathing? 0  Doing errands, shopping? 0  Preparing Food and eating ? N  Using the Toilet? N  In the past six months, have you accidently leaked urine? N  Do you have problems with loss of bowel control? N  Managing your Medications? N  Managing your Finances? N  Housekeeping or managing your Housekeeping? N    Patient Care Team: Plotnikov, Karlynn GAILS, MD as PCP - General (Internal Medicine) Altheimer, Ozell, MD (Endocrinology) Lily Boas, MD as  Consulting Physician (General Surgery) Pyrtle, Gordy HERO, [name] Patrcia Ozell, MD as Consulting Physician (Ophthalmology) Shelah Lamar RAMAN, MD as Consulting Physician (Pulmonary Disease) Coni Alm CROME, PhD as Consulting Physician (Psychology) Alvaro Ricardo KATHEE Raddle., MD as Consulting Physician (Urology) Arnaldo Purchase, MD (Inactive) as Consulting Physician (Obstetrics and Gynecology) Signe Mitzie LABOR, MD as Consulting Physician (General Surgery) Cathlyn JAYSON Nikki Bobie FORBES, MD as Consulting Physician (Obstetrics and Gynecology) Raj Rankin SAUNDERS, MD as Referring Physician (Ophthalmology)  I have updated your Care Teams any recent Medical Services you may have received from other providers in the past year.     Assessment:   This is a routine wellness examination for Kristen Castillo.  Hearing/Vision screen Hearing Screening - Comments:: Wears hearing aids Vision Screening - Comments:: Wears rx glasses - up to date with routine eye exams with Dr Patrcia   Goals Addressed               This Visit's Progress     Patient Stated (pt-stated)        Patient stated she plans to continue walking (exercising) and watching diet        Depression Screen     11/05/2023    9:00 AM 10/14/2023    3:47 PM 05/15/2023    8:18 AM 10/15/2022    9:58 AM 04/18/2022    9:24 AM 01/23/2022    8:12 AM 06/26/2021   11:39 AM  PHQ 2/9 Scores  PHQ - 2 Score 0 0 0 0 0 0 0  PHQ- 9 Score 0   0       Fall Risk     11/05/2023    8:59 AM 10/14/2023  3:47 PM 07/17/2023   10:21 AM 05/15/2023    8:18 AM 10/14/2022    8:18 PM  Fall Risk   Falls in the past year? 0 0 0 0 1  Number falls in past yr: 0 0  0 0  Injury with Fall? 0 0  0 1  Risk for fall due to : No Fall Risks No Fall Risks  No Fall Risks   Follow up Falls evaluation completed;Falls prevention discussed Falls evaluation completed  Falls evaluation completed     MEDICARE RISK AT HOME:  Medicare Risk at Home Any stairs  in or around the home?: Yes If so, are there any without handrails?: Yes Home free of loose throw rugs in walkways, pet beds, electrical cords, etc?: Yes Adequate lighting in your home to reduce risk of falls?: Yes Life alert?: No Use of a cane, walker or w/c?: No Grab bars in the bathroom?: Yes Shower chair or bench in shower?: No Elevated toilet seat or a handicapped toilet?: Yes  TIMED UP AND GO:  Was the test performed?  No  Cognitive Function: 6CIT completed        11/05/2023    9:01 AM 10/15/2022    9:55 AM 06/26/2021   11:49 AM  6CIT Screen  What Year? 0 points 0 points 0 points  What month? 0 points 0 points 0 points  What time? 0 points 0 points 0 points  Count back from 20 0 points 0 points 0 points  Months in reverse 0 points 0 points 0 points  Repeat phrase 0 points 0 points 0 points  Total Score 0 points 0 points 0 points    Immunizations Immunization History  Administered Date(s) Administered   Fluad Quad(high Dose 65+) 11/03/2018, 12/10/2019, 12/29/2020, 01/04/2022   H1N1 04/16/2008   INFLUENZA, HIGH DOSE SEASONAL PF 12/26/2015, 01/01/2017   Influenza Whole 12/25/2009, 12/09/2011   Influenza,inj,Quad PF,6+ Mos 11/25/2014, 12/04/2017   Influenza-Unspecified 03/01/2014   Moderna Covid-19 Vaccine Bivalent Booster 58yrs & up 12/08/2020   Moderna SARS-COV2 Booster Vaccination 07/11/2020   Moderna Sars-Covid-2 Vaccination 03/20/2019, 04/17/2019, 12/29/2019   Pfizer Covid-19 Vaccine Bivalent Booster 62yrs & up 12/26/2021   Pneumococcal Conjugate-13 04/14/2013   Pneumococcal Polysaccharide-23 03/11/2002, 12/25/2009, 09/16/2017   Td 02/09/2009   Tdap 08/26/2019   Zoster Recombinant(Shingrix) 05/20/2018, 08/26/2018   Zoster, Live 02/17/2009    Screening Tests Health Maintenance  Topic Date Due   COVID-19 Vaccine (6 - 2024-25 season) 11/10/2022   INFLUENZA VACCINE  10/10/2023   Medicare Annual Wellness (AWV)  11/04/2024   DTaP/Tdap/Td (3 - Td or Tdap)  08/25/2029   Pneumococcal Vaccine: 50+ Years  Completed   DEXA SCAN  Completed   Zoster Vaccines- Shingrix  Completed   HPV VACCINES  Aged Out   Meningococcal B Vaccine  Aged Out    Health Maintenance  Health Maintenance Due  Topic Date Due   COVID-19 Vaccine (6 - 2024-25 season) 11/10/2022   INFLUENZA VACCINE  10/10/2023   Health Maintenance Items Addressed:  11/05/2023  Additional Screening:  Vision Screening: Recommended annual ophthalmology exams for early detection of glaucoma and other disorders of the eye. Would you like a referral to an eye doctor? No    Dental Screening: Recommended annual dental exams for proper oral hygiene  Community Resource Referral / Chronic Care Management: CRR required this visit?  No   CCM required this visit?  No   Plan:    I have personally reviewed and noted the following in the  patient's chart:   Medical and social history Use of alcohol, tobacco or illicit drugs  Current medications and supplements including opioid prescriptions. Patient is not currently taking opioid prescriptions. Functional ability and status Nutritional status Physical activity Advanced directives List of other physicians Hospitalizations, surgeries, and ER visits in previous 12 months Vitals Screenings to include cognitive, depression, and falls Referrals and appointments  In addition, I have reviewed and discussed with patient certain preventive protocols, quality metrics, and best practice recommendations. A written personalized care plan for preventive services as well as general preventive health recommendations were provided to patient.   Verdie CHRISTELLA Saba, CMA   11/05/2023   After Visit Summary: (MyChart) Due to this being a telephonic visit, the after visit summary with patients personalized plan was offered to patient via MyChart   Notes: Nothing significant to report at this time.  Medical screening examination/treatment/procedure(s) were  performed by non-physician practitioner and as supervising physician I was immediately available for consultation/collaboration.  I agree with above. Karlynn Noel, MD

## 2023-11-05 NOTE — Patient Instructions (Signed)
 Kristen Castillo , Thank you for taking time out of your busy schedule to complete your Annual Wellness Visit with me. I enjoyed our conversation and look forward to speaking with you again next year. I, as well as your care team,  appreciate your ongoing commitment to your health goals. Please review the following plan we discussed and let me know if I can assist you in the future. Your Game plan/ To Do List    Referrals: If you haven't heard from the office you've been referred to, please reach out to them at the phone provided.   Follow up Visits: We will see or speak with you next year for your Next Medicare AWV with our clinical staff Have you seen your provider in the last 6 months (3 months if uncontrolled diabetes)? No  Clinician Recommendations:  Aim for 30 minutes of exercise or brisk walking, 6-8 glasses of water, and 5 servings of fruits and vegetables each day.       This is a list of the screenings recommended for you:  Health Maintenance  Topic Date Due   COVID-19 Vaccine (6 - 2024-25 season) 11/10/2022   Flu Shot  10/10/2023   Medicare Annual Wellness Visit  11/04/2024   DTaP/Tdap/Td vaccine (3 - Td or Tdap) 08/25/2029   Pneumococcal Vaccine for age over 42  Completed   DEXA scan (bone density measurement)  Completed   Zoster (Shingles) Vaccine  Completed   HPV Vaccine  Aged Out   Meningitis B Vaccine  Aged Out    Advanced directives: (Copy Requested) Please bring a copy of your health care power of attorney and living will to the office to be added to your chart at your convenience. You can mail to [facility] [address] [place], [place] [address] or email to [email or web address] Advance Care Planning is important because it:  [x]  Makes sure you receive the medical care that is consistent with your values, goals, and preferences  [x]  It provides guidance to your family and loved ones and reduces their decisional burden about whether or not they  are making the right decisions based on your wishes.  Follow the link provided in your after visit summary or read over the paperwork we have mailed to you to help you started getting your Advance Directives in place. If you need assistance in completing these, please reach out to us  so that we can help you!

## 2023-11-06 ENCOUNTER — Ambulatory Visit

## 2023-11-06 DIAGNOSIS — K080 Exfoliation of teeth due to systemic causes: Secondary | ICD-10-CM | POA: Diagnosis not present

## 2023-11-17 ENCOUNTER — Ambulatory Visit: Admitting: Internal Medicine

## 2023-11-18 DIAGNOSIS — L821 Other seborrheic keratosis: Secondary | ICD-10-CM | POA: Diagnosis not present

## 2023-11-18 DIAGNOSIS — L570 Actinic keratosis: Secondary | ICD-10-CM | POA: Diagnosis not present

## 2023-11-18 DIAGNOSIS — D692 Other nonthrombocytopenic purpura: Secondary | ICD-10-CM | POA: Diagnosis not present

## 2023-11-18 DIAGNOSIS — L218 Other seborrheic dermatitis: Secondary | ICD-10-CM | POA: Diagnosis not present

## 2023-11-18 DIAGNOSIS — Z85828 Personal history of other malignant neoplasm of skin: Secondary | ICD-10-CM | POA: Diagnosis not present

## 2023-11-24 ENCOUNTER — Encounter: Payer: Self-pay | Admitting: Internal Medicine

## 2023-11-24 ENCOUNTER — Ambulatory Visit: Admitting: Internal Medicine

## 2023-11-24 VITALS — BP 128/72 | HR 73 | Temp 97.7°F | Ht 61.0 in | Wt 123.8 lb

## 2023-11-24 DIAGNOSIS — L219 Seborrheic dermatitis, unspecified: Secondary | ICD-10-CM

## 2023-11-24 DIAGNOSIS — N1832 Chronic kidney disease, stage 3b: Secondary | ICD-10-CM

## 2023-11-24 DIAGNOSIS — B001 Herpesviral vesicular dermatitis: Secondary | ICD-10-CM | POA: Diagnosis not present

## 2023-11-24 DIAGNOSIS — L719 Rosacea, unspecified: Secondary | ICD-10-CM | POA: Diagnosis not present

## 2023-11-24 DIAGNOSIS — M810 Age-related osteoporosis without current pathological fracture: Secondary | ICD-10-CM

## 2023-11-24 DIAGNOSIS — Z23 Encounter for immunization: Secondary | ICD-10-CM | POA: Diagnosis not present

## 2023-11-24 DIAGNOSIS — E559 Vitamin D deficiency, unspecified: Secondary | ICD-10-CM | POA: Diagnosis not present

## 2023-11-24 MED ORDER — VALACYCLOVIR HCL 500 MG PO TABS: 500.0000 mg | ORAL_TABLET | Freq: Two times a day (BID) | ORAL | 3 refills | Status: AC

## 2023-11-24 NOTE — Assessment & Plan Note (Signed)
 Appt w/Dr Jerrye is pending

## 2023-11-24 NOTE — Assessment & Plan Note (Signed)
 On Vit D

## 2023-11-24 NOTE — Assessment & Plan Note (Signed)
 On prolia  Core muscle exercises Vit D every 2 weeks Add Vit K2 - decided not to take it

## 2023-11-24 NOTE — Patient Instructions (Signed)
  Hydrocort cream

## 2023-11-24 NOTE — Assessment & Plan Note (Signed)
 Face: Aquaphor prn, Cortaid cream prn

## 2023-11-24 NOTE — Progress Notes (Signed)
 Subjective:  Patient ID: Kristen Castillo, female    DOB: Oct 09, 1940  Age: 83 y.o. MRN: [id number]  CC: Follow-up (Patient wants Flu shot. States she has a hematoma that hasn't quite disappeared on leg. Strange thing on left side of her face. Patient needs to be scheduled for Prolia  injection. )   HPI Kristen Castillo presents for L face bruise, rash. H labialis  Outpatient Medications Prior to Visit  Medication Sig Dispense Refill   allopurinol  (ZYLOPRIM ) 100 MG tablet Take 2 tablets (200 mg total) by mouth daily. 180 tablet 1   apixaban  (ELIQUIS ) 2.5 MG TABS tablet Take 1 tablet (2.5 mg total) by mouth 2 (two) times daily. 180 tablet 1   Bromfenac  Sodium (PROLENSA ) 0.07 % SOLN Place 1 drop into the left eye daily.     Bromfenac  Sodium 0.07 % SOLN Place 1 drop into the left eye daily. 3 mL 4   budesonide  (RHINOCORT  AQUA) 32 MCG/ACT nasal spray Place 1 spray into both nostrils in the morning and at bedtime. (Patient taking differently: Place 1 spray into both nostrils in the morning and at bedtime. Left nostril) 8 mL 5   Cholecalciferol  (VITAMIN D -1000 MAX ST) 25 MCG (1000 UT) tablet Take 1,000 Units by mouth daily. Patient taking twice a month     denosumab  (PROLIA ) 60 MG/ML SOLN injection Inject 60 mg into the skin every 6 (six) months. Administer in upper arm, thigh, or abdomen     Difluprednate 0.05 % EMUL Place 1 drop into the left eye 2 (two) times daily.     famotidine  (PEPCID ) 20 MG tablet Take 20 mg by mouth at bedtime.      LORazepam  (ATIVAN ) 0.5 MG tablet TAKE ONE TABLET BY MOUTH ONCE DAILY AS NEEDED FOR ANXIETY 90 tablet 1   losartan (COZAAR) 25 MG tablet Take 12.5 mg by mouth daily.     calcium  carbonate (OS-CAL) 600 MG TABS tablet Take 600 mg by mouth daily.     Multiple Vitamin (MULTIVITAMIN) tablet Take 1 tablet by mouth every morning. (Patient not taking: Reported on 11/24/2023)     neomycin-polymyxin b-dexamethasone  (MAXITROL) 3.5-10000-0.1 OINT Place 1 Application  into both eyes 3 (three) times daily. (Patient not taking: Reported on 11/24/2023)     Facility-Administered Medications Prior to Visit  Medication Dose Route Frequency Provider Last Rate Last Admin   [START ON 01/21/2024] denosumab  (PROLIA ) injection 60 mg  60 mg Subcutaneous Once Azayla Polo V, MD        ROS: Review of Systems  Constitutional:  Negative for activity change, appetite change, chills, fatigue and unexpected weight change.  HENT:  Negative for congestion, mouth sores and sinus pressure.   Eyes:  Negative for visual disturbance.  Respiratory:  Negative for cough and chest tightness.   Gastrointestinal:  Negative for abdominal pain and nausea.  Genitourinary:  Negative for difficulty urinating, frequency and vaginal pain.  Musculoskeletal:  Negative for back pain and gait problem.  Skin:  Negative for pallor and rash.  Neurological:  Negative for dizziness, tremors, weakness, numbness and headaches.  Hematological:  Bruises/bleeds easily.  Psychiatric/Behavioral:  Negative for confusion, self-injury, sleep disturbance and suicidal ideas.     Objective:  BP 128/72   Pulse 73   Temp 97.7 F (36.5 C) (Oral)   Ht 5' 1 (1.549 m)   Wt 123 lb 12.8 oz (56.2 kg)   SpO2 94%   BMI 23.39 kg/m   BP Readings from Last 3 Encounters:  11/24/23 128/72  10/14/23 130/74  07/21/23 132/80    Wt Readings from Last 3 Encounters:  11/24/23 123 lb 12.8 oz (56.2 kg)  11/05/23 123 lb (55.8 kg)  10/14/23 113 lb (51.3 kg)    Physical Exam Constitutional:      General: She is not in acute distress.    Appearance: She is well-developed.  HENT:     Head: Normocephalic.     Right Ear: External ear normal.     Left Ear: External ear normal.     Nose: Nose normal.  Eyes:     General:        Right eye: No discharge.        Left eye: No discharge.     Conjunctiva/sclera: Conjunctivae normal.     Pupils: Pupils are equal, round, and reactive to light.  Neck:     Thyroid : No  thyromegaly.     Vascular: No JVD.     Trachea: No tracheal deviation.  Cardiovascular:     Rate and Rhythm: Normal rate and regular rhythm.     Heart sounds: Normal heart sounds.  Pulmonary:     Effort: No respiratory distress.     Breath sounds: No stridor. No wheezing.  Abdominal:     General: Bowel sounds are normal. There is no distension.     Palpations: Abdomen is soft. There is no mass.     Tenderness: There is no abdominal tenderness. There is no guarding or rebound.  Musculoskeletal:        General: No tenderness.     Cervical back: Normal range of motion and neck supple. No rigidity.  Lymphadenopathy:     Cervical: No cervical adenopathy.  Skin:    Findings: No erythema or rash.  Neurological:     Cranial Nerves: No cranial nerve deficit.     Motor: No abnormal muscle tone.     Coordination: Coordination normal.     Deep Tendon Reflexes: Reflexes normal.  Psychiatric:        Behavior: Behavior normal.        Thought Content: Thought content normal.        Judgment: Judgment normal.   Small ulcer on the L lower lip Dry skin over L eyebrow, L cheek  Lab Results  Component Value Date   WBC 8.3 04/18/2022   HGB 13.4 04/18/2022   HCT 40.3 04/18/2022   PLT 342.0 04/18/2022   GLUCOSE 109 (H) 04/18/2022   CHOL 255 (H) 01/17/2021   TRIG 231.0 (H) 01/17/2021   HDL 69.10 01/17/2021   LDLDIRECT 135.0 01/17/2021   LDLCALC 117 (H) 11/03/2018   ALT 10 04/18/2022   AST 16 04/18/2022   NA 140 04/18/2022   K 4.0 04/18/2022   CL 105 04/18/2022   CREATININE 1.22 (H) 04/18/2022   BUN 34 (H) 04/18/2022   CO2 24 04/18/2022   TSH 4.99 01/17/2021   INR 1.3 (H) 05/11/2019   HGBA1C 6.2 04/18/2022    MM 3D SCREENING MAMMOGRAM BILATERAL BREAST Result Date: 08/26/2023 CLINICAL DATA:  Screening. EXAM: DIGITAL SCREENING BILATERAL MAMMOGRAM WITH TOMOSYNTHESIS AND CAD TECHNIQUE: Bilateral screening digital craniocaudal and mediolateral oblique mammograms were obtained. Bilateral  screening digital breast tomosynthesis was performed. The images were evaluated with computer-aided detection. COMPARISON:  Previous exam(s). ACR Breast Density Category b: There are scattered areas of fibroglandular density. FINDINGS: There are no findings suspicious for malignancy. IMPRESSION: No mammographic evidence of malignancy. A result letter of this screening mammogram will be mailed directly to the patient.  RECOMMENDATION: Screening mammogram in one year. (Code:SM-B-01Y) BI-RADS CATEGORY  1: Negative. Electronically Signed   By: Toribio Agreste M.D.   On: 08/26/2023 12:13    Assessment & Plan:   Problem List Items Addressed This Visit     Cold sore - Primary   Lower lip q 12 mo or so Valtrex  prn x 5 d      Relevant Medications   valACYclovir  (VALTREX ) 500 MG tablet   CRI (chronic renal insufficiency)   Appt w/Dr Jerrye is pending      Osteoporosis   On prolia  Core muscle exercises Vit D every 2 weeks Add Vit K2 - decided not to take it      Rosacea   Seborrheic dermatitis   Face: Aquaphor prn, Cortaid cream prn      Vitamin D  deficiency   On Vit D      Other Visit Diagnoses       Immunization due       Relevant Orders   Flu vaccine HIGH DOSE PF(Fluzone Trivalent) (Completed)         Meds ordered this encounter  Medications   valACYclovir  (VALTREX ) 500 MG tablet    Sig: Take 1 tablet (500 mg total) by mouth 2 (two) times daily.    Dispense:  10 tablet    Refill:  3      Follow-up: Return in about 3 months (around 02/23/2024) for a follow-up visit.  Marolyn Noel, MD

## 2023-11-24 NOTE — Assessment & Plan Note (Signed)
 Lower lip q 12 mo or so Valtrex  prn x 5 d

## 2023-12-01 DIAGNOSIS — N1832 Chronic kidney disease, stage 3b: Secondary | ICD-10-CM | POA: Diagnosis not present

## 2023-12-04 DIAGNOSIS — H35352 Cystoid macular degeneration, left eye: Secondary | ICD-10-CM | POA: Diagnosis not present

## 2023-12-04 DIAGNOSIS — H43391 Other vitreous opacities, right eye: Secondary | ICD-10-CM | POA: Diagnosis not present

## 2023-12-04 DIAGNOSIS — H35371 Puckering of macula, right eye: Secondary | ICD-10-CM | POA: Diagnosis not present

## 2023-12-04 DIAGNOSIS — H348322 Tributary (branch) retinal vein occlusion, left eye, stable: Secondary | ICD-10-CM | POA: Diagnosis not present

## 2023-12-12 ENCOUNTER — Other Ambulatory Visit: Payer: Self-pay | Admitting: Family

## 2023-12-12 DIAGNOSIS — R809 Proteinuria, unspecified: Secondary | ICD-10-CM | POA: Diagnosis not present

## 2023-12-12 DIAGNOSIS — I129 Hypertensive chronic kidney disease with stage 1 through stage 4 chronic kidney disease, or unspecified chronic kidney disease: Secondary | ICD-10-CM | POA: Diagnosis not present

## 2023-12-12 DIAGNOSIS — N200 Calculus of kidney: Secondary | ICD-10-CM | POA: Diagnosis not present

## 2023-12-12 DIAGNOSIS — N1832 Chronic kidney disease, stage 3b: Secondary | ICD-10-CM | POA: Diagnosis not present

## 2023-12-16 ENCOUNTER — Encounter: Payer: Self-pay | Admitting: Internal Medicine

## 2023-12-19 DIAGNOSIS — B078 Other viral warts: Secondary | ICD-10-CM | POA: Diagnosis not present

## 2023-12-22 ENCOUNTER — Telehealth: Payer: Self-pay

## 2023-12-22 NOTE — Telephone Encounter (Signed)
 Prolia  VOB initiated via [email or web address]  Next Prolia  inj DUE: 01/21/24

## 2023-12-24 ENCOUNTER — Other Ambulatory Visit (HOSPITAL_COMMUNITY): Payer: Self-pay

## 2023-12-24 NOTE — Telephone Encounter (Signed)
 Pt ready for scheduling for PROLIA  on or after : 01/21/24  Option# 1: Buy/Bill (Office supplied medication)  Out-of-pocket cost due at time of clinic visit: $332  Number of injection/visits approved: 2  Primary: [employer] Prolia  co-insurance: 20% Admin fee co-insurance: 0%  Secondary: --- Prolia  co-insurance:  Admin fee co-insurance:   Medical Benefit Details: Date Benefits were checked: 12/22/23 Deductible: NO/ Coinsurance: 20%/ Admin Fee: 0%  Prior Auth: APPROVED PA# [id number]  Expiration Date: 12/24/23-12/23/24  # of doses approved: 2 ----------------------------------------------------------------------- Option# 2- Med Obtained from pharmacy:  Pharmacy benefit: Copay $174.19 (Paid to pharmacy) Admin Fee: 0% (Pay at clinic)  Prior Auth: APPROVED PA# [id number] Expiration Date: 07/01/23-06/30/24  # of doses approved: 2   If patient wants fill through the pharmacy benefit please send prescription to: [facility], and include estimated need by date in rx notes. Pharmacy will ship medication directly to the office.  Patient NOT eligible for Prolia  Copay Card. Copay Card can make patient's cost as little as $25. Link to apply: [email or web address]  ** This summary of benefits is an estimation of the patient's out-of-pocket cost. Exact cost may very based on individual plan coverage.

## 2023-12-24 NOTE — Telephone Encounter (Addendum)
 MEDICAL PA SUBMITTED VIA LATENT. KEY: [id number]   APPROVED. 12/24/23-12/23/24. PA#: [id number]   PHARMACY PA APPROVED. COPAY: $174.19

## 2023-12-24 NOTE — Telephone Encounter (Signed)
 Copied from CRM [id number]. Topic: General - Other >> Dec 24, 2023  2:29 PM Macario HERO wrote: Reason for CRM: Marty from [employer] calling to provide approval for prior authorization for Prolia .

## 2023-12-24 NOTE — Telephone Encounter (Signed)
 SABRA

## 2023-12-25 ENCOUNTER — Other Ambulatory Visit: Payer: Self-pay | Admitting: Internal Medicine

## 2023-12-25 NOTE — Telephone Encounter (Signed)
 Copied from CRM [id number]. Topic: Clinical - Medication Refill >> Dec 25, 2023  1:26 PM Ahlexyia S wrote: Medication: LORazepam  (ATIVAN ) 0.5 MG tablet  Has the patient contacted their pharmacy? Pharmacy called in prescription refill due to previous refills request not being sent. (Agent: If no, request that the patient contact the pharmacy for the refill. If patient does not wish to contact the pharmacy document the reason why and proceed with request.) (Agent: If yes, when and what did the pharmacy advise?)  This is the patient's preferred pharmacy:  [facility] [place], [place] - [address] [place] [address] Phone: [phone] Fax: [phone]  Is this the correct pharmacy for this prescription? Yes If no, delete pharmacy and type the correct one.   Has the prescription been filled recently? No  Is the patient out of the medication? Yes  Has the patient been seen for an appointment in the last year OR does the patient have an upcoming appointment? Yes  Can we respond through MyChart? Yes  Agent: Please be advised that Rx refills may take up to 3 business days. We ask that you follow-up with your pharmacy.

## 2023-12-26 MED ORDER — LORAZEPAM 0.5 MG PO TABS: ORAL_TABLET | ORAL | 1 refills | Status: AC

## 2024-01-05 ENCOUNTER — Encounter: Payer: Self-pay | Admitting: Internal Medicine

## 2024-01-12 ENCOUNTER — Other Ambulatory Visit: Payer: Self-pay | Admitting: Internal Medicine

## 2024-01-13 DIAGNOSIS — M7052 Other bursitis of knee, left knee: Secondary | ICD-10-CM | POA: Diagnosis not present

## 2024-01-15 DIAGNOSIS — H35352 Cystoid macular degeneration, left eye: Secondary | ICD-10-CM | POA: Diagnosis not present

## 2024-01-15 DIAGNOSIS — M7052 Other bursitis of knee, left knee: Secondary | ICD-10-CM | POA: Diagnosis not present

## 2024-01-15 DIAGNOSIS — H11421 Conjunctival edema, right eye: Secondary | ICD-10-CM | POA: Diagnosis not present

## 2024-01-21 ENCOUNTER — Encounter: Payer: Self-pay | Admitting: Internal Medicine

## 2024-01-21 DIAGNOSIS — H524 Presbyopia: Secondary | ICD-10-CM | POA: Diagnosis not present

## 2024-01-23 ENCOUNTER — Ambulatory Visit

## 2024-01-26 ENCOUNTER — Encounter: Payer: Self-pay | Admitting: Obstetrics and Gynecology

## 2024-01-26 NOTE — Progress Notes (Unsigned)
 83 y.o. [id number] Married Caucasian female here for a breast and pelvic exam. Wants to get lab work done. Had bleeding after bowel movements 3 times this year after a large bowel movement, has not had colonoscopy, hx colon cancer (mother), has previous colostomy bag.   She is followed for a hx of molar pregnancy.  She is status post TAH/BSO.  Her last hCG was 10 on 06/26/21. She had GYN ONC consultation with Dr. Viktoria on 09/03/21 who recommended that the hCG does not need to be checked further and that a level up to 14 is normal in postmenopausal women.  Denies vaginal bleeding or pelvic pain.   Does need to rush to get to bathroom on time.  No pad use.   Up 1 - 2 times a night to void.  No caffeine use.    Traveling to [place]  for Thanksgiving.    PCP: Plotnikov, Karlynn GAILS, MD  Nephrology:  Dr. Jerrye  No LMP recorded. Patient has had a hysterectomy.           Sexually active: No.  The current method of family planning is post menopausal status.    Menopausal hormone therapy:  n/a Exercising: No.  Was walking before knee injury.    Smoker:  no  OB History     Gravida  1   Para      Term      Preterm      AB  1   Living  0      SAB  1   IAB      Ectopic      Multiple      Live Births              HEALTH MAINTENANCE: Last 2 paps: 12/04/17 neg, 10/26/14 neg  History of abnormal Pap or positive HPV:  no Mammogram:  08/21/23 Breast Density cat B, BIRADS Cat 1 neg  Colonoscopy:  08/24/14  Bone Density:  01/27/23  Result  osteoporosis    Immunization History  Administered Date(s) Administered   Fluad Quad(high Dose 65+) 11/03/2018, 12/10/2019, 12/29/2020, 01/04/2022   H1N1 04/16/2008   INFLUENZA, HIGH DOSE SEASONAL PF 12/26/2015, 01/01/2017, 11/24/2023   Influenza Whole 12/25/2009, 12/09/2011   Influenza,inj,Quad PF,6+ Mos 11/25/2014, 12/04/2017   Influenza-Unspecified 03/01/2014   Moderna Covid-19 Vaccine Bivalent Booster 67yrs & up 12/08/2020    Moderna SARS-COV2 Booster Vaccination 07/11/2020   Moderna Sars-Covid-2 Vaccination 03/20/2019, 04/17/2019, 12/29/2019   Pfizer Covid-19 Vaccine Bivalent Booster 88yrs & up 12/26/2021   Pneumococcal Conjugate-13 04/14/2013   Pneumococcal Polysaccharide-23 03/11/2002, 12/25/2009, 09/16/2017   Td 02/09/2009   Tdap 08/26/2019   Zoster Recombinant(Shingrix) 05/20/2018, 08/26/2018   Zoster, Live 02/17/2009      reports that she has never smoked. She has never used smokeless tobacco. She reports that she does not drink alcohol and does not use drugs.  Past Medical History:  Diagnosis Date   Anxiety    Bursitis    Left knee   Complication of anesthesia    Congestion of both ears    Deviated nasal septum    Diverticulitis    DVT (deep venous thrombosis) (HCC)    Gallstones    GERD (gastroesophageal reflux disease)    Hair loss    Hematuria    in past   History of creation of ostomy (HCC)    past partial colectomy in 05/03/14   History of diverticulitis    History of kidney stones    History of skin  cancer    HSV infection    Kidney cysts    Migraines    in past   Molar pregnancy    Osteoarthritis    fingers    Osteoporosis    Osteoporosis    Persistent cough    Pneumonia 2019   PONV (postoperative nausea and vomiting)    sensitive to medications   Skin cancer    Unspecified vitamin D  deficiency    Ventral hernia     Past Surgical History:  Procedure Laterality Date   ABDOMINAL HYSTERECTOMY  1972   CYSTOSCOPY WITH RETROGRADE PYELOGRAM, URETEROSCOPY AND STENT PLACEMENT Left 05/21/2019   Procedure: CYSTOSCOPY WITH RETROGRADE PYELOGRAM, URETEROSCOPY AND STENT PLACEMENT;  Surgeon: Alvaro Hummer, MD;  Location: [facility];  Service: Urology;  Laterality: Left;  1 HR   CYSTOSCOPY WITH RETROGRADE PYELOGRAM, URETEROSCOPY AND STENT PLACEMENT Left 05/31/2020   Procedure: CYSTOSCOPY WITH RETROGRADE PYELOGRAM, URETEROSCOPY, BASKET EXTRACTION AND STENT PLACEMENT;  Surgeon: Alvaro Hummer, MD;  Location: [facility];  Service: Urology;  Laterality: Left;  1 HR   CYSTOSCOPY/URETEROSCOPY/HOLMIUM LASER/STENT PLACEMENT Left 02/26/2019   Procedure: CYSTOSCOPY/URETEROSCOPY/HOLMIUM LASER/STENT PLACEMENT FIRST STAGE;  Surgeon: Alvaro Hummer, MD;  Location: [facility];  Service: Urology;  Laterality: Left;  1 HR   EYE SURGERY     FACIAL COSMETIC SURGERY  2009   Beula Brown MD in [facility]   HOLMIUM LASER APPLICATION Left 05/21/2019   Procedure: HOLMIUM LASER APPLICATION;  Surgeon: Alvaro Hummer, MD;  Location: [facility];  Service: Urology;  Laterality: Left;   HOLMIUM LASER APPLICATION Left 05/31/2020   Procedure: HOLMIUM LASER APPLICATION;  Surgeon: Alvaro Hummer, MD;  Location: [facility];  Service: Urology;  Laterality: Left;   ILEO LOOP COLOSTOMY CLOSURE N/A 11/08/2014   Procedure: LAPAROSCOPIC COLOSTOMY CLOSURE WITH PARTIAL COLECTOMY;  Surgeon: Krystal Russell, MD;  Location: [facility];  Service: General;  Laterality: N/A;   INSERTION OF MESH N/A 08/01/2015   Procedure: INSERTION OF MESH;  Surgeon: Krystal Russell, MD;  Location: [facility];  Service: General;  Laterality: N/A;   LAPAROSCOPIC LYSIS OF ADHESIONS N/A 11/08/2014   Procedure: LAPAROSCOPIC LYSIS OF ADHESIONS FOR 90 MINUTES;  Surgeon: Krystal Russell, MD;  Location: [facility];  Service: General;  Laterality: N/A;   LAPAROSCOPIC LYSIS OF ADHESIONS N/A 08/01/2015   Procedure: LAPAROSCOPIC LYSIS OF ADHESIONS;  Surgeon: Krystal Russell, MD;  Location: [facility];  Service: General;  Laterality: N/A;   LAPAROSCOPIC PARTIAL COLECTOMY N/A 05/03/2014   Procedure: DIAGNOSTIC LAPAROSCOPY WITH DRAINAGE OF INTRA ABDOMINAL ABSCESS PARTIAL COLECTOMY ;  Surgeon: Krystal Russell, MD;  Location: [facility];  Service: General;  Laterality: N/A;  supine position   LAPAROTOMY N/A 05/03/2014   Procedure: EXPLORATORY LAPAROTOMY WITH HARTMAN PROCEDURE;  Surgeon: Krystal Russell, MD;  Location: [facility];  Service: General;  Laterality: N/A;   LIPOMA RESECTION     left scapula  area   OOPHORECTOMY  1976   REDUCTION MAMMAPLASTY  1994   RHINOPLASTY  1985   TONSILLECTOMY     VENTRAL HERNIA REPAIR N/A 08/01/2015   Procedure: LAPAROSCOPIC VENTRAL INCISIONAL  HERNIA;  Surgeon: Krystal Russell, MD;  Location: [facility];  Service: General;  Laterality: N/A;    Current Outpatient Medications  Medication Sig Dispense Refill   allopurinol  (ZYLOPRIM ) 100 MG tablet Take 2 tablets (200 mg total) by mouth daily. 180 tablet 1   Bromfenac  Sodium (PROLENSA ) 0.07 % SOLN Place 1 drop into the left eye daily.     Bromfenac  Sodium 0.07 % SOLN Place 1  drop into the left eye daily. 3 mL 4   budesonide  (RHINOCORT  AQUA) 32 MCG/ACT nasal spray Place 1 spray into both nostrils in the morning and at bedtime. 8 mL 5   Cholecalciferol  (VITAMIN D -1000 MAX ST) 25 MCG (1000 UT) tablet Take 1,000 Units by mouth daily. Patient taking twice a month     denosumab  (PROLIA ) 60 MG/ML SOLN injection Inject 60 mg into the skin every 6 (six) months. Administer in upper arm, thigh, or abdomen     Difluprednate 0.05 % EMUL Place 1 drop into the left eye 2 (two) times daily.     ELIQUIS  2.5 MG TABS tablet TAKE 1 TABLET TWO TIMES A DAY 180 tablet 3   famotidine  (PEPCID ) 20 MG tablet Take 20 mg by mouth at bedtime.      LORazepam  (ATIVAN ) 0.5 MG tablet TAKE ONE TABLET BY MOUTH ONCE DAILY AS NEEDED FOR ANXIETY 90 tablet 1   losartan (COZAAR) 25 MG tablet Take 12.5 mg by mouth daily.     Multiple Vitamin (MULTIVITAMIN) tablet Take 1 tablet by mouth every morning.     neomycin-polymyxin b-dexamethasone  (MAXITROL) 3.5-10000-0.1 OINT Place 1 Application into both eyes 3 (three) times daily.     valACYclovir  (VALTREX ) 500 MG tablet Take 1 tablet (500 mg total) by mouth 2 (two) times daily. 10 tablet 3   calcium  carbonate (OS-CAL) 600 MG TABS tablet Take 600 mg by mouth daily. (Patient not taking: Reported on 01/27/2024)     Current Facility-Administered Medications  Medication Dose Route Frequency Provider Last Rate  Last Admin   denosumab  (PROLIA ) injection 60 mg  60 mg Subcutaneous Once Plotnikov, Aleksei V, MD        ALLERGIES: 3,5-diiodothyronine; Codeine; Dilaudid  [hydromorphone  hcl]; Latrix xm [urea  in zn undecyl-lactic acid]; Pedi-pre tape spray [wound dressing adhesive]; Sulfa antibiotics; Sulfacetamide; Sulfonamide derivatives; Tape; Hydrocodone -acetaminophen ; and Norco [hydrocodone -acetaminophen ]  Family History  Problem Relation Age of Onset   Arthritis Mother    Colon cancer Mother 54   Heart disease Father    Dementia Father    Diabetes Sister    Cancer Maternal Grandfather        Lung cancer   Heart disease Maternal Grandmother    Stomach cancer Neg Hx     Review of Systems  All other systems reviewed and are negative.   PHYSICAL EXAM:  BP 114/72 (BP Location: Left Arm, Patient Position: Sitting)   Pulse 82   Ht 5' 2.5 (1.588 m)   Wt 121 lb (54.9 kg)   SpO2 99%   BMI 21.78 kg/m     General appearance: alert, cooperative and appears stated age Head: normocephalic, without obvious abnormality, atraumatic Neck: no adenopathy, supple, symmetrical, trachea midline and thyroid  normal to inspection and palpation Lungs: clear to auscultation bilaterally Breasts: normal appearance, no masses or tenderness, No nipple retraction or dimpling, No nipple discharge or bleeding, No axillary adenopathy Heart: regular rate and rhythm Abdomen: soft, non-tender; no masses, no organomegaly Extremities: extremities normal, atraumatic, no cyanosis or edema Skin: skin color, texture, turgor normal. No rashes or lesions Lymph nodes: cervical, supraclavicular, and axillary nodes normal. Neurologic: grossly normal  Pelvic: External genitalia:  no lesions              No abnormal inguinal nodes palpated.              Urethra:  normal appearing urethra with no masses, tenderness or lesions  Bartholins and Skenes: normal                 Vagina: normal appearing vagina with normal color  and discharge, no lesions              Cervix: absent              Pap taken: no Bimanual Exam:  Uterus:  absent.  Intestine palpable above vaginal apex.               Adnexa: no mass, fullness, tenderness              Rectal exam: yes.  Confirms.              Anus:  normal sphincter tone, no lesions  Chaperone was present for exam:  Kari HERO, CMA  ASSESSMENT: Encounter for breast and pelvic exam.  Personal history of other risk factors.  Status post TAH for molar pregnancy in 1970.  Benign.  Levels of hCG have been under 14.  Status post BSO in 1976.  Benign.  Hx DVT/PE following surgeries.  On Eliquis .  Osteoporosis.  Followed by PCP.  On Prolia .  FH colon cancer in mother.  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  Status post bilateral breast reduction.  Normal exam today.  Hx microscopic hematuria and renal stones.  Low GFR.  PLAN: Mammogram screening discussed. Self breast awareness reviewed. Pap and HRV collected:  no.  Not indicated.  Guidelines for Calcium , Vitamin D , regular exercise program including cardiovascular and weight bearing exercise. Medication refills:  NA She will discuss her rectal bleeding with her PCP.  She declines a referral to gastroenterology.   She declines an hCG today, which I support as she has been followed for many years without concern for choriocarcinoma.  She may want the hCG next year.  Follow up:  yearly and prn.    Additional counseling given.  yes. 25 min  total time was spent for this patient encounter, including preparation, face-to-face counseling with the patient, coordination of care, and documentation of the encounter in addition to doing the breast and pelvic exam.

## 2024-01-27 ENCOUNTER — Other Ambulatory Visit (HOSPITAL_COMMUNITY): Payer: Self-pay

## 2024-01-27 ENCOUNTER — Ambulatory Visit (INDEPENDENT_AMBULATORY_CARE_PROVIDER_SITE_OTHER): Payer: Medicare Other | Admitting: Obstetrics and Gynecology

## 2024-01-27 ENCOUNTER — Encounter: Payer: Self-pay | Admitting: Obstetrics and Gynecology

## 2024-01-27 VITALS — BP 114/72 | HR 82 | Ht 62.5 in | Wt 121.0 lb

## 2024-01-27 DIAGNOSIS — B009 Herpesviral infection, unspecified: Secondary | ICD-10-CM

## 2024-01-27 DIAGNOSIS — Z8719 Personal history of other diseases of the digestive system: Secondary | ICD-10-CM

## 2024-01-27 DIAGNOSIS — Z01419 Encounter for gynecological examination (general) (routine) without abnormal findings: Secondary | ICD-10-CM

## 2024-01-27 DIAGNOSIS — Z9189 Other specified personal risk factors, not elsewhere classified: Secondary | ICD-10-CM

## 2024-01-27 DIAGNOSIS — Z8759 Personal history of other complications of pregnancy, childbirth and the puerperium: Secondary | ICD-10-CM

## 2024-01-27 MED ORDER — BROMFENAC SODIUM 0.07 % OP SOLN
1.0000 [drp] | Freq: Every day | OPHTHALMIC | 3 refills | Status: AC
  Filled 2024-01-27: qty 3, 60d supply, fill #0
  Filled 2024-03-14 – 2024-03-15 (×2): qty 3, 60d supply, fill #1

## 2024-01-27 NOTE — Patient Instructions (Signed)

## 2024-01-28 ENCOUNTER — Other Ambulatory Visit (HOSPITAL_COMMUNITY): Payer: Self-pay

## 2024-01-29 ENCOUNTER — Ambulatory Visit (INDEPENDENT_AMBULATORY_CARE_PROVIDER_SITE_OTHER)

## 2024-01-29 ENCOUNTER — Other Ambulatory Visit: Payer: Self-pay | Admitting: Internal Medicine

## 2024-01-29 DIAGNOSIS — M810 Age-related osteoporosis without current pathological fracture: Secondary | ICD-10-CM

## 2024-01-29 MED ORDER — DENOSUMAB 60 MG/ML ~~LOC~~ SOSY: 60.0000 mg | PREFILLED_SYRINGE | Freq: Once | SUBCUTANEOUS | 0 refills | Status: AC

## 2024-01-29 NOTE — Progress Notes (Addendum)
 Patient received prolia  injection today, tolerated well.   Medical screening examination/treatment/procedure(s) were performed by non-physician practitioner and as supervising physician I was immediately available for consultation/collaboration.  I agree with above. Karlynn Noel, MD

## 2024-01-30 ENCOUNTER — Encounter: Payer: Self-pay | Admitting: Internal Medicine

## 2024-02-06 DIAGNOSIS — R059 Cough, unspecified: Secondary | ICD-10-CM | POA: Diagnosis not present

## 2024-02-06 DIAGNOSIS — J069 Acute upper respiratory infection, unspecified: Secondary | ICD-10-CM | POA: Diagnosis not present

## 2024-02-06 DIAGNOSIS — U071 COVID-19: Secondary | ICD-10-CM | POA: Diagnosis not present

## 2024-02-06 DIAGNOSIS — J029 Acute pharyngitis, unspecified: Secondary | ICD-10-CM | POA: Diagnosis not present

## 2024-02-06 DIAGNOSIS — Z882 Allergy status to sulfonamides status: Secondary | ICD-10-CM | POA: Diagnosis not present

## 2024-02-06 DIAGNOSIS — Z86711 Personal history of pulmonary embolism: Secondary | ICD-10-CM | POA: Diagnosis not present

## 2024-02-06 DIAGNOSIS — Z885 Allergy status to narcotic agent status: Secondary | ICD-10-CM | POA: Diagnosis not present

## 2024-02-06 DIAGNOSIS — R519 Headache, unspecified: Secondary | ICD-10-CM | POA: Diagnosis not present

## 2024-02-08 ENCOUNTER — Encounter: Payer: Self-pay | Admitting: Internal Medicine

## 2024-02-15 ENCOUNTER — Emergency Department (HOSPITAL_COMMUNITY)
Admission: EM | Admit: 2024-02-15 | Discharge: 2024-02-15 | Disposition: A | Discharge status: Home / Self Care | Attending: Emergency Medicine | Admitting: Emergency Medicine

## 2024-02-15 ENCOUNTER — Emergency Department (HOSPITAL_COMMUNITY)

## 2024-02-15 ENCOUNTER — Encounter (HOSPITAL_COMMUNITY): Payer: Self-pay

## 2024-02-15 DIAGNOSIS — U071 COVID-19: Secondary | ICD-10-CM | POA: Diagnosis not present

## 2024-02-15 DIAGNOSIS — R059 Cough, unspecified: Secondary | ICD-10-CM | POA: Diagnosis not present

## 2024-02-15 DIAGNOSIS — E860 Dehydration: Secondary | ICD-10-CM | POA: Insufficient documentation

## 2024-02-15 DIAGNOSIS — R5383 Other fatigue: Secondary | ICD-10-CM

## 2024-02-15 DIAGNOSIS — J9811 Atelectasis: Secondary | ICD-10-CM | POA: Diagnosis not present

## 2024-02-15 DIAGNOSIS — R0602 Shortness of breath: Secondary | ICD-10-CM | POA: Diagnosis not present

## 2024-02-15 LAB — BASIC METABOLIC PANEL WITH GFR
Anion gap: 12 (ref 5–15)
BUN: 42 mg/dL — ABNORMAL HIGH (ref 8–23)
CO2: 25 mmol/L (ref 22–32)
Calcium: 10.7 mg/dL — ABNORMAL HIGH (ref 8.9–10.3)
Chloride: 103 mmol/L (ref 98–111)
Creatinine, Ser: 1.51 mg/dL — ABNORMAL HIGH (ref 0.44–1.00)
GFR, Estimated: 34 mL/min — ABNORMAL LOW (ref >60)
Glucose, Bld: 190 mg/dL — ABNORMAL HIGH (ref 70–99)
Potassium: 4.1 mmol/L (ref 3.5–5.1)
Sodium: 140 mmol/L (ref 135–145)

## 2024-02-15 LAB — CBC
HCT: 41 % (ref 36.0–46.0)
Hemoglobin: 12.9 g/dL (ref 12.0–15.0)
MCH: 29.9 pg (ref 26.0–34.0)
MCHC: 31.5 g/dL (ref 30.0–36.0)
MCV: 94.9 fL (ref 80.0–100.0)
Platelets: 356 K/uL (ref 150–400)
RBC: 4.32 MIL/uL (ref 3.87–5.11)
RDW: 14.3 % (ref 11.5–15.5)
WBC: 13 K/uL — ABNORMAL HIGH (ref 4.0–10.5)
nRBC: 0 % (ref 0.0–0.2)

## 2024-02-15 MED ORDER — DEXAMETHASONE SOD PHOSPHATE PF 10 MG/ML IJ SOLN
10.0000 mg | Freq: Once | INTRAMUSCULAR | Status: AC
  Administered 2024-02-15: 10 mg via INTRAVENOUS

## 2024-02-15 MED ORDER — SODIUM CHLORIDE 0.9 % IV BOLUS
1000.0000 mL | Freq: Once | INTRAVENOUS | Status: AC
  Administered 2024-02-15: 1000 mL via INTRAVENOUS

## 2024-02-15 MED ORDER — AZITHROMYCIN 250 MG PO TABS: 250.0000 mg | ORAL_TABLET | Freq: Every day | ORAL | 0 refills | Status: AC

## 2024-02-15 MED ORDER — IOHEXOL 350 MG/ML SOLN
60.0000 mL | Freq: Once | INTRAVENOUS | Status: AC | PRN
  Administered 2024-02-15: 60 mL via INTRAVENOUS

## 2024-02-15 NOTE — Discharge Instructions (Addendum)
 Continue hydration at home.  I would follow-up with your primary care doctor and have them recheck your kidney function next week or the week after that make sure that you are hydrating well.  Return if symptoms worsen.  Start antibiotics tomorrow.  I have treated you with a long-acting steroid that should help with your symptoms as well.

## 2024-02-15 NOTE — ED Provider Notes (Signed)
 [place] EMERGENCY DEPARTMENT AT [facility] Provider Note   CSN: [id number] Arrival date & time: 02/15/24  1304     Patient presents with: Tachycardia and Fatigue   Kristen Castillo is a 83 y.o. female.   Patient here with concern for pneumonia.  Had COVID about 10 days ago still with cough shortness of breath at times.  Feels tachycardic at times.  Oxygen may be low at times.  She denies any smoking history.  She just finished her course of steroids.  She denies any smoking history.  She is on anticoagulation for blood clot history.  She has been compliant with these medications.  She just traveled from [place]  as well.  She denies any chest pain fever chills.  Denies any abdominal pain nausea vomit diarrhea.  She feels dehydrated.  The history is provided by the patient.       Prior to Admission medications   Medication Sig Start Date End Date Taking? Authorizing Provider  azithromycin  (ZITHROMAX ) 250 MG tablet Take 1 tablet (250 mg total) by mouth daily. Take first 2 tablets together, then 1 every day until finished. 02/15/24  Yes Roselin Wiemann, DO  allopurinol  (ZYLOPRIM ) 100 MG tablet Take 2 tablets (200 mg total) by mouth daily. 10/24/22   Plotnikov, Karlynn GAILS, MD  Bromfenac  Sodium (PROLENSA ) 0.07 % SOLN Place 1 drop into the left eye daily.    [provider]  Bromfenac  Sodium (PROLENSA ) 0.07 % SOLN Place 1 drop into the left eye daily. 01/27/24     Bromfenac  Sodium 0.07 % SOLN Place 1 drop into the left eye daily. 12/04/22     budesonide  (RHINOCORT  AQUA) 32 MCG/ACT nasal spray Place 1 spray into both nostrils in the morning and at bedtime. 10/23/21   Plotnikov, Aleksei V, MD  calcium  carbonate (OS-CAL) 600 MG TABS tablet Take 600 mg by mouth daily. Patient not taking: Reported on 01/27/2024    [provider]  Cholecalciferol  (VITAMIN D -1000 MAX ST) 25 MCG (1000 UT) tablet Take 1,000 Units by mouth daily. Patient taking twice a month    [provider]  denosumab  (PROLIA ) 60 MG/ML SOLN injection Inject 60 mg into the skin every 6 (six) months. Administer in upper arm, thigh, or abdomen    [provider]  denosumab  (PROLIA ) 60 MG/ML SOSY injection Inject 60 mg into the skin once for 1 dose. 07/28/24 07/28/24  Plotnikov, Aleksei V, MD  Difluprednate 0.05 % EMUL Place 1 drop into the left eye 2 (two) times daily. 05/17/20   [provider]  ELIQUIS  2.5 MG TABS tablet TAKE 1 TABLET TWO TIMES A DAY 01/12/24   Plotnikov, Aleksei V, MD  famotidine  (PEPCID ) 20 MG tablet Take 20 mg by mouth at bedtime.     [provider]  LORazepam  (ATIVAN ) 0.5 MG tablet TAKE ONE TABLET BY MOUTH ONCE DAILY AS NEEDED FOR ANXIETY 12/26/23   Plotnikov, Aleksei V, MD  losartan (COZAAR) 25 MG tablet Take 12.5 mg by mouth daily. 04/11/22   [provider]  Multiple Vitamin (MULTIVITAMIN) tablet Take 1 tablet by mouth every morning.    [provider]  neomycin-polymyxin b-dexamethasone  (MAXITROL) 3.5-10000-0.1 OINT Place 1 Application into both eyes 3 (three) times daily. 05/05/23   [provider]  valACYclovir  (VALTREX ) 500 MG tablet Take 1 tablet (500 mg total) by mouth 2 (two) times daily. 11/24/23   Plotnikov, Aleksei V, MD    Allergies: 3,5-diiodothyronine; Codeine; Dilaudid  [hydromorphone  hcl]; Latrix xm [urea  in  zn undecyl-lactic acid]; Pedi-pre tape spray [wound dressing adhesive]; Sulfa antibiotics; Sulfacetamide; Sulfonamide derivatives; Tape; Hydrocodone -acetaminophen ; and Norco [hydrocodone -acetaminophen ]    Review of Systems  Updated Vital Signs BP (!) 180/75 (BP Location: Left Arm)   Pulse 89   Temp 98.5 F (36.9 C) (Oral)   Resp 19   SpO2 96%   Physical Exam Vitals and nursing note reviewed.  Constitutional:      General: She is not in acute distress.    Appearance: She is well-developed. She is not ill-appearing.  HENT:     Head: Normocephalic and atraumatic.     Nose: Nose normal.      Mouth/Throat:     Mouth: Mucous membranes are moist.  Eyes:     Extraocular Movements: Extraocular movements intact.     Conjunctiva/sclera: Conjunctivae normal.     Pupils: Pupils are equal, round, and reactive to light.  Cardiovascular:     Rate and Rhythm: Normal rate and regular rhythm.     Pulses: Normal pulses.     Heart sounds: Normal heart sounds. No murmur heard. Pulmonary:     Effort: Pulmonary effort is normal. No respiratory distress.     Comments: Coarse breath sounds but no respiratory distress Abdominal:     Palpations: Abdomen is soft.     Tenderness: There is no abdominal tenderness.  Musculoskeletal:        General: No swelling.     Cervical back: Normal range of motion and neck supple.  Skin:    General: Skin is warm and dry.     Capillary Refill: Capillary refill takes less than 2 seconds.  Neurological:     General: No focal deficit present.     Mental Status: She is alert and oriented to person, place, and time.  Psychiatric:        Mood and Affect: Mood normal.     (all labs ordered are listed, but only abnormal results are displayed) Labs Reviewed  BASIC METABOLIC PANEL WITH GFR - Abnormal; Notable for the following components:      Result Value   Glucose, Bld 190 (*)    BUN 42 (*)    Creatinine, Ser 1.51 (*)    Calcium  10.7 (*)    GFR, Estimated 34 (*)    All other components within normal limits  CBC - Abnormal; Notable for the following components:   WBC 13.0 (*)    All other components within normal limits    EKG: None  Radiology: CT Angio Chest PE W and/or Wo Contrast Result Date: 02/15/2024 CLINICAL DATA:  Pulmonary embolism suspected, high probability. EXAM: CT ANGIOGRAPHY CHEST WITH CONTRAST TECHNIQUE: Multidetector CT imaging of the chest was performed using the standard protocol during bolus administration of intravenous contrast. Multiplanar CT image reconstructions and MIPs were obtained to evaluate the vascular anatomy. RADIATION  DOSE REDUCTION: This exam was performed according to the departmental dose-optimization program which includes automated exposure control, adjustment of the mA and/or kV according to patient size and/or use of iterative reconstruction technique. CONTRAST:  60mL OMNIPAQUE  IOHEXOL  350 MG/ML SOLN COMPARISON:  08/23/2015. FINDINGS: Cardiovascular: Heart is normal in size and there is no significant pericardial effusion. There is atherosclerotic calcification of the aorta without evidence of aneurysm. Pulmonary trunk is normal in caliber. No evidence of pulmonary embolism is seen. Mediastinum/Nodes: No mediastinal, hilar, or axillary lymph nodes are seen. There is a 9 mm hypodense nodule in the left lobe of the thyroid  gland. No additional imaging is recommended. The  trachea and esophagus are within normal limits. There is a small hiatal hernia. Lungs/Pleura: Chronic elevation of the right diaphragm is noted. Atelectasis is present bilaterally. No effusion or pneumothorax is seen. Upper Abdomen: There is a cyst in the right lobe of the liver. A few tiny stones are noted in the gallbladder. No acute abnormality. Musculoskeletal: Degenerative changes are present in the thoracic spine. No acute osseous abnormality is seen. Review of the MIP images confirms the above findings. IMPRESSION: 1. No evidence of pulmonary embolism or other acute process. 2. Chronic elevation of the right diaphragm with atelectasis. 3. Small hiatal hernia. 4. Aortic atherosclerosis. Electronically Signed   By: Leita Birmingham M.D.   On: 02/15/2024 17:41   DG Chest 2 View Result Date: 02/15/2024 CLINICAL DATA:  Shortness of breath. EXAM: CHEST - 2 VIEW COMPARISON:  08/21/2022 FINDINGS: The cardiac silhouette, mediastinal and hilar contours are within normal limits and stable. Progressive eventration of the right hemidiaphragm with overlying vascular crowding and atelectasis. No definite infiltrates, edema or effusions. No pulmonary lesions. No  pneumothorax. The bony thorax is intact. IMPRESSION: Progressive eventration of the right hemidiaphragm with overlying vascular crowding and atelectasis. Electronically Signed   By: MYRTIS Stammer M.D.   On: 02/15/2024 14:21     Procedures   Medications Ordered in the ED  dexamethasone  (DECADRON ) injection 10 mg (has no administration in time range)  sodium chloride  0.9 % bolus 1,000 mL (1,000 mLs Intravenous New Bag/Given 02/15/24 1655)  iohexol  (OMNIPAQUE ) 350 MG/ML injection 60 mL (60 mLs Intravenous Contrast Given 02/15/24 1717)                                    Medical Decision Making Amount and/or Complexity of Data Reviewed Labs: ordered. Radiology: ordered.  Risk Prescription drug management.   [place] Roa is here with cough shortness of breath fatigue concern for dehydration.  She is mildly tachycardic but otherwise normal vitals.  COVID about 10 days ago.  She is on anticoagulation for blood clot history.  She is well-appearing.  Little bit of coarse breath sounds on exam.  I do suspect may be some post COVID inflammatory process or infection/pneumonia seems less likely to be PE given that she is anticoagulated but will check basic labs chest x-ray and consider CT scan of her chest.  Will give IV fluids.  Lab work overall is unremarkable.  Mild leukocytosis but no significant anemia.  Creatinine is mildly elevated at 1.5.  GFR is 34.  Will give IV fluids will get a CT scan of her chest to further evaluate for PE.  Chest x-ray does not show any obvious pneumonia edema or effusions.  Will get CT scan to get further details of pneumonia/maybe PE.  Overall patient is feeling better after IV fluids.  Will give her a dose of IV Decadron  for further symptomatic support of likely bronchitis/inflammatory process.  CT scan showed no evidence of pulmonary embolism or other acute process.  Will do Z-Pak as well just to be conservative.  Will have her follow-up with primary care.   Overall I suspect she is recovering from COVID and should hopefully continue to do well.  Follow-up with primary care.  Return if symptoms worsen.  This chart was dictated using voice recognition software.  Despite best efforts to proofread,  errors can occur which can change the documentation meaning.      Final diagnoses:  Other fatigue  Dehydration  COVID-19    ED Discharge Orders          Ordered    azithromycin  (ZITHROMAX ) 250 MG tablet  Daily        02/15/24 1802               Ruthe Cornet, DO 02/15/24 1802

## 2024-02-15 NOTE — ED Triage Notes (Signed)
 Pt came in for weakness and a low oxygen rate. Pt stated she just had covid and was told she was not contagious anymore on 02/06/2024. However, patient still feels weak and wants to make sure she does not have pneumonia.

## 2024-02-18 ENCOUNTER — Encounter: Payer: Self-pay | Admitting: Internal Medicine

## 2024-02-18 ENCOUNTER — Ambulatory Visit: Admitting: Internal Medicine

## 2024-02-18 VITALS — BP 122/90 | HR 73 | Ht 62.5 in | Wt 125.8 lb

## 2024-02-18 DIAGNOSIS — R053 Chronic cough: Secondary | ICD-10-CM | POA: Diagnosis not present

## 2024-02-18 DIAGNOSIS — R739 Hyperglycemia, unspecified: Secondary | ICD-10-CM

## 2024-02-18 DIAGNOSIS — U099 Post covid-19 condition, unspecified: Secondary | ICD-10-CM | POA: Diagnosis not present

## 2024-02-18 DIAGNOSIS — N2889 Other specified disorders of kidney and ureter: Secondary | ICD-10-CM

## 2024-02-18 DIAGNOSIS — G9332 Myalgic encephalomyelitis/chronic fatigue syndrome: Secondary | ICD-10-CM | POA: Diagnosis not present

## 2024-02-18 DIAGNOSIS — R202 Paresthesia of skin: Secondary | ICD-10-CM

## 2024-02-18 DIAGNOSIS — F419 Anxiety disorder, unspecified: Secondary | ICD-10-CM

## 2024-02-18 MED ORDER — PROMETHAZINE-DM 6.25-15 MG/5ML PO SYRP: 5.0000 mL | ORAL_SOLUTION | Freq: Four times a day (QID) | ORAL | 1 refills | Status: DC | PRN

## 2024-02-18 NOTE — Assessment & Plan Note (Addendum)
 Cough and fatigue Tessalon , Prom cough syrup Rest more

## 2024-02-18 NOTE — Progress Notes (Unsigned)
 I have not or all already got her here while I am yeah yeah right but living yeah okay you had altered but did you need yes so that did not message to see  Subjective:  Patient ID: Kristen Castillo, female    DOB: Jul 03, 1940  Age: 83 y.o. MRN: [id number]  CC: Hospitalization Follow-up [facility] follow up 12/07 for lingering covid symptoms. Fatigue, cough, metalic taste)   HPI Kristen Castillo presents for COVID over Thanksgiving - treated w/prednisone . On 12/7 went to ER - Z pac was given. CT angio was (-) C/o fatigue, metallic taste, cough, low GFR, elevated glucose  Outpatient Medications Prior to Visit  Medication Sig Dispense Refill   allopurinol  (ZYLOPRIM ) 100 MG tablet Take 2 tablets (200 mg total) by mouth daily. 180 tablet 1   azithromycin  (ZITHROMAX ) 250 MG tablet Take 1 tablet (250 mg total) by mouth daily. Take first 2 tablets together, then 1 every day until finished. 6 tablet 0   Bromfenac  Sodium (PROLENSA ) 0.07 % SOLN Place 1 drop into the left eye daily.     Bromfenac  Sodium (PROLENSA ) 0.07 % SOLN Place 1 drop into the left eye daily. 3 mL 3   Bromfenac  Sodium 0.07 % SOLN Place 1 drop into the left eye daily. 3 mL 4   budesonide  (RHINOCORT  AQUA) 32 MCG/ACT nasal spray Place 1 spray into both nostrils in the morning and at bedtime. 8 mL 5   Cholecalciferol  (VITAMIN D -1000 MAX ST) 25 MCG (1000 UT) tablet Take 1,000 Units by mouth daily. Patient taking twice a month     denosumab  (PROLIA ) 60 MG/ML SOLN injection Inject 60 mg into the skin every 6 (six) months. Administer in upper arm, thigh, or abdomen     [START ON 07/28/2024] denosumab  (PROLIA ) 60 MG/ML SOSY injection Inject 60 mg into the skin once for 1 dose. 1 mL 0   Difluprednate 0.05 % EMUL Place 1 drop into the left eye 2 (two) times daily.     ELIQUIS  2.5 MG TABS tablet TAKE 1 TABLET TWO TIMES A DAY 180 tablet 3   famotidine  (PEPCID ) 20 MG tablet Take 20 mg by mouth at bedtime.      LORazepam  (ATIVAN ) 0.5 MG tablet  TAKE ONE TABLET BY MOUTH ONCE DAILY AS NEEDED FOR ANXIETY 90 tablet 1   losartan (COZAAR) 25 MG tablet Take 12.5 mg by mouth daily.     Multiple Vitamin (MULTIVITAMIN) tablet Take 1 tablet by mouth every morning.     neomycin-polymyxin b-dexamethasone  (MAXITROL) 3.5-10000-0.1 OINT Place 1 Application into both eyes 3 (three) times daily.     valACYclovir  (VALTREX ) 500 MG tablet Take 1 tablet (500 mg total) by mouth 2 (two) times daily. 10 tablet 3   calcium  carbonate (OS-CAL) 600 MG TABS tablet Take 600 mg by mouth daily. (Patient not taking: Reported on 02/18/2024)     No facility-administered medications prior to visit.    ROS: Review of Systems  Constitutional:  Positive for fatigue. Negative for activity change, appetite change, chills and unexpected weight change.  HENT:  Negative for congestion, mouth sores and sinus pressure.   Eyes:  Negative for visual disturbance.  Respiratory:  Positive for cough. Negative for chest tightness.   Gastrointestinal:  Negative for abdominal pain and nausea.  Genitourinary:  Negative for difficulty urinating, frequency and vaginal pain.  Musculoskeletal:  Negative for back pain and gait problem.  Skin:  Negative for pallor and rash.  Neurological:  Positive for weakness. Negative for dizziness,  tremors, numbness and headaches.  Psychiatric/Behavioral:  Negative for confusion, sleep disturbance and suicidal ideas. The patient is nervous/anxious.     Objective:  BP (!) 122/90   Pulse 73   Ht 5' 2.5 (1.588 m)   Wt 125 lb 12.8 oz (57.1 kg)   SpO2 97%   BMI 22.64 kg/m   BP Readings from Last 3 Encounters:  02/18/24 (!) 122/90  02/15/24 (!) 179/75  01/27/24 114/72    Wt Readings from Last 3 Encounters:  02/18/24 125 lb 12.8 oz (57.1 kg)  01/27/24 121 lb (54.9 kg)  11/24/23 123 lb 12.8 oz (56.2 kg)    Physical Exam Constitutional:      General: She is not in acute distress.    Appearance: She is well-developed. She is obese.  HENT:      Head: Normocephalic.     Right Ear: External ear normal.     Left Ear: External ear normal.     Nose: Nose normal.  Eyes:     General:        Right eye: No discharge.        Left eye: No discharge.     Conjunctiva/sclera: Conjunctivae normal.     Pupils: Pupils are equal, round, and reactive to light.  Neck:     Thyroid : No thyromegaly.     Vascular: No JVD.     Trachea: No tracheal deviation.  Cardiovascular:     Rate and Rhythm: Normal rate and regular rhythm.     Heart sounds: Normal heart sounds.  Pulmonary:     Effort: No respiratory distress.     Breath sounds: No stridor. No wheezing.  Abdominal:     General: Bowel sounds are normal. There is no distension.     Palpations: Abdomen is soft. There is no mass.     Tenderness: There is no abdominal tenderness. There is no guarding or rebound.  Musculoskeletal:        General: No tenderness.     Cervical back: Normal range of motion and neck supple. No rigidity.  Lymphadenopathy:     Cervical: No cervical adenopathy.  Skin:    Findings: No erythema or rash.  Neurological:     Cranial Nerves: No cranial nerve deficit.     Motor: No abnormal muscle tone.     Coordination: Coordination normal.     Deep Tendon Reflexes: Reflexes normal.  Psychiatric:        Behavior: Behavior normal.        Thought Content: Thought content normal.        Judgment: Judgment normal.     Lab Results  Component Value Date   WBC 13.0 (H) 02/15/2024   HGB 12.9 02/15/2024   HCT 41.0 02/15/2024   PLT 356 02/15/2024   GLUCOSE 190 (H) 02/15/2024   CHOL 255 (H) 01/17/2021   TRIG 231.0 (H) 01/17/2021   HDL 69.10 01/17/2021   LDLDIRECT 135.0 01/17/2021   LDLCALC 117 (H) 11/03/2018   ALT 10 04/18/2022   AST 16 04/18/2022   NA 140 02/15/2024   K 4.1 02/15/2024   CL 103 02/15/2024   CREATININE 1.51 (H) 02/15/2024   BUN 42 (H) 02/15/2024   CO2 25 02/15/2024   TSH 4.99 01/17/2021   INR 1.3 (H) 05/11/2019   HGBA1C 6.2 04/18/2022    CT  Angio Chest PE W and/or Wo Contrast Result Date: 02/15/2024 CLINICAL DATA:  Pulmonary embolism suspected, high probability. EXAM: CT ANGIOGRAPHY CHEST WITH CONTRAST TECHNIQUE: Multidetector  CT imaging of the chest was performed using the standard protocol during bolus administration of intravenous contrast. Multiplanar CT image reconstructions and MIPs were obtained to evaluate the vascular anatomy. RADIATION DOSE REDUCTION: This exam was performed according to the departmental dose-optimization program which includes automated exposure control, adjustment of the mA and/or kV according to patient size and/or use of iterative reconstruction technique. CONTRAST:  60mL OMNIPAQUE  IOHEXOL  350 MG/ML SOLN COMPARISON:  08/23/2015. FINDINGS: Cardiovascular: Heart is normal in size and there is no significant pericardial effusion. There is atherosclerotic calcification of the aorta without evidence of aneurysm. Pulmonary trunk is normal in caliber. No evidence of pulmonary embolism is seen. Mediastinum/Nodes: No mediastinal, hilar, or axillary lymph nodes are seen. There is a 9 mm hypodense nodule in the left lobe of the thyroid  gland. No additional imaging is recommended. The trachea and esophagus are within normal limits. There is a small hiatal hernia. Lungs/Pleura: Chronic elevation of the right diaphragm is noted. Atelectasis is present bilaterally. No effusion or pneumothorax is seen. Upper Abdomen: There is a cyst in the right lobe of the liver. A few tiny stones are noted in the gallbladder. No acute abnormality. Musculoskeletal: Degenerative changes are present in the thoracic spine. No acute osseous abnormality is seen. Review of the MIP images confirms the above findings. IMPRESSION: 1. No evidence of pulmonary embolism or other acute process. 2. Chronic elevation of the right diaphragm with atelectasis. 3. Small hiatal hernia. 4. Aortic atherosclerosis. Electronically Signed   By: Leita Birmingham M.D.   On:  02/15/2024 17:41   DG Chest 2 View Result Date: 02/15/2024 CLINICAL DATA:  Shortness of breath. EXAM: CHEST - 2 VIEW COMPARISON:  08/21/2022 FINDINGS: The cardiac silhouette, mediastinal and hilar contours are within normal limits and stable. Progressive eventration of the right hemidiaphragm with overlying vascular crowding and atelectasis. No definite infiltrates, edema or effusions. No pulmonary lesions. No pneumothorax. The bony thorax is intact. IMPRESSION: Progressive eventration of the right hemidiaphragm with overlying vascular crowding and atelectasis. Electronically Signed   By: MYRTIS Stammer M.D.   On: 02/15/2024 14:21    Assessment & Plan:   Problem List Items Addressed This Visit     Anxiety, mild   Cont w/Lorazepam  prn  Potential benefits of a long term benzodiazepines  use as well as potential risks  and complications were explained to the patient and were aknowledged.      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 On 12/7 went to ER - Z pac was given. CT angio was (-) C/o fatigue, metallic taste, cough, low GFR, elevated glucose      CRI (chronic renal insufficiency)   Hyperglycemia   Post-COVID chronic fatigue - Primary   Cough and fatigue Tessalon , Prom cough syrup Rest more      Relevant Orders   Comprehensive metabolic panel with GFR   Vitamin B12   Hemoglobin A1c   T4, free   TSH   Other Visit Diagnoses       Paresthesia       Relevant Orders   T4, free   TSH         Meds ordered this encounter  Medications   promethazine -dextromethorphan (PROMETHAZINE -DM) 6.25-15 MG/5ML syrup    Sig: Take 5 mLs by mouth 4 (four) times daily as needed for cough.    Dispense:  240 mL    Refill:  1      Follow-up: No follow-ups on file.  Marolyn Noel, MD

## 2024-02-18 NOTE — Assessment & Plan Note (Signed)
 Post-COVID COVID over Thanksgiving - treated w/prednisone . On 12/7 went to ER - Z pac was given. CT angio was (-) C/o fatigue, metallic taste, cough, low GFR, elevated glucose

## 2024-02-23 ENCOUNTER — Ambulatory Visit: Admitting: Psychology

## 2024-02-23 DIAGNOSIS — F4320 Adjustment disorder, unspecified: Secondary | ICD-10-CM | POA: Diagnosis not present

## 2024-02-23 DIAGNOSIS — F4322 Adjustment disorder with anxiety: Secondary | ICD-10-CM

## 2024-02-23 NOTE — Progress Notes (Signed)
° ° ° ° ° ° ° ° ° ° ° ° ° ° ° ° ° ° ° ° ° ° ° ° ° ° ° ° ° ° ° ° ° ° ° ° ° ° ° ° ° ° ° ° °  02/23/2024   Diagnosis Adjustment Disorder Symptoms Anxiety, chronic worry, insecurity Medication Status No psychotropics Safety N/A If Suicidal or Homicidal State Action Taken: N/A Current Risk:  Medications None Objectives Reduce symptoms of anxiety and chronic worry Client Response full compliance  Service Location Location, [address] [place], [place] [address]  Service Code cpt [phone]  Behavioral activation plan  Identify automatic thoughts  Rationally challenge thoughts or beliefs/cognitive restructuring  Identify/label emotions  Validate/empathize  Provide education, information  Self care activities  Session Notes: Dx.: Adjustment Disorder  Meds.: No psychotropics  Goals: Son and his wife are divorced. She is very close with grandchildren and experiences considerable stress associated with ex-daughter in law's alienation. She is frequently fearful and tearful that her grandchildren will be influenced by their mother and it will damage their relationship. She is seeking counseling to reduce fears and symptoms. Therapy sessions will be on a as needed basis. Goal date is 12-26.   Patient agrees to an audio session (due to lack of technology comfort) and is aware of the limitations of this platform. They are at home and I am at my home office.  Kristen Castillo (and spouse Kristen Castillo): They express considerable concern about grandson Kristen Castillo. He has been much less involved with friends and has pulled back socially. He does not appear distressed by his lack of social involvement. He does not, however, appear to be upset by his lack of social involvement. We talked about their level of involvement and the best strategy to be supportive without being intrusive. They will talk with son about talking with doctor about growth concerns. They will also wait to observe how Kristen Castillo reacts to his transition  to high school. Talked about warning signs.   Session notes: Kristen Castillo and Kristen Castillo)- Their 7 year old grandson Kristen Castillo is having some problems. He got in trouble at a youth organization last year and his punishment is he cannot sleep over with the group. He has been distant from the youth group for past year. He is now looking to get back involved with the group. They talked to the Rabbi and they are concerned that he now has a reputation that is negative. We discussed how to proceed and suggested that they have a conversation with the Rabbi and then have him meet with Kristen Castillo. They will move forward with the plan and get back in touch if they need further assistance. Doing a good job of maintaining their boundaries.              CONI ALM KERNS, PhD Time 9:40a-10:30a 50 min.

## 2024-02-24 DIAGNOSIS — K080 Exfoliation of teeth due to systemic causes: Secondary | ICD-10-CM | POA: Diagnosis not present

## 2024-02-24 NOTE — Assessment & Plan Note (Signed)
Cont w/Lorazepam prn  Potential benefits of a long term benzodiazepines  use as well as potential risks  and complications were explained to the patient and were aknowledged. 

## 2024-02-25 ENCOUNTER — Other Ambulatory Visit (INDEPENDENT_AMBULATORY_CARE_PROVIDER_SITE_OTHER)

## 2024-02-25 DIAGNOSIS — U099 Post covid-19 condition, unspecified: Secondary | ICD-10-CM | POA: Diagnosis not present

## 2024-02-25 DIAGNOSIS — R202 Paresthesia of skin: Secondary | ICD-10-CM

## 2024-02-25 DIAGNOSIS — N390 Urinary tract infection, site not specified: Secondary | ICD-10-CM | POA: Diagnosis not present

## 2024-02-25 DIAGNOSIS — G9332 Myalgic encephalomyelitis/chronic fatigue syndrome: Secondary | ICD-10-CM

## 2024-02-25 LAB — TSH: TSH: 6.02 u[IU]/mL — ABNORMAL HIGH (ref 0.35–5.50)

## 2024-02-25 LAB — COMPREHENSIVE METABOLIC PANEL WITH GFR
ALT: 10 U/L (ref 3–35)
AST: 15 U/L (ref 5–37)
Albumin: 3.7 g/dL (ref 3.5–5.2)
Alkaline Phosphatase: 92 U/L (ref 39–117)
BUN: 36 mg/dL — ABNORMAL HIGH (ref 6–23)
CO2: 28 meq/L (ref 19–32)
Calcium: 9.2 mg/dL (ref 8.4–10.5)
Chloride: 106 meq/L (ref 96–112)
Creatinine, Ser: 1.38 mg/dL — ABNORMAL HIGH (ref 0.40–1.20)
GFR: 35.47 mL/min — ABNORMAL LOW (ref >60.00)
Glucose, Bld: 103 mg/dL — ABNORMAL HIGH (ref 70–99)
Potassium: 3.6 meq/L (ref 3.5–5.1)
Sodium: 141 meq/L (ref 135–145)
Total Bilirubin: 0.5 mg/dL (ref 0.2–1.2)
Total Protein: 7.4 g/dL (ref 6.0–8.3)

## 2024-02-25 LAB — VITAMIN B12: Vitamin B-12: 658 pg/mL (ref 211–911)

## 2024-02-25 LAB — T4, FREE: Free T4: 0.82 ng/dL (ref 0.60–1.60)

## 2024-02-25 LAB — URINALYSIS, ROUTINE W REFLEX MICROSCOPIC
Bilirubin Urine: NEGATIVE
Hgb urine dipstick: NEGATIVE
Ketones, ur: NEGATIVE
Leukocytes,Ua: NEGATIVE
Nitrite: NEGATIVE
Specific Gravity, Urine: 1.025 (ref 1.000–1.030)
Total Protein, Urine: >=300 — AB
Urine Glucose: NEGATIVE
Urobilinogen, UA: 0.2 (ref 0.0–1.0)
pH: 6 (ref 5.0–8.0)

## 2024-02-25 LAB — HEMOGLOBIN A1C: Hgb A1c MFr Bld: 6.5 % (ref 4.6–6.5)

## 2024-02-26 ENCOUNTER — Ambulatory Visit: Payer: Self-pay | Admitting: Internal Medicine

## 2024-02-27 LAB — CULTURE, URINE COMPREHENSIVE: RESULT:: NO GROWTH

## 2024-03-09 ENCOUNTER — Encounter: Payer: Self-pay | Admitting: Internal Medicine

## 2024-03-10 ENCOUNTER — Other Ambulatory Visit: Payer: Self-pay | Admitting: Internal Medicine

## 2024-03-10 MED ORDER — ALBUTEROL SULFATE HFA 108 (90 BASE) MCG/ACT IN AERS: 2.0000 | INHALATION_SPRAY | RESPIRATORY_TRACT | 6 refills | Status: AC | PRN

## 2024-03-10 NOTE — Progress Notes (Signed)
 Asked for an inhaler. Rx emailed. OV if not better

## 2024-03-14 ENCOUNTER — Other Ambulatory Visit (HOSPITAL_COMMUNITY): Payer: Self-pay

## 2024-03-15 ENCOUNTER — Other Ambulatory Visit: Payer: Self-pay

## 2024-03-15 ENCOUNTER — Other Ambulatory Visit (HOSPITAL_COMMUNITY): Payer: Self-pay

## 2024-03-16 ENCOUNTER — Other Ambulatory Visit (HOSPITAL_COMMUNITY): Payer: Self-pay

## 2024-03-17 ENCOUNTER — Other Ambulatory Visit: Payer: Self-pay

## 2024-03-17 ENCOUNTER — Other Ambulatory Visit (HOSPITAL_COMMUNITY): Payer: Self-pay

## 2024-11-05 ENCOUNTER — Ambulatory Visit

## 2025-01-31 ENCOUNTER — Encounter: Admitting: Obstetrics and Gynecology

## 2025-02-08 ENCOUNTER — Encounter: Admitting: Obstetrics and Gynecology
